# Patient Record
Sex: Female | Born: 1935 | Race: White | Hispanic: No | Smoking: Never smoker
Health system: Southern US, Community
[De-identification: ages and names within clinical notes are randomized; demographics above are authoritative.]

## PROBLEM LIST (undated history)

## (undated) DIAGNOSIS — M25519 Pain in unspecified shoulder: Secondary | ICD-10-CM

## (undated) DIAGNOSIS — G8929 Other chronic pain: Secondary | ICD-10-CM

## (undated) DIAGNOSIS — IMO0001 Reserved for inherently not codable concepts without codable children: Secondary | ICD-10-CM

## (undated) DIAGNOSIS — E039 Hypothyroidism, unspecified: Secondary | ICD-10-CM

## (undated) DIAGNOSIS — K859 Acute pancreatitis without necrosis or infection, unspecified: Secondary | ICD-10-CM

## (undated) DIAGNOSIS — N393 Stress incontinence (female) (male): Secondary | ICD-10-CM

## (undated) DIAGNOSIS — H3530 Unspecified macular degeneration: Secondary | ICD-10-CM

## (undated) DIAGNOSIS — M1990 Unspecified osteoarthritis, unspecified site: Secondary | ICD-10-CM

## (undated) DIAGNOSIS — E785 Hyperlipidemia, unspecified: Secondary | ICD-10-CM

## (undated) DIAGNOSIS — I10 Essential (primary) hypertension: Secondary | ICD-10-CM

## (undated) DIAGNOSIS — M19011 Primary osteoarthritis, right shoulder: Secondary | ICD-10-CM

## (undated) DIAGNOSIS — M1711 Unilateral primary osteoarthritis, right knee: Secondary | ICD-10-CM

## (undated) HISTORY — PX: CATARACT EXTRACTION: SUR2

## (undated) HISTORY — DX: Hyperlipidemia, unspecified: E78.5

## (undated) HISTORY — DX: Hypothyroidism, unspecified: E03.9

## (undated) HISTORY — PX: EYE SURGERY: SHX253

## (undated) HISTORY — PX: OTHER SURGICAL HISTORY: SHX169

## (undated) HISTORY — DX: Acute pancreatitis without necrosis or infection, unspecified: K85.90

## (undated) HISTORY — PX: KNEE ARTHROSCOPY: SUR90

---

## 1998-10-08 ENCOUNTER — Other Ambulatory Visit: Admission: RE | Admit: 1998-10-08 | Discharge: 1998-10-08 | Payer: Self-pay | Admitting: Family Medicine

## 1998-11-29 ENCOUNTER — Emergency Department (HOSPITAL_COMMUNITY): Admission: EM | Admit: 1998-11-29 | Discharge: 1998-11-29 | Payer: Self-pay | Admitting: Emergency Medicine

## 1999-10-15 ENCOUNTER — Other Ambulatory Visit: Admission: RE | Admit: 1999-10-15 | Discharge: 1999-10-15 | Payer: Self-pay | Admitting: Family Medicine

## 2000-06-16 ENCOUNTER — Encounter: Admission: RE | Admit: 2000-06-16 | Discharge: 2000-06-16 | Payer: Self-pay | Admitting: Family Medicine

## 2000-06-16 ENCOUNTER — Encounter: Payer: Self-pay | Admitting: Family Medicine

## 2001-11-15 ENCOUNTER — Other Ambulatory Visit: Admission: RE | Admit: 2001-11-15 | Discharge: 2001-11-15 | Payer: Self-pay | Admitting: Family Medicine

## 2003-09-08 ENCOUNTER — Encounter: Admission: RE | Admit: 2003-09-08 | Discharge: 2003-09-08 | Payer: Self-pay | Admitting: Family Medicine

## 2003-11-23 ENCOUNTER — Other Ambulatory Visit: Admission: RE | Admit: 2003-11-23 | Discharge: 2003-11-23 | Payer: Self-pay | Admitting: Family Medicine

## 2004-02-06 ENCOUNTER — Ambulatory Visit (HOSPITAL_COMMUNITY): Admission: RE | Admit: 2004-02-06 | Discharge: 2004-02-06 | Payer: Self-pay | Admitting: Ophthalmology

## 2004-09-20 ENCOUNTER — Ambulatory Visit (HOSPITAL_COMMUNITY): Admission: RE | Admit: 2004-09-20 | Discharge: 2004-09-20 | Payer: Self-pay | Admitting: Ophthalmology

## 2005-10-04 ENCOUNTER — Inpatient Hospital Stay (HOSPITAL_COMMUNITY): Admission: EM | Admit: 2005-10-04 | Discharge: 2005-10-05 | Payer: Self-pay | Admitting: Emergency Medicine

## 2005-12-04 ENCOUNTER — Other Ambulatory Visit: Admission: RE | Admit: 2005-12-04 | Discharge: 2005-12-04 | Payer: Self-pay | Admitting: Family Medicine

## 2006-02-02 ENCOUNTER — Encounter: Admission: RE | Admit: 2006-02-02 | Discharge: 2006-02-02 | Payer: Self-pay | Admitting: Orthopedic Surgery

## 2006-02-05 ENCOUNTER — Ambulatory Visit (HOSPITAL_BASED_OUTPATIENT_CLINIC_OR_DEPARTMENT_OTHER): Admission: RE | Admit: 2006-02-05 | Discharge: 2006-02-05 | Payer: Self-pay | Admitting: Orthopedic Surgery

## 2006-02-16 ENCOUNTER — Encounter: Admission: RE | Admit: 2006-02-16 | Discharge: 2006-02-16 | Payer: Self-pay | Admitting: Orthopedic Surgery

## 2006-03-05 ENCOUNTER — Ambulatory Visit (HOSPITAL_COMMUNITY): Admission: RE | Admit: 2006-03-05 | Discharge: 2006-03-05 | Payer: Self-pay | Admitting: Urology

## 2006-04-09 ENCOUNTER — Ambulatory Visit (HOSPITAL_BASED_OUTPATIENT_CLINIC_OR_DEPARTMENT_OTHER): Admission: RE | Admit: 2006-04-09 | Discharge: 2006-04-09 | Payer: Self-pay | Admitting: Orthopedic Surgery

## 2008-10-20 ENCOUNTER — Encounter: Admission: RE | Admit: 2008-10-20 | Discharge: 2008-10-20 | Payer: Self-pay | Admitting: Family Medicine

## 2010-12-06 NOTE — H&P (Signed)
Carla Taylor, Carla Taylor                  ACCOUNT NO.:  [id number]   MEDICAL RECORD NO.:  [id number]          PATIENT TYPE:  OIB   LOCATION:  2550                         FACILITY:  [facility]   PHYSICIAN:  Guadelupe Sabin, M.D.DATE OF BIRTH:  [birth year]   DATE OF ADMISSION:  09/20/2004  DATE OF DISCHARGE:                                HISTORY & PHYSICAL   REASON FOR ADMISSION:  This was a planned outpatient readmission of this 77-  year-old, white female admitted for cataract implant surgery of the left  eye.   PRESENT ILLNESS:  This patient has noted deterioration in vision in both  eyes due to progressive cataract formation.  She was previously admitted on  February 06, 2004, for uncomplicated cataract implant surgery of the right eye.  The patient did well following this surgery and now is anticipating similar  surgery of the left eye.  The patient signed an informed consent and  arrangements were made for her outpatient admission at this time.   PAST MEDICAL HISTORY:  The patient is under the care of Dr. Artis Flock and is  felt to be in satisfactory risk for the proposed surgery under local  anesthesia.   CURRENT MEDICATIONS:  Verapamil, Zocor, Cardura, hydrochlorothiazide and  DynaCirc.   REVIEW OF SYSTEMS:  No cardiorespiratory complaints.   PHYSICAL EXAMINATION:  GENERAL:  The patient is a pleasant well-nourished,  well-developed, white female in no acute distress.  VITAL SIGNS:  Blood pressure 147/72, temperature 96.9, heart rate 72,  respirations 18.  HEENT:  EYES:  Visual acuity recorded at 20/40 right eye, 20/70 left eye  with correction.  Applanation tonometry 18 mm each eye. Slit lamp  examination with the eyes white and clear with a clear cornea, deep and  clear anterior chamber.  A clear posterior chamber implant is present in the  right eye and nuclear cataract in the left eye.  Dilated detailed fundus  examination of the right eye shows a clear vitreous, attached retina with  normal optic nerve blood vessels and macula.  The left eye shows cataract  haze with similar clear vitreous, attached retina with normal optic nerve  blood vessels and macula.  CHEST:  Lungs clear to percussion and auscultation.  HEART:  Normal sinus rhythm, no cardiomegaly. No murmurs.  ABDOMEN:  Negative.  EXTREMITIES:  Negative.   ADMISSION DIAGNOSES:  1.  Senile cataract, left eye.  2.  Pseudophakia, right eye.   PLAN:  Cataract implant surgery, left eye.      HNJ/MEDQ  D:  09/20/2004  T:  09/20/2004  Job:  [id number]

## 2010-12-06 NOTE — Op Note (Signed)
NAME:  Carla Taylor, Carla Taylor                  ACCOUNT NO.:  [id number]   MEDICAL RECORD NO.:  [id number]          PATIENT TYPE:  AMB   LOCATION:  DSC                          FACILITY:  [facility]   PHYSICIAN:  Katy Fitch. Sypher, M.D. 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   OPERATION:  Release of left transverse carpal ligament.   OPERATING SURGEON:  Katy Fitch. Sypher, M.D.   ASSISTANT:  Marveen Reeks. Dasnoit, P.A.-C.   ANESTHESIA:  General by LMA.   SUPERVISING ANESTHESIOLOGIST:  Zenon Mayo, MD   INDICATIONS:  Carla Taylor was referred through the courtesy of Dr. Bradd Canary  for evaluation and management of left hand numbness.  Clinical examination  revealed bilateral carpal tunnel syndrome.  She is status post right carpal  tunnel release with very satisfactory result.  She now presents for release  of her left transverse carpal ligament.   PROCEDURE:  Carla Taylor is brought to the operating room and placed in the  supine position on the operating table.   Following the induction of general anesthesia by LMA technique.  The left  arm was prepped with Betadine soap and solution, and sterilely draped.  A  pneumatic tourniquet was applied to the proximal brachium.  Following  exsanguination of the limb with an Esmarch bandage, the arterial tourniquet  was insufflated to 220 mmHg.  The procedure commenced with a short incision  in the line of the ring finger and the palm.  The subcutaneous tissue was  carefully divided along the palmar fascia.  This was split longitudinally  encompassing the comprehensive branch of the median nerve. These were  followed back to the median nerve proper.  The nerve was gently isolated  from the transverse carpal  ligament followed by release of the ligament with  scissors along its ulnar border.   This was widely opened the carpal canal.  No mass or other predicaments were  noted.  Bleeding points along margin of the released ligament were  electrocauterized by bipolar current followed by repair of the skin with  intradermal 3-0 Prolene suture.  A compressive pressure dressing was applied  to the volar plaster splint to maintain the wrist in 5-degrees of  dorsiflexion.      Katy Fitch Sypher, M.D.  Electronically Signed     RVS/MEDQ  D:  04/09/2006  T:  04/11/2006  Job:  [id number]   cc:   Quita Skye. Artis Flock, M.D.

## 2010-12-06 NOTE — Op Note (Signed)
NAMEAMIRRAH, Carla Taylor                  ACCOUNT NO.:  [id number]   MEDICAL RECORD NO.:  [id number]          PATIENT TYPE:  OIB   LOCATION:  2550                         FACILITY:  [facility]   PHYSICIAN:  Guadelupe Sabin, M.D.DATE OF BIRTH:  03-19-1925   DATE OF PROCEDURE:  09/20/2004  DATE OF DISCHARGE:                                 OPERATIVE REPORT   PREOPERATIVE DIAGNOSIS:  Senile nuclear cataract, left eye.   POSTOPERATIVE DIAGNOSIS:  Senile nuclear cataract, left eye.   NAME OF OPERATION:  Planned extracapsular cataract extraction --  phacoemulsification, primary insertion of posterior chamber intraocular lens  implant.   SURGEON:  Guadelupe Sabin, M.D.   ASSISTANT:  Nurse.   ANESTHESIA:  Local 4% Xylocaine, 0.75% Marcaine retrobulbar block with  Wydase added, topical tetracaine, intraocular Xylocaine, anesthesia standby  required in this elderly patient, patient given sodium Pentothal  intravenously during the period of retrobulbar blocking.   OPERATIVE PROCEDURE:  After the patient was prepped and draped, the lid  speculum was inserted in the left eye.  The eye was turned downward and a  superior rectus traction suture placed.  Schiotz tonometry was recorded at 7  scale units with a 5.5-gram weight.  A peritomy was performed adjacent to  the limbus from the 11 to 1 o'clock position.  The corneoscleral junction  was cleaned and a corneoscleral groove made with a 45-degree Superblade.  The anterior chamber was then entered with the 2.5-mm diamond keratome at  the 12 o'clock position and the 15-degree blade at the 2:30 position.  Using  a bent 26-gauge needle on the [place] syringe, a circular capsulorrhexis was  begun and then completed with the Grabow forceps.  Hydrodissection and  hydrodelineation were performed using 1% Xylocaine.  The 30-degree  phacoemulsification tip was then inserted with slow controlled  emulsification of the lens nucleus, total ultrasonic time -- 1  minute 53  seconds, average power level -- 11%, total amount of fluid used  -- 80 mL.  Following removal of the nucleus, the residual cortex was aspirated with the  irrigation-aspiration tip.  The posterior capsule appeared intact with a  brilliant red fundus reflex; it was therefore elected to insert an [employer]er[employer]  medical optics SI40NB silicone three-piece posterior chamber intraocular  lens implant, diopter strength +22.00.  This was inserted with the McDonald  forceps into the anterior chamber and then centered into the capsular bag  using the [facility] lens rotator; the lens appeared to be well-centered.  The  Provisc and [place] which had been used intermittently during the procedures  were aspirated and replaced with balanced salt solution and Miochol  ophthalmic solution.  The operative incisions appeared to be self-sealing;  it was, however, elected to place a single 10-0 interrupted nylon suture  across the 12 o'clock incision to ensure closure and to prevent  endophthalmitis.  Maxitrol ointment was instilled in the conjunctival cul-de-sac and a light  patch and protector shield applied.  Duration of procedure and anesthesia  administration -- 45 minutes.  The patient tolerated the procedure well in  general and  left the operating room for the recovery room in good condition.      HNJ/MEDQ  D:  09/20/2004  T:  09/21/2004  Job:  [id number]

## 2010-12-06 NOTE — Op Note (Signed)
NAME:  Carla Taylor, Carla Taylor                  ACCOUNT NO.:  [id number]   MEDICAL RECORD NO.:  [id number]          PATIENT TYPE:  AMB   LOCATION:  DSC                          FACILITY:  [facility]   PHYSICIAN:  Katy Fitch. Sypher, M.D. DATE OF BIRTH:  June 25, 1925   DATE OF PROCEDURE:  02/05/2006  DATE OF DISCHARGE:                                 OPERATIVE REPORT   PREOPERATIVE DIAGNOSIS:  Entrapment neuropathy, right median nerve at carpal  tunnel, right wrist.   POSTOPERATIVE DIAGNOSIS:  Entrapment neuropathy, right median nerve at  carpal tunnel, right wrist.   OPERATION:  Release of right transverse carpal ligament.   OPERATING SURGEON:  Dr. Josephine Igo.   ASSISTANT:  Annye Rusk, PA-C.   ANESTHESIA:  General by LMA, supervising anesthesiologist is Dr. Gelene Mink.   INDICATIONS:  Carla Taylor is a 77 year old woman referred for evaluation and  management of chronic right hand numbness and arthritis symptoms.  Clinical  examination revealed signs of severe right carpal tunnel syndrome.  Electrodiagnostic studies were obtained which confirmed significant right  median neuropathy at wrist level.   Due to her failure to respond to nonoperative measures, she is brought to  the operating room at this time for release of her right transverse carpal  ligament.   PROCEDURE:  Carla Taylor is brought to the operating room and placed in supine  position upon the operating table.   Following induction general anesthesia, under Dr. Thornton Dales supervision,  the right arm was prepped Betadine soap and solution, and sterilely draped.  A pneumatic tourniquet was applied to the proximal right brachium.  Following exsanguination of the right arm with an Esmarch bandage, the  arterial tourniquet was inflated to 220 mmHg.  The procedure commenced with  a short incision in the line of the ring finger and the palm.  Subcutaneous  tissues were carefully divided revealing the palmar fascia.  The fascia was  split longitudinally, revealing the common sensory branch of the median  nerve.  These were followed back to the median nerve proper.  After  isolation of the nerve from the transverse carpal ligament, the ligament was  released along its ulnar border extending into the distal forearm.  This  widely opened the carpal canal.  No mass or other predicaments were noted.  Bleeding points along  the margin of the released ligament were electrocauterized with bipolar  current, followed by repair the skin with intradermal through a Prolene  suture.   A compressive dressing was applied with a volar plaster splint, maintaining  the wrist in 5 degrees of dorsiflexion.      Katy Fitch Sypher, M.D.  Electronically Signed     RVS/MEDQ  D:  02/05/2006  T:  02/05/2006  Job:  [id number]   cc:   Katy Fitch. Sypher, M.D.  Fax: [phone]

## 2010-12-06 NOTE — H&P (Signed)
Carla Taylor, Carla Taylor                  ACCOUNT NO.:  [id number]   MEDICAL RECORD NO.:  [id number]          PATIENT TYPE:  INP   LOCATION:  1417                         FACILITY:  [facility]   PHYSICIAN:  Hillery Aldo, M.D.   DATE OF BIRTH:  [birth year]   DATE OF ADMISSION:  10/04/2005  DATE OF DISCHARGE:  10/05/2005                                HISTORY & PHYSICAL   PRIMARY CARE PHYSICIAN:  Dr. Bradd Canary   CHIEF COMPLAINT:  Chest pain.   HISTORY OF PRESENT ILLNESS:  The patient is a 77 year old female with no  past medical history of coronary artery disease who was awakened from sleep  suddenly at 3 a.m. with epigastric discomfort and chest pain. The patient  reported that the pain increased with inspiration. She describes it is  nonradiating. She took an aspirin at that time and provided enough relief  for her to get back to sleep. She still had chest pain this morning and  therefore came to the emergency department for further evaluation and  workup. She was given aspirin and nitroglycerin in the emergency department  and again her pain was relieved. She has no prior history of similar  symptoms. There was some mild nausea early this morning but no associated  vomiting or diaphoresis. She reports the pain was a 6-7 when it was at its  worst. She is currently chest-pain-free.   PAST MEDICAL HISTORY:  1.  Hypertension.  2.  Hyperlipidemia.  3.  Status post bilateral cataract surgeries.  4.  Hypothyroidism, recently diagnosed.  5.  Overactive bladder.   FAMILY HISTORY:  The patient's mother had a stroke at age 81 but lived into  her 54s and died of old age. Father died in his 5s from stroke. There is a  remote history of MI in a maternal grandfather. She has three siblings who  are in reasonably good health. She has four offspring, one who died of  complications of HIV.   SOCIAL HISTORY:  The patient is widowed. She lives alone. She describes her  lifestyle as being active.  Denies any past medical history of tobacco,  alcohol or drug abuse. She is retired from [employer] work.   ALLERGIES:  None.   CURRENT MEDICATIONS:  1.  Os-Cal 500 mg b.i.d.  2.  Zocor 10 mg daily.  3.  Verapamil 240 mg daily.  4.  Hydrochlorothiazide 12.5 mg daily.  5.  Oxytrol patch 3.9 mg/24 hours daily.  6.  Synthroid 50 mcg daily.  7.  DynaCirc 5 mg daily.  8.  Aspirin 81 mg daily.  9.  Doxazosin 2 mg daily.  10. Allegra 180 mg daily p.r.n.   REVIEW OF SYSTEMS:  The patient denies any fever, chills or weight loss. She  has longstanding dyspnea on exertion that has not changed recently. No  cough. Denies dysuria. She has no changes in her bowel habits, melena or  hematochezia. She does not currently complain of musculoskeletal problems  but states she is in a study program for osteoarthritis. She gets a study-  related medication but does not  know what this is. The medication recently  entered phase 2 trials and she is unsure if her dosage was adjusted at that  time.   PHYSICAL EXAMINATION:  VITAL SIGNS:  Temperature 98.3, pulse 77,  respirations 20, blood pressure 142/73, O2 saturation 98% on room air.  GENERAL:  Well-developed, well-nourished female in no acute distress.  HEENT:  Normocephalic, atraumatic. PERRL. EOMI. Oropharynx is clear.  NECK:  Supple, no thyromegaly, no lymphadenopathy, no jugular venous  distension.  CHEST:  Lungs clear to auscultation bilaterally with good air movement.  HEART:  Regular rate and rhythm. No murmurs, rubs, or gallops.  ABDOMEN:  Soft, nontender, nondistended with normoactive bowel sounds.  EXTREMITIES:  No clubbing, edema, or cyanosis.  SKIN:  Warm and dry. No rashes.  NEUROLOGIC:  The patient moves all extremities x4 with equal strength.  Cranial nerves II-XII grossly intact. Nonfocal.   DATA REVIEW:  EKG shows normal sinus rhythm with no ST or T-wave changes.   Chest x-ray shows cardiomegaly without CHF. There is no active   cardiopulmonary disease.   Laboratory data reveals sodium 139, potassium 3.7, chloride 106, bicarb 24,  BUN 23, creatinine 1.0, glucose 101, calcium 9.5, total protein 7.6, AST 22,  ALT 23, alkaline phosphatase 84, total bilirubin 0.8, magnesium 2.2. Coags  are normal with a PT of 13.7 and a PTT of 32. White blood cell count is 14.7  with an absolute neutrophil count of 12.5. Hemoglobin is 14.1, hematocrit  43.1 and platelets 227. Cardiac point-of-care markers are negative x3.  Urinalysis reveals pyuria with 11-20 white blood cells and 3-6 red blood  cells. There is some mucus and calcium oxalate crystals present as well.   ASSESSMENT AND PLAN:  1.  Chest/epigastric pain:  The patient's HPI, given her recent study-      related program for osteoarthritis, is suggestive of possible      gastroesophageal reflux disease. I suspect this study medication may be      an NSAID derivative. She may have some associated esophageal spasm which      is why the nitroglycerin may have eased off her pain. Will go ahead and      give the patient a GI cocktail and put her on a proton pump inhibitor.      She does have some cardiac risk factors although her hypertension and      hyperlipidemia are controlled with medications. She does not have any      immediate family history of coronary disease. She has no history of      tobacco abuse. The pain is very atypical. Given her chest x-ray findings      cardiomegaly, however, I will obtain a 2-D echocardiogram and cycle      enzymes q.8h. x3. We will also get a serial EKG in the morning. Given      the association with pain with deep inspiration, she could have an early      pneumonia that is not showing up on the chest x-ray. She does not have      cough and has been afebrile, however. This is not likely representative      of pulmonary embolus as she is active, has no tachycardia and no      worsening dyspnea. I will check a D-dimer, however, and if it is      positive consider a CT scan of the chest. To rather further risk-      stratify her, I will  check a fasting lipid panel and continue her statin      therapy. Also check a TSH.  2.  Hypothyroidism:  The patient was recently started on Synthroid. Again,      we will check a TSH to ensure that this dosage is appropriate for her.  3.  Hypertension:  The patient's blood pressure fairly well controlled on      her current regimen. We will continue medications. If she has any      evidence of coronary disease, however, I would switch one of her      medications to a beta blocker.  4.  Leukocytosis:  Unclear etiology. This could be an early pneumonia that      has not showed up on chest x-ray or could be a urinary tract infection      that is asymptomatic. Given that she does have pyuria, I will start her      on Cipro 250 mg p.o. b.i.d. If she develops any cough or increase in      sputum production I would have a low threshold to changing her      antibiotic regimen to include respiratory pathogens.  5.  Prophylaxis:  Will start Lovenox for DVT prophylaxis and use a proton      pump inhibitor for both treatment and prophylaxis of her GI discomfort.           ______________________________  Hillery Aldo, M.D.     CR/MEDQ  D:  10/04/2005  T:  10/06/2005  Job:  [id number]   cc:   Quita Skye. Artis Flock, M.D.  Fax: [phone]

## 2010-12-06 NOTE — Op Note (Signed)
Carla Taylor, Carla Taylor                            ACCOUNT NO.:  [id number]   MEDICAL RECORD NO.:  [id number]                   PATIENT TYPE:  OIB   LOCATION:  2899                                 FACILITY:  [facility]   PHYSICIAN:  Guadelupe Sabin, M.D.             DATE OF BIRTH:  Dec 26, 1924   DATE OF PROCEDURE:  02/06/2004  DATE OF DISCHARGE:                                 OPERATIVE REPORT   PREOPERATIVE DIAGNOSIS:  Senile nuclear cataract, right eye.   POSTOPERATIVE DIAGNOSIS:  Senile nuclear cataract, right eye.   OPERATION:  Planned extracapsular cataract extraction, phacoemulsification,  primary insertion of posterior chamber intraocular lens implant.   SURGEON:  Guadelupe Sabin, M.D.   ASSISTANT:  Nurse.   ANESTHESIA:  4% local Xylocaine, 0.75% Marcaine retrobulbar block with  Wydase added, topical Tetracaine, intraocular Xylocaine.  Anesthesia standby  required in this elderly patient.  Patient given sodium pentothal  intravenously during the period of retrobulbar injection.   OPERATIVE PROCEDURE:  After the patient was prepped and draped, a lid  speculum was inserted in the right eye.  Schiotz tonometry was recorded at 7  scale units with a 5.5 g weight.  A peritomy was performed adjacent to the  limbus from the 11 o 1 o'clock position.  The corneoscleral junction was  cleaned and a corneoscleral groove made with a 45 degree Superblade.  The  anterior chamber was then entered with the 2.5 mm diamond keratome at the 12  o'clock position and the 12 degree blade at the 2:30 position.  Using a bent  26-gauge needle on a [place]alon syringe, a circular capsulorhexis was begun and  then completed with the Grabow forceps.  Hydrodissection and  hydrodelineation were performed using 1% Xylocaine.  The 30 degree  phacoemulsification tip was then inserted with slow, controlled  emulsification of the lens nucleus.  Total ultrasonic time 56 seconds,  average power level 15%, total amount of  fluid used 95 mL.  Following  removal of the nucleus, the residual cortex was aspirated with the  irrigation-aspiration tip.  The posterior capsule appeared intact with  slight posterior subcapsular change which was scrubbed from the posterior  capsule surface.  It was then elected to insert an [employer]  SI40NB silicone three-piece posterior chamber intraocular lens implant,  diopter strength +22.00.  This was inserted with the McDonald forceps into  the anterior chamber and then centered into the capsular bag using the  [facility] lens rotator.  The lens appeared to be well-centered.  The [place] and  [place] which had been used intermittently during the procedure was  aspirated and replaced with balanced salt solution and Miochol ophthalmic  solution.  The operative incisions appeared to be self-sealing.  It was,  however, elected to place a single 10-0 interrupted nylon suture over the 12  o'clock incision to ensure closure and to prevent endophthalmitis.  Duration  of procedure and anesthesia administration 45 minutes.  The patient  tolerated the procedure well in general, left the operating room for the  recovery room in good condition.                                               Guadelupe Sabin, M.D.    HNJ/MEDQ  D:  02/06/2004  T:  02/06/2004  Job:  [id number]

## 2010-12-06 NOTE — H&P (Signed)
NAMEMEKLIT, COTTA                            ACCOUNT NO.:  [id number]   MEDICAL RECORD NO.:  [id number]                   PATIENT TYPE:  OIB   LOCATION:  2899                                 FACILITY:  [facility]   PHYSICIAN:  Guadelupe Sabin, M.D.             DATE OF BIRTH:  [birth year]   DATE OF ADMISSION:  02/06/2004  DATE OF DISCHARGE:                                HISTORY & PHYSICAL   This was a planned outpatient surgical admission of this 77 year old white  female admitted for cataract implant surgery of the right eye.   HISTORY OF PRESENT ILLNESS:  This patient has been followed in my office  since March of 1995 for routine eye care.  Over the ensuing years, gradual  cataract formation has developed in both eyes noted in 1992.  Since that  time, there has been deterioration of vision and when seen on January 09, 2004,  vision was 20/70 right eye, 20/40 left eye with correction.  The patient was  experiencing difficulty with reading intermittently and a filmlike sensation  over both eyes.  Examination revealed progression in her nuclear cataracts.  It was elected to proceed with cataract implant surgery of the right eye  now, left eye later.  The patient was given oral discussion and printed  information concerning the procedure and its complications.  She signed and  informed consent and arrangements were made for her outpatient admission at  this time.   PAST MEDICAL HISTORY:  The patient is under the care of Dr. __________ and  is felt to be in satisfactory condition for the proposed surgery.  The  patient takes multiple medications including verapamil, Zocor, Cardura,  hydrochlorothiazide, Celebrex, and DynaCirc 5 mg.  She is felt to be an  average risk for her age for the proposed surgery.   REVIEW OF SYMPTOMS:  No cardiorespiratory complaints.   PHYSICAL EXAMINATION:  GENERAL APPEARANCE:  The patient is a pleasant well-  developed, well-nourished 77 year old white female  in no acute ocular  distress.  VITAL SIGNS:  As recorded on admission, blood pressure 137/72, pulse 67,  respiratory rate 16, temperature 96.4.  HEENT:  Eyes:  Ocular examination as noted above.  Slit lamp examination  reveals a clear cornea, deep and clear anterior chamber and nuclear cataract  formation in both eyes.  Dilated fundus examination shows cataract haze,  otherwise a clear vitreous, attached retina with normal optic nerve, blood  vessels and macula.  Applanation tonometry 16 mm right eye, 14 left eye.  CHEST:  Lungs clear to percussion and auscultation.  CARDIOVASCULAR:  Heart with normal sinus rhythm, no cardiomegaly and no  murmurs.  ABDOMEN:  Negative.  EXTREMITIES:  Negative.   ADMISSION DIAGNOSIS:  Senile nuclear cataract both eyes.   PLAN:  Cataract implant surgery right eye now, left eye later.  Guadelupe Sabin, M.D.   HNJ/MEDQ  D:  02/06/2004  T:  02/06/2004  Job:  [id number]

## 2010-12-06 NOTE — Discharge Summary (Signed)
Carla Taylor, Carla Taylor                  ACCOUNT NO.:  [id number]   MEDICAL RECORD NO.:  [id number]          PATIENT TYPE:  INP   LOCATION:  1417                         FACILITY:  [facility]   PHYSICIAN:  Hillery Aldo, M.D.   DATE OF BIRTH:  10/25/1924   DATE OF ADMISSION:  10/04/2005  DATE OF DISCHARGE:  10/05/2005                                 DISCHARGE SUMMARY   PRIMARY CARE PHYSICIAN:  Quita Skye. Artis Flock, M.D.   DISCHARGE DIAGNOSES:  1.  Chest/epigastric pain, noncardiac.  2.  Pyuria status post empiric treatment with Cipro for a urinary tract      infection, cultures pending.  3.  Hypothyroidism.  4.  Hypertension, controlled.  5.  Hyperlipidemia, treated  6.  Overactive bladder.   DISCHARGE MEDICATIONS:  1.  Os-Cal 500 mg b.i.d.  2.  Zocor 10 mg daily.  3.  Verapamil 240 mg daily.  4.  Hydrochlorothiazide 12.5 mg daily.  5.  Oxytrol patches as corrected.  6.  Synthroid 50 mcg daily.  7.  DynaCirc 5 mg daily.  8.  Aspirin 81 mg daily.  9.  Doxazosin 2 mg daily.  10. Allegra 180 mg daily.  11. Protonix 40 mg daily.  12. Cipro 250 mg b.i.d. x3 days.   CONSULTATIONS:  None.   BRIEF ADMISSION AND HPI:  The patient is a 77 year old female who came to  the emergency department secondary to atypical chest pain that awakened her  from sleep at 3 o'clock in the morning on the date of admission. Pain was  atypical in that it had pleuritic qualities with increase in respirations,  was nonradiating, was not associated with vomiting or diaphoresis. Also of  note, the patient has been enrolled in a study medication program for  treatment of osteoarthritis. No similar history of pain in the past.   PROCEDURES AND DIAGNOSTIC STUDIES:  Chest x-ray on October 04, 2005 showed  cardiomegaly with no evidence of CHF and no active cardiopulmonary disease.   DISCHARGE LABORATORY VALUES:  Sodium was 141, potassium 3.7, chloride 108,  bicarb 27, BUN 18, creatinine 1.0, glucose 106. Lipase was 34.  TSH was  1.6660. White blood cell count was 10, hemoglobin 12.2, hematocrit 36.1 and  platelets 196. Cardiac markers were negative.   HOSPITAL COURSE:  #1.  CHEST/EPIGASTRIC PAIN:  The patient's point of care  markers were negative in the emergency department and two sets of cardiac  markers were negative on the floor. Her serial EKG showed normal sinus  rhythm with no ST or T-wave abnormalities. She did receive a GI cocktail and  has been pain free since that time. She slept well. Although she has three  cardiac risk factors being her age, her history of hypertension, and history  of hyperlipidemia, her hypertension and hyperlipidemia have been controlled  with medications. She is deemed to be a low risk for an acute coronary  syndrome. She remains active at home so pulmonary embolism is not likely.  She is deemed stable for discharge and was instructed to follow up with Dr.  Artis Flock early next week  for consideration of an outpatient stress test or 2-D  echocardiogram. She is further instructed to return to the emergency  department if she develops any further chest pain or shortness of breath.  #2.  PYURIA:  The patient was empirically put on ciprofloxacin 250 mg b.i.d.  At the time of this dictation, her urine cultures were pending. Her white  blood cell count is decreased on empiric treatment. We will treat her for 3  days given the fact that she is not diabetic.  #3.  HYPOTHYROIDISM:  The patient's TSH was checked and she was found to be  appropriately replaced on her 50 mcg dose of Synthroid daily.  #4.  HYPERTENSION:  The patient's hypertension is well controlled on her  home regimen. These medications were continued while she was in the  hospital.  #5.  HYPERLIPIDEMIA:  The patient is on a statin. A fasting lipid panel was  checked but is still pending at the time of this dictation.   DISPOSITION:  The patient is stable for discharge home with close follow-up.  She should contact  her primary care physician, Dr. Artis Flock on Monday to  schedule an appointment to be seen and for consideration of an outpatient  stress test or 2-D echocardiogram. She is instructed to eat a low-salt heart  healthy diet and to increase her activity slowly. She is to return to the  emergency department for any return of symptoms.   CONDITION ON DISCHARGE:  Improved           ______________________________  Hillery Aldo, M.D.     CR/MEDQ  D:  10/05/2005  T:  10/06/2005  Job:  [id number]   cc:   Quita Skye. Artis Flock, M.D.  Fax: [phone]

## 2011-08-15 DIAGNOSIS — M1710 Unilateral primary osteoarthritis, unspecified knee: Secondary | ICD-10-CM | POA: Diagnosis not present

## 2011-09-18 DIAGNOSIS — M2240 Chondromalacia patellae, unspecified knee: Secondary | ICD-10-CM | POA: Diagnosis not present

## 2011-10-17 DIAGNOSIS — H35369 Drusen (degenerative) of macula, unspecified eye: Secondary | ICD-10-CM | POA: Insufficient documentation

## 2011-10-17 DIAGNOSIS — H3530 Unspecified macular degeneration: Secondary | ICD-10-CM | POA: Insufficient documentation

## 2011-10-17 DIAGNOSIS — H353190 Nonexudative age-related macular degeneration, unspecified eye, stage unspecified: Secondary | ICD-10-CM | POA: Diagnosis not present

## 2011-11-12 DIAGNOSIS — Z1231 Encounter for screening mammogram for malignant neoplasm of breast: Secondary | ICD-10-CM | POA: Diagnosis not present

## 2011-11-19 DIAGNOSIS — H2640 Unspecified secondary cataract: Secondary | ICD-10-CM | POA: Diagnosis not present

## 2011-12-12 DIAGNOSIS — R109 Unspecified abdominal pain: Secondary | ICD-10-CM | POA: Diagnosis not present

## 2011-12-12 DIAGNOSIS — R1013 Epigastric pain: Secondary | ICD-10-CM | POA: Diagnosis not present

## 2012-02-24 DIAGNOSIS — E559 Vitamin D deficiency, unspecified: Secondary | ICD-10-CM | POA: Diagnosis not present

## 2012-02-24 DIAGNOSIS — E782 Mixed hyperlipidemia: Secondary | ICD-10-CM | POA: Diagnosis not present

## 2012-02-24 DIAGNOSIS — E039 Hypothyroidism, unspecified: Secondary | ICD-10-CM | POA: Diagnosis not present

## 2012-02-24 DIAGNOSIS — I10 Essential (primary) hypertension: Secondary | ICD-10-CM | POA: Diagnosis not present

## 2012-02-24 DIAGNOSIS — N189 Chronic kidney disease, unspecified: Secondary | ICD-10-CM | POA: Diagnosis not present

## 2012-02-24 DIAGNOSIS — M1990 Unspecified osteoarthritis, unspecified site: Secondary | ICD-10-CM | POA: Diagnosis not present

## 2012-03-18 ENCOUNTER — Other Ambulatory Visit: Payer: Self-pay | Admitting: Family Medicine

## 2012-03-18 ENCOUNTER — Ambulatory Visit
Admission: RE | Admit: 2012-03-18 | Discharge: 2012-03-18 | Disposition: A | Discharge status: Home / Self Care | Payer: Medicare Other | Source: Ambulatory Visit | Attending: Family Medicine | Admitting: Family Medicine

## 2012-03-18 DIAGNOSIS — W19XXXA Unspecified fall, initial encounter: Secondary | ICD-10-CM

## 2012-03-18 DIAGNOSIS — S63509A Unspecified sprain of unspecified wrist, initial encounter: Secondary | ICD-10-CM | POA: Diagnosis not present

## 2012-03-18 DIAGNOSIS — Z043 Encounter for examination and observation following other accident: Secondary | ICD-10-CM | POA: Diagnosis not present

## 2012-03-18 DIAGNOSIS — M25539 Pain in unspecified wrist: Secondary | ICD-10-CM | POA: Diagnosis not present

## 2012-03-18 DIAGNOSIS — E039 Hypothyroidism, unspecified: Secondary | ICD-10-CM | POA: Diagnosis not present

## 2012-03-18 DIAGNOSIS — N189 Chronic kidney disease, unspecified: Secondary | ICD-10-CM | POA: Diagnosis not present

## 2012-03-18 DIAGNOSIS — E782 Mixed hyperlipidemia: Secondary | ICD-10-CM | POA: Diagnosis not present

## 2012-03-18 DIAGNOSIS — S5290XA Unspecified fracture of unspecified forearm, initial encounter for closed fracture: Secondary | ICD-10-CM | POA: Diagnosis not present

## 2012-04-02 DIAGNOSIS — M25539 Pain in unspecified wrist: Secondary | ICD-10-CM | POA: Diagnosis not present

## 2012-05-05 DIAGNOSIS — Z23 Encounter for immunization: Secondary | ICD-10-CM | POA: Diagnosis not present

## 2012-05-28 DIAGNOSIS — M1710 Unilateral primary osteoarthritis, unspecified knee: Secondary | ICD-10-CM | POA: Diagnosis not present

## 2012-05-28 DIAGNOSIS — IMO0002 Reserved for concepts with insufficient information to code with codable children: Secondary | ICD-10-CM | POA: Diagnosis not present

## 2012-06-02 DIAGNOSIS — M1710 Unilateral primary osteoarthritis, unspecified knee: Secondary | ICD-10-CM | POA: Diagnosis not present

## 2012-06-09 DIAGNOSIS — M1710 Unilateral primary osteoarthritis, unspecified knee: Secondary | ICD-10-CM | POA: Diagnosis not present

## 2012-06-16 DIAGNOSIS — M1710 Unilateral primary osteoarthritis, unspecified knee: Secondary | ICD-10-CM | POA: Diagnosis not present

## 2012-07-19 DIAGNOSIS — R350 Frequency of micturition: Secondary | ICD-10-CM | POA: Diagnosis not present

## 2012-07-19 DIAGNOSIS — N3946 Mixed incontinence: Secondary | ICD-10-CM | POA: Diagnosis not present

## 2012-08-03 DIAGNOSIS — H2640 Unspecified secondary cataract: Secondary | ICD-10-CM | POA: Diagnosis not present

## 2012-08-03 DIAGNOSIS — H35369 Drusen (degenerative) of macula, unspecified eye: Secondary | ICD-10-CM | POA: Diagnosis not present

## 2012-08-03 DIAGNOSIS — H3530 Unspecified macular degeneration: Secondary | ICD-10-CM | POA: Diagnosis not present

## 2012-11-15 DIAGNOSIS — Z1231 Encounter for screening mammogram for malignant neoplasm of breast: Secondary | ICD-10-CM | POA: Diagnosis not present

## 2012-11-24 DIAGNOSIS — M1990 Unspecified osteoarthritis, unspecified site: Secondary | ICD-10-CM | POA: Diagnosis not present

## 2012-11-24 DIAGNOSIS — M19039 Primary osteoarthritis, unspecified wrist: Secondary | ICD-10-CM | POA: Diagnosis not present

## 2012-11-24 DIAGNOSIS — E039 Hypothyroidism, unspecified: Secondary | ICD-10-CM | POA: Diagnosis not present

## 2012-11-24 DIAGNOSIS — L570 Actinic keratosis: Secondary | ICD-10-CM | POA: Diagnosis not present

## 2013-02-18 DIAGNOSIS — H2640 Unspecified secondary cataract: Secondary | ICD-10-CM | POA: Diagnosis not present

## 2013-02-18 DIAGNOSIS — H3530 Unspecified macular degeneration: Secondary | ICD-10-CM | POA: Diagnosis not present

## 2013-02-18 DIAGNOSIS — H35369 Drusen (degenerative) of macula, unspecified eye: Secondary | ICD-10-CM | POA: Diagnosis not present

## 2013-02-24 DIAGNOSIS — E782 Mixed hyperlipidemia: Secondary | ICD-10-CM | POA: Diagnosis not present

## 2013-02-24 DIAGNOSIS — E538 Deficiency of other specified B group vitamins: Secondary | ICD-10-CM | POA: Diagnosis not present

## 2013-02-24 DIAGNOSIS — E559 Vitamin D deficiency, unspecified: Secondary | ICD-10-CM | POA: Diagnosis not present

## 2013-02-24 DIAGNOSIS — M1990 Unspecified osteoarthritis, unspecified site: Secondary | ICD-10-CM | POA: Diagnosis not present

## 2013-02-24 DIAGNOSIS — N189 Chronic kidney disease, unspecified: Secondary | ICD-10-CM | POA: Diagnosis not present

## 2013-02-24 DIAGNOSIS — M5126 Other intervertebral disc displacement, lumbar region: Secondary | ICD-10-CM | POA: Diagnosis not present

## 2013-02-24 DIAGNOSIS — L570 Actinic keratosis: Secondary | ICD-10-CM | POA: Diagnosis not present

## 2013-05-04 DIAGNOSIS — Z23 Encounter for immunization: Secondary | ICD-10-CM | POA: Diagnosis not present

## 2013-06-20 DIAGNOSIS — J13 Pneumonia due to Streptococcus pneumoniae: Secondary | ICD-10-CM | POA: Diagnosis not present

## 2013-06-20 DIAGNOSIS — N189 Chronic kidney disease, unspecified: Secondary | ICD-10-CM | POA: Diagnosis not present

## 2013-06-20 DIAGNOSIS — I10 Essential (primary) hypertension: Secondary | ICD-10-CM | POA: Diagnosis not present

## 2013-06-23 DIAGNOSIS — N189 Chronic kidney disease, unspecified: Secondary | ICD-10-CM | POA: Diagnosis not present

## 2013-06-23 DIAGNOSIS — S5290XA Unspecified fracture of unspecified forearm, initial encounter for closed fracture: Secondary | ICD-10-CM | POA: Diagnosis not present

## 2013-06-23 DIAGNOSIS — M1990 Unspecified osteoarthritis, unspecified site: Secondary | ICD-10-CM | POA: Diagnosis not present

## 2013-06-23 DIAGNOSIS — M19039 Primary osteoarthritis, unspecified wrist: Secondary | ICD-10-CM | POA: Diagnosis not present

## 2013-06-23 DIAGNOSIS — E782 Mixed hyperlipidemia: Secondary | ICD-10-CM | POA: Diagnosis not present

## 2013-06-23 DIAGNOSIS — I10 Essential (primary) hypertension: Secondary | ICD-10-CM | POA: Diagnosis not present

## 2013-07-20 DIAGNOSIS — R32 Unspecified urinary incontinence: Secondary | ICD-10-CM | POA: Diagnosis not present

## 2013-07-20 DIAGNOSIS — N3944 Nocturnal enuresis: Secondary | ICD-10-CM | POA: Diagnosis not present

## 2013-08-15 DIAGNOSIS — M545 Low back pain, unspecified: Secondary | ICD-10-CM | POA: Diagnosis not present

## 2013-08-15 DIAGNOSIS — M1610 Unilateral primary osteoarthritis, unspecified hip: Secondary | ICD-10-CM | POA: Diagnosis not present

## 2013-08-15 DIAGNOSIS — M25519 Pain in unspecified shoulder: Secondary | ICD-10-CM | POA: Diagnosis not present

## 2013-08-15 DIAGNOSIS — M1710 Unilateral primary osteoarthritis, unspecified knee: Secondary | ICD-10-CM | POA: Diagnosis not present

## 2013-08-15 DIAGNOSIS — M169 Osteoarthritis of hip, unspecified: Secondary | ICD-10-CM | POA: Diagnosis not present

## 2013-08-22 DIAGNOSIS — M1710 Unilateral primary osteoarthritis, unspecified knee: Secondary | ICD-10-CM | POA: Diagnosis not present

## 2013-08-23 DIAGNOSIS — H3530 Unspecified macular degeneration: Secondary | ICD-10-CM | POA: Diagnosis not present

## 2013-08-23 DIAGNOSIS — H2640 Unspecified secondary cataract: Secondary | ICD-10-CM | POA: Diagnosis not present

## 2013-08-23 DIAGNOSIS — H35369 Drusen (degenerative) of macula, unspecified eye: Secondary | ICD-10-CM | POA: Diagnosis not present

## 2013-08-29 DIAGNOSIS — M1710 Unilateral primary osteoarthritis, unspecified knee: Secondary | ICD-10-CM | POA: Diagnosis not present

## 2013-10-18 DIAGNOSIS — M67919 Unspecified disorder of synovium and tendon, unspecified shoulder: Secondary | ICD-10-CM | POA: Diagnosis not present

## 2013-11-16 DIAGNOSIS — Z1231 Encounter for screening mammogram for malignant neoplasm of breast: Secondary | ICD-10-CM | POA: Diagnosis not present

## 2013-12-27 DIAGNOSIS — H3530 Unspecified macular degeneration: Secondary | ICD-10-CM | POA: Diagnosis not present

## 2013-12-27 DIAGNOSIS — H35369 Drusen (degenerative) of macula, unspecified eye: Secondary | ICD-10-CM | POA: Diagnosis not present

## 2013-12-27 DIAGNOSIS — M5030 Other cervical disc degeneration, unspecified cervical region: Secondary | ICD-10-CM | POA: Diagnosis not present

## 2013-12-27 DIAGNOSIS — H2640 Unspecified secondary cataract: Secondary | ICD-10-CM | POA: Diagnosis not present

## 2014-01-10 DIAGNOSIS — M719 Bursopathy, unspecified: Secondary | ICD-10-CM | POA: Diagnosis not present

## 2014-01-10 DIAGNOSIS — M67919 Unspecified disorder of synovium and tendon, unspecified shoulder: Secondary | ICD-10-CM | POA: Diagnosis not present

## 2014-01-17 DIAGNOSIS — M19019 Primary osteoarthritis, unspecified shoulder: Secondary | ICD-10-CM | POA: Diagnosis not present

## 2014-01-19 DIAGNOSIS — M19019 Primary osteoarthritis, unspecified shoulder: Secondary | ICD-10-CM | POA: Diagnosis not present

## 2014-01-23 ENCOUNTER — Ambulatory Visit
Admission: RE | Admit: 2014-01-23 | Discharge: 2014-01-23 | Disposition: A | Discharge status: Home / Self Care | Payer: Medicare Other | Source: Ambulatory Visit | Attending: Orthopedic Surgery | Admitting: Orthopedic Surgery

## 2014-01-23 ENCOUNTER — Other Ambulatory Visit: Payer: Self-pay | Admitting: Orthopedic Surgery

## 2014-01-23 DIAGNOSIS — M25511 Pain in right shoulder: Secondary | ICD-10-CM

## 2014-01-23 DIAGNOSIS — M1710 Unilateral primary osteoarthritis, unspecified knee: Secondary | ICD-10-CM | POA: Diagnosis not present

## 2014-01-23 DIAGNOSIS — M19019 Primary osteoarthritis, unspecified shoulder: Secondary | ICD-10-CM | POA: Diagnosis not present

## 2014-01-28 ENCOUNTER — Other Ambulatory Visit: Payer: Self-pay | Admitting: Orthopedic Surgery

## 2014-02-01 DIAGNOSIS — E782 Mixed hyperlipidemia: Secondary | ICD-10-CM | POA: Diagnosis not present

## 2014-02-01 DIAGNOSIS — N189 Chronic kidney disease, unspecified: Secondary | ICD-10-CM | POA: Diagnosis not present

## 2014-02-01 DIAGNOSIS — I10 Essential (primary) hypertension: Secondary | ICD-10-CM | POA: Diagnosis not present

## 2014-02-01 DIAGNOSIS — M7530 Calcific tendinitis of unspecified shoulder: Secondary | ICD-10-CM | POA: Diagnosis not present

## 2014-02-01 DIAGNOSIS — L570 Actinic keratosis: Secondary | ICD-10-CM | POA: Diagnosis not present

## 2014-02-08 DIAGNOSIS — N189 Chronic kidney disease, unspecified: Secondary | ICD-10-CM | POA: Diagnosis not present

## 2014-02-08 DIAGNOSIS — M5126 Other intervertebral disc displacement, lumbar region: Secondary | ICD-10-CM | POA: Diagnosis not present

## 2014-02-08 DIAGNOSIS — I10 Essential (primary) hypertension: Secondary | ICD-10-CM | POA: Diagnosis not present

## 2014-02-08 DIAGNOSIS — M25519 Pain in unspecified shoulder: Secondary | ICD-10-CM | POA: Diagnosis not present

## 2014-02-08 DIAGNOSIS — M11819 Other specified crystal arthropathies, unspecified shoulder: Secondary | ICD-10-CM | POA: Diagnosis not present

## 2014-02-23 ENCOUNTER — Other Ambulatory Visit (HOSPITAL_COMMUNITY): Payer: Medicare Other

## 2014-03-01 DIAGNOSIS — M1710 Unilateral primary osteoarthritis, unspecified knee: Secondary | ICD-10-CM | POA: Diagnosis not present

## 2014-03-07 ENCOUNTER — Encounter (HOSPITAL_COMMUNITY): Admission: RE | Payer: Self-pay | Source: Ambulatory Visit

## 2014-03-07 ENCOUNTER — Inpatient Hospital Stay (HOSPITAL_COMMUNITY): Admission: RE | Admit: 2014-03-07 | Payer: Medicare Other | Source: Ambulatory Visit | Admitting: Orthopedic Surgery

## 2014-03-07 SURGERY — ARTHROPLASTY, SHOULDER, TOTAL
Anesthesia: General | Site: Shoulder | Laterality: Right

## 2014-03-08 DIAGNOSIS — M1710 Unilateral primary osteoarthritis, unspecified knee: Secondary | ICD-10-CM | POA: Diagnosis not present

## 2014-03-15 DIAGNOSIS — M1710 Unilateral primary osteoarthritis, unspecified knee: Secondary | ICD-10-CM | POA: Diagnosis not present

## 2014-03-21 ENCOUNTER — Encounter (HOSPITAL_COMMUNITY): Payer: Self-pay | Admitting: Emergency Medicine

## 2014-03-21 ENCOUNTER — Emergency Department (HOSPITAL_COMMUNITY)
Admission: EM | Admit: 2014-03-21 | Discharge: 2014-03-22 | Disposition: A | Discharge status: Home / Self Care | Payer: Medicare Other | Attending: Dermatology | Admitting: Dermatology

## 2014-03-21 DIAGNOSIS — M542 Cervicalgia: Secondary | ICD-10-CM | POA: Diagnosis not present

## 2014-03-21 DIAGNOSIS — M546 Pain in thoracic spine: Secondary | ICD-10-CM | POA: Diagnosis not present

## 2014-03-21 DIAGNOSIS — R609 Edema, unspecified: Secondary | ICD-10-CM | POA: Diagnosis not present

## 2014-03-21 DIAGNOSIS — M25519 Pain in unspecified shoulder: Secondary | ICD-10-CM | POA: Diagnosis not present

## 2014-03-21 DIAGNOSIS — R079 Chest pain, unspecified: Secondary | ICD-10-CM | POA: Diagnosis not present

## 2014-03-21 DIAGNOSIS — R0789 Other chest pain: Secondary | ICD-10-CM | POA: Diagnosis not present

## 2014-03-21 DIAGNOSIS — Z7982 Long term (current) use of aspirin: Secondary | ICD-10-CM | POA: Diagnosis not present

## 2014-03-21 DIAGNOSIS — Z79899 Other long term (current) drug therapy: Secondary | ICD-10-CM | POA: Diagnosis not present

## 2014-03-21 DIAGNOSIS — M47814 Spondylosis without myelopathy or radiculopathy, thoracic region: Secondary | ICD-10-CM | POA: Diagnosis not present

## 2014-03-21 DIAGNOSIS — M25511 Pain in right shoulder: Secondary | ICD-10-CM

## 2014-03-21 DIAGNOSIS — M129 Arthropathy, unspecified: Secondary | ICD-10-CM | POA: Insufficient documentation

## 2014-03-21 DIAGNOSIS — M549 Dorsalgia, unspecified: Secondary | ICD-10-CM | POA: Insufficient documentation

## 2014-03-21 DIAGNOSIS — M25512 Pain in left shoulder: Secondary | ICD-10-CM

## 2014-03-21 HISTORY — DX: Unspecified osteoarthritis, unspecified site: M19.90

## 2014-03-21 NOTE — ED Notes (Signed)
Pt reports bila shoulder pain  And back pain and chest pain when moving her arms.  Pt denies lifting heavy objects recently.  Pt reports changing the sheets on her bed.

## 2014-03-22 ENCOUNTER — Emergency Department (HOSPITAL_COMMUNITY): Payer: Medicare Other

## 2014-03-22 DIAGNOSIS — R079 Chest pain, unspecified: Secondary | ICD-10-CM | POA: Diagnosis not present

## 2014-03-22 DIAGNOSIS — M47814 Spondylosis without myelopathy or radiculopathy, thoracic region: Secondary | ICD-10-CM | POA: Diagnosis not present

## 2014-03-22 DIAGNOSIS — M546 Pain in thoracic spine: Secondary | ICD-10-CM | POA: Diagnosis not present

## 2014-03-22 LAB — TROPONIN I

## 2014-03-22 MED ORDER — TRAMADOL HCL 50 MG PO TABS: 50.0000 mg | ORAL_TABLET | Freq: Four times a day (QID) | ORAL | Status: DC | PRN

## 2014-03-22 MED ORDER — HYDROMORPHONE HCL 2 MG PO TABS: 1.0000 mg | ORAL_TABLET | Freq: Two times a day (BID) | ORAL | Status: DC | PRN

## 2014-03-22 MED ORDER — TRAMADOL HCL 50 MG PO TABS
50.0000 mg | ORAL_TABLET | Freq: Once | ORAL | Status: AC
  Administered 2014-03-22: 50 mg via ORAL
  Filled 2014-03-22: qty 1

## 2014-03-22 NOTE — Discharge Instructions (Signed)
You may try ultram as need for pain - no driving when taking. You may also over the counter patches for symptomatic relief (such as Thermacare Patches, or IcyHot Patches). Follow up with primary care doctor in the next couple days.  Have them coordinate follow up with orthopedist regarding your shoulder issues.  Also discuss further evaluation and treatment of neck and upper back pain.    Your blood pressure is high tonight (possibly in part due to your pain).  Continue your blood pressure medication and follow up with primary care doctor in coming week.     Return to ER if worse, new symptoms, fevers, arm numbness/weakness, recurrent or persistent chest pain, trouble breathing, other concern.     Shoulder Pain The shoulder is the joint that connects your arms to your body. The bones that form the shoulder joint include the upper arm bone (humerus), the shoulder blade (scapula), and the collarbone (clavicle). The top of the humerus is shaped like a ball and fits into a rather flat socket on the scapula (glenoid cavity). A combination of muscles and strong, fibrous tissues that connect muscles to bones (tendons) support your shoulder joint and hold the ball in the socket. Small, fluid-filled sacs (bursae) are located in different areas of the joint. They act as cushions between the bones and the overlying soft tissues and help reduce friction between the gliding tendons and the bone as you move your arm. Your shoulder joint allows a wide range of motion in your arm. This range of motion allows you to do things like scratch your back or throw a ball. However, this range of motion also makes your shoulder more prone to pain from overuse and injury. Causes of shoulder pain can originate from both injury and overuse and usually can be grouped in the following four categories:  Redness, swelling, and pain (inflammation) of the tendon (tendinitis) or the bursae (bursitis).  Instability, such as a  dislocation of the joint.  Inflammation of the joint (arthritis).  Broken bone (fracture). HOME CARE INSTRUCTIONS   Apply ice to the sore area.  Put ice in a plastic bag.  Place a towel between your skin and the bag.  Leave the ice on for 15-20 minutes, 3-4 times per day for the first 2 days, or as directed by your health care provider.  Stop using cold packs if they do not help with the pain.  If you have a shoulder sling or immobilizer, wear it as long as your caregiver instructs. Only remove it to shower or bathe. Move your arm as little as possible, but keep your hand moving to prevent swelling.  Squeeze a soft ball or foam pad as much as possible to help prevent swelling.  Only take over-the-counter or prescription medicines for pain, discomfort, or fever as directed by your caregiver. SEEK MEDICAL CARE IF:   Your shoulder pain increases, or new pain develops in your arm, hand, or fingers.  Your hand or fingers become cold and numb.  Your pain is not relieved with medicines. SEEK IMMEDIATE MEDICAL CARE IF:   Your arm, hand, or fingers are numb or tingling.  Your arm, hand, or fingers are significantly swollen or turn white or blue. MAKE SURE YOU:   Understand these instructions.  Will watch your condition.  Will get help right away if you are not doing well or get worse. Document Released: 04/16/2005 Document Revised: 11/21/2013 Document Reviewed: 06/21/2011 [facility] Patient Information 2015 [place], [place]. This information is not  intended to replace advice given to you by your health care provider. Make sure you discuss any questions you have with your health care provider.   Back Pain, Adult Low back pain is very common. About 1 in 5 people have back pain.The cause of low back pain is rarely dangerous. The pain often gets better over time.About half of people with a sudden onset of back pain feel better in just 2 weeks. About 8 in 10 people feel better by 6  weeks.  CAUSES Some common causes of back pain include:  Strain of the muscles or ligaments supporting the spine.  Wear and tear (degeneration) of the spinal discs.  Arthritis.  Direct injury to the back. DIAGNOSIS Most of the time, the direct cause of low back pain is not known.However, back pain can be treated effectively even when the exact cause of the pain is unknown.Answering your caregiver's questions about your overall health and symptoms is one of the most accurate ways to make sure the cause of your pain is not dangerous. If your caregiver needs more information, he or she may order lab work or imaging tests (X-rays or MRIs).However, even if imaging tests show changes in your back, this usually does not require surgery. HOME CARE INSTRUCTIONS For many people, back pain returns.Since low back pain is rarely dangerous, it is often a condition that people can learn to [facility] their own.   Remain active. It is stressful on the back to sit or stand in one place. Do not sit, drive, or stand in one place for more than 30 minutes at a time. Take short walks on level surfaces as soon as pain allows.Try to increase the length of time you walk each day.  Do not stay in bed.Resting more than 1 or 2 days can delay your recovery.  Do not avoid exercise or work.Your body is made to move.It is not dangerous to be active, even though your back may hurt.Your back will likely heal faster if you return to being active before your pain is gone.  Pay attention to your body when you bend and lift. Many people have less discomfortwhen lifting if they bend their knees, keep the load close to their bodies,and avoid twisting. Often, the most comfortable positions are those that put less stress on your recovering back.  Find a comfortable position to sleep. Use a firm mattress and lie on your side with your knees slightly bent. If you lie on your back, put a pillow under your knees.  Only take  over-the-counter or prescription medicines as directed by your caregiver. Over-the-counter medicines to reduce pain and inflammation are often the most helpful.Your caregiver may prescribe muscle relaxant drugs.These medicines help dull your pain so you can more quickly return to your normal activities and healthy exercise.  Put ice on the injured area.  Put ice in a plastic bag.  Place a towel between your skin and the bag.  Leave the ice on for 15-20 minutes, 03-04 times a day for the first 2 to 3 days. After that, ice and heat may be alternated to reduce pain and spasms.  Ask your caregiver about trying back exercises and gentle massage. This may be of some benefit.  Avoid feeling anxious or stressed.Stress increases muscle tension and can worsen back pain.It is important to recognize when you are anxious or stressed and learn ways to manage it.Exercise is a great option. SEEK MEDICAL CARE IF:  You have pain that is not relieved  with rest or medicine.  You have pain that does not improve in 1 week.  You have new symptoms.  You are generally not feeling well. SEEK IMMEDIATE MEDICAL CARE IF:   You have pain that radiates from your back into your legs.  You develop new bowel or bladder control problems.  You have unusual weakness or numbness in your arms or legs.  You develop nausea or vomiting.  You develop abdominal pain.  You feel faint. Document Released: 07/07/2005 Document Revised: 01/06/2012 Document Reviewed: 11/08/2013 [facility] Patient Information 2015 [place], [place]. This information is not intended to replace advice given to you by your health care provider. Make sure you discuss any questions you have with your health care provider.     Thoracic Strain You have injured the muscles or tendons that attach to the upper part of your back behind your chest. This injury is called a thoracic strain, thoracic sprain, or mid-back strain.  CAUSES  The cause of  thoracic strain varies. A less severe injury involves pulling a muscle or tendon without tearing it. A more severe injury involves tearing (rupturing) a muscle or tendon. With less severe injuries, there may be little loss of strength. Sometimes, there are breaks (fractures) in the bones to which the muscles are attached. These fractures are rare, unless there was a direct hit (trauma) or you have weak bones due to osteoporosis or age. Longstanding strains may be caused by overuse or improper form during certain movements. Obesity can also increase your risk for back injuries. Sudden strains may occur due to injury or not warming up properly before exercise. Often, there is no obvious cause for a thoracic strain. SYMPTOMS  The main symptom is pain, especially with movement, such as during exercise. DIAGNOSIS  Your caregiver can usually tell what is wrong by taking an X-ray and doing a physical exam. TREATMENT   Physical therapy may be helpful for recovery. Your caregiver can give you exercises to do or refer you to a physical therapist after your pain improves.  After your pain improves, strengthening and conditioning programs appropriate for your sport or occupation may be helpful.  Always warm up before physical activities or athletics. Stretching after physical activity may also help.  Certain over-the-counter medicines may also help. Ask your caregiver if there are medicines that would help you. If this is your first thoracic strain injury, proper care and proper healing time before starting activities should prevent long-term problems. Torn ligaments and tendons require as long to heal as broken bones. Average healing times may be only 1 week for a mild strain. For torn muscles and tendons, healing time may be up to 6 weeks to 2 months. HOME CARE INSTRUCTIONS   Apply ice to the injured area. Ice massages may also be used as directed.  Put ice in a plastic bag.  Place a towel between your  skin and the bag.  Leave the ice on for 15-20 minutes, 03-04 times a day, for the first 2 days.  Only take over-the-counter or prescription medicines for pain, discomfort, or fever as directed by your caregiver.  Keep your appointments for physical therapy if this was prescribed.  Use wraps and back braces as instructed. SEEK IMMEDIATE MEDICAL CARE IF:   You have an increase in bruising, swelling, or pain.  Your pain has not improved with medicines.  You develop new shortness of breath, chest pain, or fever.  Problems seem to be getting worse rather than better. MAKE SURE  YOU:   Understand these instructions.  Will watch your condition.  Will get help right away if you are not doing well or get worse. Document Released: 09/27/2003 Document Revised: 09/29/2011 Document Reviewed: 08/23/2010 [facility] Patient Information 2015 [place], [place]. This information is not intended to replace advice given to you by your health care provider. Make sure you discuss any questions you have with your health care provider.    Degenerative Disk Disease Degenerative disk disease is a condition caused by the changes that occur in the cushions of the backbone (spinal disks) as you grow older. Spinal disks are soft and compressible disks located between the bones of the spine (vertebrae). They act like shock absorbers. Degenerative disk disease can affect the whole spine. However, the neck and lower back are most commonly affected. Many changes can occur in the spinal disks with aging, such as:  The spinal disks may dry and shrink.  Small tears may occur in the tough, outer covering of the disk (annulus).  The disk space may become smaller due to loss of water.  Abnormal growths in the bone (spurs) may occur. This can put pressure on the nerve roots exiting the spinal canal, causing pain.  The spinal canal may become narrowed. CAUSES  Degenerative disk disease is a condition caused by the  changes that occur in the spinal disks with aging. The exact cause is not known, but there is a genetic basis for many patients. Degenerative changes can occur due to loss of fluid in the disk. This makes the disk thinner and reduces the space between the backbones. Small cracks can develop in the outer layer of the disk. This can lead to the breakdown of the disk. You are more likely to get degenerative disk disease if you are overweight. Smoking cigarettes and doing heavy work such as weightlifting can also increase your risk of this condition. Degenerative changes can start after a sudden injury. Growth of bone spurs can compress the nerve roots and cause pain.  SYMPTOMS  The symptoms vary from person to person. Some people may have no pain, while others have severe pain. The pain may be so severe that it can limit your activities. The location of the pain depends on the part of your backbone that is affected. You will have neck or arm pain if a disk in the neck area is affected. You will have pain in your back, buttocks, or legs if a disk in the lower back is affected. The pain becomes worse while bending, reaching up, or with twisting movements. The pain may start gradually and then get worse as time passes. It may also start after a major or minor injury. You may feel numbness or tingling in the arms or legs.  DIAGNOSIS  Your caregiver will ask you about your symptoms and about activities or habits that may cause the pain. He or she may also ask about any injuries, diseases, or treatments you have had earlier. Your caregiver will examine you to check for the range of movement that is possible in the affected area, to check for strength in your extremities, and to check for sensation in the areas of the arms and legs supplied by different nerve roots. An X-ray of the spine may be taken. Your caregiver may suggest other imaging tests, such as magnetic resonance imaging (MRI), if needed.  TREATMENT    Treatment includes rest, modifying your activities, and applying ice and heat. Your caregiver may prescribe medicines to reduce your  pain and may ask you to do some exercises to strengthen your back. In some cases, you may need surgery. You and your caregiver will decide on the treatment that is best for you. HOME CARE INSTRUCTIONS   Follow proper lifting and walking techniques as advised by your caregiver.  Maintain good posture.  Exercise regularly as advised.  Perform relaxation exercises.  Change your sitting, standing, and sleeping habits as advised. Change positions frequently.  Lose weight as advised.  Stop smoking if you smoke.  Wear supportive footwear. SEEK MEDICAL CARE IF:  Your pain does not go away within 1 to 4 weeks. SEEK IMMEDIATE MEDICAL CARE IF:   Your pain is severe.  You notice weakness in your arms, hands, or legs.  You begin to lose control of your bladder or bowel movements. MAKE SURE YOU:   Understand these instructions.  Will watch your condition.  Will get help right away if you are not doing well or get worse. Document Released: 05/04/2007 Document Revised: 09/29/2011 Document Reviewed: 11/08/2013 [facility] Patient Information 2015 [place], [place]. This information is not intended to replace advice given to you by your health care provider. Make sure you discuss any questions you have with your health care provider.   Osteoarthritis Osteoarthritis is a disease that causes soreness and inflammation of a joint. It occurs when the cartilage at the affected joint wears down. Cartilage acts as a cushion, covering the ends of bones where they meet to form a joint. Osteoarthritis is the most common form of arthritis. It often occurs in older people. The joints affected most often by this condition include those in the:  Ends of the fingers.  Thumbs.  Neck.  Lower back.  Knees.  Hips. CAUSES  Over time, the cartilage that covers the ends of  bones begins to wear away. This causes bone to rub on bone, producing pain and stiffness in the affected joints.  RISK FACTORS Certain factors can increase your chances of having osteoarthritis, including:  Older age.  Excessive body weight.  Overuse of joints.  Previous joint injury. SIGNS AND SYMPTOMS   Pain, swelling, and stiffness in the joint.  Over time, the joint may lose its normal shape.  Small deposits of bone (osteophytes) may grow on the edges of the joint.  Bits of bone or cartilage can break off and float inside the joint space. This may cause more pain and damage. DIAGNOSIS  Your health care provider will do a physical exam and ask about your symptoms. Various tests may be ordered, such as:  X-rays of the affected joint.  An MRI scan.  Blood tests to rule out other types of arthritis.  Joint fluid tests. This involves using a needle to draw fluid from the joint and examining the fluid under a microscope. TREATMENT  Goals of treatment are to control pain and improve joint function. Treatment plans may include:  A prescribed exercise program that allows for rest and joint relief.  A weight control plan.  Pain relief techniques, such as:  Properly applied heat and cold.  Electric pulses delivered to nerve endings under the skin (transcutaneous electrical nerve stimulation [TENS]).  Massage.  Certain nutritional supplements.  Medicines to control pain, such as:  Acetaminophen.  Nonsteroidal anti-inflammatory drugs (NSAIDs), such as naproxen.  Narcotic or central-acting agents, such as tramadol.  Corticosteroids. These can be given orally or as an injection.  Surgery to reposition the bones and relieve pain (osteotomy) or to remove loose pieces of bone  and cartilage. Joint replacement may be needed in advanced states of osteoarthritis. HOME CARE INSTRUCTIONS   Take medicines only as directed by your health care provider.  Maintain a healthy  weight. Follow your health care provider's instructions for weight control. This may include dietary instructions.  Exercise as directed. Your health care provider can recommend specific types of exercise. These may include:  Strengthening exercises. These are done to strengthen the muscles that support joints affected by arthritis. They can be performed with weights or with exercise bands to add resistance.  Aerobic activities. These are exercises, such as brisk walking or low-impact aerobics, that get your heart pumping.  Range-of-motion activities. These keep your joints limber.  Balance and agility exercises. These help you maintain daily living skills.  Rest your affected joints as directed by your health care provider.  Keep all follow-up visits as directed by your health care provider. SEEK MEDICAL CARE IF:   Your skin turns red.  You develop a rash in addition to your joint pain.  You have worsening joint pain.  You have a fever along with joint or muscle aches. SEEK IMMEDIATE MEDICAL CARE IF:  You have a significant loss of weight or appetite.  You have night sweats. [place] of Arthritis and Musculoskeletal and Skin Diseases: www.niams.SouthExposed.es  [employer] on Aging: http://kim-miller.com/  American College of Rheumatology: www.rheumatology.org Document Released: 07/07/2005 Document Revised: 11/21/2013 Document Reviewed: 03/14/2013 [facility] Patient Information 2015 [place], [place]. This information is not intended to replace advice given to you by your health care provider. Make sure you discuss any questions you have with your health care provider. Chronic Pain Chronic pain can be defined as pain that is off and on and lasts for 3-6 months or longer. Many things cause chronic pain, which can make it difficult to make a diagnosis. There are many treatment options available for chronic pain. However, finding a treatment that works well  for you may require trying various approaches until the right one is found. Many people benefit from a combination of two or more types of treatment to control their pain. SYMPTOMS  Chronic pain can occur anywhere in the body and can range from mild to very severe. Some types of chronic pain include:  Headache.  Low back pain.  Cancer pain.  Arthritis pain.  Neurogenic pain. This is pain resulting from damage to nerves. People with chronic pain may also have other symptoms such as:  Depression.  Anger.  Insomnia.  Anxiety. DIAGNOSIS  Your health care provider will help diagnose your condition over time. In many cases, the initial focus will be on excluding possible conditions that could be causing the pain. Depending on your symptoms, your health care provider may order tests to diagnose your condition. Some of these tests may include:   Blood tests.   CT scan.   MRI.   X-rays.   Ultrasounds.   Nerve conduction studies.  You may need to see a specialist.  TREATMENT  Finding treatment that works well may take time. You may be referred to a pain specialist. He or she may prescribe medicine or therapies, such as:   Mindful meditation or yoga.  Shots (injections) of numbing or pain-relieving medicines into the spine or area of pain.  Local electrical stimulation.  Acupuncture.   Massage therapy.   Aroma, color, light, or sound therapy.   Biofeedback.   Working with a physical therapist to keep from getting stiff.   Regular,  gentle exercise.   Cognitive or behavioral therapy.   Group support.  Sometimes, surgery may be recommended.  HOME CARE INSTRUCTIONS   Take all medicines as directed by your health care provider.   Lessen stress in your life by relaxing and doing things such as listening to calming music.   Exercise or be active as directed by your health care provider.   Eat a healthy diet and include things such as vegetables,  fruits, fish, and lean meats in your diet.   Keep all follow-up appointments with your health care provider.   Attend a support group with others suffering from chronic pain. SEEK MEDICAL CARE IF:   Your pain gets worse.   You develop a new pain that was not there before.   You cannot tolerate medicines given to you by your health care provider.   You have new symptoms since your last visit with your health care provider.  SEEK IMMEDIATE MEDICAL CARE IF:   You feel weak.   You have decreased sensation or numbness.   You lose control of bowel or bladder function.   Your pain suddenly gets much worse.   You develop shaking.  You develop chills.  You develop confusion.  You develop chest pain.  You develop shortness of breath.  MAKE SURE YOU:  Understand these instructions.  Will watch your condition.  Will get help right away if you are not doing well or get worse. Document Released: 03/29/2002 Document Revised: 03/09/2013 Document Reviewed: 12/31/2012 [facility] Patient Information 2015 [place], [place]. This information is not intended to replace advice given to you by your health care provider. Make sure you discuss any questions you have with your health care provider.  Ms. Parrales, you were seen today for chronic pain.  Your oxycodone is no longer working.  We are sending you home with a limited supply of dilaudid for treatment.  Please take as directed, this medication is very strong.  Do not drive as it can make you drowsy.  You can also take motrin with your pain medication to help with inflammation.  Follow up with your regular doctor within 3 days for continued care.  If your symptoms worsen, return to the ED for repeat evaluation.  Thank you.

## 2014-03-22 NOTE — ED Provider Notes (Signed)
CSN: [id number]     Arrival date & time 03/21/14  2106 History   First MD Initiated Contact with Patient 03/21/14 2154     Chief Complaint  Patient presents with  . Back Pain  . Shoulder Pain     (Consider location/radiation/quality/duration/timing/severity/associated sxs/prior Treatment) Patient is a 80 y.o. female presenting with back pain and shoulder pain. The history is provided by the patient and a relative.  Back Pain Associated symptoms: chest pain   Associated symptoms: no abdominal pain, no fever and no headaches   Shoulder Pain Associated symptoms include chest pain. Pertinent negatives include no abdominal pain, no headaches and no shortness of breath.  pt c/o problems w chronic pain right shoulder x many months due to djd and rotator cuff injury, such that she routinely sleeps in recliner.  States the pain continually interferes w sleep. In past couple days, also pain across bilateral shoulders and upper back. Radicular pain. No numbness/weakness. Denies hx ddd. Pt states also then felt pain across bilateral upper chest. No sob. No nv or diaphoresis. Pain constant. No episodic or exertional cp. No pleuritic pain. No cough or uri c/o. No leg pain. Chronic bil lower leg and ankle edema, no change. No orthopnea/pnd.  States has tried variety of injections for pain and initially was on tylenol/motrin. More recently her doctor prescribed oxycodone.     Past Medical History  Diagnosis Date  . Arthritis    History reviewed. No pertinent past surgical history. No family history on file. History  Substance Use Topics  . Smoking status: Never Smoker   . Smokeless tobacco: Not on file  . Alcohol Use: No   OB History   Grav Para Term Preterm Abortions TAB SAB Ect Mult Living                 Review of Systems  Constitutional: Negative for fever and chills.  HENT: Negative for sore throat.   Eyes: Negative for redness.  Respiratory: Negative for cough and shortness of breath.    Cardiovascular: Positive for chest pain.  Gastrointestinal: Negative for nausea, vomiting and abdominal pain.  Genitourinary: Negative for flank pain.  Musculoskeletal: Positive for back pain and neck pain.  Skin: Negative for rash.  Neurological: Negative for headaches.  Hematological: Does not bruise/bleed easily.  Psychiatric/Behavioral: Negative for confusion.      Allergies  Review of patient's allergies indicates no known allergies.  Home Medications   Prior to Admission medications   Medication Sig Start Date End Date Taking? Authorizing Provider  amLODipine (NORVASC) 5 MG tablet Take 5 mg by mouth daily.   Yes Historical Provider, MD  aspirin EC 81 MG tablet Take 81 mg by mouth daily.   Yes Historical Provider, MD  calcium carbonate (OS-CAL - DOSED IN MG OF ELEMENTAL CALCIUM) 1250 MG tablet Take 1 tablet by mouth 2 (two) times daily with a meal.   Yes Historical Provider, MD  doxazosin (CARDURA) 1 MG tablet Take 1 mg by mouth daily.   Yes Historical Provider, MD  hydrochlorothiazide (MICROZIDE) 12.5 MG capsule Take 12.5 mg by mouth daily.   Yes Historical Provider, MD  levothyroxine (SYNTHROID, LEVOTHROID) 75 MCG tablet Take 75 mcg by mouth daily before breakfast.   Yes Historical Provider, MD  Multiple Vitamins-Minerals (ICAPS AREDS FORMULA PO) Take 1 tablet by mouth 2 (two) times daily.   Yes Historical Provider, MD  OxyCODONE HCl, Abuse Deter, 5 MG TABA Take 5 mg by mouth 4 (four) times daily as  needed (pain).   Yes Historical Provider, MD  Polyethyl Glycol-Propyl Glycol (SYSTANE) 0.4-0.3 % SOLN Apply 1 drop to eye daily.   Yes Historical Provider, MD  vitamin B-12 (CYANOCOBALAMIN) 500 MCG tablet Take 500 mcg by mouth daily.   Yes Historical Provider, MD  Vitamin D, Ergocalciferol, (DRISDOL) 50000 UNITS CAPS capsule Take 50,000 Units by mouth every Sunday.   Yes Historical Provider, MD   BP 193/73  Pulse 91  Temp(Src) 98.6 F (37 C) (Oral)  Resp 18  Ht 5\' 2"  (1.575 m)   Wt 185 lb (83.915 kg)  BMI 33.83 kg/m2  SpO2 93% Physical Exam  Nursing note and vitals reviewed. Constitutional: She is oriented to person, place, and time. She appears well-developed and well-nourished. No distress.  HENT:  Mouth/Throat: Oropharynx is clear and moist.  Eyes: Conjunctivae are normal. No scleral icterus.  Neck: Neck supple. No tracheal deviation present.  Cardiovascular: Normal rate, regular rhythm, normal heart sounds and intact distal pulses.   Pulmonary/Chest: Effort normal and breath sounds normal. No respiratory distress. She exhibits tenderness.  Abdominal: Soft. Normal appearance. She exhibits no distension. There is no tenderness.  Musculoskeletal: She exhibits edema. She exhibits no tenderness.  Upper back/thoracic tenderness w palp, including bil trapezius and paraspinal muscles, otherwise, CTLS spine, non tender, aligned, no step off.  No focal cervical midline tenderness. Pain w rom/limited rom right shoulder. Symmetric bil ankle and lower leg edema.   Neurological: She is alert and oriented to person, place, and time.  Motor intact bil. sens intact.   Skin: Skin is warm and dry. No rash noted. She is not diaphoretic.  Psychiatric: She has a normal mood and affect.    ED Course  Procedures (including critical care time) Labs Review     EKG Interpretation   Date/Time:  Tuesday March 21 2014 21:40:07 EDT Ventricular Rate:  91 PR Interval:  155 QRS Duration: 77 QT Interval:  347 QTC Calculation: 427 R Axis:   15 Text Interpretation:  Sinus rhythm No significant change since last  tracing Confirmed by Ashok Cordia  MD, Lennette Bihari ([id number]) on 03/22/2014 12:06:00 AM      MDM   Pt has family/ride home.  Ultram po.  Reviewed nursing notes and prior charts for additional history.   Movement, and palp across shoulders/upper back reproduces pts symptoms, which appear very musculoskeletal in etiology.  0103, troponin and xr results pending.  Signed out to  Dr Claudine Mouton - if normal/neg acute, may d/c home w rx to follow up pcp/ortho as outpt.    Mirna Mires, MD 03/22/14 [phone]

## 2014-03-28 DIAGNOSIS — E559 Vitamin D deficiency, unspecified: Secondary | ICD-10-CM | POA: Diagnosis not present

## 2014-03-28 DIAGNOSIS — Z23 Encounter for immunization: Secondary | ICD-10-CM | POA: Diagnosis not present

## 2014-03-28 DIAGNOSIS — M25519 Pain in unspecified shoulder: Secondary | ICD-10-CM | POA: Diagnosis not present

## 2014-03-28 DIAGNOSIS — E039 Hypothyroidism, unspecified: Secondary | ICD-10-CM | POA: Diagnosis not present

## 2014-03-28 DIAGNOSIS — I10 Essential (primary) hypertension: Secondary | ICD-10-CM | POA: Diagnosis not present

## 2014-04-28 ENCOUNTER — Emergency Department (HOSPITAL_COMMUNITY)
Admission: EM | Admit: 2014-04-28 | Discharge: 2014-04-28 | Disposition: A | Discharge status: Home / Self Care | Payer: Medicare Other | Attending: Emergency Medicine | Admitting: Emergency Medicine

## 2014-04-28 ENCOUNTER — Encounter (HOSPITAL_COMMUNITY): Payer: Self-pay | Admitting: Emergency Medicine

## 2014-04-28 ENCOUNTER — Emergency Department (HOSPITAL_COMMUNITY): Payer: Medicare Other

## 2014-04-28 DIAGNOSIS — Z7982 Long term (current) use of aspirin: Secondary | ICD-10-CM | POA: Insufficient documentation

## 2014-04-28 DIAGNOSIS — M1990 Unspecified osteoarthritis, unspecified site: Secondary | ICD-10-CM | POA: Insufficient documentation

## 2014-04-28 DIAGNOSIS — I10 Essential (primary) hypertension: Secondary | ICD-10-CM | POA: Diagnosis not present

## 2014-04-28 DIAGNOSIS — M25511 Pain in right shoulder: Secondary | ICD-10-CM | POA: Diagnosis not present

## 2014-04-28 DIAGNOSIS — G8929 Other chronic pain: Secondary | ICD-10-CM | POA: Insufficient documentation

## 2014-04-28 DIAGNOSIS — R600 Localized edema: Secondary | ICD-10-CM | POA: Insufficient documentation

## 2014-04-28 DIAGNOSIS — N390 Urinary tract infection, site not specified: Secondary | ICD-10-CM | POA: Diagnosis not present

## 2014-04-28 DIAGNOSIS — M7989 Other specified soft tissue disorders: Secondary | ICD-10-CM | POA: Diagnosis not present

## 2014-04-28 DIAGNOSIS — B9689 Other specified bacterial agents as the cause of diseases classified elsewhere: Secondary | ICD-10-CM | POA: Diagnosis not present

## 2014-04-28 DIAGNOSIS — R609 Edema, unspecified: Secondary | ICD-10-CM | POA: Diagnosis not present

## 2014-04-28 DIAGNOSIS — Z79899 Other long term (current) drug therapy: Secondary | ICD-10-CM | POA: Diagnosis not present

## 2014-04-28 HISTORY — DX: Other chronic pain: G89.29

## 2014-04-28 HISTORY — DX: Pain in unspecified shoulder: M25.519

## 2014-04-28 HISTORY — DX: Essential (primary) hypertension: I10

## 2014-04-28 LAB — URINALYSIS, ROUTINE W REFLEX MICROSCOPIC
BILIRUBIN URINE: NEGATIVE
GLUCOSE, UA: NEGATIVE mg/dL
Hgb urine dipstick: NEGATIVE
KETONES UR: NEGATIVE mg/dL
Nitrite: NEGATIVE
PH: 8 (ref 5.0–8.0)
Protein, ur: NEGATIVE mg/dL
Specific Gravity, Urine: 1.007 (ref 1.005–1.030)
Urobilinogen, UA: 0.2 mg/dL (ref 0.0–1.0)

## 2014-04-28 LAB — CBC WITH DIFFERENTIAL/PLATELET
Basophils Absolute: 0 10*3/uL (ref 0.0–0.1)
Basophils Relative: 1 % (ref 0–1)
Eosinophils Absolute: 0.1 10*3/uL (ref 0.0–0.7)
Eosinophils Relative: 1 % (ref 0–5)
HCT: 40.1 % (ref 36.0–46.0)
HEMOGLOBIN: 13.1 g/dL (ref 12.0–15.0)
LYMPHS ABS: 2 10*3/uL (ref 0.7–4.0)
LYMPHS PCT: 27 % (ref 12–46)
MCH: 30.1 pg (ref 26.0–34.0)
MCHC: 32.7 g/dL (ref 30.0–36.0)
MCV: 92.2 fL (ref 78.0–100.0)
Monocytes Absolute: 0.4 10*3/uL (ref 0.1–1.0)
Monocytes Relative: 6 % (ref 3–12)
NEUTROS PCT: 65 % (ref 43–77)
Neutro Abs: 4.9 10*3/uL (ref 1.7–7.7)
PLATELETS: 197 10*3/uL (ref 150–400)
RBC: 4.35 MIL/uL (ref 3.87–5.11)
RDW: 12.4 % (ref 11.5–15.5)
WBC: 7.4 10*3/uL (ref 4.0–10.5)

## 2014-04-28 LAB — BASIC METABOLIC PANEL
Anion gap: 15 (ref 5–15)
BUN: 19 mg/dL (ref 6–23)
CALCIUM: 10.6 mg/dL — AB (ref 8.4–10.5)
CHLORIDE: 99 meq/L (ref 96–112)
CO2: 27 meq/L (ref 19–32)
CREATININE: 0.8 mg/dL (ref 0.50–1.10)
GFR calc Af Amer: 75 mL/min — ABNORMAL LOW (ref >90)
GFR calc non Af Amer: 64 mL/min — ABNORMAL LOW (ref >90)
GLUCOSE: 101 mg/dL — AB (ref 70–99)
Potassium: 3.7 mEq/L (ref 3.7–5.3)
Sodium: 141 mEq/L (ref 137–147)

## 2014-04-28 LAB — PRO B NATRIURETIC PEPTIDE: Pro B Natriuretic peptide (BNP): 200.7 pg/mL (ref 0–450)

## 2014-04-28 LAB — URINE MICROSCOPIC-ADD ON

## 2014-04-28 MED ORDER — HYDROCHLOROTHIAZIDE 12.5 MG PO CAPS
12.5000 mg | ORAL_CAPSULE | Freq: Once | ORAL | Status: AC
  Administered 2014-04-28: 12.5 mg via ORAL
  Filled 2014-04-28: qty 1

## 2014-04-28 MED ORDER — HYDROCHLOROTHIAZIDE 25 MG PO TABS: 25.0000 mg | ORAL_TABLET | Freq: Every day | ORAL | Status: DC

## 2014-04-28 MED ORDER — HYDROCHLOROTHIAZIDE 12.5 MG PO CAPS
25.0000 mg | ORAL_CAPSULE | Freq: Once | ORAL | Status: DC
Start: 2014-04-28 — End: 2014-04-28

## 2014-04-28 MED ORDER — CEPHALEXIN 500 MG PO CAPS: 500.0000 mg | ORAL_CAPSULE | Freq: Two times a day (BID) | ORAL | Status: DC

## 2014-04-28 MED ORDER — MORPHINE SULFATE 4 MG/ML IJ SOLN
4.0000 mg | Freq: Once | INTRAMUSCULAR | Status: AC
  Administered 2014-04-28: 4 mg via INTRAVENOUS
  Filled 2014-04-28: qty 1

## 2014-04-28 NOTE — ED Provider Notes (Signed)
Medical screening examination/treatment/procedure(s) were conducted as a shared visit with non-physician practitioner(s) and myself.  I personally evaluated the patient during the encounter.   EKG Interpretation   Date/Time:  Friday April 28 2014 16:22:24 EDT Ventricular Rate:  69 PR Interval:  166 QRS Duration: 83 QT Interval:  412 QTC Calculation: 441 R Axis:   24 Text Interpretation:  Sinus rhythm Low voltage, precordial leads  Nonspecific T abnormalities, lateral leads Baseline wander in lead(s) II  III aVF No significant change since last tracing Confirmed by [name]  MD,  Tory Mckissack ([id number]) on 04/28/2014 4:29:42 PM      Carla Taylor is a 80 y.o. female hx of HTN, R shoulder arthritis here with hypertension, leg swelling. Chronic leg swelling. She went to get preop clearance and was noted to be hypertensive 200/100. Denies chest pain, shortness of breath, ab pain. Has decreased urine output. Exam showed patient younger than stated age, well appearing. Heart, lung, abdomen, and neuro exam unremarkable. 1+ edema bilateral legs. After pain meds, hctz, BP dec to 170 from 200. Labs at baseline. UA ? UTI, will give keflex. Will increase HCTZ to 25 mg from 12.5 mg, which will help with BP and leg swelling. Will have her f/u with PMD.    Wandra Arthurs, MD 04/28/14 [phone]

## 2014-04-28 NOTE — ED Notes (Signed)
Pt at xray

## 2014-04-28 NOTE — ED Provider Notes (Signed)
CSN: [id number]     Arrival date & time 04/28/14  1447 History   First MD Initiated Contact with Patient 04/28/14 1539     Chief Complaint  Patient presents with  . Hypertension  . Leg Swelling     (Consider location/radiation/quality/duration/timing/severity/associated sxs/prior Treatment) HPI  Patient sent in from PCP for elevated blood pressure.  Pt was being seen in office today for clearance for surgery for right shoulder replacement.  Noted to have elevated BP in office, systolic > 025.  Pt has noted bilateral lower extremity edema x 3 days - noted to be uncomfortable but not painful.  Also has had decreased urination over the same amount of time.  Chronic right shoulder pain is unchanged.  Denies fevers, recent illness, chest pain, SOB, orthopnea, DOE.  No recent immobilization, no problems with gait.  Denies syncope.  No recent medication changes.    Past Medical History  Diagnosis Date  . Arthritis   . Hypertension   . Chronic shoulder pain    No past surgical history on file. No family history on file. History  Substance Use Topics  . Smoking status: Never Smoker   . Smokeless tobacco: Not on file  . Alcohol Use: No   OB History   Grav Para Term Preterm Abortions TAB SAB Ect Mult Living                 Review of Systems  All other systems reviewed and are negative.     Allergies  Review of patient's allergies indicates no known allergies.  Home Medications   Prior to Admission medications   Medication Sig Start Date End Date Taking? Authorizing Provider  acetaminophen (TYLENOL) 325 MG tablet Take 650 mg by mouth every 6 (six) hours as needed for moderate pain (shoulder pain).   Yes Historical Provider, MD  amLODipine (NORVASC) 5 MG tablet Take 5 mg by mouth daily.   Yes Historical Provider, MD  aspirin EC 81 MG tablet Take 81 mg by mouth daily.   Yes Historical Provider, MD  calcium carbonate (OS-CAL - DOSED IN MG OF ELEMENTAL CALCIUM) 1250 MG tablet Take 1  tablet by mouth 2 (two) times daily with a meal.   Yes Historical Provider, MD  doxazosin (CARDURA) 1 MG tablet Take 1 mg by mouth daily.   Yes Historical Provider, MD  hydrochlorothiazide (MICROZIDE) 12.5 MG capsule Take 12.5 mg by mouth daily.   Yes Historical Provider, MD  ibuprofen (ADVIL,MOTRIN) 100 MG tablet Take 200 mg by mouth every 6 (six) hours as needed for pain or fever (shoulder pain).   Yes Historical Provider, MD  levothyroxine (SYNTHROID, LEVOTHROID) 75 MCG tablet Take 75 mcg by mouth daily before breakfast.   Yes Historical Provider, MD  Multiple Vitamins-Minerals (ICAPS AREDS FORMULA PO) Take 1 tablet by mouth 2 (two) times daily.   Yes Historical Provider, MD  OxyCODONE HCl, Abuse Deter, 5 MG TABA Take 5 mg by mouth 4 (four) times daily as needed (pain).   Yes Historical Provider, MD  Polyethyl Glycol-Propyl Glycol (SYSTANE) 0.4-0.3 % SOLN Place 1 drop into both eyes daily.    Yes Historical Provider, MD  vitamin B-12 (CYANOCOBALAMIN) 500 MCG tablet Take 500 mcg by mouth daily.   Yes Historical Provider, MD  Vitamin D, Ergocalciferol, (DRISDOL) 50000 UNITS CAPS capsule Take 50,000 Units by mouth every Sunday.   Yes Historical Provider, MD   BP 208/73  Pulse 79  Temp(Src) 97.8 F (36.6 C) (Oral)  Resp 18  SpO2 95% Physical Exam  Nursing note and vitals reviewed. Constitutional: She appears well-developed and well-nourished. No distress.  HENT:  Head: Normocephalic and atraumatic.  Eyes: Conjunctivae are normal.  Neck: Neck supple.  Cardiovascular: Normal rate, regular rhythm and intact distal pulses.   Pulmonary/Chest: Effort normal and breath sounds normal. No respiratory distress. She has no wheezes. She has no rales.  Abdominal: Soft. She exhibits no distension. There is no tenderness. There is no rebound and no guarding.  Musculoskeletal: She exhibits edema (bilateral lower extremity edema to shins without pitting.  No erythema, warmth, tenderness.).  Neurological:  She is alert.  Skin: She is not diaphoretic.  Psychiatric: She has a normal mood and affect. Her behavior is normal. Thought content normal.    ED Course  Procedures (including critical care time) Labs Review Labs Reviewed  BASIC METABOLIC PANEL - Abnormal; Notable for the following:    Glucose, Bld 101 (*)    Calcium 10.6 (*)    GFR calc non Af Amer 64 (*)    GFR calc Af Amer 75 (*)    All other components within normal limits  URINALYSIS, ROUTINE W REFLEX MICROSCOPIC - Abnormal; Notable for the following:    APPearance TURBID (*)    Leukocytes, UA MODERATE (*)    All other components within normal limits  URINE MICROSCOPIC-ADD ON - Abnormal; Notable for the following:    Bacteria, UA FEW (*)    All other components within normal limits  PRO B NATRIURETIC PEPTIDE  CBC WITH DIFFERENTIAL    Imaging Review Dg Chest 2 View  04/28/2014   CLINICAL DATA:  Hypertension.  Bilateral leg swelling.  EXAM: CHEST  2 VIEW  COMPARISON:  March 22, 2014  FINDINGS: The heart size and mediastinal contours are stable. The aorta is tortuous. The heart size is upper limits of normal. There is no focal infiltrate, pulmonary edema, or pleural effusion. The visualized skeletal structures are stable.  IMPRESSION: No active cardiopulmonary disease.   Electronically Signed   By: Abelardo Diesel M.D.   On: 04/28/2014 16:06     EKG Interpretation   Date/Time:  Friday April 28 2014 16:22:24 EDT Ventricular Rate:  69 PR Interval:  166 QRS Duration: 83 QT Interval:  412 QTC Calculation: 441 R Axis:   24 Text Interpretation:  Sinus rhythm Low voltage, precordial leads  Nonspecific T abnormalities, lateral leads Baseline wander in lead(s) II  III aVF No significant change since last tracing Confirmed by YAO  MD,  DAVID ([id number]) on 04/28/2014 4:29:42 PM      4:54 PM Discussed pt with Dr Darl Householder.   MDM   Final diagnoses:  Essential hypertension  UTI (lower urinary tract infection)  Peripheral edema     Afebrile, nontoxic patient with uncontrolled hypertension, a few days of bilateral lower extremity edema and decreased urination.  No symptoms concerning for hypertensive emergency.   CXR, CBC, BMP, BNP unremarkable.  Discussed with Dr Darl Householder who advises increase in HCTZ from 12.5 to 25mg .  BP on discharge is 170/95.  UA obtained from bedpan unfortunately - discussed with Dr Darl Householder, will treat with Keflex as patient has had symptoms of decreased urinary output.  D/C home with keflex, change in HCTZ dosage.  PCP follow up.  Pt also has cardiology follow up scheduled for clearance for surgery.  Discussed result, findings, treatment, and follow up  with patient.  Pt given return precautions.  Pt verbalizes understanding and agrees with plan.  Clayton Bibles, PA-C 04/28/14 [id number]

## 2014-04-28 NOTE — Discharge Instructions (Signed)
Read the information below.  Use the prescribed medication as directed.  Please discuss all new medications with your pharmacist.  You may return to the Emergency Department at any time for worsening condition or any new symptoms that concern you.  If you develop high fevers, abdominal pain, uncontrolled vomiting, or are unable to tolerate fluids by mouth, return to the ER for a recheck.     Hypertension Hypertension, commonly called high blood pressure, is when the force of blood pumping through your arteries is too strong. Your arteries are the blood vessels that carry blood from your heart throughout your body. A blood pressure reading consists of a higher number over a lower number, such as 110/72. The higher number (systolic) is the pressure inside your arteries when your heart pumps. The lower number (diastolic) is the pressure inside your arteries when your heart relaxes. Ideally you want your blood pressure below 120/80. Hypertension forces your heart to work harder to pump blood. Your arteries may become narrow or stiff. Having hypertension puts you at risk for heart disease, stroke, and other problems.  RISK FACTORS Some risk factors for high blood pressure are controllable. Others are not.  Risk factors you cannot control include:   Race. You may be at higher risk if you are African American.  Age. Risk increases with age.  Gender. Men are at higher risk than women before age 75 years. After age 72, women are at higher risk than men. Risk factors you can control include:  Not getting enough exercise or physical activity.  Being overweight.  Getting too much fat, sugar, calories, or salt in your diet.  Drinking too much alcohol. SIGNS AND SYMPTOMS Hypertension does not usually cause signs or symptoms. Extremely high blood pressure (hypertensive crisis) may cause headache, anxiety, shortness of breath, and nosebleed. DIAGNOSIS  To check if you have hypertension, your health care  provider will measure your blood pressure while you are seated, with your arm held at the level of your heart. It should be measured at least twice using the same arm. Certain conditions can cause a difference in blood pressure between your right and left arms. A blood pressure reading that is higher than normal on one occasion does not mean that you need treatment. If one blood pressure reading is high, ask your health care provider about having it checked again. TREATMENT  Treating high blood pressure includes making lifestyle changes and possibly taking medicine. Living a healthy lifestyle can help lower high blood pressure. You may need to change some of your habits. Lifestyle changes may include:  Following the DASH diet. This diet is high in fruits, vegetables, and whole grains. It is low in salt, red meat, and added sugars.  Getting at least 2 hours of brisk physical activity every week.  Losing weight if necessary.  Not smoking.  Limiting alcoholic beverages.  Learning ways to reduce stress. If lifestyle changes are not enough to get your blood pressure under control, your health care provider may prescribe medicine. You may need to take more than one. Work closely with your health care provider to understand the risks and benefits. HOME CARE INSTRUCTIONS  Have your blood pressure rechecked as directed by your health care provider.   Take medicines only as directed by your health care provider. Follow the directions carefully. Blood pressure medicines must be taken as prescribed. The medicine does not work as well when you skip doses. Skipping doses also puts you at risk for problems.  Do not smoke.   Monitor your blood pressure at home as directed by your health care provider. SEEK MEDICAL CARE IF:   You think you are having a reaction to medicines taken.  You have recurrent headaches or feel dizzy.  You have swelling in your ankles.  You have trouble with your  vision. SEEK IMMEDIATE MEDICAL CARE IF:  You develop a severe headache or confusion.  You have unusual weakness, numbness, or feel faint.  You have severe chest or abdominal pain.  You vomit repeatedly.  You have trouble breathing. MAKE SURE YOU:   Understand these instructions.  Will watch your condition.  Will get help right away if you are not doing well or get worse. Document Released: 07/07/2005 Document Revised: 11/21/2013 Document Reviewed: 04/29/2013 [facility] Patient Information 2015 [place], [place]. This information is not intended to replace advice given to you by your health care provider. Make sure you discuss any questions you have with your health care provider.  Peripheral Edema You have swelling in your legs (peripheral edema). This swelling is due to excess accumulation of salt and water in your body. Edema may be a sign of heart, kidney or liver disease, or a side effect of a medication. It may also be due to problems in the leg veins. Elevating your legs and using special support stockings may be very helpful, if the cause of the swelling is due to poor venous circulation. Avoid long periods of standing, whatever the cause. Treatment of edema depends on identifying the cause. Chips, pretzels, pickles and other salty foods should be avoided. Restricting salt in your diet is almost always needed. Water pills (diuretics) are often used to remove the excess salt and water from your body via urine. These medicines prevent the kidney from reabsorbing sodium. This increases urine flow. Diuretic treatment may also result in lowering of potassium levels in your body. Potassium supplements may be needed if you have to use diuretics daily. Daily weights can help you keep track of your progress in clearing your edema. You should call your caregiver for follow up care as recommended. SEEK IMMEDIATE MEDICAL CARE IF:   You have increased swelling, pain, redness, or heat in your  legs.  You develop shortness of breath, especially when lying down.  You develop chest or abdominal pain, weakness, or fainting.  You have a fever. Document Released: 08/14/2004 Document Revised: 09/29/2011 Document Reviewed: 07/25/2009 [facility] Patient Information 2015 [place], [place]. This information is not intended to replace advice given to you by your health care provider. Make sure you discuss any questions you have with your health care provider.  Urinary Tract Infection Urinary tract infections (UTIs) can develop anywhere along your urinary tract. Your urinary tract is your body's drainage system for removing wastes and extra water. Your urinary tract includes two kidneys, two ureters, a bladder, and a urethra. Your kidneys are a pair of bean-shaped organs. Each kidney is about the size of your fist. They are located below your ribs, one on each side of your spine. CAUSES Infections are caused by microbes, which are microscopic organisms, including fungi, viruses, and bacteria. These organisms are so small that they can only be seen through a microscope. Bacteria are the microbes that most commonly cause UTIs. SYMPTOMS  Symptoms of UTIs may vary by age and gender of the patient and by the location of the infection. Symptoms in young women typically include a frequent and intense urge to urinate and a painful, burning feeling in the  bladder or urethra during urination. Older women and men are more likely to be tired, shaky, and weak and have muscle aches and abdominal pain. A fever may mean the infection is in your kidneys. Other symptoms of a kidney infection include pain in your back or sides below the ribs, nausea, and vomiting. DIAGNOSIS To diagnose a UTI, your caregiver will ask you about your symptoms. Your caregiver also will ask to provide a urine sample. The urine sample will be tested for bacteria and white blood cells. White blood cells are made by your body to help fight  infection. TREATMENT  Typically, UTIs can be treated with medication. Because most UTIs are caused by a bacterial infection, they usually can be treated with the use of antibiotics. The choice of antibiotic and length of treatment depend on your symptoms and the type of bacteria causing your infection. HOME CARE INSTRUCTIONS  If you were prescribed antibiotics, take them exactly as your caregiver instructs you. Finish the medication even if you feel better after you have only taken some of the medication.  Drink enough water and fluids to keep your urine clear or pale yellow.  Avoid caffeine, tea, and carbonated beverages. They tend to irritate your bladder.  Empty your bladder often. Avoid holding urine for long periods of time.  Empty your bladder before and after sexual intercourse.  After a bowel movement, women should cleanse from front to back. Use each tissue only once. SEEK MEDICAL CARE IF:   You have back pain.  You develop a fever.  Your symptoms do not begin to resolve within 3 days. SEEK IMMEDIATE MEDICAL CARE IF:   You have severe back pain or lower abdominal pain.  You develop chills.  You have nausea or vomiting.  You have continued burning or discomfort with urination. MAKE SURE YOU:   Understand these instructions.  Will watch your condition.  Will get help right away if you are not doing well or get worse. Document Released: 04/16/2005 Document Revised: 01/06/2012 Document Reviewed: 08/15/2011 [facility] Patient Information 2015 [place], [place]. This information is not intended to replace advice given to you by your health care provider. Make sure you discuss any questions you have with your health care provider.

## 2014-04-28 NOTE — ED Notes (Signed)
Pt sent here by Dr. Moreen Fowler for htn and leg swelling.  C/o swelling x 2 days.  Best BP at Dr. Laqueta Linden office was over 281 systolic.

## 2014-04-28 NOTE — ED Notes (Signed)
Pt denies being able to void at this time

## 2014-04-28 NOTE — ED Provider Notes (Addendum)
MSE was initiated and I personally evaluated the patient and placed orders (if any) at  3:43 PM on April 28, 2014.  The patient was seen by her doctor today for evaluation of persistent right shoulder pain. She is taking hydrocodone, with partial relief. The pain in her shoulder, prevents her from lying flat in it. She sleeps in a recliner, and has been doing that for 6 months. She denies chest pain, shortness of breath, weakness, or dizziness. She has noticed bilateral lower extremity swelling, for 3 days. She denies fever, chills, chest pain or change in urinary habits. Her PCP was concerned about hypertension, so he sent her here for evaluation. The patient has been chronic, stable dose of blood pressure medicine for many years.  Heart regular rate and rhythm. No murmur. Lungs clear to auscultation. Legs with 3+ pitting edema, bilaterally. Neurologic alert and oriented x3. Behavior is appropriate. Normal mentation. Normal strength and sensation arms, and legs, bilaterally   The patient appears stable so that the remainder of the MSE may be completed by another provider.    Richarda Blade, MD 04/29/14 [id number]

## 2014-05-05 ENCOUNTER — Emergency Department (HOSPITAL_COMMUNITY): Payer: Medicare Other

## 2014-05-05 ENCOUNTER — Encounter (HOSPITAL_COMMUNITY): Payer: Self-pay | Admitting: Emergency Medicine

## 2014-05-05 ENCOUNTER — Inpatient Hospital Stay (HOSPITAL_COMMUNITY)
Admission: EM | Admit: 2014-05-05 | Discharge: 2014-05-10 | DRG: 440 | Disposition: A | Discharge status: Home Health | Payer: Medicare Other | Attending: Internal Medicine | Admitting: Internal Medicine

## 2014-05-05 DIAGNOSIS — R1013 Epigastric pain: Secondary | ICD-10-CM | POA: Diagnosis not present

## 2014-05-05 DIAGNOSIS — E039 Hypothyroidism, unspecified: Secondary | ICD-10-CM | POA: Diagnosis present

## 2014-05-05 DIAGNOSIS — M7989 Other specified soft tissue disorders: Secondary | ICD-10-CM | POA: Diagnosis not present

## 2014-05-05 DIAGNOSIS — I517 Cardiomegaly: Secondary | ICD-10-CM | POA: Diagnosis not present

## 2014-05-05 DIAGNOSIS — I10 Essential (primary) hypertension: Secondary | ICD-10-CM | POA: Diagnosis present

## 2014-05-05 DIAGNOSIS — R109 Unspecified abdominal pain: Secondary | ICD-10-CM | POA: Diagnosis not present

## 2014-05-05 DIAGNOSIS — E876 Hypokalemia: Secondary | ICD-10-CM | POA: Diagnosis present

## 2014-05-05 DIAGNOSIS — M19019 Primary osteoarthritis, unspecified shoulder: Secondary | ICD-10-CM | POA: Diagnosis present

## 2014-05-05 DIAGNOSIS — I6789 Other cerebrovascular disease: Secondary | ICD-10-CM | POA: Diagnosis not present

## 2014-05-05 DIAGNOSIS — K5730 Diverticulosis of large intestine without perforation or abscess without bleeding: Secondary | ICD-10-CM | POA: Diagnosis not present

## 2014-05-05 DIAGNOSIS — K859 Acute pancreatitis without necrosis or infection, unspecified: Secondary | ICD-10-CM | POA: Diagnosis present

## 2014-05-05 DIAGNOSIS — K449 Diaphragmatic hernia without obstruction or gangrene: Secondary | ICD-10-CM | POA: Diagnosis not present

## 2014-05-05 DIAGNOSIS — M19011 Primary osteoarthritis, right shoulder: Secondary | ICD-10-CM

## 2014-05-05 DIAGNOSIS — R933 Abnormal findings on diagnostic imaging of other parts of digestive tract: Secondary | ICD-10-CM | POA: Diagnosis not present

## 2014-05-05 DIAGNOSIS — Z7982 Long term (current) use of aspirin: Secondary | ICD-10-CM | POA: Diagnosis not present

## 2014-05-05 DIAGNOSIS — Z79899 Other long term (current) drug therapy: Secondary | ICD-10-CM

## 2014-05-05 DIAGNOSIS — Z6837 Body mass index (BMI) 37.0-37.9, adult: Secondary | ICD-10-CM | POA: Diagnosis not present

## 2014-05-05 DIAGNOSIS — K7031 Alcoholic cirrhosis of liver with ascites: Secondary | ICD-10-CM | POA: Diagnosis not present

## 2014-05-05 DIAGNOSIS — R0602 Shortness of breath: Secondary | ICD-10-CM | POA: Diagnosis not present

## 2014-05-05 DIAGNOSIS — R079 Chest pain, unspecified: Secondary | ICD-10-CM | POA: Diagnosis not present

## 2014-05-05 DIAGNOSIS — G459 Transient cerebral ischemic attack, unspecified: Secondary | ICD-10-CM | POA: Diagnosis not present

## 2014-05-05 DIAGNOSIS — I700 Atherosclerosis of aorta: Secondary | ICD-10-CM | POA: Diagnosis not present

## 2014-05-05 LAB — COMPREHENSIVE METABOLIC PANEL
ALT: 84 U/L — ABNORMAL HIGH (ref 0–35)
AST: 104 U/L — AB (ref 0–37)
Albumin: 3.7 g/dL (ref 3.5–5.2)
Alkaline Phosphatase: 106 U/L (ref 39–117)
Anion gap: 14 (ref 5–15)
BILIRUBIN TOTAL: 0.8 mg/dL (ref 0.3–1.2)
BUN: 20 mg/dL (ref 6–23)
CHLORIDE: 98 meq/L (ref 96–112)
CO2: 28 mEq/L (ref 19–32)
Calcium: 10.3 mg/dL (ref 8.4–10.5)
Creatinine, Ser: 0.79 mg/dL (ref 0.50–1.10)
GFR calc Af Amer: 84 mL/min — ABNORMAL LOW (ref >90)
GFR calc non Af Amer: 73 mL/min — ABNORMAL LOW (ref >90)
Glucose, Bld: 166 mg/dL — ABNORMAL HIGH (ref 70–99)
POTASSIUM: 3.5 meq/L — AB (ref 3.7–5.3)
Sodium: 140 mEq/L (ref 137–147)
TOTAL PROTEIN: 7.8 g/dL (ref 6.0–8.3)

## 2014-05-05 LAB — CBC
HCT: 40.8 % (ref 36.0–46.0)
Hemoglobin: 13.5 g/dL (ref 12.0–15.0)
MCH: 30 pg (ref 26.0–34.0)
MCHC: 33.1 g/dL (ref 30.0–36.0)
MCV: 90.7 fL (ref 78.0–100.0)
PLATELETS: 220 10*3/uL (ref 150–400)
RBC: 4.5 MIL/uL (ref 3.87–5.11)
RDW: 12.5 % (ref 11.5–15.5)
WBC: 10.4 10*3/uL (ref 4.0–10.5)

## 2014-05-05 LAB — PROTIME-INR
INR: 1.09 (ref 0.00–1.49)
Prothrombin Time: 14.3 seconds (ref 11.6–15.2)

## 2014-05-05 LAB — LIPID PANEL
Cholesterol: 209 mg/dL — ABNORMAL HIGH (ref 0–200)
HDL: 48 mg/dL (ref >39)
LDL CALC: 136 mg/dL — AB (ref 0–99)
Total CHOL/HDL Ratio: 4.4 RATIO
Triglycerides: 125 mg/dL (ref <150)
VLDL: 25 mg/dL (ref 0–40)

## 2014-05-05 LAB — LIPASE, BLOOD: Lipase: >3000 U/L — ABNORMAL HIGH (ref 11–59)

## 2014-05-05 LAB — APTT: APTT: 33 s (ref 24–37)

## 2014-05-05 LAB — I-STAT TROPONIN, ED: TROPONIN I, POC: 0.01 ng/mL (ref 0.00–0.08)

## 2014-05-05 LAB — PRO B NATRIURETIC PEPTIDE: PRO B NATRI PEPTIDE: 247.6 pg/mL (ref 0–450)

## 2014-05-05 MED ORDER — DIPHENHYDRAMINE HCL 12.5 MG/5ML PO ELIX
12.5000 mg | ORAL_SOLUTION | Freq: Four times a day (QID) | ORAL | Status: DC | PRN
  Filled 2014-05-05: qty 5

## 2014-05-05 MED ORDER — HYDROMORPHONE HCL 1 MG/ML IJ SOLN
1.0000 mg | Freq: Once | INTRAMUSCULAR | Status: AC
  Administered 2014-05-05: 1 mg via INTRAVENOUS
  Filled 2014-05-05: qty 1

## 2014-05-05 MED ORDER — CETYLPYRIDINIUM CHLORIDE 0.05 % MT LIQD
7.0000 mL | Freq: Two times a day (BID) | OROMUCOSAL | Status: DC
  Administered 2014-05-05 – 2014-05-10 (×11): 7 mL via OROMUCOSAL

## 2014-05-05 MED ORDER — HYDROMORPHONE 0.3 MG/ML IV SOLN
INTRAVENOUS | Status: DC
  Administered 2014-05-05: 13:00:00 via INTRAVENOUS
  Administered 2014-05-05: 4.2 mL via INTRAVENOUS
  Administered 2014-05-06: 2 mg via INTRAVENOUS
  Administered 2014-05-06: 22:00:00 via INTRAVENOUS
  Administered 2014-05-06 (×2): 1.33 mL via INTRAVENOUS
  Administered 2014-05-07: 4 mg via INTRAVENOUS
  Administered 2014-05-07: 10.67 mg via INTRAVENOUS
  Administered 2014-05-07: 4.7 mg via INTRAVENOUS
  Administered 2014-05-08: 2.67 mg via INTRAVENOUS
  Administered 2014-05-08: 1.99 mg via INTRAVENOUS
  Administered 2014-05-08: 2.67 mg via INTRAVENOUS
  Administered 2014-05-08: 1.67 mg via INTRAVENOUS
  Filled 2014-05-05 (×3): qty 25

## 2014-05-05 MED ORDER — ONDANSETRON HCL 4 MG/2ML IJ SOLN: 4.0000 mg | Freq: Three times a day (TID) | INTRAMUSCULAR | Status: DC | PRN

## 2014-05-05 MED ORDER — ONDANSETRON HCL 4 MG/2ML IJ SOLN
4.0000 mg | Freq: Once | INTRAMUSCULAR | Status: AC
  Administered 2014-05-05: 4 mg via INTRAVENOUS
  Filled 2014-05-05: qty 2

## 2014-05-05 MED ORDER — SODIUM CHLORIDE 0.9 % IV SOLN: INTRAVENOUS | Status: DC

## 2014-05-05 MED ORDER — POLYETHYL GLYCOL-PROPYL GLYCOL 0.4-0.3 % OP SOLN: 1.0000 [drp] | Freq: Every day | OPHTHALMIC | Status: DC

## 2014-05-05 MED ORDER — MORPHINE SULFATE 4 MG/ML IJ SOLN
4.0000 mg | Freq: Once | INTRAMUSCULAR | Status: AC
  Administered 2014-05-05: 4 mg via INTRAVENOUS
  Filled 2014-05-05: qty 1

## 2014-05-05 MED ORDER — NALOXONE HCL 0.4 MG/ML IJ SOLN
0.4000 mg | INTRAMUSCULAR | Status: DC | PRN
Start: 2014-05-05 — End: 2014-05-08

## 2014-05-05 MED ORDER — DIPHENHYDRAMINE HCL 50 MG/ML IJ SOLN
12.5000 mg | Freq: Four times a day (QID) | INTRAMUSCULAR | Status: DC | PRN
Start: 2014-05-05 — End: 2014-05-08

## 2014-05-05 MED ORDER — POLYVINYL ALCOHOL 1.4 % OP SOLN
1.0000 [drp] | Freq: Every day | OPHTHALMIC | Status: DC
  Administered 2014-05-05 – 2014-05-10 (×5): 1 [drp] via OPHTHALMIC
  Filled 2014-05-05 (×2): qty 15

## 2014-05-05 MED ORDER — ACETAMINOPHEN 325 MG PO TABS: 650.0000 mg | ORAL_TABLET | Freq: Four times a day (QID) | ORAL | Status: DC | PRN

## 2014-05-05 MED ORDER — ONDANSETRON HCL 4 MG/2ML IJ SOLN: 4.0000 mg | Freq: Four times a day (QID) | INTRAMUSCULAR | Status: DC | PRN

## 2014-05-05 MED ORDER — LEVOTHYROXINE SODIUM 75 MCG PO TABS
75.0000 ug | ORAL_TABLET | Freq: Every day | ORAL | Status: DC
  Administered 2014-05-07 – 2014-05-10 (×4): 75 ug via ORAL
  Filled 2014-05-05 (×7): qty 1

## 2014-05-05 MED ORDER — PNEUMOCOCCAL VAC POLYVALENT 25 MCG/0.5ML IJ INJ
0.5000 mL | INJECTION | INTRAMUSCULAR | Status: AC
  Administered 2014-05-06: 0.5 mL via INTRAMUSCULAR
  Filled 2014-05-05: qty 0.5

## 2014-05-05 MED ORDER — SODIUM CHLORIDE 0.9 % IJ SOLN: 9.0000 mL | INTRAMUSCULAR | Status: DC | PRN

## 2014-05-05 MED ORDER — AMLODIPINE BESYLATE 5 MG PO TABS
5.0000 mg | ORAL_TABLET | Freq: Every day | ORAL | Status: DC
  Administered 2014-05-05 – 2014-05-09 (×5): 5 mg via ORAL
  Filled 2014-05-05 (×6): qty 1

## 2014-05-05 MED ORDER — HYDROMORPHONE HCL 1 MG/ML IJ SOLN: 1.0000 mg | INTRAMUSCULAR | Status: DC | PRN

## 2014-05-05 MED ORDER — HEPARIN SODIUM (PORCINE) 5000 UNIT/ML IJ SOLN
5000.0000 [IU] | Freq: Three times a day (TID) | INTRAMUSCULAR | Status: DC
  Administered 2014-05-05 – 2014-05-10 (×15): 5000 [IU] via SUBCUTANEOUS
  Filled 2014-05-05 (×17): qty 1

## 2014-05-05 MED ORDER — POLYETHYLENE GLYCOL 3350 17 G PO PACK
17.0000 g | PACK | Freq: Every day | ORAL | Status: AC
  Administered 2014-05-05 – 2014-05-07 (×2): 17 g via ORAL
  Filled 2014-05-05 (×3): qty 1

## 2014-05-05 MED ORDER — SODIUM CHLORIDE 0.9 % IV SOLN
INTRAVENOUS | Status: DC
  Administered 2014-05-05 – 2014-05-06 (×4): via INTRAVENOUS
  Administered 2014-05-06: 100 mL/h via INTRAVENOUS
  Administered 2014-05-07: 07:00:00 via INTRAVENOUS

## 2014-05-05 MED ORDER — ASPIRIN EC 81 MG PO TBEC
81.0000 mg | DELAYED_RELEASE_TABLET | Freq: Every day | ORAL | Status: DC
  Administered 2014-05-05 – 2014-05-10 (×6): 81 mg via ORAL
  Filled 2014-05-05 (×7): qty 1

## 2014-05-05 NOTE — Progress Notes (Signed)
Admission note:  Arrival Method: Patient arrived on a stretcher accompanied by the family and staff. Mental Orientation:  Alert and oriented x 4. Telemetry: Not on cardiac monitoring. Assessment: See doc Flow sheets. Skin: Warm, dry and intact.  IV: Left forearm. Pain: 5/10, in the upper abdomen. Fall Prevention Safety Plan: Patient educated on fall prevention safety plan, understood and acknowledged. Admission Screening: In progress. 6700 Orientation: Patient has been oriented to the unit, staff and to the room.

## 2014-05-05 NOTE — H&P (Addendum)
[facility] History and Physical  Carla Taylor TDD:[id number] DOB: [birth year] DOA: 05/05/2014  Referring physician: Dr. Tomi Bamberger PCP: [name], MD   Chief Complaint: Abdominal pain and nausea  HPI: Carla Taylor is a 80 y.o. female past medical history of hypertension that started having abdominal pain and nausea the day prior to admission. She relates nothing makes it better or worse. It has no radiation. Morphine was given to her when she got to the hospital which improved her pain. She relates no change in her medication, no recent alcohol use. No recent febrile travel history or change in her medication.  In the ED: Lipase was 3000 CT scan of the abdomen was done that showed no stone of inflammatory changes compatible with pancreatitis, abdominal ultrasound shows no stones and no dilation of the common bile duct.   Review of Systems:  Constitutional:  No weight loss, night sweats, Fevers, chills, fatigue.  HEENT:  No headaches, Difficulty swallowing,Tooth/dental problems,Sore throat,  No sneezing, itching, ear ache, nasal congestion, post nasal drip,  Cardio-vascular:  No chest pain, Orthopnea, PND, swelling in lower extremities, anasarca, dizziness, palpitations  GI:  No heartburn, indigestion, abdominal pain, nausea, vomiting, diarrhea, change in bowel habits, loss of appetite  Resp:  No shortness of breath with exertion or at rest. No excess mucus, no productive cough, No non-productive cough, No coughing up of blood.No change in color of mucus.No wheezing.No chest wall deformity  Skin:  no rash or lesions.  GU:  no dysuria, change in color of urine, no urgency or frequency. No flank pain.  Musculoskeletal:  No joint pain or swelling. No decreased range of motion. No back pain.  Psych:  No change in mood or affect. No depression or anxiety. No memory loss.   Past Medical History  Diagnosis Date  . Arthritis   . Hypertension   . Chronic shoulder pain     History reviewed. No pertinent past surgical history. Social History:  reports that she has never smoked. She does not have any smokeless tobacco history on file. She reports that she does not drink alcohol or use illicit drugs.  No Known Allergies  Family History  Problem Relation Age of Onset  . CVA Mother   . CVA Father      Prior to Admission medications   Medication Sig Start Date End Date Taking? Authorizing Provider  acetaminophen (TYLENOL) 325 MG tablet Take 650 mg by mouth every 6 (six) hours as needed for moderate pain (shoulder pain).   Yes Historical Provider, MD  amLODipine (NORVASC) 5 MG tablet Take 5 mg by mouth daily.   Yes Historical Provider, MD  aspirin EC 81 MG tablet Take 81 mg by mouth daily.   Yes Historical Provider, MD  calcium carbonate (OS-CAL - DOSED IN MG OF ELEMENTAL CALCIUM) 1250 MG tablet Take 1 tablet by mouth 2 (two) times daily with a meal.   Yes Historical Provider, MD  doxazosin (CARDURA) 1 MG tablet Take 1 mg by mouth daily.   Yes Historical Provider, MD  hydrochlorothiazide (HYDRODIURIL) 25 MG tablet Take 1 tablet (25 mg total) by mouth daily. 04/28/14  Yes Clayton Bibles, PA-C  ibuprofen (ADVIL,MOTRIN) 100 MG tablet Take 200 mg by mouth every 6 (six) hours as needed for pain or fever (shoulder pain).   Yes Historical Provider, MD  levothyroxine (SYNTHROID, LEVOTHROID) 75 MCG tablet Take 75 mcg by mouth daily before breakfast.   Yes Historical Provider, MD  Multiple Vitamins-Minerals (ICAPS AREDS FORMULA PO)  Take 1 tablet by mouth 2 (two) times daily.   Yes Historical Provider, MD  OxyCODONE HCl, Abuse Deter, 5 MG TABA Take 5 mg by mouth 4 (four) times daily as needed (pain).   Yes Historical Provider, MD  Polyethyl Glycol-Propyl Glycol (SYSTANE) 0.4-0.3 % SOLN Place 1 drop into both eyes daily.    Yes Historical Provider, MD  vitamin B-12 (CYANOCOBALAMIN) 500 MCG tablet Take 500 mcg by mouth daily.   Yes Historical Provider, MD  Vitamin D,  Ergocalciferol, (DRISDOL) 50000 UNITS CAPS capsule Take 50,000 Units by mouth every Sunday.   Yes Historical Provider, MD   Physical Exam: Filed Vitals:   05/05/14 0845 05/05/14 0900 05/05/14 0915 05/05/14 0957  BP: 153/63 166/58 164/58 119/102  Pulse: 67 64 63 65  Temp:    98.2 F (36.8 C)  TempSrc:    Oral  Resp: 16 17 16 22   Height:    5\' 1"  (1.549 m)  Weight:    86.1 kg (189 lb 13.1 oz)  SpO2: 97% 95% 97% 97%    Wt Readings from Last 3 Encounters:  05/05/14 86.1 kg (189 lb 13.1 oz)  03/21/14 83.915 kg (185 lb)    General:  Appears calm and comfortable Eyes: PERRL, normal lids, irises & conjunctiva ENT: grossly normal hearing, lips & tongue Neck: no LAD, masses or thyromegaly Cardiovascular: RRR, no m/r/g. No LE edema. Telemetry: SR, no arrhythmias  Respiratory: CTA bilaterally, no w/r/r. Normal respiratory effort. Abdomen: soft, epigastric tenderness no rebound or guarding. Skin: no rash or induration seen on limited exam Musculoskeletal: grossly normal tone BUE/BLE Psychiatric: grossly normal mood and affect, speech fluent and appropriate Neurologic: grossly non-focal.          Labs on Admission:  Basic Metabolic Panel:  Recent Labs Lab 04/28/14 1543 05/05/14 0445  NA 141 140  K 3.7 3.5*  CL 99 98  CO2 27 28  GLUCOSE 101* 166*  BUN 19 20  CREATININE 0.80 0.79  CALCIUM 10.6* 10.3   Liver Function Tests:  Recent Labs Lab 05/05/14 0445  AST 104*  ALT 84*  ALKPHOS 106  BILITOT 0.8  PROT 7.8  ALBUMIN 3.7    Recent Labs Lab 05/05/14 0445  LIPASE >3000*   No results found for this basename: AMMONIA,  in the last 168 hours CBC:  Recent Labs Lab 04/28/14 1543 05/05/14 0445  WBC 7.4 10.4  NEUTROABS 4.9  --   HGB 13.1 13.5  HCT 40.1 40.8  MCV 92.2 90.7  PLT 197 220   Cardiac Enzymes: No results found for this basename: CKTOTAL, CKMB, CKMBINDEX, TROPONINI,  in the last 168 hours  BNP (last 3 results)  Recent Labs  04/28/14 1543  05/05/14 0445  PROBNP 200.7 247.6   CBG: No results found for this basename: GLUCAP,  in the last 168 hours  Radiological Exams on Admission: Ct Abdomen Pelvis Wo Contrast  05/05/2014   CLINICAL DATA:  In ischial evaluation for acute onset severe abdominal pain  EXAM: CT ABDOMEN AND PELVIS WITHOUT CONTRAST  TECHNIQUE: Multidetector CT imaging of the abdomen and pelvis was performed following the standard protocol without IV contrast.  COMPARISON:  Prior in ultrasound from 12/12/2011  FINDINGS: Mild atelectatic changes noted within the visualized lung bases. There is mild cardiomegaly. No pleural pericardial effusion.  Limited noncontrast evaluation of the liver is unremarkable. Gallbladder within normal limits. Spleen and adrenal glands are unremarkable.  There is ill definition of the pancreas with prominent peripancreatic fat stranding, suggestive  of acute pancreatitis. No loculated fluid collections are identified. Evaluation for pancreatic necrosis limited on this noncontrast examination.  Kidneys within normal limits without evidence of nephrolithiasis or hydronephrosis. Cysts  Small hiatal hernia noted. Mild wall thickening with perienteric fat stranding about the duodenum present, felt to be reactive in nature to the adjacent inflammatory changes within the pancreas. No evidence of bowel obstruction. No other inflammatory changes seen about the bowels. Scattered sigmoid diverticulosis present without acute diverticulitis. Appendix is normal.  Bladder within normal limits. Small 9 mm hypodense nodular density noted anterior to the bladder (series 201, image 77). Finding is of of uncertain etiology, but of doubtful clinical significance.  Calcified fibroid present within the uterus. Ovaries within normal limits for patient age.  No free air or fluid. A few shotty peripancreatic lymph nodes noted, likely reactive. No pathologically enlarged lymph nodes seen within the abdomen and pelvis.  Extensive  atherosclerotic calcifications present throughout the intra-abdominal aorta. There is no associated aneurysm.  No acute osseous abnormality. No worrisome lytic or blastic osseous lesions. Prominent degenerative changes present at L4-5 and L5-S1. There is trace chronic anterolisthesis of L3 and L4.  IMPRESSION: 1. Findings consistent with acute pancreatitis. No complicating features identified on this non contrast examination. 2. Extensive atheromatous disease throughout the intra-abdominal aorta without associated aneurysm. 3. Sigmoid diverticulosis without acute diverticulitis. 4. Small hiatal hernia.   Electronically Signed   By: Jeannine Boga M.D.   On: 05/05/2014 06:13   Ct Head Wo Contrast  05/05/2014   CLINICAL DATA:  Transient ischemic attacks twice tonight, deficit unknown. Initial encounter.  EXAM: CT HEAD WITHOUT CONTRAST  TECHNIQUE: Contiguous axial images were obtained from the base of the skull through the vertex without intravenous contrast.  COMPARISON:  None.  FINDINGS: Skull and Sinuses:Negative for fracture or destructive process. The mastoids, middle ears, and imaged paranasal sinuses are clear.  Orbits: No acute findings. Status post cataract resection. Bilateral senescent calcifications.  Brain: No evidence of acute abnormality, such as acute infarction, hemorrhage, hydrocephalus, or mass lesion/mass effect. No brain atrophy. There is chronic small vessel disease with patchy ischemic gliosis in the bilateral cerebral white matter. Ischemic changes are focally notable around the frontal horn left lateral ventricle and in the left subinsular white matter.  IMPRESSION: 1. No evidence of acute intracranial disease. 2. Chronic small vessel disease.   Electronically Signed   By: Jorje Guild M.D.   On: 05/05/2014 06:02   US Abdomen Limited  05/05/2014   CLINICAL DATA:  Epigastric abdominal pain. Pancreatitis. Elevated LFTs. Question gallstones.  EXAM: US ABDOMEN LIMITED - RIGHT UPPER  QUADRANT  COMPARISON:  CT of the abdomen and pelvis 05/05/2014.  FINDINGS: Gallbladder:  No gallstones or wall thickening visualized. No sonographic Murphy sign noted. Wall thickness is 2.9 mm, the upper limits of normal.  Common bile duct:  Diameter: 7.0 mm, within normal limits for age.  Liver:  No focal lesion identified. Within normal limits in parenchymal echogenicity.  IMPRESSION: Negative right upper quadrant ultrasound.   Electronically Signed   By: Lawrence Santiago M.D.   On: 05/05/2014 07:39   Dg Chest Port 1 View  05/05/2014   CLINICAL DATA:  Chest pain and shortness of breath. Initial encounter.  EXAM: PORTABLE CHEST - 1 VIEW  COMPARISON:  04/28/2014  FINDINGS: There is mild cardiomegaly which is stable from prior. Mild widening of the upper mediastinum which is considered technical. The aorta is tortuous on a chronic basis. There is no edema,  consolidation, effusion, or pneumothorax. No osseous findings to explain chest pain  IMPRESSION: No active disease.   Electronically Signed   By: Jorje Guild M.D.   On: 05/05/2014 06:20    EKG: Independently reviewed. Normal sinus rhythm  Assessment/Plan Acute pancreatitis: - unclear etiology. - She denies any alcohol use, abdominal ultrasound and CT scan of the abdomen does not show any stones. Check a fasting lipid panel. Place her n.p.o., start on IV narcotics IV fluids and Zofran for nausea. - She has had no changes in her medication.  Essential hypertension - DC all medication discontinued and Norvasc.   Code Status: Full DVT Prophylaxis:heparin Family Communication: son Disposition Plan: inpatient  Time spent: 46 minutes  FELIZ Marguarite Arbour [facility] Pager [phone]

## 2014-05-05 NOTE — ED Provider Notes (Signed)
CSN: [id number]     Arrival date & time 05/05/14  0403 History   First MD Initiated Contact with Patient 05/05/14 0407     Chief Complaint  Patient presents with  . Chest Pain     (Consider location/radiation/quality/duration/timing/severity/associated sxs/prior Treatment) HPI Patient states she was sitting in her recliner about 9:15 this evening and had acute onset of severe epigastric pain. She states the pain is constant and is described as sharp. She's had some nausea and vomiting. She states she feels hot and cold. She did have one episode of diaphoresis. She has mild shortness of breath and she does relate deep breathing makes the pain worse. The pain does not radiate. She states she's never had this pain before. She states she took [place] without relief. Patient states she did her usual evening activities including taking care of her dog and finally at 2 AM she states the pain was not resolving so she called EMS. Patient reports her current pain is a 9/10. Per nursing staff EMS said patient had 2 brief episodes that they thought may have been a TIA where she was sitting in the chair and appeared to be correcting with her foot and was slipping. Patient does not recall this happening. EMS gave patient 324 mg aspirin and one nitroglycerin without any change in her discomfort.  Patient states she did not eat dinner. She reports she last ate about 2 PM at [employer] where she ate [place] with gravy and dressing and sweet potato casserole. She states she has eaten as before without problem.  Family history patient denies any history of coronary artery disease. Patient states her daughter had gallstones when she was only 55.  PCP Dr Moreen Fowler, states she saw him one week ago for elevated blood pressure.  She needs to have a shoulder replacement and has an appointment with Dr. Stanford Breed, cardiology in 2 weeks.  Past Medical History  Diagnosis Date  . Arthritis   . Hypertension   . Chronic  shoulder pain    History reviewed. No pertinent past surgical history. No family history on file. History  Substance Use Topics  . Smoking status: Never Smoker   . Smokeless tobacco: Not on file  . Alcohol Use: No   Lives at home Lives alone  OB History   Grav Para Term Preterm Abortions TAB SAB Ect Mult Living                 Review of Systems  All other systems reviewed and are negative.     Allergies  Review of patient's allergies indicates no known allergies.  Home Medications   Prior to Admission medications   Medication Sig Start Date End Date Taking? Authorizing Provider  acetaminophen (TYLENOL) 325 MG tablet Take 650 mg by mouth every 6 (six) hours as needed for moderate pain (shoulder pain).   Yes Historical Provider, MD  amLODipine (NORVASC) 5 MG tablet Take 5 mg by mouth daily.   Yes Historical Provider, MD  aspirin EC 81 MG tablet Take 81 mg by mouth daily.   Yes Historical Provider, MD  calcium carbonate (OS-CAL - DOSED IN MG OF ELEMENTAL CALCIUM) 1250 MG tablet Take 1 tablet by mouth 2 (two) times daily with a meal.   Yes Historical Provider, MD  doxazosin (CARDURA) 1 MG tablet Take 1 mg by mouth daily.   Yes Historical Provider, MD  hydrochlorothiazide (HYDRODIURIL) 25 MG tablet Take 1 tablet (25 mg total) by mouth daily. 04/28/14  Yes  Clayton Bibles, PA-C  ibuprofen (ADVIL,MOTRIN) 100 MG tablet Take 200 mg by mouth every 6 (six) hours as needed for pain or fever (shoulder pain).   Yes Historical Provider, MD  levothyroxine (SYNTHROID, LEVOTHROID) 75 MCG tablet Take 75 mcg by mouth daily before breakfast.   Yes Historical Provider, MD  Multiple Vitamins-Minerals (ICAPS AREDS FORMULA PO) Take 1 tablet by mouth 2 (two) times daily.   Yes Historical Provider, MD  OxyCODONE HCl, Abuse Deter, 5 MG TABA Take 5 mg by mouth 4 (four) times daily as needed (pain).   Yes Historical Provider, MD  Polyethyl Glycol-Propyl Glycol (SYSTANE) 0.4-0.3 % SOLN Place 1 drop into both  eyes daily.    Yes Historical Provider, MD  vitamin B-12 (CYANOCOBALAMIN) 500 MCG tablet Take 500 mcg by mouth daily.   Yes Historical Provider, MD  Vitamin D, Ergocalciferol, (DRISDOL) 50000 UNITS CAPS capsule Take 50,000 Units by mouth every Sunday.   Yes Historical Provider, MD   BP 184/68  Pulse 63  Temp(Src) 97.5 F (36.4 C) (Oral)  Resp 13  Ht 5\' 1"  (1.549 m)  Wt 190 lb (86.183 kg)  BMI 35.92 kg/m2  SpO2 100%  Vital signs normal   Physical Exam  Nursing note and vitals reviewed. Constitutional: She is oriented to person, place, and time. She appears well-developed and well-nourished.  Non-toxic appearance. She does not appear ill. No distress.  HENT:  Head: Normocephalic and atraumatic.  Right Ear: External ear normal.  Left Ear: External ear normal.  Nose: Nose normal. No mucosal edema or rhinorrhea.  Mouth/Throat: Oropharynx is clear and moist and mucous membranes are normal. No dental abscesses or uvula swelling.  Eyes: Conjunctivae and EOM are normal. Pupils are equal, round, and reactive to light.  Neck: Normal range of motion and full passive range of motion without pain. Neck supple.  Cardiovascular: Normal rate, regular rhythm and normal heart sounds.  Exam reveals no gallop and no friction rub.   No murmur heard. Pulmonary/Chest: Effort normal and breath sounds normal. No respiratory distress. She has no wheezes. She has no rhonchi. She has no rales. She exhibits no tenderness and no crepitus.  Abdominal: Soft. Normal appearance and bowel sounds are normal. She exhibits no distension. There is tenderness. There is no rebound and no guarding.    Very tender to palpation in the epigastric area with some tenderness in the left upper and right upper quadrant but not as sensitive  Musculoskeletal: Normal range of motion. She exhibits no edema and no tenderness.  Moves all extremities well.   Neurological: She is alert and oriented to person, place, and time. She has  normal strength. No cranial nerve deficit.  Skin: Skin is warm, dry and intact. No rash noted. No erythema. No pallor.  Psychiatric: She has a normal mood and affect. Her speech is normal and behavior is normal. Her mood appears not anxious.    ED Course  Procedures (including critical care time)  Medications  0.9 %  sodium chloride infusion ( Intravenous New Bag/Given 05/05/14 0457)  morphine 4 MG/ML injection 4 mg (not administered)  HYDROmorphone (DILAUDID) injection 1 mg (not administered)  morphine 4 MG/ML injection 4 mg (4 mg Intravenous Given 05/05/14 0500)  ondansetron (ZOFRAN) injection 4 mg (4 mg Intravenous Given 05/05/14 0458)    Patient was given IV fluids and IV pain and nausea medication.  Patient and son were given results of her tests. She is agreeable for admission.  08:13 Dr Aileen Fass, admit to [facility]  Labs Review Results for orders placed during the hospital encounter of 05/05/14  CBC      Result Value Ref Range   WBC 10.4  4.0 - 10.5 K/uL   RBC 4.50  3.87 - 5.11 MIL/uL   Hemoglobin 13.5  12.0 - 15.0 g/dL   HCT 40.8  36.0 - 46.0 %   MCV 90.7  78.0 - 100.0 fL   MCH 30.0  26.0 - 34.0 pg   MCHC 33.1  30.0 - 36.0 g/dL   RDW 12.5  11.5 - 15.5 %   Platelets 220  150 - 400 K/uL  PRO B NATRIURETIC PEPTIDE      Result Value Ref Range   Pro B Natriuretic peptide (BNP) 247.6  0 - 450 pg/mL  COMPREHENSIVE METABOLIC PANEL      Result Value Ref Range   Sodium 140  137 - 147 mEq/L   Potassium 3.5 (*) 3.7 - 5.3 mEq/L   Chloride 98  96 - 112 mEq/L   CO2 28  19 - 32 mEq/L   Glucose, Bld 166 (*) 70 - 99 mg/dL   BUN 20  6 - 23 mg/dL   Creatinine, Ser 0.79  0.50 - 1.10 mg/dL   Calcium 10.3  8.4 - 10.5 mg/dL   Total Protein 7.8  6.0 - 8.3 g/dL   Albumin 3.7  3.5 - 5.2 g/dL   AST 104 (*) 0 - 37 U/L   ALT 84 (*) 0 - 35 U/L   Alkaline Phosphatase 106  39 - 117 U/L   Total Bilirubin 0.8  0.3 - 1.2 mg/dL   GFR calc non Af Amer 73 (*) >90 mL/min   GFR calc Af Amer  84 (*) >90 mL/min   Anion gap 14  5 - 15  APTT      Result Value Ref Range   aPTT 33  24 - 37 seconds  PROTIME-INR      Result Value Ref Range   Prothrombin Time 14.3  11.6 - 15.2 seconds   INR 1.09  0.00 - 1.49  LIPASE, BLOOD      Result Value Ref Range   Lipase >3000 (*) 11 - 59 U/L  I-STAT TROPOININ, ED      Result Value Ref Range   Troponin i, poc 0.01  0.00 - 0.08 ng/mL   Comment 3             Laboratory interpretation all normal except elevation of LFTs, very elevated lipase    Imaging Review Ct Abdomen Pelvis Wo Contrast  05/05/2014   CLINICAL DATA:  In ischial evaluation for acute onset severe abdominal pain  EXAM: CT ABDOMEN AND PELVIS WITHOUT CONTRAST  TECHNIQUE: Multidetector CT imaging of the abdomen and pelvis was performed following the standard protocol without IV contrast.  COMPARISON:  Prior in ultrasound from 12/12/2011  FINDINGS: Mild atelectatic changes noted within the visualized lung bases. There is mild cardiomegaly. No pleural pericardial effusion.  Limited noncontrast evaluation of the liver is unremarkable. Gallbladder within normal limits. Spleen and adrenal glands are unremarkable.  There is ill definition of the pancreas with prominent peripancreatic fat stranding, suggestive of acute pancreatitis. No loculated fluid collections are identified. Evaluation for pancreatic necrosis limited on this noncontrast examination.  Kidneys within normal limits without evidence of nephrolithiasis or hydronephrosis. Cysts  Small hiatal hernia noted. Mild wall thickening with perienteric fat stranding about the duodenum present, felt to be reactive in nature to the adjacent inflammatory changes within the pancreas.  No evidence of bowel obstruction. No other inflammatory changes seen about the bowels. Scattered sigmoid diverticulosis present without acute diverticulitis. Appendix is normal.  Bladder within normal limits. Small 9 mm hypodense nodular density noted anterior to the  bladder (series 201, image 77). Finding is of of uncertain etiology, but of doubtful clinical significance.  Calcified fibroid present within the uterus. Ovaries within normal limits for patient age.  No free air or fluid. A few shotty peripancreatic lymph nodes noted, likely reactive. No pathologically enlarged lymph nodes seen within the abdomen and pelvis.  Extensive atherosclerotic calcifications present throughout the intra-abdominal aorta. There is no associated aneurysm.  No acute osseous abnormality. No worrisome lytic or blastic osseous lesions. Prominent degenerative changes present at L4-5 and L5-S1. There is trace chronic anterolisthesis of L3 and L4.  IMPRESSION: 1. Findings consistent with acute pancreatitis. No complicating features identified on this non contrast examination. 2. Extensive atheromatous disease throughout the intra-abdominal aorta without associated aneurysm. 3. Sigmoid diverticulosis without acute diverticulitis. 4. Small hiatal hernia.   Electronically Signed   By: Jeannine Boga M.D.   On: 05/05/2014 06:13   Ct Head Wo Contrast  05/05/2014   CLINICAL DATA:  Transient ischemic attacks twice tonight, deficit unknown. Initial encounter.  EXAM: CT HEAD WITHOUT CONTRAST  TECHNIQUE: Contiguous axial images were obtained from the base of the skull through the vertex without intravenous contrast.  COMPARISON:  None.  FINDINGS: Skull and Sinuses:Negative for fracture or destructive process. The mastoids, middle ears, and imaged paranasal sinuses are clear.  Orbits: No acute findings. Status post cataract resection. Bilateral senescent calcifications.  Brain: No evidence of acute abnormality, such as acute infarction, hemorrhage, hydrocephalus, or mass lesion/mass effect. No brain atrophy. There is chronic small vessel disease with patchy ischemic gliosis in the bilateral cerebral white matter. Ischemic changes are focally notable around the frontal horn left lateral ventricle and  in the left subinsular white matter.  IMPRESSION: 1. No evidence of acute intracranial disease. 2. Chronic small vessel disease.   Electronically Signed   By: Jorje Guild M.D.   On: 05/05/2014 06:02   US Abdomen Limited  05/05/2014   CLINICAL DATA:  Epigastric abdominal pain. Pancreatitis. Elevated LFTs. Question gallstones.  EXAM: US ABDOMEN LIMITED - RIGHT UPPER QUADRANT  COMPARISON:  CT of the abdomen and pelvis 05/05/2014.  FINDINGS: Gallbladder:  No gallstones or wall thickening visualized. No sonographic Murphy sign noted. Wall thickness is 2.9 mm, the upper limits of normal.  Common bile duct:  Diameter: 7.0 mm, within normal limits for age.  Liver:  No focal lesion identified. Within normal limits in parenchymal echogenicity.  IMPRESSION: Negative right upper quadrant ultrasound.   Electronically Signed   By: Lawrence Santiago M.D.   On: 05/05/2014 07:39   Dg Chest Port 1 View  05/05/2014   CLINICAL DATA:  Chest pain and shortness of breath. Initial encounter.  EXAM: PORTABLE CHEST - 1 VIEW  COMPARISON:  04/28/2014  FINDINGS: There is mild cardiomegaly which is stable from prior. Mild widening of the upper mediastinum which is considered technical. The aorta is tortuous on a chronic basis. There is no edema, consolidation, effusion, or pneumothorax. No osseous findings to explain chest pain  IMPRESSION: No active disease.   Electronically Signed   By: Jorje Guild M.D.   On: 05/05/2014 06:20     EKG Interpretation   Date/Time:  Friday May 05 2014 04:18:26 EDT Ventricular Rate:  70 PR Interval:  168 QRS Duration: 81 QT  Interval:  419 QTC Calculation: 452 R Axis:   29 Text Interpretation:  Sinus rhythm Since last tracing 28 Apr 2014 U waves  present Confirmed by Tauno Falotico  MD-I, Iya Hamed ([id number]) on 05/05/2014 5:56:19 AM      MDM   Final diagnoses:  Acute pancreatitis, unspecified pancreatitis type    Plan admission  Rolland Porter, MD, Alanson Aly, MD 05/05/14 [phone]

## 2014-05-05 NOTE — ED Notes (Signed)
X-ray phoned regarding status of portable chest.

## 2014-05-05 NOTE — ED Notes (Signed)
Patient transported to Ultrasound 
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
## 2014-05-05 NOTE — ED Notes (Signed)
Lab phoned and notified for a Lipase add on.

## 2014-05-05 NOTE — ED Notes (Addendum)
Dr.Knapp at bedside  

## 2014-05-06 ENCOUNTER — Inpatient Hospital Stay (HOSPITAL_COMMUNITY): Payer: Medicare Other

## 2014-05-06 DIAGNOSIS — K859 Acute pancreatitis, unspecified: Secondary | ICD-10-CM | POA: Diagnosis not present

## 2014-05-06 DIAGNOSIS — M19019 Primary osteoarthritis, unspecified shoulder: Secondary | ICD-10-CM | POA: Diagnosis present

## 2014-05-06 DIAGNOSIS — I10 Essential (primary) hypertension: Secondary | ICD-10-CM

## 2014-05-06 DIAGNOSIS — E039 Hypothyroidism, unspecified: Secondary | ICD-10-CM | POA: Diagnosis present

## 2014-05-06 LAB — BASIC METABOLIC PANEL
Anion gap: 10 (ref 5–15)
BUN: 14 mg/dL (ref 6–23)
CALCIUM: 8.8 mg/dL (ref 8.4–10.5)
CO2: 28 mEq/L (ref 19–32)
Chloride: 102 mEq/L (ref 96–112)
Creatinine, Ser: 0.79 mg/dL (ref 0.50–1.10)
GFR, EST AFRICAN AMERICAN: 84 mL/min — AB (ref >90)
GFR, EST NON AFRICAN AMERICAN: 73 mL/min — AB (ref >90)
Glucose, Bld: 91 mg/dL (ref 70–99)
Potassium: 3.4 mEq/L — ABNORMAL LOW (ref 3.7–5.3)
Sodium: 140 mEq/L (ref 137–147)

## 2014-05-06 LAB — LIPASE, BLOOD: Lipase: 962 U/L — ABNORMAL HIGH (ref 11–59)

## 2014-05-06 MED ORDER — GADOBENATE DIMEGLUMINE 529 MG/ML IV SOLN
18.0000 mL | Freq: Once | INTRAVENOUS | Status: AC | PRN
  Administered 2014-05-06: 18 mL via INTRAVENOUS

## 2014-05-06 MED ORDER — POTASSIUM CHLORIDE 10 MEQ/100ML IV SOLN
10.0000 meq | INTRAVENOUS | Status: AC
  Administered 2014-05-06 (×3): 10 meq via INTRAVENOUS
  Filled 2014-05-06 (×3): qty 100

## 2014-05-06 NOTE — Progress Notes (Signed)
[facility] PROGRESS NOTE  GENIENE LIST XKG:[id number] DOB: March 14, 1925 DOA: 05/05/2014 PCP: [name], MD  Assessment/Plan:  Principal Problem:   Acute pancreatitis: LFTs slightly elevated. [place] and CT without stone or obstruction. TG ok.  Recently, HCTZ increased from 12.5 to 25 mg due to swelling. May have contributed.  No h/o EtOH or previous pancreatitis. Will get MRCP r/o mass. Ice chips only Active Problems:   Essential hypertension: on norvasc. Thiazide held   Morbid obesity   Osteoarthritis, shoulder   Hypothyroidism: check TSH Hypokalemia: replete IV  Code Status:  full Family Communication:  Granddaughter at bedside Disposition Plan:  home  Consultants:    Procedures:     Antibiotics:    HPI/Subjective: "sore", but better. No n/v. Son had kidney cancer. Daughter had pancreatitic cancer  Objective: Filed Vitals:   05/06/14 0515  BP: 162/72  Pulse: 64  Temp: 98.4 F (36.9 C)  Resp: 17    Intake/Output Summary (Last 24 hours) at 05/06/14 1042 Last data filed at 05/06/14 0600  Gross per 24 hour  Intake 2511.86 ml  Output      0 ml  Net 2511.86 ml   Filed Weights   05/05/14 0427 05/05/14 0957 05/05/14 2055  Weight: 86.183 kg (190 lb) 86.1 kg (189 lb 13.1 oz) 86.002 kg (189 lb 9.6 oz)    Exam:   General:  Talkative. Comfortable. Morbidly obese  Cardiovascular: RRR without MGR  Respiratory: CTA without WRR  Abdomen: obese soft minimal epigastric tenderness  Ext: no CCE  Basic Metabolic Panel:  Recent Labs Lab 05/05/14 0445 05/06/14 0524  NA 140 140  K 3.5* 3.4*  CL 98 102  CO2 28 28  GLUCOSE 166* 91  BUN 20 14  CREATININE 0.79 0.79  CALCIUM 10.3 8.8   Liver Function Tests:  Recent Labs Lab 05/05/14 0445  AST 104*  ALT 84*  ALKPHOS 106  BILITOT 0.8  PROT 7.8  ALBUMIN 3.7    Recent Labs Lab 05/05/14 0445  LIPASE >3000*   No results found for this basename: AMMONIA,  in the last 168 hours CBC:  Recent  Labs Lab 05/05/14 0445  WBC 10.4  HGB 13.5  HCT 40.8  MCV 90.7  PLT 220   Cardiac Enzymes: No results found for this basename: CKTOTAL, CKMB, CKMBINDEX, TROPONINI,  in the last 168 hours BNP (last 3 results)  Recent Labs  04/28/14 1543 05/05/14 0445  PROBNP 200.7 247.6   Lipid Panel     Component Value Date/Time   CHOL 209* 05/05/2014 1017   TRIG 125 05/05/2014 1017   HDL 48 05/05/2014 1017   CHOLHDL 4.4 05/05/2014 1017   VLDL 25 05/05/2014 1017   LDLCALC 136* 05/05/2014 1017    CBG: No results found for this basename: GLUCAP,  in the last 168 hours  No results found for this or any previous visit (from the past 240 hour(s)).   Studies: Ct Abdomen Pelvis Wo Contrast  05/05/2014   CLINICAL DATA:  In ischial evaluation for acute onset severe abdominal pain  EXAM: CT ABDOMEN AND PELVIS WITHOUT CONTRAST  TECHNIQUE: Multidetector CT imaging of the abdomen and pelvis was performed following the standard protocol without IV contrast.  COMPARISON:  Prior in ultrasound from 12/12/2011  FINDINGS: Mild atelectatic changes noted within the visualized lung bases. There is mild cardiomegaly. No pleural pericardial effusion.  Limited noncontrast evaluation of the liver is unremarkable. Gallbladder within normal limits. Spleen and adrenal glands are unremarkable.  There is ill  definition of the pancreas with prominent peripancreatic fat stranding, suggestive of acute pancreatitis. No loculated fluid collections are identified. Evaluation for pancreatic necrosis limited on this noncontrast examination.  Kidneys within normal limits without evidence of nephrolithiasis or hydronephrosis. Cysts  Small hiatal hernia noted. Mild wall thickening with perienteric fat stranding about the duodenum present, felt to be reactive in nature to the adjacent inflammatory changes within the pancreas. No evidence of bowel obstruction. No other inflammatory changes seen about the bowels. Scattered sigmoid  diverticulosis present without acute diverticulitis. Appendix is normal.  Bladder within normal limits. Small 9 mm hypodense nodular density noted anterior to the bladder (series 201, image 77). Finding is of of uncertain etiology, but of doubtful clinical significance.  Calcified fibroid present within the uterus. Ovaries within normal limits for patient age.  No free air or fluid. A few shotty peripancreatic lymph nodes noted, likely reactive. No pathologically enlarged lymph nodes seen within the abdomen and pelvis.  Extensive atherosclerotic calcifications present throughout the intra-abdominal aorta. There is no associated aneurysm.  No acute osseous abnormality. No worrisome lytic or blastic osseous lesions. Prominent degenerative changes present at L4-5 and L5-S1. There is trace chronic anterolisthesis of L3 and L4.  IMPRESSION: 1. Findings consistent with acute pancreatitis. No complicating features identified on this non contrast examination. 2. Extensive atheromatous disease throughout the intra-abdominal aorta without associated aneurysm. 3. Sigmoid diverticulosis without acute diverticulitis. 4. Small hiatal hernia.   Electronically Signed   By: Jeannine Boga M.D.   On: 05/05/2014 06:13   Ct Head Wo Contrast  05/05/2014   CLINICAL DATA:  Transient ischemic attacks twice tonight, deficit unknown. Initial encounter.  EXAM: CT HEAD WITHOUT CONTRAST  TECHNIQUE: Contiguous axial images were obtained from the base of the skull through the vertex without intravenous contrast.  COMPARISON:  None.  FINDINGS: Skull and Sinuses:Negative for fracture or destructive process. The mastoids, middle ears, and imaged paranasal sinuses are clear.  Orbits: No acute findings. Status post cataract resection. Bilateral senescent calcifications.  Brain: No evidence of acute abnormality, such as acute infarction, hemorrhage, hydrocephalus, or mass lesion/mass effect. No brain atrophy. There is chronic small vessel  disease with patchy ischemic gliosis in the bilateral cerebral white matter. Ischemic changes are focally notable around the frontal horn left lateral ventricle and in the left subinsular white matter.  IMPRESSION: 1. No evidence of acute intracranial disease. 2. Chronic small vessel disease.   Electronically Signed   By: Jorje Guild M.D.   On: 05/05/2014 06:02   US Abdomen Limited  05/05/2014   CLINICAL DATA:  Epigastric abdominal pain. Pancreatitis. Elevated LFTs. Question gallstones.  EXAM: US ABDOMEN LIMITED - RIGHT UPPER QUADRANT  COMPARISON:  CT of the abdomen and pelvis 05/05/2014.  FINDINGS: Gallbladder:  No gallstones or wall thickening visualized. No sonographic Murphy sign noted. Wall thickness is 2.9 mm, the upper limits of normal.  Common bile duct:  Diameter: 7.0 mm, within normal limits for age.  Liver:  No focal lesion identified. Within normal limits in parenchymal echogenicity.  IMPRESSION: Negative right upper quadrant ultrasound.   Electronically Signed   By: Lawrence Santiago M.D.   On: 05/05/2014 07:39   Dg Chest Port 1 View  05/05/2014   CLINICAL DATA:  Chest pain and shortness of breath. Initial encounter.  EXAM: PORTABLE CHEST - 1 VIEW  COMPARISON:  04/28/2014  FINDINGS: There is mild cardiomegaly which is stable from prior. Mild widening of the upper mediastinum which is considered technical. The aorta  is tortuous on a chronic basis. There is no edema, consolidation, effusion, or pneumothorax. No osseous findings to explain chest pain  IMPRESSION: No active disease.   Electronically Signed   By: Jorje Guild M.D.   On: 05/05/2014 06:20    Scheduled Meds: . amLODipine  5 mg Oral Daily  . antiseptic oral rinse  7 mL Mouth Rinse BID  . aspirin EC  81 mg Oral Daily  . heparin  5,000 Units Subcutaneous 3 times per day  . HYDROmorphone PCA 0.3 mg/mL   Intravenous 6 times per day  . levothyroxine  75 mcg Oral QAC breakfast  . pneumococcal 23 valent vaccine  0.5 mL  Intramuscular Tomorrow-1000  . polyethylene glycol  17 g Oral Daily  . polyvinyl alcohol  1 drop Both Eyes Daily   Continuous Infusions: . sodium chloride 100 mL/hr at 05/05/14 2211    Time spent: 35 minutes  [facility] Pager [phone]. If 7PM-7AM, please contact night-coverage at [email or web address], password [facility] 05/06/2014, 10:42 AM  LOS: 1 day

## 2014-05-07 DIAGNOSIS — M19011 Primary osteoarthritis, right shoulder: Secondary | ICD-10-CM

## 2014-05-07 DIAGNOSIS — E876 Hypokalemia: Secondary | ICD-10-CM | POA: Diagnosis present

## 2014-05-07 LAB — COMPREHENSIVE METABOLIC PANEL
ALT: 26 U/L (ref 0–35)
ANION GAP: 11 (ref 5–15)
AST: 19 U/L (ref 0–37)
Albumin: 2.7 g/dL — ABNORMAL LOW (ref 3.5–5.2)
Alkaline Phosphatase: 88 U/L (ref 39–117)
BILIRUBIN TOTAL: 0.9 mg/dL (ref 0.3–1.2)
BUN: 12 mg/dL (ref 6–23)
CO2: 25 mEq/L (ref 19–32)
CREATININE: 0.7 mg/dL (ref 0.50–1.10)
Calcium: 8.5 mg/dL (ref 8.4–10.5)
Chloride: 103 mEq/L (ref 96–112)
GFR calc Af Amer: 88 mL/min — ABNORMAL LOW (ref >90)
GFR calc non Af Amer: 76 mL/min — ABNORMAL LOW (ref >90)
Glucose, Bld: 84 mg/dL (ref 70–99)
Potassium: 3.5 mEq/L — ABNORMAL LOW (ref 3.7–5.3)
Sodium: 139 mEq/L (ref 137–147)
TOTAL PROTEIN: 6.2 g/dL (ref 6.0–8.3)

## 2014-05-07 LAB — TSH: TSH: 3.7 u[IU]/mL (ref 0.350–4.500)

## 2014-05-07 LAB — MAGNESIUM: MAGNESIUM: 1.8 mg/dL (ref 1.5–2.5)

## 2014-05-07 LAB — LIPASE, BLOOD: Lipase: 97 U/L — ABNORMAL HIGH (ref 11–59)

## 2014-05-07 MED ORDER — DICLOFENAC SODIUM 1 % TD GEL
2.0000 g | Freq: Four times a day (QID) | TRANSDERMAL | Status: DC
  Administered 2014-05-07 – 2014-05-10 (×13): 2 g via TOPICAL
  Filled 2014-05-07: qty 100

## 2014-05-07 MED ORDER — POTASSIUM CHLORIDE 10 MEQ/100ML IV SOLN
10.0000 meq | INTRAVENOUS | Status: AC
  Administered 2014-05-07 (×3): 10 meq via INTRAVENOUS
  Filled 2014-05-07 (×3): qty 100

## 2014-05-07 NOTE — Progress Notes (Signed)
2.5 CC of hydromorphone pca left over wasted on sink as witnessed by Bryn Gulling R.N.

## 2014-05-07 NOTE — Progress Notes (Signed)
[facility] PROGRESS NOTE  Carla Taylor LOV:[id number] DOB: July 20, 1925 DOA: 05/05/2014 PCP: [name], MD  Assessment/Plan:  Principal Problem:   Acute pancreatitis: [place] and CT without stone or obstruction. TG ok.  Recently, HCTZ increased from 12.5 to 25 mg due to swelling. May have contributed.  No h/o EtOH or previous pancreatitis. MRCP shows no neoplasm or obstruction. Pain, lipase and lfts better. Start clears. Refer to GI as outpt to consider EUS Active Problems:   Essential hypertension: on norvasc. Thiazide held   Morbid obesity   Osteoarthritis, shoulder: this is patients main complaint today. Will start voltaren gel and OT consult. Needs ROM/strengthening. Increase activity   Hypothyroidism: TSH ok Hypokalemia: replete IV. mag ok  Code Status:  full Family Communication:  Granddaughter at bedside Disposition Plan:  home  Consultants:    Procedures:     Antibiotics:    HPI/Subjective: No abd pain or nausea. C/o right shoulder pain and left elbow pain  Objective: Filed Vitals:   05/07/14 0811  BP:   Pulse:   Temp:   Resp: 97    Intake/Output Summary (Last 24 hours) at 05/07/14 0834 Last data filed at 05/07/14 0553  Gross per 24 hour  Intake 2628.33 ml  Output      0 ml  Net 2628.33 ml   Filed Weights   05/05/14 0957 05/05/14 2055 2014-05-14 2032  Weight: 86.1 kg (189 lb 13.1 oz) 86.002 kg (189 lb 9.6 oz) 88.225 kg (194 lb 8 oz)    Exam:   General:  Talkative. Comfortable. Morbidly obese  Cardiovascular: RRR without MGR  Respiratory: CTA without WRR  Abdomen: obese soft minimal epigastric tenderness  Ext: no CCE  Msk: decreased ROM right shoulder due to pain. Tender. Left elbow nontender, full ROM  Basic Metabolic Panel:  Recent Labs Lab 05/05/14 0445 05-14-2014 0524 05/07/14 0524  NA 140 140 139  K 3.5* 3.4* 3.5*  CL 98 102 103  CO2 28 28 25   GLUCOSE 166* 91 84  BUN 20 14 12   CREATININE 0.79 0.79 0.70  CALCIUM 10.3  8.8 8.5  MG  --   --  1.8   Liver Function Tests:  Recent Labs Lab 05/05/14 0445 05/07/14 0524  AST 104* 19  ALT 84* 26  ALKPHOS 106 88  BILITOT 0.8 0.9  PROT 7.8 6.2  ALBUMIN 3.7 2.7*    Recent Labs Lab 05/05/14 0445 2014-05-14 0524 05/07/14 0524  LIPASE >3000* 962* 97*   No results found for this basename: AMMONIA,  in the last 168 hours CBC:  Recent Labs Lab 05/05/14 0445  WBC 10.4  HGB 13.5  HCT 40.8  MCV 90.7  PLT 220   Cardiac Enzymes: No results found for this basename: CKTOTAL, CKMB, CKMBINDEX, TROPONINI,  in the last 168 hours BNP (last 3 results)  Recent Labs  04/28/14 1543 05/05/14 0445  PROBNP 200.7 247.6   Lipid Panel     Component Value Date/Time   CHOL 209* 05/05/2014 1017   TRIG 125 05/05/2014 1017   HDL 48 05/05/2014 1017   CHOLHDL 4.4 05/05/2014 1017   VLDL 25 05/05/2014 1017   LDLCALC 136* 05/05/2014 1017    CBG: No results found for this basename: GLUCAP,  in the last 168 hours  No results found for this or any previous visit (from the past 240 hour(s)).   Studies: Mr 3d Recon At Scanner  2014/05/14   CLINICAL DATA:  Acute onset of epigastric pain. Evaluate for acute pancreatitis. LFT  slightly elevated. Ultrasound and CT negative for stone or biliary obstruction.  EXAM: MRI ABDOMEN WITHOUT AND WITH CONTRAST (INCLUDING MRCP)  TECHNIQUE: Multiplanar multisequence MR imaging of the abdomen was performed both before and after the administration of intravenous contrast. Heavily T2-weighted images of the biliary and pancreatic ducts were obtained, and three-dimensional MRCP images were rendered by post processing.  CONTRAST:  61mL MULTIHANCE GADOBENATE DIMEGLUMINE 529 MG/ML IV SOLN  COMPARISON:  CT from 05/05/2014.  FINDINGS: NOTE:  Exam detail is diminished due to motion artifact.  Lower chest: No pleural effusion identified. There is slight asymmetric elevation of the right hemidiaphragm noted.  Hepatobiliary: Mild hepatic steatosis. No  focal liver abnormality. Gallbladder wall edema and/or pericholecystic fluid noted. No gallstones identified. The common bile duct measures 7 mm in maximum diameter. No choledocholithiasis identified  Pancreas: There is the diffuse edema of the pancreas. Peripancreatic fat stranding is identified. Normal caliber of the pancreatic duct. No pancreatic mass identified. No evidence for pancreatic necrosis or pseudocyst formation  Spleen: Normal appearance of the spleen.  Adrenals/Urinary Tract: The adrenal glands are both unremarkable.  Vascular/Lymphatic: Normal caliber of the abdominal aorta. The IVC is normal. The superior mesenteric vein and portal venous confluence are patent. The splenic vein and portal vein are patent.  Other: Trace perihepatic ascites.  Musculoskeletal: Normal signal from within the bone marrow.  IMPRESSION: 1. Acute pancreatitis. Negative for pseudocyst or pancreatic necrosis. 2. Nonspecific gallbladder wall edema/pericholecystic fluid. No gallstones identified. There is no evidence for choledocholithiasis or biliary obstruction. 3. Mild perihepatic ascites.   Electronically Signed   By: Kerby Moors M.D.   On: 05/06/2014 17:37   Mr Jeananne Rama W/wo Cm/mrcp  05/06/2014   CLINICAL DATA:  Acute onset of epigastric pain. Evaluate for acute pancreatitis. LFT slightly elevated. Ultrasound and CT negative for stone or biliary obstruction.  EXAM: MRI ABDOMEN WITHOUT AND WITH CONTRAST (INCLUDING MRCP)  TECHNIQUE: Multiplanar multisequence MR imaging of the abdomen was performed both before and after the administration of intravenous contrast. Heavily T2-weighted images of the biliary and pancreatic ducts were obtained, and three-dimensional MRCP images were rendered by post processing.  CONTRAST:  45mL MULTIHANCE GADOBENATE DIMEGLUMINE 529 MG/ML IV SOLN  COMPARISON:  CT from 05/05/2014.  FINDINGS: NOTE:  Exam detail is diminished due to motion artifact.  Lower chest: No pleural effusion identified. There  is slight asymmetric elevation of the right hemidiaphragm noted.  Hepatobiliary: Mild hepatic steatosis. No focal liver abnormality. Gallbladder wall edema and/or pericholecystic fluid noted. No gallstones identified. The common bile duct measures 7 mm in maximum diameter. No choledocholithiasis identified  Pancreas: There is the diffuse edema of the pancreas. Peripancreatic fat stranding is identified. Normal caliber of the pancreatic duct. No pancreatic mass identified. No evidence for pancreatic necrosis or pseudocyst formation  Spleen: Normal appearance of the spleen.  Adrenals/Urinary Tract: The adrenal glands are both unremarkable.  Vascular/Lymphatic: Normal caliber of the abdominal aorta. The IVC is normal. The superior mesenteric vein and portal venous confluence are patent. The splenic vein and portal vein are patent.  Other: Trace perihepatic ascites.  Musculoskeletal: Normal signal from within the bone marrow.  IMPRESSION: 1. Acute pancreatitis. Negative for pseudocyst or pancreatic necrosis. 2. Nonspecific gallbladder wall edema/pericholecystic fluid. No gallstones identified. There is no evidence for choledocholithiasis or biliary obstruction. 3. Mild perihepatic ascites.   Electronically Signed   By: Kerby Moors M.D.   On: 05/06/2014 17:37    Scheduled Meds: . amLODipine  5 mg Oral Daily  .  antiseptic oral rinse  7 mL Mouth Rinse BID  . aspirin EC  81 mg Oral Daily  . diclofenac sodium  2 g Topical QID  . heparin  5,000 Units Subcutaneous 3 times per day  . HYDROmorphone PCA 0.3 mg/mL   Intravenous 6 times per day  . levothyroxine  75 mcg Oral QAC breakfast  . polyethylene glycol  17 g Oral Daily  . polyvinyl alcohol  1 drop Both Eyes Daily  . potassium chloride  10 mEq Intravenous Q1 Hr x 3   Continuous Infusions: . sodium chloride 100 mL/hr at 05/07/14 0720    Time spent: 25 minutes  [facility] Pager [phone]. If 7PM-7AM, please contact  night-coverage at [email or web address], password [facility] 05/07/2014, 8:34 AM  LOS: 2 days

## 2014-05-08 DIAGNOSIS — E876 Hypokalemia: Secondary | ICD-10-CM

## 2014-05-08 LAB — BASIC METABOLIC PANEL
Anion gap: 12 (ref 5–15)
BUN: 10 mg/dL (ref 6–23)
CALCIUM: 8.7 mg/dL (ref 8.4–10.5)
CO2: 24 mEq/L (ref 19–32)
Chloride: 102 mEq/L (ref 96–112)
Creatinine, Ser: 0.66 mg/dL (ref 0.50–1.10)
GFR calc Af Amer: 89 mL/min — ABNORMAL LOW (ref >90)
GFR, EST NON AFRICAN AMERICAN: 77 mL/min — AB (ref >90)
Glucose, Bld: 116 mg/dL — ABNORMAL HIGH (ref 70–99)
POTASSIUM: 3.8 meq/L (ref 3.7–5.3)
SODIUM: 138 meq/L (ref 137–147)

## 2014-05-08 LAB — LIPASE, BLOOD: Lipase: 27 U/L (ref 11–59)

## 2014-05-08 MED ORDER — SODIUM CHLORIDE 0.9 % IJ SOLN: 3.0000 mL | INTRAMUSCULAR | Status: DC | PRN

## 2014-05-08 MED ORDER — SODIUM CHLORIDE 0.9 % IJ SOLN
3.0000 mL | Freq: Two times a day (BID) | INTRAMUSCULAR | Status: DC
Start: 2014-05-08 — End: 2014-05-10
  Administered 2014-05-08 – 2014-05-09 (×3): 3 mL via INTRAVENOUS

## 2014-05-08 MED ORDER — SODIUM CHLORIDE 0.9 % IV SOLN: 250.0000 mL | INTRAVENOUS | Status: DC | PRN

## 2014-05-08 MED ORDER — OXYCODONE-ACETAMINOPHEN 5-325 MG PO TABS
1.0000 | ORAL_TABLET | Freq: Four times a day (QID) | ORAL | Status: DC | PRN
  Administered 2014-05-08: 1 via ORAL
  Administered 2014-05-09 (×2): 2 via ORAL
  Administered 2014-05-10: 1 via ORAL
  Filled 2014-05-08: qty 2
  Filled 2014-05-08: qty 1
  Filled 2014-05-08: qty 2
  Filled 2014-05-08: qty 1

## 2014-05-08 NOTE — Evaluation (Signed)
Physical Therapy Evaluation Patient Details Name: Carla Taylor MRN: [id number] DOB: [birth year] Today's Date: 05/08/2014   History of Present Illness  Pt is an 80 yo female admitted with stomach pain that acutely started last Thursday.  Pt found to have pancreatitis.  Pt has long standing pain in R shoulder.  Was scheduled for shoulder replacment on Aug 18 but that was cancelled due to needing a cardiac work up. Pt cont with pain in R shoulder.  Clinical Impression  Pt admitted with pancreatitis and R shoulder pain. Pt currently with functional limitations due to the deficits listed below (see PT Problem List). Pt will benefit from skilled PT to increase their independence and safety with mobility to allow discharge to the venue listed below. Pt would benefit from HHPT to work on strengthening and activity tolerance prior to R shoulder surgery.     Follow Up Recommendations Home health PT    Equipment Recommendations  None recommended by PT    Recommendations for Other Services       Precautions / Restrictions Precautions Precautions: Fall Restrictions Weight Bearing Restrictions: No      Mobility  Bed Mobility Overal bed mobility: Needs Assistance Bed Mobility: Supine to Sit     Supine to sit: Min assist;HOB elevated Sit to supine: Min assist   General bed mobility comments: sleeps in recliner at home  Transfers Overall transfer level: Needs assistance Equipment used: 1 person hand held assist Transfers: Sit to/from Stand Sit to Stand: Min guard;Min assist         General transfer comment: Min/guard from bed and MIN A from toilet  Ambulation/Gait Ambulation/Gait assistance: Min guard Ambulation Distance (Feet): 15 Feet (x 2) Assistive device: 1 person hand held assist Gait Pattern/deviations: Decreased step length - right;Decreased step length - left;Wide base of support Gait velocity: decreased   General Gait Details: Pt ambulating with MIN/guard with minimal  UE support at times and using counter for support  Stairs            Wheelchair Mobility    Modified Rankin (Stroke Patients Only)       Balance Overall balance assessment: Needs assistance Sitting-balance support: No upper extremity supported;Feet supported Sitting balance-Leahy Scale: Good     Standing balance support: During functional activity Standing balance-Leahy Scale: Fair Standing balance comment: Pt could stand for a few seconds with no outside support but she cannot take challenges.                             Pertinent Vitals/Pain Pain Assessment: 0-10 Pain Score: 8  Pain Location: R shoulder Pain Descriptors / Indicators: Aching;Sharp Pain Intervention(s): Repositioned;Limited activity within patient's tolerance;Monitored during session    [place] expects to be discharged to:: Private residence Living Arrangements: Alone Available Help at Discharge: Family;Available 24 hours/day (son coming up from [place] to stay with pt.) Type of Home: House Home Access: Stairs to enter Entrance Stairs-Rails: Psychiatric nurse of Steps: 5 Home Layout: One level Home Equipment: Shower seat      Prior Function Level of Independence: Independent         Comments: Pt very active, fully independent PTA and drives.     Hand Dominance   Dominant Hand: Right    Extremity/Trunk Assessment   Upper Extremity Assessment: Defer to OT evaluation RUE Deficits / Details: Pt with very painful RUE.  Pt does not tolerate much ROM.  Instructed pt's granddaughter  on gentle ROM to R shoulder and elbow.  Also positioned hand above heart on pillows due to swelling in R hand and arm.  Pt instructed on pumping exercises to relieve swelling in the RUE. RUE: Unable to fully assess due to pain       Lower Extremity Assessment: Overall WFL for tasks assessed;Generalized weakness      Cervical / Trunk Assessment: Normal   Communication   Communication: HOH  Cognition Arousal/Alertness: Awake/alert Behavior During Therapy: WFL for tasks assessed/performed Overall Cognitive Status: Within Functional Limits for tasks assessed                      General Comments General comments (skin integrity, edema, etc.): R UE swelling    Exercises General Exercises - Upper Extremity Shoulder Flexion: AAROM;5 reps;Supine Shoulder Extension: AROM;Right;5 reps;Supine Elbow Flexion: AAROM;5 reps;Supine;Right Elbow Extension: AROM;Right;5 reps;Supine Wrist Flexion: AROM;5 reps;Right;Supine Wrist Extension: AROM;Right;5 reps;Supine Digit Composite Flexion: AROM;5 reps;Right;Supine Composite Extension: AROM;Right;5 reps;Supine      Assessment/Plan    PT Assessment Patient needs continued PT services  PT Diagnosis Difficulty walking   PT Problem List Decreased strength;Decreased activity tolerance;Decreased balance;Decreased mobility  PT Treatment Interventions Gait training;Stair training;Functional mobility training;Therapeutic activities;Therapeutic exercise;Patient/family education   PT Goals (Current goals can be found in the Care Plan section) Acute Rehab PT Goals Patient Stated Goal: to get stronger before shoulder surgery PT Goal Formulation: With patient/family Time For Goal Achievement: 05/15/14 Potential to Achieve Goals: Good    Frequency Min 3X/week   Barriers to discharge        Co-evaluation               End of Session   Activity Tolerance: Patient tolerated treatment well Patient left: in chair;with family/visitor present;with call bell/phone within reach Nurse Communication: Mobility status         Time:  -      Charges:   PT Evaluation $Initial PT Evaluation Tier I: 1 Procedure PT Treatments $Gait Training: 8-22 mins   PT G Codes:          Sunita Demond LUBECK 05/08/2014, 1:32 PM

## 2014-05-08 NOTE — Progress Notes (Signed)
[facility] PROGRESS NOTE  Carla Taylor EQA:[id number] DOB: June 09, 1925 DOA: 05/05/2014 PCP: [name], MD Summary: 80 yo female presented with epigastric pain, N/V. Found to have pancreatitis. No previous h/o same.  Assessment/Plan:  Principal Problem:   Acute pancreatitis: [place] and CT without stone or obstruction. TG ok.  Recently, HCTZ increased from 12.5 to 25 mg due to swelling. May have contributed.  No h/o EtOH or previous pancreatitis. MRCP shows no neoplasm or obstruction. Pain, lipase and lfts better. Low fat diet. Refer to GI as outpt to consider EUS r/o microlithiasis. Since sees Dr. Moreen Fowler can see Dr. Paulita Fujita with [facility]. Saline lock. Change to po pain meds Active Problems:   Essential hypertension: on norvasc. Thiazide held. Could potentially resume HCTZ at 12.5 mg as outpt.   Morbid obesity   Osteoarthritis, shoulder: this remains patients main complaint. continue voltaren gel and OT consult. Needs ROM/strengthening. Increase activity. PT consult as well. Being followed by ortho as outpatient and patient hopes she will be cleared for surgery.   Hypothyroidism: TSH ok Hypokalemia: corrected  Code Status:  full Family Communication:  Granddaughter at bedside Disposition Plan:  Home tomorrow if stable. Will likely need continued outpt v. Home therapies.  Consultants:    Procedures:     Antibiotics:    HPI/Subjective: No abd pain or nausea. Elbow pain resolved. Right shoulder pain somewhat improved with voltarin gel.  Objective: Filed Vitals:   05/08/14 1000  BP: 163/56  Pulse: 78  Temp: 99.7 F (37.6 C)  Resp: 18    Intake/Output Summary (Last 24 hours) at 05/08/14 1100 Last data filed at 05/08/14 0730  Gross per 24 hour  Intake 689.03 ml  Output    200 ml  Net 489.03 ml   Filed Weights   05/05/14 2055 05/06/14 2032 05/07/14 2042  Weight: 86.002 kg (189 lb 9.6 oz) 88.225 kg (194 lb 8 oz) 90.22 kg (198 lb 14.4 oz)    Exam:   General:   Talkative. Comfortable in chair  Cardiovascular: RRR without MGR  Respiratory: CTA without WRR  Abdomen: obese soft nontender.  Ext: increased passive ROM right shoulder today  Basic Metabolic Panel:  Recent Labs Lab 05/05/14 0445 05/06/14 0524 05/07/14 0524 05/08/14 0547  NA 140 140 139 138  K 3.5* 3.4* 3.5* 3.8  CL 98 102 103 102  CO2 28 28 25 24   GLUCOSE 166* 91 84 116*  BUN 20 14 12 10   CREATININE 0.79 0.79 0.70 0.66  CALCIUM 10.3 8.8 8.5 8.7  MG  --   --  1.8  --    Liver Function Tests:  Recent Labs Lab 05/05/14 0445 05/07/14 0524  AST 104* 19  ALT 84* 26  ALKPHOS 106 88  BILITOT 0.8 0.9  PROT 7.8 6.2  ALBUMIN 3.7 2.7*    Recent Labs Lab 05/05/14 0445 05/06/14 0524 05/07/14 0524 05/08/14 0547  LIPASE >3000* 962* 97* 27   No results found for this basename: AMMONIA,  in the last 168 hours CBC:  Recent Labs Lab 05/05/14 0445  WBC 10.4  HGB 13.5  HCT 40.8  MCV 90.7  PLT 220   Cardiac Enzymes: No results found for this basename: CKTOTAL, CKMB, CKMBINDEX, TROPONINI,  in the last 168 hours BNP (last 3 results)  Recent Labs  04/28/14 1543 05/05/14 0445  PROBNP 200.7 247.6   Lipid Panel     Component Value Date/Time   CHOL 209* 05/05/2014 1017   TRIG 125 05/05/2014 1017  HDL 48 05/05/2014 1017   CHOLHDL 4.4 05/05/2014 1017   VLDL 25 05/05/2014 1017   LDLCALC 136* 05/05/2014 1017    CBG: No results found for this basename: GLUCAP,  in the last 168 hours  No results found for this or any previous visit (from the past 240 hour(s)).   Studies: Mr 3d Recon At Scanner  2014-05-08   CLINICAL DATA:  Acute onset of epigastric pain. Evaluate for acute pancreatitis. LFT slightly elevated. Ultrasound and CT negative for stone or biliary obstruction.  EXAM: MRI ABDOMEN WITHOUT AND WITH CONTRAST (INCLUDING MRCP)  TECHNIQUE: Multiplanar multisequence MR imaging of the abdomen was performed both before and after the administration of  intravenous contrast. Heavily T2-weighted images of the biliary and pancreatic ducts were obtained, and three-dimensional MRCP images were rendered by post processing.  CONTRAST:  73mL MULTIHANCE GADOBENATE DIMEGLUMINE 529 MG/ML IV SOLN  COMPARISON:  CT from 05/05/2014.  FINDINGS: NOTE:  Exam detail is diminished due to motion artifact.  Lower chest: No pleural effusion identified. There is slight asymmetric elevation of the right hemidiaphragm noted.  Hepatobiliary: Mild hepatic steatosis. No focal liver abnormality. Gallbladder wall edema and/or pericholecystic fluid noted. No gallstones identified. The common bile duct measures 7 mm in maximum diameter. No choledocholithiasis identified  Pancreas: There is the diffuse edema of the pancreas. Peripancreatic fat stranding is identified. Normal caliber of the pancreatic duct. No pancreatic mass identified. No evidence for pancreatic necrosis or pseudocyst formation  Spleen: Normal appearance of the spleen.  Adrenals/Urinary Tract: The adrenal glands are both unremarkable.  Vascular/Lymphatic: Normal caliber of the abdominal aorta. The IVC is normal. The superior mesenteric vein and portal venous confluence are patent. The splenic vein and portal vein are patent.  Other: Trace perihepatic ascites.  Musculoskeletal: Normal signal from within the bone marrow.  IMPRESSION: 1. Acute pancreatitis. Negative for pseudocyst or pancreatic necrosis. 2. Nonspecific gallbladder wall edema/pericholecystic fluid. No gallstones identified. There is no evidence for choledocholithiasis or biliary obstruction. 3. Mild perihepatic ascites.   Electronically Signed   By: Kerby Moors M.D.   On: 2014-05-08 17:37   Mr Jeananne Rama W/wo Cm/mrcp  2014/05/08   CLINICAL DATA:  Acute onset of epigastric pain. Evaluate for acute pancreatitis. LFT slightly elevated. Ultrasound and CT negative for stone or biliary obstruction.  EXAM: MRI ABDOMEN WITHOUT AND WITH CONTRAST (INCLUDING MRCP)  TECHNIQUE:  Multiplanar multisequence MR imaging of the abdomen was performed both before and after the administration of intravenous contrast. Heavily T2-weighted images of the biliary and pancreatic ducts were obtained, and three-dimensional MRCP images were rendered by post processing.  CONTRAST:  25mL MULTIHANCE GADOBENATE DIMEGLUMINE 529 MG/ML IV SOLN  COMPARISON:  CT from 05/05/2014.  FINDINGS: NOTE:  Exam detail is diminished due to motion artifact.  Lower chest: No pleural effusion identified. There is slight asymmetric elevation of the right hemidiaphragm noted.  Hepatobiliary: Mild hepatic steatosis. No focal liver abnormality. Gallbladder wall edema and/or pericholecystic fluid noted. No gallstones identified. The common bile duct measures 7 mm in maximum diameter. No choledocholithiasis identified  Pancreas: There is the diffuse edema of the pancreas. Peripancreatic fat stranding is identified. Normal caliber of the pancreatic duct. No pancreatic mass identified. No evidence for pancreatic necrosis or pseudocyst formation  Spleen: Normal appearance of the spleen.  Adrenals/Urinary Tract: The adrenal glands are both unremarkable.  Vascular/Lymphatic: Normal caliber of the abdominal aorta. The IVC is normal. The superior mesenteric vein and portal venous confluence are patent. The splenic vein and portal  vein are patent.  Other: Trace perihepatic ascites.  Musculoskeletal: Normal signal from within the bone marrow.  IMPRESSION: 1. Acute pancreatitis. Negative for pseudocyst or pancreatic necrosis. 2. Nonspecific gallbladder wall edema/pericholecystic fluid. No gallstones identified. There is no evidence for choledocholithiasis or biliary obstruction. 3. Mild perihepatic ascites.   Electronically Signed   By: Kerby Moors M.D.   On: 05/06/2014 17:37    Scheduled Meds: . amLODipine  5 mg Oral Daily  . antiseptic oral rinse  7 mL Mouth Rinse BID  . aspirin EC  81 mg Oral Daily  . diclofenac sodium  2 g Topical  QID  . heparin  5,000 Units Subcutaneous 3 times per day  . HYDROmorphone PCA 0.3 mg/mL   Intravenous 6 times per day  . levothyroxine  75 mcg Oral QAC breakfast  . polyvinyl alcohol  1 drop Both Eyes Daily   Continuous Infusions: . sodium chloride 100 mL/hr at 05/07/14 0720    Time spent: 25 minutes  [facility] Pager [phone]. If 7PM-7AM, please contact night-coverage at [email or web address], password [facility] 05/08/2014, 11:00 AM  LOS: 3 days

## 2014-05-08 NOTE — Evaluation (Signed)
Occupational Therapy Evaluation Patient Details Name: Carla Taylor MRN: [id number] DOB: [birth year] Today's Date: 05/08/2014    History of Present Illness Pt is an 80 yo female admitted with stomach pain that acutely started last Thursday.  Pt found to have pancreatitis.  Pt has long standing pain in R shoulder.  Was scheduled for shoulder replacment on Aug 18 but that was cancelled due to needing a cardiac work up. Pt cont with pain in R shoulder.   Clinical Impression   Pt admitted with the above diagnosis and has the deficits listed below. Pt would benefit from cont OT to increase I with adls and compensation techniques with adls since RUE is so painful.  Pt lives alone and son to come stay with her.  HHOT advantageous to attempt to reach mod I level of I with adls at home prior to shoulder surgery.     Follow Up Recommendations  Home health OT;Supervision/Assistance - 24 hour    Equipment Recommendations  None recommended by OT    Recommendations for Other Services       Precautions / Restrictions Precautions Precautions: Fall Restrictions Weight Bearing Restrictions: No      Mobility Bed Mobility Overal bed mobility: Needs Assistance Bed Mobility: Supine to Sit;Sit to Supine     Supine to sit: Min assist;HOB elevated Sit to supine: Min assist   General bed mobility comments: Pt limited by decreased use of RUE during transfers.  Transfers Overall transfer level: Needs assistance Equipment used: 1 person hand held assist Transfers: Sit to/from Stand Sit to Stand: Min guard         General transfer comment: Pt requires min guard to steady self once up, but after a few moments can maintain standing balance without assist and no challenges.    Balance Overall balance assessment: Needs assistance Sitting-balance support: No upper extremity supported;Feet supported Sitting balance-Leahy Scale: Good     Standing balance support: Bilateral upper extremity  supported;During functional activity Standing balance-Leahy Scale: Fair Standing balance comment: Pt could stand for a few seconds with no outside support but she cannot take challenges.                            ADL Overall ADL's : Needs assistance/impaired Eating/Feeding: Set up;Sitting   Grooming: Minimal assistance;Sitting Grooming Details (indicate cue type and reason): cannot use RUE to do grooming tasks at this time due to pain. Upper Body Bathing: Minimal assitance;Sitting Upper Body Bathing Details (indicate cue type and reason): limited by pain in R shoulder. Lower Body Bathing: Min guard;Sit to/from stand Lower Body Bathing Details (indicate cue type and reason): min guard for balance in standing. Upper Body Dressing : Minimal assistance;Sitting Upper Body Dressing Details (indicate cue type and reason): assist due to shoulder pain. Lower Body Dressing: Minimal assistance;Sit to/from stand Lower Body Dressing Details (indicate cue type and reason): min assist to donn socks and min assist for balance when standing. Toilet Transfer: Minimal assistance;Comfort height toilet;Ambulation Toilet Transfer Details (indicate cue type and reason): walked to bathroom with IV and min assist more to manage lines. Toileting- Clothing Manipulation and Hygiene: Minimal assistance;Sit to/from stand Toileting - Clothing Manipulation Details (indicate cue type and reason): min assist to maintain balance in standing     Functional mobility during ADLs: Minimal assistance General ADL Comments: Pt most limited in adls by the pain in her R shoulder. Otherwise, pt very independent. Son is driving up from [place]  to be with her until shoulder surgery.     Vision                     Perception     Praxis      Pertinent Vitals/Pain Pain Assessment: 0-10 Pain Score: 8  Pain Location: R shoulder. Pain Descriptors / Indicators: Aching;Sharp Pain Intervention(s): Limited  activity within patient's tolerance;Repositioned;Monitored during session;Relaxation     Hand Dominance Right   Extremity/Trunk Assessment Upper Extremity Assessment Upper Extremity Assessment: RUE deficits/detail RUE Deficits / Details: Pt with very painful RUE.  Pt does not tolerate much ROM.  Instructed pt's granddaughter on gentle ROM to R shoulder and elbow.  Also positioned hand above heart on pillows due to swelling in R hand and arm.  Pt instructed on pumping exercises to relieve swelling in the RUE. RUE: Unable to fully assess due to pain RUE Coordination: decreased fine motor;decreased gross motor   Lower Extremity Assessment Lower Extremity Assessment: Defer to PT evaluation   Cervical / Trunk Assessment Cervical / Trunk Assessment: Normal   Communication Communication Communication: HOH   Cognition Arousal/Alertness: Awake/alert Behavior During Therapy: WFL for tasks assessed/performed Overall Cognitive Status: Within Functional Limits for tasks assessed                     General Comments       Exercises Exercises: General Upper Extremity     Shoulder Instructions      Home Living Family/patient expects to be discharged to:: Private residence Living Arrangements: Alone Available Help at Discharge: Family;Available 24 hours/day (son coming up from [place] to stay with pt.) Type of Home: House Home Access: Stairs to enter [employer] of Steps: 5 Entrance Stairs-Rails: Right;Left Home Layout: One level     Bathroom Shower/Tub: Tub/shower unit;Curtain Shower/tub characteristics: Architectural technologist: Handicapped height     Home Equipment: Shower seat          Prior Functioning/Environment Level of Independence: Independent        Comments: Pt very active, fully independent PTA and drives.    OT Diagnosis: Generalized weakness;Acute pain   OT Problem List: Decreased strength;Decreased range of motion;Decreased activity  tolerance;Impaired balance (sitting and/or standing);Decreased coordination;Decreased knowledge of use of DME or AE;Pain;Impaired UE functional use   OT Treatment/Interventions: Self-care/ADL training;Therapeutic exercise;Therapeutic activities    OT Goals(Current goals can be found in the care plan section) Acute Rehab OT Goals Patient Stated Goal: to get my shoulder fixed. OT Goal Formulation: With patient Time For Goal Achievement: 05/22/14 Potential to Achieve Goals: Good ADL Goals Pt/caregiver will Perform Home Exercise Program: Increased ROM;Right Upper extremity Additional ADL Goal #1: Pt will ambulate to toilet and toilet with mod I.  OT Frequency: Min 2X/week   Barriers to D/C:    son coming to stay with her.       Co-evaluation              End of Session Equipment Utilized During Treatment: Oxygen Nurse Communication: Mobility status;Patient requests pain meds  Activity Tolerance: Patient limited by pain Patient left: in bed;with call bell/phone within reach;with family/visitor present   Time: 1100-1131 OT Time Calculation (min): 31 min Charges:  OT General Charges $OT Visit: 1 Procedure OT Evaluation $Initial OT Evaluation Tier I: 1 Procedure OT Treatments $Self Care/Home Management : 8-22 mins $Therapeutic Exercise: 8-22 mins G-Codes:    Glenford Peers May 23, 2014, 11:52 AM [phone]

## 2014-05-09 DIAGNOSIS — M7989 Other specified soft tissue disorders: Secondary | ICD-10-CM

## 2014-05-09 LAB — LIPASE, BLOOD: Lipase: 33 U/L (ref 11–59)

## 2014-05-09 NOTE — Progress Notes (Signed)
Utilization review completed.  

## 2014-05-09 NOTE — Progress Notes (Signed)
*  Preliminary Results* Right upper extremity venous duplex completed. Right upper extremity is negative for deep and superficial vein thrombosis.  05/09/2014 6:30 PM  Maudry Mayhew, RVT, RDCS, RDMS

## 2014-05-09 NOTE — Progress Notes (Signed)
[facility] PROGRESS NOTE  Carla Taylor XVQ:[id number] DOB: 23-Jan-1925 DOA: 05/05/2014 PCP: [name], MD Summary: 80 yo female presented with epigastric pain, N/V. Found to have pancreatitis. No previous h/o same.  Assessment/Plan:   Acute pancreatitis: [place] and CT without stone or obstruction. TG ok.  Recently, HCTZ increased from 12.5 to 25 mg due to swelling. May have contributed.  No h/o EtOH or previous pancreatitis. MRCP shows no neoplasm or obstruction. Pain, lipase and lfts better. Low fat diet. Refer to GI as outpt to consider EUS r/o microlithiasis. Since sees Dr. Moreen Fowler can see Dr. Paulita Fujita with [facility]. Saline lock. Change to po pain meds  Right arm swelling- suspect dependant edema -duplex to r/o DVT    Essential hypertension: on norvasc. Thiazide held. Could potentially resume HCTZ at 12.5 mg as outpt.   Morbid obesity   Osteoarthritis, shoulder: this remains patients main complaint. continue voltaren gel and OT consult. Needs ROM/strengthening. Increase activity. PT consult as well. Being followed by ortho as outpatient and patient hopes she will be cleared for surgery.   Hypothyroidism: TSH ok Hypokalemia: corrected  Code Status:  full Family Communication:  Granddaughter at bedside Disposition Plan:  Home after duplex if negative  Consultants:    Procedures:     Antibiotics:    HPI/Subjective: Right arm worsening swelling  Objective: Filed Vitals:   05/09/14 0915  BP: 171/74  Pulse: 24  Temp: 98.1 F (36.7 C)  Resp: 17    Intake/Output Summary (Last 24 hours) at 05/09/14 1046 Last data filed at 05/09/14 0920  Gross per 24 hour  Intake    120 ml  Output    450 ml  Net   -330 ml   Filed Weights   05/06/14 2032 05/07/14 2042 05/08/14 2125  Weight: 88.225 kg (194 lb 8 oz) 90.22 kg (198 lb 14.4 oz) 90.8 kg (200 lb 2.8 oz)    Exam:   General:  Talkative. Comfortable in chair- multiple complaints  Cardiovascular: RRR without  MGR  Respiratory: CTA without WRR  Abdomen: obese soft nontender.  Ext: right arm swelling and tightness  Basic Metabolic Panel:  Recent Labs Lab 05/05/14 0445 05/06/14 0524 05/07/14 0524 05/08/14 0547  NA 140 140 139 138  K 3.5* 3.4* 3.5* 3.8  CL 98 102 103 102  CO2 28 28 25 24   GLUCOSE 166* 91 84 116*  BUN 20 14 12 10   CREATININE 0.79 0.79 0.70 0.66  CALCIUM 10.3 8.8 8.5 8.7  MG  --   --  1.8  --    Liver Function Tests:  Recent Labs Lab 05/05/14 0445 05/07/14 0524  AST 104* 19  ALT 84* 26  ALKPHOS 106 88  BILITOT 0.8 0.9  PROT 7.8 6.2  ALBUMIN 3.7 2.7*    Recent Labs Lab 05/05/14 0445 05/06/14 0524 05/07/14 0524 05/08/14 0547 05/09/14 0622  LIPASE >3000* 962* 97* 27 33   No results found for this basename: AMMONIA,  in the last 168 hours CBC:  Recent Labs Lab 05/05/14 0445  WBC 10.4  HGB 13.5  HCT 40.8  MCV 90.7  PLT 220   Cardiac Enzymes: No results found for this basename: CKTOTAL, CKMB, CKMBINDEX, TROPONINI,  in the last 168 hours BNP (last 3 results)  Recent Labs  04/28/14 1543 05/05/14 0445  PROBNP 200.7 247.6   Lipid Panel     Component Value Date/Time   CHOL 209* 05/05/2014 1017   TRIG 125 05/05/2014 1017   HDL 48 05/05/2014  1017   CHOLHDL 4.4 05/05/2014 1017   VLDL 25 05/05/2014 1017   LDLCALC 136* 05/05/2014 1017    CBG: No results found for this basename: GLUCAP,  in the last 168 hours  No results found for this or any previous visit (from the past 240 hour(s)).   Studies: No results found.  Scheduled Meds: . amLODipine  5 mg Oral Daily  . antiseptic oral rinse  7 mL Mouth Rinse BID  . aspirin EC  81 mg Oral Daily  . diclofenac sodium  2 g Topical QID  . heparin  5,000 Units Subcutaneous 3 times per day  . levothyroxine  75 mcg Oral QAC breakfast  . polyvinyl alcohol  1 drop Both Eyes Daily  . sodium chloride  3 mL Intravenous Q12H   Continuous Infusions:    Time spent: 15 minutes  Taylor,  Carla  [facility] Pager [phone]. If 7PM-7AM, please contact night-coverage at [email or web address], password [facility] 05/09/2014, 10:46 AM  LOS: 4 days

## 2014-05-09 NOTE — Progress Notes (Signed)
CARE MANAGEMENT NOTE 05/09/2014  Patient:  Carla Taylor, Carla Taylor   Account Number:  [id number]  Date Initiated:  05/09/2014  Documentation initiated by:  Lizabeth Leyden  Subjective/Objective Assessment:   admitted with acute pancreatitis     Action/Plan:   progression of care and discharge planning   Anticipated DC Date:  05/09/2014   Anticipated DC Plan:  [place]  CM consult      [facility] [place] Choice  HOME HEALTH   Choice offered to / List presented to:  C-1 Patient        [place] arranged  [place]   Status of service:  Completed, signed off Medicare Important Message given?  YES (If response is "NO", the following Medicare IM given date fields will be blank) Date Medicare IM given:  05/09/2014 Medicare IM given by:  Lizabeth Leyden Date Additional Medicare IM given:   Additional Medicare IM given by:    Discharge Disposition:  [place]  Per UR Regulation:    If discussed at Long Length of Stay Meetings, dates discussed:    Comments:  05/09/2014  [place], [place] [phone] CM referral: home health PT, OT  Met with patient regarding home health services. She selected [place] home health agency.  Sherrin Daisy, NCM called with referral.

## 2014-05-10 MED ORDER — HYDROCODONE-ACETAMINOPHEN 5-300 MG PO TABS: 1.0000 | ORAL_TABLET | Freq: Four times a day (QID) | ORAL | Status: DC | PRN

## 2014-05-10 MED ORDER — POLYETHYLENE GLYCOL 3350 17 G PO PACK
17.0000 g | PACK | Freq: Every day | ORAL | Status: DC
  Administered 2014-05-10: 17 g via ORAL
  Filled 2014-05-10: qty 1

## 2014-05-10 MED ORDER — POLYETHYLENE GLYCOL 3350 17 G PO PACK: 17.0000 g | PACK | Freq: Every day | ORAL | Status: DC | PRN

## 2014-05-10 MED ORDER — AMLODIPINE BESYLATE 10 MG PO TABS
10.0000 mg | ORAL_TABLET | Freq: Every day | ORAL | Status: DC
  Administered 2014-05-10: 10 mg via ORAL
  Filled 2014-05-10: qty 1

## 2014-05-10 MED ORDER — AMLODIPINE BESYLATE 5 MG PO TABS: 10.0000 mg | ORAL_TABLET | Freq: Every day | ORAL | Status: DC

## 2014-05-10 MED ORDER — DICLOFENAC SODIUM 1 % TD GEL: 2.0000 g | Freq: Four times a day (QID) | TRANSDERMAL | Status: DC | PRN

## 2014-05-10 NOTE — Progress Notes (Signed)
Patient Discharge: Disposition: Patient discharged home with family Education: Educated patient about follow-up appointments, discharge instructions, medications, prescriptions, diet and a handout on acute pancreatitis. IV: Peripheral IV removed before discharge. Telemetry: Not applicable. Transportation: Patient transported in w/c with staff and family accompanied. Belongings: Patient took all her belongings with her.

## 2014-05-10 NOTE — Progress Notes (Signed)
Physical Therapy Treatment Patient Details Name: Carla Taylor MRN: [id number] DOB: 05/14/1925 Today's Date: 05/10/2014    History of Present Illness Pt is an 80 yo female admitted with stomach pain that acutely started last Thursday.  Pt found to have pancreatitis.  Pt has long standing pain in R shoulder.  Was scheduled for shoulder replacment on Aug 18 but that was cancelled due to needing a cardiac work up. Pt cont with pain in R shoulder.    PT Comments    Pt doing well, right UE less edematous, pain improved per pt;  Discussed pt amb with cane initially at home to avoid furniture walking and due to her being slightly more unsteady currently; will benefit from [place].  Follow Up Recommendations  Home health PT     Equipment Recommendations  None recommended by PT    Recommendations for Other Services       Precautions / Restrictions Precautions Precautions: Fall Restrictions Weight Bearing Restrictions: No    Mobility  Bed Mobility                  Transfers Overall transfer level: Needs assistance Equipment used: 1 person hand held assist;None Transfers: Sit to/from Stand Sit to Stand: Supervision         General transfer comment: incr time, close supervision d/t unsteady with initial standing  Ambulation/Gait Ambulation/Gait assistance: Min assist;Min guard Ambulation Distance (Feet): 90 Feet Assistive device: None;1 person hand held assist Gait Pattern/deviations: Step-through pattern;Decreased stride length;Drifts right/left Gait velocity: decreased   General Gait Details: pt with LOB x 1 and min/min-guard to recover; attempts to furniture walk and reportedly does this at home   Stairs            Wheelchair Mobility    Modified Rankin (Stroke Patients Only)       Balance             Standing balance-Leahy Scale: Fair               High level balance activites: Side stepping;Backward walking;Turns;Head turns       Cognition Arousal/Alertness: Awake/alert Behavior During Therapy: WFL for tasks assessed/performed Overall Cognitive Status: Within Functional Limits for tasks assessed                      Exercises General Exercises - Lower Extremity Long Arc Quad: AROM;Strengthening;Both;15 reps;Seated Other Exercises Other Exercises: isometric hip adduction with pillow x 10    General Comments General comments (skin integrity, edema, etc.): decr edema RUE; pt begins to return demo ex's taught by OT      Pertinent Vitals/Pain Pain Assessment: 0-10 Pain Score: 1  Pain Location: right shoulder Pain Intervention(s): Premedicated before session    Home Living                      Prior Function            PT Goals (current goals can now be found in the care plan section) Acute Rehab PT Goals Patient Stated Goal: to get stronger before shoulder surgery PT Goal Formulation: With patient/family Time For Goal Achievement: 05/15/14 Potential to Achieve Goals: Good Progress towards PT goals: Progressing toward goals    Frequency  Min 3X/week    PT Plan Current plan remains appropriate    Co-evaluation             End of Session   Activity Tolerance: Patient tolerated treatment well Patient  left: in chair;with family/visitor present;with call bell/phone within reach     Time: 1000-1018 PT Time Calculation (min): 18 min  Charges:  $Gait Training: 8-22 mins                    G Codes:      Dusan Lipford 05/30/2014, 10:29 AM

## 2014-05-13 DIAGNOSIS — I10 Essential (primary) hypertension: Secondary | ICD-10-CM | POA: Diagnosis not present

## 2014-05-13 DIAGNOSIS — R600 Localized edema: Secondary | ICD-10-CM | POA: Diagnosis not present

## 2014-05-13 DIAGNOSIS — M19011 Primary osteoarthritis, right shoulder: Secondary | ICD-10-CM | POA: Diagnosis not present

## 2014-05-13 DIAGNOSIS — R269 Unspecified abnormalities of gait and mobility: Secondary | ICD-10-CM | POA: Diagnosis not present

## 2014-05-13 DIAGNOSIS — Z9181 History of falling: Secondary | ICD-10-CM | POA: Diagnosis not present

## 2014-05-15 DIAGNOSIS — K859 Acute pancreatitis, unspecified: Secondary | ICD-10-CM | POA: Diagnosis not present

## 2014-05-16 DIAGNOSIS — M25511 Pain in right shoulder: Secondary | ICD-10-CM | POA: Diagnosis not present

## 2014-05-16 DIAGNOSIS — E559 Vitamin D deficiency, unspecified: Secondary | ICD-10-CM | POA: Diagnosis not present

## 2014-05-16 DIAGNOSIS — E039 Hypothyroidism, unspecified: Secondary | ICD-10-CM | POA: Diagnosis not present

## 2014-05-16 DIAGNOSIS — K859 Acute pancreatitis, unspecified: Secondary | ICD-10-CM | POA: Diagnosis not present

## 2014-05-16 DIAGNOSIS — I10 Essential (primary) hypertension: Secondary | ICD-10-CM | POA: Diagnosis not present

## 2014-05-16 DIAGNOSIS — R600 Localized edema: Secondary | ICD-10-CM | POA: Diagnosis not present

## 2014-05-16 DIAGNOSIS — R269 Unspecified abnormalities of gait and mobility: Secondary | ICD-10-CM | POA: Diagnosis not present

## 2014-05-16 DIAGNOSIS — M19011 Primary osteoarthritis, right shoulder: Secondary | ICD-10-CM | POA: Diagnosis not present

## 2014-05-16 DIAGNOSIS — Z9181 History of falling: Secondary | ICD-10-CM | POA: Diagnosis not present

## 2014-05-16 NOTE — Progress Notes (Signed)
HPI: 80 year old female for preoperative evaluation prior to shoulder surgery. Patient recently discharged following admission for pancreatitis. She is having significant right shoulder pain and will require shoulder replacement. She has dyspnea with more extreme activities but not routine activities. No orthopnea or PND. No chest pain. She has developed pedal edema since her recent hospitalization. We were asked to evaluate prior to her shoulder surgery.  Current Outpatient Prescriptions  Medication Sig Dispense Refill  . acetaminophen (TYLENOL) 325 MG tablet Take 650 mg by mouth every 6 (six) hours as needed for moderate pain (shoulder pain).      Marland Kitchen amLODipine (NORVASC) 5 MG tablet Take 2 tablets (10 mg total) by mouth daily.  30 tablet  0  . aspirin EC 81 MG tablet Take 81 mg by mouth daily.      . calcium carbonate (OS-CAL - DOSED IN MG OF ELEMENTAL CALCIUM) 1250 MG tablet Take 1 tablet by mouth 2 (two) times daily with a meal.      . diclofenac sodium (VOLTAREN) 1 % GEL Apply 2 g topically 4 (four) times daily as needed. For shoulder  1 Tube  0  . doxazosin (CARDURA) 1 MG tablet Take 1 mg by mouth daily.      . hydrochlorothiazide (HYDRODIURIL) 25 MG tablet Take 1 tablet by mouth daily.      . Hydrocodone-Acetaminophen 5-300 MG TABS Take 1 tablet by mouth every 6 (six) hours as needed.  30 each  0  . HYDROmorphone (DILAUDID) 2 MG tablet Take 1 tablet by mouth 4 (four) times daily as needed.      Marland Kitchen levothyroxine (SYNTHROID, LEVOTHROID) 75 MCG tablet Take 75 mcg by mouth daily before breakfast.      . Multiple Vitamins-Minerals (ICAPS AREDS FORMULA PO) Take 1 tablet by mouth 2 (two) times daily.      Vladimir Faster Glycol-Propyl Glycol (SYSTANE) 0.4-0.3 % SOLN Place 1 drop into both eyes daily.       . polyethylene glycol (MIRALAX / GLYCOLAX) packet Take 17 g by mouth daily as needed.    0  . vitamin B-12 (CYANOCOBALAMIN) 500 MCG tablet Take 500 mcg by mouth daily.      . Vitamin D,  Ergocalciferol, (DRISDOL) 50000 UNITS CAPS capsule Take 50,000 Units by mouth every Sunday.       No current facility-administered medications for this visit.    No Known Allergies  Past Medical History  Diagnosis Date  . Arthritis   . Hypertension   . Chronic shoulder pain   . Hyperlipidemia   . Hypothyroid   . Pancreatitis     Past Surgical History  Procedure Laterality Date  . No previous surgeries      History   Social History  . Marital Status: Widowed    Spouse Name: N/A    Number of Children: 4  . Years of Education: N/A   Occupational History  .  Ware   Social History Main Topics  . Smoking status: Never Smoker   . Smokeless tobacco: Not on file  . Alcohol Use: No  . Drug Use: No  . Sexual Activity: No   Other Topics Concern  . Not on file   Social History Narrative  . No narrative on file    Family History  Problem Relation Age of Onset  . CVA Mother   . CVA Father     ROS: Right shoulder pain and lower extremity edema but no fevers or  chills, productive cough, hemoptysis, dysphasia, odynophagia, melena, hematochezia, dysuria, hematuria, rash, seizure activity, orthopnea, PND, claudication. Remaining systems are negative.  Physical Exam:   Blood pressure 156/80, pulse 88, height 5' 0.5" (1.537 m), weight 185 lb 8 oz (84.142 kg).  General:  Well developed/well nourished in NAD Skin warm/dry Patient not depressed No peripheral clubbing Back-normal HEENT-normal/normal eyelids Neck supple/normal carotid upstroke bilaterally; no bruits; no JVD; no thyromegaly chest - CTA/ normal expansion CV - RRR/normal S1 and S2; no murmurs, rubs or gallops;  PMI nondisplaced Abdomen -NT/ND, no HSM, no mass, + bowel sounds, no bruit 2+ femoral pulses, no bruits Ext-21+ edema, no chords, 2+ DP Neuro-grossly nonfocal  ECG 05/05/2014-sinus rhythm, nonspecific ST changes.

## 2014-05-18 ENCOUNTER — Encounter: Payer: Self-pay | Admitting: *Deleted

## 2014-05-18 ENCOUNTER — Ambulatory Visit (INDEPENDENT_AMBULATORY_CARE_PROVIDER_SITE_OTHER): Payer: Medicare Other | Admitting: Cardiology

## 2014-05-18 ENCOUNTER — Encounter: Payer: Self-pay | Admitting: Cardiology

## 2014-05-18 VITALS — BP 156/80 | HR 88 | Ht 60.5 in | Wt 185.5 lb

## 2014-05-18 DIAGNOSIS — Z01810 Encounter for preprocedural cardiovascular examination: Secondary | ICD-10-CM | POA: Diagnosis not present

## 2014-05-18 DIAGNOSIS — Z01818 Encounter for other preprocedural examination: Secondary | ICD-10-CM

## 2014-05-18 DIAGNOSIS — R0600 Dyspnea, unspecified: Secondary | ICD-10-CM | POA: Diagnosis not present

## 2014-05-18 DIAGNOSIS — R609 Edema, unspecified: Secondary | ICD-10-CM | POA: Insufficient documentation

## 2014-05-18 LAB — BASIC METABOLIC PANEL WITH GFR
BUN: 20 mg/dL (ref 6–23)
CALCIUM: 10.3 mg/dL (ref 8.4–10.5)
CO2: 28 meq/L (ref 19–32)
CREATININE: 0.98 mg/dL (ref 0.50–1.10)
Chloride: 100 mEq/L (ref 96–112)
GFR, EST AFRICAN AMERICAN: 60 mL/min
GFR, Est Non African American: 52 mL/min — ABNORMAL LOW
Glucose, Bld: 118 mg/dL — ABNORMAL HIGH (ref 70–99)
Potassium: 3.5 mEq/L (ref 3.5–5.3)
Sodium: 140 mEq/L (ref 135–145)

## 2014-05-18 MED ORDER — METOPROLOL SUCCINATE ER 25 MG PO TB24: 25.0000 mg | ORAL_TABLET | Freq: Every day | ORAL | Status: DC

## 2014-05-18 NOTE — Assessment & Plan Note (Signed)
Patient is scheduled for right shoulder replacement. She has mild dyspnea on exertion. I will plan to proceed with a [place] nuclear study for risk stratification.

## 2014-05-18 NOTE — Assessment & Plan Note (Signed)
Blood pressure elevated. Add Toprol 25 mg daily. 

## 2014-05-18 NOTE — Assessment & Plan Note (Signed)
Patient has 1+ lower extremity edema which is new. She has been on Norvasc for quite some time and therefore this is unlikely causing. I wonder if she may have gotten significant amounts of IV fluids during recent hospitalization for pancreatitis and now has residual edema. Continue HCTZ. Schedule echocardiogram to assess LV function. Check renal function and BNP. I have asked her to keep her feet elevated during the day.

## 2014-05-18 NOTE — Patient Instructions (Signed)
Your physician recommends that you schedule a follow-up appointment in: [place] has requested that you have a lexiscan myoview. For further information please visit [email or web address]. Please follow instruction sheet, as given.   Your physician has requested that you have an echocardiogram. Echocardiography is a painless test that uses sound waves to create images of your heart. It provides your doctor with information about the size and shape of your heart and how well your heart's chambers and valves are working. This procedure takes approximately one hour. There are no restrictions for this procedure.   START METOPROLOL SUCC ER 25 MG ONCE DAILY  Your physician recommends that you HAVE LAB WORK TODAY

## 2014-05-19 DIAGNOSIS — I10 Essential (primary) hypertension: Secondary | ICD-10-CM | POA: Diagnosis not present

## 2014-05-19 DIAGNOSIS — R600 Localized edema: Secondary | ICD-10-CM | POA: Diagnosis not present

## 2014-05-19 DIAGNOSIS — M19011 Primary osteoarthritis, right shoulder: Secondary | ICD-10-CM | POA: Diagnosis not present

## 2014-05-19 DIAGNOSIS — R269 Unspecified abnormalities of gait and mobility: Secondary | ICD-10-CM | POA: Diagnosis not present

## 2014-05-19 DIAGNOSIS — Z9181 History of falling: Secondary | ICD-10-CM | POA: Diagnosis not present

## 2014-05-19 LAB — BRAIN NATRIURETIC PEPTIDE: BRAIN NATRIURETIC PEPTIDE: 26 pg/mL (ref 0.0–100.0)

## 2014-05-22 DIAGNOSIS — R269 Unspecified abnormalities of gait and mobility: Secondary | ICD-10-CM | POA: Diagnosis not present

## 2014-05-22 DIAGNOSIS — Z9181 History of falling: Secondary | ICD-10-CM | POA: Diagnosis not present

## 2014-05-22 DIAGNOSIS — R600 Localized edema: Secondary | ICD-10-CM | POA: Diagnosis not present

## 2014-05-22 DIAGNOSIS — M19011 Primary osteoarthritis, right shoulder: Secondary | ICD-10-CM | POA: Diagnosis not present

## 2014-05-22 DIAGNOSIS — I10 Essential (primary) hypertension: Secondary | ICD-10-CM | POA: Diagnosis not present

## 2014-05-23 DIAGNOSIS — Z9181 History of falling: Secondary | ICD-10-CM | POA: Diagnosis not present

## 2014-05-23 DIAGNOSIS — M19011 Primary osteoarthritis, right shoulder: Secondary | ICD-10-CM | POA: Diagnosis not present

## 2014-05-23 DIAGNOSIS — R600 Localized edema: Secondary | ICD-10-CM | POA: Diagnosis not present

## 2014-05-23 DIAGNOSIS — R269 Unspecified abnormalities of gait and mobility: Secondary | ICD-10-CM | POA: Diagnosis not present

## 2014-05-23 DIAGNOSIS — I10 Essential (primary) hypertension: Secondary | ICD-10-CM | POA: Diagnosis not present

## 2014-05-24 DIAGNOSIS — I10 Essential (primary) hypertension: Secondary | ICD-10-CM | POA: Diagnosis not present

## 2014-05-24 DIAGNOSIS — R269 Unspecified abnormalities of gait and mobility: Secondary | ICD-10-CM | POA: Diagnosis not present

## 2014-05-24 DIAGNOSIS — M19011 Primary osteoarthritis, right shoulder: Secondary | ICD-10-CM | POA: Diagnosis not present

## 2014-05-24 DIAGNOSIS — R600 Localized edema: Secondary | ICD-10-CM | POA: Diagnosis not present

## 2014-05-24 DIAGNOSIS — Z9181 History of falling: Secondary | ICD-10-CM | POA: Diagnosis not present

## 2014-05-25 DIAGNOSIS — I10 Essential (primary) hypertension: Secondary | ICD-10-CM | POA: Diagnosis not present

## 2014-05-25 DIAGNOSIS — Z9181 History of falling: Secondary | ICD-10-CM | POA: Diagnosis not present

## 2014-05-25 DIAGNOSIS — R600 Localized edema: Secondary | ICD-10-CM | POA: Diagnosis not present

## 2014-05-25 DIAGNOSIS — R269 Unspecified abnormalities of gait and mobility: Secondary | ICD-10-CM | POA: Diagnosis not present

## 2014-05-25 DIAGNOSIS — M19011 Primary osteoarthritis, right shoulder: Secondary | ICD-10-CM | POA: Diagnosis not present

## 2014-05-30 DIAGNOSIS — I10 Essential (primary) hypertension: Secondary | ICD-10-CM | POA: Diagnosis not present

## 2014-05-30 DIAGNOSIS — M19011 Primary osteoarthritis, right shoulder: Secondary | ICD-10-CM | POA: Diagnosis not present

## 2014-05-30 DIAGNOSIS — Z9181 History of falling: Secondary | ICD-10-CM | POA: Diagnosis not present

## 2014-05-30 DIAGNOSIS — R600 Localized edema: Secondary | ICD-10-CM | POA: Diagnosis not present

## 2014-05-30 DIAGNOSIS — R269 Unspecified abnormalities of gait and mobility: Secondary | ICD-10-CM | POA: Diagnosis not present

## 2014-06-01 ENCOUNTER — Telehealth (HOSPITAL_COMMUNITY): Payer: Self-pay

## 2014-06-01 DIAGNOSIS — M19011 Primary osteoarthritis, right shoulder: Secondary | ICD-10-CM | POA: Diagnosis not present

## 2014-06-01 DIAGNOSIS — Z7982 Long term (current) use of aspirin: Secondary | ICD-10-CM | POA: Diagnosis not present

## 2014-06-01 DIAGNOSIS — R600 Localized edema: Secondary | ICD-10-CM | POA: Diagnosis not present

## 2014-06-01 DIAGNOSIS — I10 Essential (primary) hypertension: Secondary | ICD-10-CM | POA: Diagnosis not present

## 2014-06-01 DIAGNOSIS — R269 Unspecified abnormalities of gait and mobility: Secondary | ICD-10-CM | POA: Diagnosis not present

## 2014-06-01 DIAGNOSIS — Z9181 History of falling: Secondary | ICD-10-CM | POA: Diagnosis not present

## 2014-06-01 NOTE — Telephone Encounter (Signed)
Encounter complete. 

## 2014-06-02 ENCOUNTER — Telehealth (HOSPITAL_COMMUNITY): Payer: Self-pay

## 2014-06-02 DIAGNOSIS — Z9181 History of falling: Secondary | ICD-10-CM | POA: Diagnosis not present

## 2014-06-02 DIAGNOSIS — R600 Localized edema: Secondary | ICD-10-CM | POA: Diagnosis not present

## 2014-06-02 DIAGNOSIS — M19011 Primary osteoarthritis, right shoulder: Secondary | ICD-10-CM | POA: Diagnosis not present

## 2014-06-02 DIAGNOSIS — I10 Essential (primary) hypertension: Secondary | ICD-10-CM | POA: Diagnosis not present

## 2014-06-02 DIAGNOSIS — R269 Unspecified abnormalities of gait and mobility: Secondary | ICD-10-CM | POA: Diagnosis not present

## 2014-06-02 NOTE — Telephone Encounter (Signed)
Encounter complete. 

## 2014-06-06 ENCOUNTER — Ambulatory Visit (HOSPITAL_COMMUNITY)
Admission: RE | Admit: 2014-06-06 | Discharge: 2014-06-06 | Disposition: A | Discharge status: Home / Self Care | Payer: Medicare Other | Source: Ambulatory Visit | Attending: Cardiovascular Disease | Admitting: Cardiovascular Disease

## 2014-06-06 ENCOUNTER — Ambulatory Visit (HOSPITAL_BASED_OUTPATIENT_CLINIC_OR_DEPARTMENT_OTHER)
Admission: RE | Admit: 2014-06-06 | Discharge: 2014-06-06 | Disposition: A | Discharge status: Home / Self Care | Payer: Medicare Other | Source: Ambulatory Visit | Attending: Cardiology | Admitting: Cardiology

## 2014-06-06 DIAGNOSIS — I059 Rheumatic mitral valve disease, unspecified: Secondary | ICD-10-CM

## 2014-06-06 DIAGNOSIS — E785 Hyperlipidemia, unspecified: Secondary | ICD-10-CM | POA: Diagnosis not present

## 2014-06-06 DIAGNOSIS — R609 Edema, unspecified: Secondary | ICD-10-CM

## 2014-06-06 DIAGNOSIS — R0600 Dyspnea, unspecified: Secondary | ICD-10-CM | POA: Diagnosis not present

## 2014-06-06 DIAGNOSIS — R0609 Other forms of dyspnea: Secondary | ICD-10-CM | POA: Insufficient documentation

## 2014-06-06 DIAGNOSIS — R9431 Abnormal electrocardiogram [ECG] [EKG]: Secondary | ICD-10-CM | POA: Diagnosis not present

## 2014-06-06 DIAGNOSIS — Z01818 Encounter for other preprocedural examination: Secondary | ICD-10-CM

## 2014-06-06 DIAGNOSIS — I10 Essential (primary) hypertension: Secondary | ICD-10-CM | POA: Diagnosis not present

## 2014-06-06 DIAGNOSIS — Z01810 Encounter for preprocedural cardiovascular examination: Secondary | ICD-10-CM

## 2014-06-06 DIAGNOSIS — E669 Obesity, unspecified: Secondary | ICD-10-CM | POA: Insufficient documentation

## 2014-06-06 MED ORDER — TECHNETIUM TC 99M SESTAMIBI GENERIC - CARDIOLITE
30.8000 | Freq: Once | INTRAVENOUS | Status: AC | PRN
  Administered 2014-06-06: 30.8 via INTRAVENOUS

## 2014-06-06 MED ORDER — REGADENOSON 0.4 MG/5ML IV SOLN
0.4000 mg | Freq: Once | INTRAVENOUS | Status: AC
  Administered 2014-06-06: 0.4 mg via INTRAVENOUS

## 2014-06-06 MED ORDER — TECHNETIUM TC 99M SESTAMIBI GENERIC - CARDIOLITE
10.3000 | Freq: Once | INTRAVENOUS | Status: AC | PRN
  Administered 2014-06-06: 10 via INTRAVENOUS

## 2014-06-06 NOTE — Discharge Summary (Signed)
Physician Discharge Summary  Carla Taylor IWO:[id number] DOB: 23-Sep-1924 DOA: 05/05/2014  PCP: [name], MD  Admit date: 05/05/2014 Discharge date: 05/10/2014  Time spent: 45 minutes  Recommendations for Outpatient Follow-up:  1. FU with [facility] as outpatient for pancreatitis 2. Monitor BP closely, HCTZ stopped  Discharge Diagnoses:  Principal Problem:   Acute pancreatitis Active Problems:   Essential hypertension   Morbid obesity   Osteoarthritis, shoulder   Hypothyroidism   Hypokalemia   Discharge Condition: stable  Diet recommendation: low sodium  Filed Weights   05/06/14 2032 05/07/14 2042 05/08/14 2125  Weight: 88.225 kg (194 lb 8 oz) 90.22 kg (198 lb 14.4 oz) 90.8 kg (200 lb 2.8 oz)    History of present illness:  80 yo female presented with epigastric pain, N/V. Found to have pancreatitis  Hospital Course:  Acute pancreatitis:  -improved with supportive care, symptom management -[place] and CT without stone or obstruction. TG ok. Recently, HCTZ increased from 12.5 to 25 mg due to swelling. May have contributed, hence stopped. No h/o EtOH or previous pancreatitis. MRCP shows no neoplasm or obstruction. Pain, lipase and lfts better.  -tolerating Low fat diet now -Refer to GI as outpt for further evaluation  Right arm swelling- suspect dependant edema -duplex negative for DVT   Essential hypertension: on norvasc. Thiazide stopped   Osteoarthritis, shoulder: this remains patients main complaint. continue voltaren gel and OT consult. Needs ROM/strengthening. Increase activity. PT consult as well. Being followed by ortho as outpatient and patient hopes she will be cleared for surgery .  Hypothyroidism: TSH ok Hypokalemia: corrected   Discharge Exam: Filed Vitals:   05/10/14 0900  BP: 135/67  Pulse: 69  Temp: 98.9 F (37.2 C)  Resp: 18    General: AAOx3 Cardiovascular: S1S2/RRR Respiratory: CTAB  Discharge Instructions You were cared for by a  hospitalist during your hospital stay. If you have any questions about your discharge medications or the care you received while you were in the hospital after you are discharged, you can call the unit and asked to speak with the hospitalist on call if the hospitalist that took care of you is not available. Once you are discharged, your primary care physician will handle any further medical issues. Please note that NO REFILLS for any discharge medications will be authorized once you are discharged, as it is imperative that you return to your primary care physician (or establish a relationship with a primary care physician if you do not have one) for your aftercare needs so that they can reassess your need for medications and monitor your lab values.  Discharge Instructions    Diet - low sodium heart healthy    Complete by:  As directed      Increase activity slowly    Complete by:  As directed           Discharge Medication List as of 05/10/2014  9:27 AM    START taking these medications   Details  diclofenac sodium (VOLTAREN) 1 % GEL Apply 2 g topically 4 (four) times daily as needed. For shoulder, Starting 05/10/2014, Until Discontinued, Print    Hydrocodone-Acetaminophen 5-300 MG TABS Take 1 tablet by mouth every 6 (six) hours as needed., Starting 05/10/2014, Until Discontinued, Print    polyethylene glycol (MIRALAX / GLYCOLAX) packet Take 17 g by mouth daily as needed., Starting 05/10/2014, Until Discontinued, No Print      CONTINUE these medications which have CHANGED   Details  amLODipine (NORVASC) 5  MG tablet Take 2 tablets (10 mg total) by mouth daily., Starting 05/10/2014, Until Discontinued, Print      CONTINUE these medications which have NOT CHANGED   Details  acetaminophen (TYLENOL) 325 MG tablet Take 650 mg by mouth every 6 (six) hours as needed for moderate pain (shoulder pain)., Until Discontinued, Historical Med    aspirin EC 81 MG tablet Take 81 mg by mouth daily.,  Until Discontinued, Historical Med    calcium carbonate (OS-CAL - DOSED IN MG OF ELEMENTAL CALCIUM) 1250 MG tablet Take 1 tablet by mouth 2 (two) times daily with a meal., Until Discontinued, Historical Med    doxazosin (CARDURA) 1 MG tablet Take 1 mg by mouth daily., Until Discontinued, Historical Med    levothyroxine (SYNTHROID, LEVOTHROID) 75 MCG tablet Take 75 mcg by mouth daily before breakfast., Until Discontinued, Historical Med    Multiple Vitamins-Minerals (ICAPS AREDS FORMULA PO) Take 1 tablet by mouth 2 (two) times daily., Until Discontinued, Historical Med    Polyethyl Glycol-Propyl Glycol (SYSTANE) 0.4-0.3 % SOLN Place 1 drop into both eyes daily. , Until Discontinued, Historical Med    vitamin B-12 (CYANOCOBALAMIN) 500 MCG tablet Take 500 mcg by mouth daily., Until Discontinued, Historical Med    Vitamin D, Ergocalciferol, (DRISDOL) 50000 UNITS CAPS capsule Take 50,000 Units by mouth every Sunday., Until Discontinued, Historical Med      STOP taking these medications     hydrochlorothiazide (HYDRODIURIL) 25 MG tablet      ibuprofen (ADVIL,MOTRIN) 100 MG tablet      OxyCODONE HCl, Abuse Deter, 5 MG TABA        No Known Allergies Follow-up Information    Follow up with [facility].   Why:  home health PT, OT   Contact information:   [address] [place] [address] [phone]       Follow up with [name], MD. Schedule an appointment as soon as possible for a visit in 1 week.   Specialty:  Family Medicine   Contact information:   [address] [place] [address] [phone]       Follow up with Landry Dyke, MD. Schedule an appointment as soon as possible for a visit in 3 weeks.   Specialty:  Gastroenterology   Contact information:   [address] [place] [address] [phone]        The results of significant diagnostics from this hospitalization (including imaging,  microbiology, ancillary and laboratory) are listed below for reference.    Significant Diagnostic Studies: No results found.  Microbiology: No results found for this or any previous visit (from the past 240 hour(s)).   Labs: Basic Metabolic Panel: No results for input(s): NA, K, CL, CO2, GLUCOSE, BUN, CREATININE, CALCIUM, MG, PHOS in the last 168 hours. Liver Function Tests: No results for input(s): AST, ALT, ALKPHOS, BILITOT, PROT, ALBUMIN in the last 168 hours. No results for input(s): LIPASE, AMYLASE in the last 168 hours. No results for input(s): AMMONIA in the last 168 hours. CBC: No results for input(s): WBC, NEUTROABS, HGB, HCT, MCV, PLT in the last 168 hours. Cardiac Enzymes: No results for input(s): CKTOTAL, CKMB, CKMBINDEX, TROPONINI in the last 168 hours. BNP: BNP (last 3 results)  Recent Labs  04/28/14 1543 05/05/14 0445  PROBNP 200.7 247.6   CBG: No results for input(s): GLUCAP in the last 168 hours.     SignedDomenic Polite  [facility] 06/06/2014, 3:56 PM

## 2014-06-06 NOTE — Procedures (Addendum)
[facility] [address] [place] [address] [phone]  Cardiology Nuclear Med Study  KEYMIAH LYLES is a 80 y.o. female     MRN : [id number]     DOB: 1925-05-02  Procedure Date: 06/06/2014  Nuclear Med Background Indication for Stress Test:  Surgical Clearance, [facility] and Abnormal EKG History:  No prior cardiac or respiratory history reported;No prior NUC MPI for comparison. Cardiac Risk Factors: Hypertension, Lipids and Obesity  Symptoms:  DOE and Fatigue   Nuclear Pre-Procedure Caffeine/Decaff Intake:  9:00pm NPO After: 7:00am   IV Site: R Forearm  IV 0.9% NS with Angio Cath:  22g  Chest Size (in):  n/a IV Started by: Rolene Course, RN  Height: 5' (1.524 m)  Cup Size: C  BMI:  Body mass index is 36.13 kg/(m^2). Weight:  185 lb (83.915 kg)   Tech Comments:  n/a    Nuclear Med Study 1 or 2 day study: 1 day  Stress Test Type:  [place] Provider:  Kirk Ruths, MD   Resting Radionuclide: Technetium 20m Sestamibi  Resting Radionuclide Dose: 10.3 mCi   Stress Radionuclide:  Technetium 62m Sestamibi  Stress Radionuclide Dose: 30.8 mCi           Stress Protocol Rest HR:65 Stress HR: 65  Rest BP: 155/65 Stress BP: 169/54  Exercise Time (min): n/a METS: n/a   Predicted Max HR: 133 bpm % Max HR: 51.88 bpm Rate Pressure Product: 11661  Dose of Adenosine (mg):  n/a Dose of Lexiscan: 0.4 mg  Dose of Atropine (mg): n/a Dose of Dobutamine: n/a mcg/kg/min (at max HR)  Stress Test Technologist: Leane Para, CCT Nuclear Technologist: Imagene Riches, CNMT   Rest Procedure:  Myocardial perfusion imaging was performed at rest 45 minutes following the intravenous administration of Technetium 5m Sestamibi. Stress Procedure:  The patient received IV Lexiscan 0.4 mg over 15-seconds.  Technetium 55m Sestamibi injected Iv at 30-seconds.  There were no significant changes with Lexiscan.  Quantitative  spect images were obtained after a 45 minute delay.  Transient Ischemic Dilatation (Normal <1.22):  1.18  QGS EDV:  77 ml QGS ESV:  23 ml LV Ejection Fraction: 71%  Rest ECG: NSR - Normal EKG  Stress ECG: No significant change from baseline ECG  QPS Raw Data Images:  Normal; no motion artifact; normal heart/lung ratio. Stress Images:  Normal homogeneous uptake in all areas of the myocardium. Rest Images:  Normal homogeneous uptake in all areas of the myocardium. Subtraction (SDS):  No evidence of ischemia.  Impression Exercise Capacity:  Lexiscan with no exercise. BP Response:  Normal blood pressure response. Clinical Symptoms:  There is dyspnea. ECG Impression:  No significant ECG changes with Lexiscan. Comparison with Prior Nuclear Study: No previous nuclear study performed  Overall Impression:  Normal stress nuclear study.  LV Wall Motion:  NL LV Function; NL Wall Motion; EF 71%  Pixie Casino, MD, [facility] Board Certified in Nuclear Cardiology Attending Cardiologist [place], MD  06/06/2014 12:19 PM

## 2014-06-06 NOTE — Progress Notes (Deleted)
2D Echo Performed 06/06/2014    Marygrace Drought, RCS

## 2014-06-06 NOTE — Progress Notes (Signed)
2D Echo Performed 06/06/2014    Marygrace Drought, RCS

## 2014-06-07 ENCOUNTER — Encounter: Payer: Self-pay | Admitting: *Deleted

## 2014-06-07 ENCOUNTER — Telehealth: Payer: Self-pay | Admitting: *Deleted

## 2014-06-07 NOTE — Telephone Encounter (Signed)
Clearance letter for right shoulder replacement faxed to the number provided.

## 2014-06-12 DIAGNOSIS — M19011 Primary osteoarthritis, right shoulder: Secondary | ICD-10-CM | POA: Diagnosis not present

## 2014-06-23 ENCOUNTER — Telehealth: Payer: Self-pay | Admitting: Cardiology

## 2014-06-23 MED ORDER — AMLODIPINE BESYLATE 10 MG PO TABS: 10.0000 mg | ORAL_TABLET | Freq: Every day | ORAL | Status: DC

## 2014-06-23 NOTE — Telephone Encounter (Signed)
Pt called in stating that she had been on Amlodipine for a number of years and was taking that once a day. She was hospitalized on 10/16-21 and while she was there, her dosage was increased to tid. She came in to see Dr. Stanford Breed as a NP on 10/29 and he prescribed Metoprolol for her to take. She would like to know if she should stop taking the Amlodipine or go backto her original dosage. Please call  thanks

## 2014-06-23 NOTE — Telephone Encounter (Signed)
Spoke with pt, aware she is to take both amlodipine 10 mg and metoprolol. Patient voiced understanding

## 2014-06-26 ENCOUNTER — Other Ambulatory Visit: Payer: Self-pay

## 2014-06-26 ENCOUNTER — Telehealth: Payer: Self-pay | Admitting: Cardiology

## 2014-06-26 MED ORDER — AMLODIPINE BESYLATE 10 MG PO TABS: 10.0000 mg | ORAL_TABLET | Freq: Every day | ORAL | Status: DC

## 2014-06-26 MED ORDER — ACETAMINOPHEN 325 MG PO TABS: 650.0000 mg | ORAL_TABLET | Freq: Four times a day (QID) | ORAL | Status: DC | PRN

## 2014-06-26 NOTE — Telephone Encounter (Signed)
Pt says she still have not received her Amlopidine. Would you please call it in today to [facility] [phone].

## 2014-06-27 ENCOUNTER — Other Ambulatory Visit: Payer: Self-pay

## 2014-06-27 MED ORDER — AMLODIPINE BESYLATE 10 MG PO TABS: 10.0000 mg | ORAL_TABLET | Freq: Every day | ORAL | Status: DC

## 2014-06-27 NOTE — Telephone Encounter (Signed)
Rx was sent to pharmacy electronically. 

## 2014-08-09 NOTE — Progress Notes (Signed)
HPI: 81 year old female for follow-up of hypertension and edema. Previously seen for preoperative evaluation. Echocardiogram November 2015 showed vigorous LV function, grade 1 diastolic dysfunction, mild mitral regurgitation and mild tricuspid regurgitation. Mildly elevated pulmonary pressures. Nuclear study November 2015 showed an ejection fraction of 71% and normal perfusion. At last office visit we added Toprol for elevated blood pressure. We felt IV fluids administered during previous hospitalization may have contributed to her lower extremity edema and continued HCTZ. BNP 26. Since I last saw her, she denies dyspnea, chest pain, palpitations or syncope. She continues to have pedal edema. Note she states she was on 5 mg of amlodipine prior to her previous hospitalization last fall and that was increased to 10 mg. She noticed her edema shortly thereafter.  Current Outpatient Prescriptions  Medication Sig Dispense Refill  . acetaminophen (TYLENOL) 325 MG tablet Take 2 tablets (650 mg total) by mouth every 6 (six) hours as needed for moderate pain (shoulder pain). 240 tablet 3  . amLODipine (NORVASC) 10 MG tablet Take 1 tablet (10 mg total) by mouth daily. 90 tablet 3  . aspirin EC 81 MG tablet Take 81 mg by mouth daily.    . calcium carbonate (OS-CAL - DOSED IN MG OF ELEMENTAL CALCIUM) 1250 MG tablet Take 1 tablet by mouth 2 (two) times daily with a meal.    . doxazosin (CARDURA) 1 MG tablet Take 1 mg by mouth daily.    . hydrochlorothiazide (HYDRODIURIL) 25 MG tablet Take 1 tablet by mouth daily.    Marland Kitchen levothyroxine (SYNTHROID, LEVOTHROID) 75 MCG tablet Take 75 mcg by mouth daily before breakfast.    . metoprolol succinate (TOPROL XL) 25 MG 24 hr tablet Take 1 tablet (25 mg total) by mouth daily. 90 tablet 3  . Multiple Vitamins-Minerals (ICAPS AREDS FORMULA PO) Take 1 tablet by mouth 2 (two) times daily.    Vladimir Faster Glycol-Propyl Glycol (SYSTANE) 0.4-0.3 % SOLN Place 1 drop into both  eyes as needed.     . polyethylene glycol (MIRALAX / GLYCOLAX) packet Take 17 g by mouth daily as needed.  0  . vitamin B-12 (CYANOCOBALAMIN) 500 MCG tablet Take 500 mcg by mouth daily.    . Vitamin D, Ergocalciferol, (DRISDOL) 50000 UNITS CAPS capsule Take 50,000 Units by mouth every Sunday.     No current facility-administered medications for this visit.     Past Medical History  Diagnosis Date  . Arthritis   . Hypertension   . Chronic shoulder pain   . Hyperlipidemia   . Hypothyroid   . Pancreatitis     Past Surgical History  Procedure Laterality Date  . No previous surgeries      History   Social History  . Marital Status: Widowed    Spouse Name: N/A    Number of Children: 4  . Years of Education: N/A   Occupational History  .  [employer]   Social History Main Topics  . Smoking status: Never Smoker   . Smokeless tobacco: Not on file  . Alcohol Use: No  . Drug Use: No  . Sexual Activity: No   Other Topics Concern  . Not on file   Social History Narrative    ROS: no fevers or chills, productive cough, hemoptysis, dysphasia, odynophagia, melena, hematochezia, dysuria, hematuria, rash, seizure activity, orthopnea, PND, claudication. Remaining systems are negative.  Physical Exam: Well-developed well-nourished in no acute distress.  Skin is warm and dry.  HEENT is  normal.  Neck is supple.  Chest is clear to auscultation with normal expansion.  Cardiovascular exam is regular rate and rhythm.  Abdominal exam nontender or distended. No masses palpated. Extremities show 1+ ankle edema. neuro grossly intact

## 2014-08-14 ENCOUNTER — Ambulatory Visit (INDEPENDENT_AMBULATORY_CARE_PROVIDER_SITE_OTHER): Payer: Medicare Other | Admitting: Cardiology

## 2014-08-14 ENCOUNTER — Encounter: Payer: Self-pay | Admitting: Cardiology

## 2014-08-14 VITALS — BP 160/72 | HR 74 | Ht 62.0 in | Wt 189.2 lb

## 2014-08-14 DIAGNOSIS — Z01810 Encounter for preprocedural cardiovascular examination: Secondary | ICD-10-CM | POA: Diagnosis not present

## 2014-08-14 DIAGNOSIS — I10 Essential (primary) hypertension: Secondary | ICD-10-CM

## 2014-08-14 MED ORDER — METOPROLOL SUCCINATE ER 50 MG PO TB24: 50.0000 mg | ORAL_TABLET | Freq: Every day | ORAL | Status: DC

## 2014-08-14 MED ORDER — LISINOPRIL 20 MG PO TABS
20.0000 mg | ORAL_TABLET | Freq: Every day | ORAL | Status: DC
Start: 2014-08-14 — End: 2014-09-13

## 2014-08-14 NOTE — Patient Instructions (Signed)
Your physician recommends that you schedule a follow-up appointment in: 4 WEEKS WITH DR CRENSHAW  STOP AMLODIPINE  START LISINOPRIL 20 MG ONCE DAILY  INCREASE METOPROLOL SUCC ER TO 50 MG ONCE DAILY=2 OF THE 25 MG TABLETS ONCE DAILY  Your physician recommends that you return for lab work in: [place]

## 2014-08-14 NOTE — Assessment & Plan Note (Signed)
Recent nuclear study negative. She may proceed with surgery for her shoulder.

## 2014-08-14 NOTE — Assessment & Plan Note (Signed)
Blood pressure is elevated. She also continues to have pedal edema. I think this is most likely related to her amlodipine. I will discontinue this medication. Increase Toprol to 50 mg daily. Add lisinopril 20 mg daily. Check potassium and renal function in 1 week. She will continue to follow her blood pressure at home and we will increase her medications if needed.

## 2014-08-14 NOTE — Assessment & Plan Note (Signed)
As outlined under hypertension we will plan to discontinue amlodipine.hopefully her edema will improved with these measures.

## 2014-08-17 DIAGNOSIS — E039 Hypothyroidism, unspecified: Secondary | ICD-10-CM | POA: Diagnosis not present

## 2014-08-17 DIAGNOSIS — M19011 Primary osteoarthritis, right shoulder: Secondary | ICD-10-CM | POA: Diagnosis not present

## 2014-08-17 DIAGNOSIS — E559 Vitamin D deficiency, unspecified: Secondary | ICD-10-CM | POA: Diagnosis not present

## 2014-08-17 DIAGNOSIS — I10 Essential (primary) hypertension: Secondary | ICD-10-CM | POA: Diagnosis not present

## 2014-08-21 ENCOUNTER — Other Ambulatory Visit: Payer: Self-pay | Admitting: Orthopedic Surgery

## 2014-08-21 DIAGNOSIS — I10 Essential (primary) hypertension: Secondary | ICD-10-CM | POA: Diagnosis not present

## 2014-08-22 LAB — BASIC METABOLIC PANEL WITH GFR
BUN: 24 mg/dL — ABNORMAL HIGH (ref 6–23)
CALCIUM: 9.5 mg/dL (ref 8.4–10.5)
CHLORIDE: 105 meq/L (ref 96–112)
CO2: 28 mEq/L (ref 19–32)
Creat: 0.91 mg/dL (ref 0.50–1.10)
GFR, Est African American: 66 mL/min
GFR, Est Non African American: 57 mL/min — ABNORMAL LOW
Glucose, Bld: 91 mg/dL (ref 70–99)
Potassium: 3.8 mEq/L (ref 3.5–5.3)
SODIUM: 143 meq/L (ref 135–145)

## 2014-09-05 ENCOUNTER — Encounter (HOSPITAL_COMMUNITY): Payer: Self-pay | Admitting: Pharmacy Technician

## 2014-09-07 ENCOUNTER — Encounter (HOSPITAL_COMMUNITY): Payer: Self-pay

## 2014-09-07 ENCOUNTER — Encounter (HOSPITAL_COMMUNITY)
Admission: RE | Admit: 2014-09-07 | Discharge: 2014-09-07 | Disposition: A | Discharge status: Home / Self Care | Payer: Medicare Other | Source: Ambulatory Visit | Attending: Orthopedic Surgery | Admitting: Orthopedic Surgery

## 2014-09-07 DIAGNOSIS — I10 Essential (primary) hypertension: Secondary | ICD-10-CM | POA: Insufficient documentation

## 2014-09-07 DIAGNOSIS — Z01812 Encounter for preprocedural laboratory examination: Secondary | ICD-10-CM | POA: Insufficient documentation

## 2014-09-07 DIAGNOSIS — Z79899 Other long term (current) drug therapy: Secondary | ICD-10-CM | POA: Insufficient documentation

## 2014-09-07 DIAGNOSIS — M19011 Primary osteoarthritis, right shoulder: Secondary | ICD-10-CM | POA: Insufficient documentation

## 2014-09-07 HISTORY — DX: Unspecified macular degeneration: H35.30

## 2014-09-07 HISTORY — DX: Reserved for inherently not codable concepts without codable children: IMO0001

## 2014-09-07 LAB — CBC
HEMATOCRIT: 39.6 % (ref 36.0–46.0)
Hemoglobin: 13 g/dL (ref 12.0–15.0)
MCH: 30.4 pg (ref 26.0–34.0)
MCHC: 32.8 g/dL (ref 30.0–36.0)
MCV: 92.5 fL (ref 78.0–100.0)
PLATELETS: 201 10*3/uL (ref 150–400)
RBC: 4.28 MIL/uL (ref 3.87–5.11)
RDW: 12.8 % (ref 11.5–15.5)
WBC: 7 10*3/uL (ref 4.0–10.5)

## 2014-09-07 LAB — BASIC METABOLIC PANEL
Anion gap: 7 (ref 5–15)
BUN: 24 mg/dL — AB (ref 6–23)
CHLORIDE: 103 mmol/L (ref 96–112)
CO2: 30 mmol/L (ref 19–32)
Calcium: 10.2 mg/dL (ref 8.4–10.5)
Creatinine, Ser: 0.82 mg/dL (ref 0.50–1.10)
GFR calc Af Amer: 72 mL/min — ABNORMAL LOW (ref >90)
GFR calc non Af Amer: 63 mL/min — ABNORMAL LOW (ref >90)
Glucose, Bld: 106 mg/dL — ABNORMAL HIGH (ref 70–99)
POTASSIUM: 3.6 mmol/L (ref 3.5–5.1)
Sodium: 140 mmol/L (ref 135–145)

## 2014-09-07 LAB — SURGICAL PCR SCREEN
MRSA, PCR: POSITIVE — AB
STAPHYLOCOCCUS AUREUS: POSITIVE — AB

## 2014-09-07 NOTE — Pre-Procedure Instructions (Signed)
Carla Taylor  09/07/2014   Your procedure is scheduled on:  Tuesday, March 1  Report to [facility] Main Entrance "A" at 5:30 AM.  Call this number if you have problems the morning of surgery: [phone]               Any questions prior to surgery call preadmission [phone] (monday-Friday 8:00am- 4:00pm)   Remember:   Do not eat food or drink liquids after midnight.   Take these medicines the morning of surgery with A SIP OF WATER: tylenol if needed, doxazosin (cardura), levothyroxine (synthroid), metoprolol succinate (toprol), systane eye drops               Do not wear jewelry, make-up or nail polish.  Do not wear lotions, powders, or perfumes. You may wear deodorant.  Do not shave 48 hours prior to surgery. Men may shave face and neck.  Do not bring valuables to the hospital.  [facility] is not responsible  for any belongings or valuables.               Contacts, dentures or bridgework may not be worn into surgery.  Leave suitcase in the car. After surgery it may be brought to your room.  For patients admitted to the hospital, discharge time is determined by your   treatment team.               Patients discharged the day of surgery will not be allowed to drive  home.  Name and phone number of your driver:   Special Instructions: review preparing for surgery handout   Please read over the following fact sheets that you were given: Pain Booklet, Coughing and Deep Breathing, MRSA Information and Surgical Site Infection Prevention

## 2014-09-07 NOTE — Progress Notes (Signed)
Mupirocin ointment called in to [facility].

## 2014-09-08 ENCOUNTER — Other Ambulatory Visit: Payer: Self-pay | Admitting: Orthopedic Surgery

## 2014-09-08 MED ORDER — VANCOMYCIN HCL 10 G IV SOLR: 1000.0000 mg | Freq: Once | INTRAVENOUS | Status: AC

## 2014-09-12 NOTE — Progress Notes (Signed)
HPI: FU hypertension and edema. Previously seen for preoperative evaluation. Echocardiogram November 2015 showed vigorous LV function, grade 1 diastolic dysfunction, mild mitral regurgitation and mild tricuspid regurgitation. Mildly elevated pulmonary pressures. Nuclear study November 2015 showed an ejection fraction of 71% and normal perfusion. Since I last saw her, she denies dyspnea, chest pain, palpitations or syncope. Her pedal edema has improved off of her amlodipine.  Current Outpatient Prescriptions  Medication Sig Dispense Refill  . acetaminophen (TYLENOL) 325 MG tablet Take 2 tablets (650 mg total) by mouth every 6 (six) hours as needed for moderate pain (shoulder pain). 240 tablet 3  . aspirin EC 81 MG tablet Take 81 mg by mouth daily.    . calcium carbonate (OS-CAL - DOSED IN MG OF ELEMENTAL CALCIUM) 1250 MG tablet Take 1 tablet by mouth 2 (two) times daily with a meal.    . doxazosin (CARDURA) 1 MG tablet Take 1 mg by mouth daily.    . hydrochlorothiazide (HYDRODIURIL) 25 MG tablet Take 25 mg by mouth daily.     Marland Kitchen levothyroxine (SYNTHROID, LEVOTHROID) 88 MCG tablet Take 88 mcg by mouth daily before breakfast.    . lisinopril (PRINIVIL,ZESTRIL) 20 MG tablet Take 1 tablet (20 mg total) by mouth daily. 90 tablet 3  . Magnesium Hydroxide (MILK OF MAGNESIA PO) Take 1 tablet by mouth as needed.    . metoprolol succinate (TOPROL-XL) 50 MG 24 hr tablet Take 1 tablet (50 mg total) by mouth daily. 90 tablet 3  . Multiple Vitamins-Minerals (ICAPS AREDS FORMULA PO) Take 1 tablet by mouth 2 (two) times daily.    Vladimir Faster Glycol-Propyl Glycol (SYSTANE) 0.4-0.3 % SOLN Place 1 drop into both eyes as needed (for dry eyes).     . vitamin B-12 (CYANOCOBALAMIN) 500 MCG tablet Take 500 mcg by mouth daily.    . Vitamin D, Ergocalciferol, (DRISDOL) 50000 UNITS CAPS capsule Take 50,000 Units by mouth every Sunday.     No current facility-administered medications for this visit.    Facility-Administered Medications Ordered in Other Visits  Medication Dose Route Frequency Provider Last Rate Last Dose  . vancomycin (VANCOCIN) 1,000 mg in sodium chloride 0.9 % 500 mL IVPB  1,000 mg Intravenous Once Joshua P Landau, MD         Past Medical History  Diagnosis Date  . Arthritis   . Hypertension   . Chronic shoulder pain   . Hyperlipidemia   . Hypothyroid   . Pancreatitis   . Shortness of breath dyspnea     on exertion  . Macular degeneration     Past Surgical History  Procedure Laterality Date  . No previous surgeries    . Carpal tunnel Bilateral   . Cataract extraction Bilateral     2005  . Knee arthroscopy Bilateral     20 01    History   Social History  . Marital Status: Widowed    Spouse Name: N/A  . Number of Children: 4  . Years of Education: N/A   Occupational History  .  [employer]   Social History Main Topics  . Smoking status: Never Smoker   . Smokeless tobacco: Not on file  . Alcohol Use: No  . Drug Use: No  . Sexual Activity: No   Other Topics Concern  . Not on file   Social History Narrative    ROS: shoulder pain but no fevers or chills, productive cough, hemoptysis, dysphasia, odynophagia, melena, hematochezia, dysuria,  hematuria, rash, seizure activity, orthopnea, PND, pedal edema, claudication. Remaining systems are negative.  Physical Exam: Well-developed well-nourished in no acute distress.  Skin is warm and dry.  HEENT is normal.  Neck is supple.  Chest is clear to auscultation with normal expansion.  Cardiovascular exam is regular rate and rhythm.  Abdominal exam nontender or distended. No masses palpated. Extremities show trace edema. neuro grossly intact   Electrocardiogram shows sinus rhythm with no ST changes.

## 2014-09-13 ENCOUNTER — Telehealth: Payer: Self-pay | Admitting: Cardiology

## 2014-09-13 ENCOUNTER — Encounter: Payer: Self-pay | Admitting: Cardiology

## 2014-09-13 ENCOUNTER — Ambulatory Visit: Payer: Medicare Other | Admitting: Cardiology

## 2014-09-13 ENCOUNTER — Ambulatory Visit (INDEPENDENT_AMBULATORY_CARE_PROVIDER_SITE_OTHER): Payer: Medicare Other | Admitting: Cardiology

## 2014-09-13 DIAGNOSIS — Z01810 Encounter for preprocedural cardiovascular examination: Secondary | ICD-10-CM | POA: Diagnosis not present

## 2014-09-13 DIAGNOSIS — R609 Edema, unspecified: Secondary | ICD-10-CM

## 2014-09-13 DIAGNOSIS — I10 Essential (primary) hypertension: Secondary | ICD-10-CM

## 2014-09-13 MED ORDER — METOPROLOL SUCCINATE ER 50 MG PO TB24: ORAL_TABLET | ORAL | Status: DC

## 2014-09-13 MED ORDER — LISINOPRIL 20 MG PO TABS: 20.0000 mg | ORAL_TABLET | Freq: Every day | ORAL | Status: DC

## 2014-09-13 NOTE — Telephone Encounter (Signed)
Spoke with pt, confirmed medications and changes made today

## 2014-09-13 NOTE — Assessment & Plan Note (Signed)
Improved.continue off of amlodipine.

## 2014-09-13 NOTE — Assessment & Plan Note (Signed)
Given recent negative nuclear study no further workup preoperatively.

## 2014-09-13 NOTE — Assessment & Plan Note (Signed)
Blood pressure elevated. Increase lisinopril to 40 mg daily. Check potassium and renal function on Monday. Increase Toprol to 75 mg daily. Follow blood pressure at home and we will increase medications as needed.

## 2014-09-13 NOTE — Patient Instructions (Signed)
Your physician recommends that you schedule a follow-up appointment in: 3 MONTHS WITH DR CRENSHAW  INCREASE LISINOPRIL TO 40 MG ONCE DAILY = 2 OF THE 20 MG TABLETS ONCE DAILY  Your physician recommends that you return for lab work in: [place] METOPROLOL TO 75 MG ONCE DAILY= 1 AND 1/2 OF THE 50 MG TABLETS ONCE DAILY

## 2014-09-18 DIAGNOSIS — Z01810 Encounter for preprocedural cardiovascular examination: Secondary | ICD-10-CM | POA: Diagnosis not present

## 2014-09-18 DIAGNOSIS — R609 Edema, unspecified: Secondary | ICD-10-CM | POA: Diagnosis not present

## 2014-09-18 DIAGNOSIS — Z961 Presence of intraocular lens: Secondary | ICD-10-CM | POA: Diagnosis not present

## 2014-09-18 DIAGNOSIS — H5203 Hypermetropia, bilateral: Secondary | ICD-10-CM | POA: Diagnosis not present

## 2014-09-18 DIAGNOSIS — I10 Essential (primary) hypertension: Secondary | ICD-10-CM | POA: Diagnosis not present

## 2014-09-18 DIAGNOSIS — H3530 Unspecified macular degeneration: Secondary | ICD-10-CM | POA: Diagnosis not present

## 2014-09-18 MED ORDER — CEFAZOLIN SODIUM-DEXTROSE 2-3 GM-% IV SOLR
2.0000 g | INTRAVENOUS | Status: AC
  Administered 2014-09-19: 2 g via INTRAVENOUS
  Filled 2014-09-18: qty 50

## 2014-09-19 ENCOUNTER — Encounter (HOSPITAL_COMMUNITY)
Admission: RE | Disposition: A | Discharge status: Home / Self Care | Payer: Self-pay | Source: Ambulatory Visit | Attending: Orthopedic Surgery

## 2014-09-19 ENCOUNTER — Inpatient Hospital Stay (HOSPITAL_COMMUNITY)
Admission: RE | Admit: 2014-09-19 | Discharge: 2014-09-21 | DRG: 483 | Disposition: A | Discharge status: Home / Self Care | Payer: Medicare Other | Source: Ambulatory Visit | Attending: Orthopedic Surgery | Admitting: Orthopedic Surgery

## 2014-09-19 ENCOUNTER — Encounter (HOSPITAL_COMMUNITY): Payer: Self-pay | Admitting: Anesthesiology

## 2014-09-19 ENCOUNTER — Inpatient Hospital Stay (HOSPITAL_COMMUNITY): Payer: Medicare Other | Admitting: Anesthesiology

## 2014-09-19 ENCOUNTER — Inpatient Hospital Stay (HOSPITAL_COMMUNITY): Payer: Medicare Other

## 2014-09-19 DIAGNOSIS — H3530 Unspecified macular degeneration: Secondary | ICD-10-CM | POA: Diagnosis present

## 2014-09-19 DIAGNOSIS — I10 Essential (primary) hypertension: Secondary | ICD-10-CM | POA: Diagnosis present

## 2014-09-19 DIAGNOSIS — Z471 Aftercare following joint replacement surgery: Secondary | ICD-10-CM | POA: Diagnosis not present

## 2014-09-19 DIAGNOSIS — Z7982 Long term (current) use of aspirin: Secondary | ICD-10-CM

## 2014-09-19 DIAGNOSIS — M19011 Primary osteoarthritis, right shoulder: Principal | ICD-10-CM | POA: Diagnosis present

## 2014-09-19 DIAGNOSIS — G8918 Other acute postprocedural pain: Secondary | ICD-10-CM | POA: Diagnosis not present

## 2014-09-19 DIAGNOSIS — E039 Hypothyroidism, unspecified: Secondary | ICD-10-CM | POA: Diagnosis not present

## 2014-09-19 DIAGNOSIS — Z9841 Cataract extraction status, right eye: Secondary | ICD-10-CM

## 2014-09-19 DIAGNOSIS — Z96611 Presence of right artificial shoulder joint: Secondary | ICD-10-CM | POA: Diagnosis not present

## 2014-09-19 DIAGNOSIS — G8929 Other chronic pain: Secondary | ICD-10-CM | POA: Diagnosis present

## 2014-09-19 DIAGNOSIS — E785 Hyperlipidemia, unspecified: Secondary | ICD-10-CM | POA: Diagnosis not present

## 2014-09-19 DIAGNOSIS — Z9842 Cataract extraction status, left eye: Secondary | ICD-10-CM

## 2014-09-19 HISTORY — PX: TOTAL SHOULDER ARTHROPLASTY: SHX126

## 2014-09-19 HISTORY — DX: Primary osteoarthritis, right shoulder: M19.011

## 2014-09-19 LAB — BASIC METABOLIC PANEL WITH GFR
BUN: 32 mg/dL — ABNORMAL HIGH (ref 6–23)
CALCIUM: 9.6 mg/dL (ref 8.4–10.5)
CO2: 27 meq/L (ref 19–32)
CREATININE: 1.01 mg/dL (ref 0.50–1.10)
Chloride: 103 mEq/L (ref 96–112)
GFR, EST NON AFRICAN AMERICAN: 50 mL/min — AB
GFR, Est African American: 58 mL/min — ABNORMAL LOW
Glucose, Bld: 97 mg/dL (ref 70–99)
Potassium: 3.8 mEq/L (ref 3.5–5.3)
Sodium: 139 mEq/L (ref 135–145)

## 2014-09-19 SURGERY — ARTHROPLASTY, SHOULDER, TOTAL
Anesthesia: General | Site: Shoulder | Laterality: Right

## 2014-09-19 MED ORDER — METOCLOPRAMIDE HCL 5 MG/ML IJ SOLN: 5.0000 mg | Freq: Three times a day (TID) | INTRAMUSCULAR | Status: DC | PRN

## 2014-09-19 MED ORDER — ACETAMINOPHEN 325 MG PO TABS: 650.0000 mg | ORAL_TABLET | Freq: Four times a day (QID) | ORAL | Status: DC | PRN

## 2014-09-19 MED ORDER — POTASSIUM CHLORIDE IN NACL 20-0.45 MEQ/L-% IV SOLN
INTRAVENOUS | Status: DC
  Administered 2014-09-19: 17:00:00 via INTRAVENOUS
  Filled 2014-09-19 (×5): qty 1000

## 2014-09-19 MED ORDER — VANCOMYCIN HCL IN DEXTROSE 1-5 GM/200ML-% IV SOLN
1000.0000 mg | Freq: Two times a day (BID) | INTRAVENOUS | Status: AC
  Administered 2014-09-19: 1000 mg via INTRAVENOUS
  Filled 2014-09-19: qty 200

## 2014-09-19 MED ORDER — CEFAZOLIN SODIUM-DEXTROSE 2-3 GM-% IV SOLR
2.0000 g | Freq: Three times a day (TID) | INTRAVENOUS | Status: AC
  Administered 2014-09-19 (×2): 2 g via INTRAVENOUS
  Filled 2014-09-19 (×2): qty 50

## 2014-09-19 MED ORDER — DIPHENHYDRAMINE HCL 12.5 MG/5ML PO ELIX
12.5000 mg | ORAL_SOLUTION | ORAL | Status: DC | PRN
  Administered 2014-09-20: 25 mg via ORAL
  Filled 2014-09-19: qty 10

## 2014-09-19 MED ORDER — LACTATED RINGERS IV SOLN
INTRAVENOUS | Status: DC | PRN
  Administered 2014-09-19 (×2): via INTRAVENOUS

## 2014-09-19 MED ORDER — ARTIFICIAL TEARS OP OINT
TOPICAL_OINTMENT | OPHTHALMIC | Status: DC | PRN
  Administered 2014-09-19: 1 via OPHTHALMIC

## 2014-09-19 MED ORDER — METOCLOPRAMIDE HCL 5 MG PO TABS
5.0000 mg | ORAL_TABLET | Freq: Three times a day (TID) | ORAL | Status: DC | PRN
  Filled 2014-09-19: qty 2

## 2014-09-19 MED ORDER — BUPIVACAINE-EPINEPHRINE (PF) 0.5% -1:200000 IJ SOLN
INTRAMUSCULAR | Status: DC | PRN
  Administered 2014-09-19: 30 mL via PERINEURAL

## 2014-09-19 MED ORDER — LISINOPRIL 40 MG PO TABS
40.0000 mg | ORAL_TABLET | Freq: Every day | ORAL | Status: DC
  Administered 2014-09-19: 40 mg via ORAL
  Filled 2014-09-19 (×3): qty 1

## 2014-09-19 MED ORDER — SENNA 8.6 MG PO TABS
1.0000 | ORAL_TABLET | Freq: Two times a day (BID) | ORAL | Status: DC
  Administered 2014-09-19 – 2014-09-21 (×4): 8.6 mg via ORAL
  Filled 2014-09-19 (×4): qty 1

## 2014-09-19 MED ORDER — ALUM & MAG HYDROXIDE-SIMETH 200-200-20 MG/5ML PO SUSP
30.0000 mL | ORAL | Status: DC | PRN
Start: 2014-09-19 — End: 2014-09-21

## 2014-09-19 MED ORDER — ONDANSETRON HCL 4 MG PO TABS: 4.0000 mg | ORAL_TABLET | Freq: Four times a day (QID) | ORAL | Status: DC | PRN

## 2014-09-19 MED ORDER — POLYVINYL ALCOHOL 1.4 % OP SOLN
1.0000 [drp] | OPHTHALMIC | Status: DC | PRN
  Filled 2014-09-19: qty 15

## 2014-09-19 MED ORDER — FENTANYL CITRATE 0.05 MG/ML IJ SOLN: 25.0000 ug | INTRAMUSCULAR | Status: DC | PRN

## 2014-09-19 MED ORDER — DEXTROSE 5 % IV SOLN
500.0000 mg | Freq: Four times a day (QID) | INTRAVENOUS | Status: DC | PRN
  Filled 2014-09-19: qty 5

## 2014-09-19 MED ORDER — GLYCOPYRROLATE 0.2 MG/ML IJ SOLN
INTRAMUSCULAR | Status: DC | PRN
  Administered 2014-09-19: 0.4 mg via INTRAVENOUS

## 2014-09-19 MED ORDER — METHOCARBAMOL 500 MG PO TABS
500.0000 mg | ORAL_TABLET | Freq: Four times a day (QID) | ORAL | Status: DC | PRN
  Administered 2014-09-19 – 2014-09-21 (×5): 500 mg via ORAL
  Filled 2014-09-19 (×5): qty 1

## 2014-09-19 MED ORDER — LEVOTHYROXINE SODIUM 88 MCG PO TABS
88.0000 ug | ORAL_TABLET | Freq: Every day | ORAL | Status: DC
  Administered 2014-09-20 – 2014-09-21 (×2): 88 ug via ORAL
  Filled 2014-09-19 (×2): qty 1

## 2014-09-19 MED ORDER — PROPOFOL 10 MG/ML IV BOLUS
INTRAVENOUS | Status: AC
  Filled 2014-09-19: qty 20

## 2014-09-19 MED ORDER — DOXAZOSIN MESYLATE 1 MG PO TABS
1.0000 mg | ORAL_TABLET | Freq: Every day | ORAL | Status: DC
  Filled 2014-09-19 (×2): qty 1

## 2014-09-19 MED ORDER — BISACODYL 10 MG RE SUPP: 10.0000 mg | Freq: Every day | RECTAL | Status: DC | PRN

## 2014-09-19 MED ORDER — MENTHOL 3 MG MT LOZG: 1.0000 | LOZENGE | OROMUCOSAL | Status: DC | PRN

## 2014-09-19 MED ORDER — VANCOMYCIN HCL IN DEXTROSE 1-5 GM/200ML-% IV SOLN
INTRAVENOUS | Status: AC
  Administered 2014-09-19: 1000 mg via INTRAVENOUS
  Filled 2014-09-19: qty 200

## 2014-09-19 MED ORDER — METOPROLOL SUCCINATE ER 50 MG PO TB24
50.0000 mg | ORAL_TABLET | Freq: Every day | ORAL | Status: DC
  Administered 2014-09-20 – 2014-09-21 (×2): 50 mg via ORAL
  Filled 2014-09-19 (×2): qty 1

## 2014-09-19 MED ORDER — ONDANSETRON HCL 4 MG/2ML IJ SOLN
INTRAMUSCULAR | Status: DC | PRN
  Administered 2014-09-19: 4 mg via INTRAVENOUS

## 2014-09-19 MED ORDER — FENTANYL CITRATE 0.05 MG/ML IJ SOLN
INTRAMUSCULAR | Status: AC
  Filled 2014-09-19: qty 5

## 2014-09-19 MED ORDER — HYDROCODONE-ACETAMINOPHEN 7.5-325 MG PO TABS
1.0000 | ORAL_TABLET | ORAL | Status: DC | PRN
  Administered 2014-09-19: 2 via ORAL
  Administered 2014-09-19: 1 via ORAL
  Administered 2014-09-19 – 2014-09-21 (×8): 2 via ORAL
  Filled 2014-09-19 (×4): qty 2
  Filled 2014-09-19: qty 1
  Filled 2014-09-19 (×5): qty 2

## 2014-09-19 MED ORDER — SUCCINYLCHOLINE CHLORIDE 20 MG/ML IJ SOLN
INTRAMUSCULAR | Status: DC | PRN
  Administered 2014-09-19: 100 mg via INTRAVENOUS

## 2014-09-19 MED ORDER — PHENYLEPHRINE HCL 10 MG/ML IJ SOLN
10.0000 mg | INTRAMUSCULAR | Status: DC | PRN
  Administered 2014-09-19: 15 ug/min via INTRAVENOUS

## 2014-09-19 MED ORDER — MAGNESIUM HYDROXIDE 400 MG/5ML PO SUSP: 30.0000 mL | ORAL | Status: DC | PRN

## 2014-09-19 MED ORDER — ACETAMINOPHEN 650 MG RE SUPP: 650.0000 mg | Freq: Four times a day (QID) | RECTAL | Status: DC | PRN

## 2014-09-19 MED ORDER — ASPIRIN EC 81 MG PO TBEC
81.0000 mg | DELAYED_RELEASE_TABLET | Freq: Every day | ORAL | Status: DC
  Administered 2014-09-19 – 2014-09-21 (×3): 81 mg via ORAL
  Filled 2014-09-19 (×3): qty 1

## 2014-09-19 MED ORDER — DOCUSATE SODIUM 100 MG PO CAPS
100.0000 mg | ORAL_CAPSULE | Freq: Two times a day (BID) | ORAL | Status: DC
  Administered 2014-09-19 – 2014-09-21 (×4): 100 mg via ORAL
  Filled 2014-09-19 (×5): qty 1

## 2014-09-19 MED ORDER — PROPOFOL 10 MG/ML IV BOLUS
INTRAVENOUS | Status: DC | PRN
  Administered 2014-09-19: 100 mg via INTRAVENOUS

## 2014-09-19 MED ORDER — HYDROCODONE-ACETAMINOPHEN 5-325 MG PO TABS: 1.0000 | ORAL_TABLET | Freq: Four times a day (QID) | ORAL | Status: DC | PRN

## 2014-09-19 MED ORDER — ONDANSETRON HCL 4 MG/2ML IJ SOLN: 4.0000 mg | Freq: Four times a day (QID) | INTRAMUSCULAR | Status: DC | PRN

## 2014-09-19 MED ORDER — PHENOL 1.4 % MT LIQD: 1.0000 | OROMUCOSAL | Status: DC | PRN

## 2014-09-19 MED ORDER — VITAMIN D (ERGOCALCIFEROL) 1.25 MG (50000 UNIT) PO CAPS: 50000.0000 [IU] | ORAL_CAPSULE | ORAL | Status: DC

## 2014-09-19 MED ORDER — MORPHINE SULFATE 2 MG/ML IJ SOLN
1.0000 mg | INTRAMUSCULAR | Status: DC | PRN
  Administered 2014-09-19: 1 mg via INTRAVENOUS
  Filled 2014-09-19: qty 1

## 2014-09-19 MED ORDER — SENNA-DOCUSATE SODIUM 8.6-50 MG PO TABS: 2.0000 | ORAL_TABLET | Freq: Every day | ORAL | Status: DC

## 2014-09-19 MED ORDER — CALCIUM CARBONATE 1250 (500 CA) MG PO TABS
1.0000 | ORAL_TABLET | Freq: Two times a day (BID) | ORAL | Status: DC
  Administered 2014-09-19 – 2014-09-21 (×4): 500 mg via ORAL
  Filled 2014-09-19 (×4): qty 1

## 2014-09-19 MED ORDER — BACLOFEN 10 MG PO TABS: 10.0000 mg | ORAL_TABLET | Freq: Three times a day (TID) | ORAL | Status: DC

## 2014-09-19 MED ORDER — POLYETHYLENE GLYCOL 3350 17 G PO PACK: 17.0000 g | PACK | Freq: Every day | ORAL | Status: DC | PRN

## 2014-09-19 MED ORDER — FENTANYL CITRATE 0.05 MG/ML IJ SOLN
INTRAMUSCULAR | Status: DC | PRN
  Administered 2014-09-19: 50 ug via INTRAVENOUS

## 2014-09-19 MED ORDER — HYDROCHLOROTHIAZIDE 25 MG PO TABS
25.0000 mg | ORAL_TABLET | Freq: Every day | ORAL | Status: DC
  Administered 2014-09-19 – 2014-09-21 (×2): 25 mg via ORAL
  Filled 2014-09-19 (×2): qty 1

## 2014-09-19 SURGICAL SUPPLY — 69 items
APL SKNCLS STERI-STRIP NONHPOA (GAUZE/BANDAGES/DRESSINGS) ×1
BENZOIN TINCTURE PRP APPL 2/3 (GAUZE/BANDAGES/DRESSINGS) ×3 IMPLANT
BIT DRILL 5/64X5 DISP (BIT) ×3 IMPLANT
BLADE SAW SGTL MED 73X18.5 STR (BLADE) ×3 IMPLANT
BOWL SMART MIX CTS (DISPOSABLE) IMPLANT
BRUSH FEMORAL CANAL (MISCELLANEOUS) IMPLANT
CAPT SHLDR TOTAL 2 ×3 IMPLANT
CEMENT HV SMART SET (Cement) ×2 IMPLANT
CLOSURE STERI-STRIP 1/2X4 (GAUZE/BANDAGES/DRESSINGS) ×1
CLSR STERI-STRIP ANTIMIC 1/2X4 (GAUZE/BANDAGES/DRESSINGS) ×2 IMPLANT
COVER SURGICAL LIGHT HANDLE (MISCELLANEOUS) ×3 IMPLANT
COVER TABLE BACK 60X90 (DRAPES) IMPLANT
DRAPE C-ARM 42X72 X-RAY (DRAPES) IMPLANT
DRAPE IMP U-DRAPE 54X76 (DRAPES) ×3 IMPLANT
DRAPE U-SHAPE 47X51 STRL (DRAPES) ×3 IMPLANT
DRSG MEPILEX BORDER 4X8 (GAUZE/BANDAGES/DRESSINGS) ×2 IMPLANT
DURAPREP 26ML APPLICATOR (WOUND CARE) ×3 IMPLANT
ELECT REM PT RETURN 9FT ADLT (ELECTROSURGICAL) ×3
ELECTRODE REM PT RTRN 9FT ADLT (ELECTROSURGICAL) ×1 IMPLANT
EVACUATOR 1/8 PVC DRAIN (DRAIN) IMPLANT
FACESHIELD WRAPAROUND (MASK) ×3 IMPLANT
FACESHIELD WRAPAROUND OR TEAM (MASK) IMPLANT
GAUZE SPONGE 4X4 12PLY STRL (GAUZE/BANDAGES/DRESSINGS) ×3 IMPLANT
GLOVE BIOGEL PI IND STRL 8 (GLOVE) ×1 IMPLANT
GLOVE BIOGEL PI INDICATOR 8 (GLOVE) ×2
GLOVE BIOGEL PI ORTHO PRO SZ8 (GLOVE) ×2
GLOVE ORTHO TXT STRL SZ7.5 (GLOVE) ×5 IMPLANT
GLOVE PI ORTHO PRO STRL SZ8 (GLOVE) ×1 IMPLANT
GLOVE SURG ORTHO 8.0 STRL STRW (GLOVE) ×6 IMPLANT
GOWN STRL REUS W/ TWL LRG LVL3 (GOWN DISPOSABLE) ×1 IMPLANT
GOWN STRL REUS W/ TWL XL LVL3 (GOWN DISPOSABLE) ×1 IMPLANT
GOWN STRL REUS W/TWL 2XL LVL3 (GOWN DISPOSABLE) ×3 IMPLANT
GOWN STRL REUS W/TWL LRG LVL3 (GOWN DISPOSABLE) ×3
GOWN STRL REUS W/TWL XL LVL3 (GOWN DISPOSABLE) ×3
HANDPIECE INTERPULSE COAX TIP (DISPOSABLE) ×3
HOOD PEEL AWAY FACE SHEILD DIS (HOOD) ×6 IMPLANT
KIT BASIN OR (CUSTOM PROCEDURE TRAY) ×3 IMPLANT
KIT ROOM TURNOVER OR (KITS) ×3 IMPLANT
MANIFOLD NEPTUNE II (INSTRUMENTS) ×3 IMPLANT
NDL 1/2 CIR CATGUT .05X1.09 (NEEDLE) IMPLANT
NDL HYPO 25GX1X1/2 BEV (NEEDLE) IMPLANT
NEEDLE 1/2 CIR CATGUT .05X1.09 (NEEDLE) IMPLANT
NEEDLE HYPO 25GX1X1/2 BEV (NEEDLE) IMPLANT
NS IRRIG 1000ML POUR BTL (IV SOLUTION) ×3 IMPLANT
PACK SHOULDER (CUSTOM PROCEDURE TRAY) ×3 IMPLANT
PACK UNIVERSAL I (CUSTOM PROCEDURE TRAY) ×3 IMPLANT
PAD ARMBOARD 7.5X6 YLW CONV (MISCELLANEOUS) ×6 IMPLANT
PIN HUMERAL STMN 3.2MMX9IN (INSTRUMENTS) IMPLANT
SET HNDPC FAN SPRY TIP SCT (DISPOSABLE) ×1 IMPLANT
SLING ARM IMMOBILIZER LRG (SOFTGOODS) IMPLANT
SLING ARM IMMOBILIZER MED (SOFTGOODS) IMPLANT
SMARTMIX MINI TOWER (MISCELLANEOUS) ×3
SPONGE LAP 18X18 X RAY DECT (DISPOSABLE) ×3 IMPLANT
SUCTION FRAZIER TIP 10 FR DISP (SUCTIONS) ×3 IMPLANT
SUPPORT WRAP ARM LG (MISCELLANEOUS) ×3 IMPLANT
SUT FIBERWIRE #2 38 REV NDL BL (SUTURE) ×9
SUT MAXBRAID (SUTURE) IMPLANT
SUT MNCRL AB 4-0 PS2 18 (SUTURE) IMPLANT
SUT VIC AB 0 CT1 27 (SUTURE) ×3
SUT VIC AB 0 CT1 27XBRD ANBCTR (SUTURE) ×1 IMPLANT
SUT VIC AB 2-0 CT1 27 (SUTURE)
SUT VIC AB 2-0 CT1 TAPERPNT 27 (SUTURE) IMPLANT
SUT VIC AB 3-0 SH 8-18 (SUTURE) ×3 IMPLANT
SUTURE FIBERWR#2 38 REV NDL BL (SUTURE) IMPLANT
SYR CONTROL 10ML LL (SYRINGE) IMPLANT
TOWEL OR 17X24 6PK STRL BLUE (TOWEL DISPOSABLE) ×3 IMPLANT
TOWEL OR 17X26 10 PK STRL BLUE (TOWEL DISPOSABLE) ×3 IMPLANT
TOWER SMARTMIX MINI (MISCELLANEOUS) IMPLANT
WATER STERILE IRR 1000ML POUR (IV SOLUTION) ×3 IMPLANT

## 2014-09-19 NOTE — Anesthesia Procedure Notes (Addendum)
Anesthesia Regional Block:  Interscalene brachial plexus block  Pre-Anesthetic Checklist: ,, timeout performed, Correct Patient, Correct Site, Correct Laterality, Correct Procedure, Correct Position, site marked, Risks and benefits discussed,  Surgical consent,  Pre-op evaluation,  At surgeon's request and post-op pain management  Laterality: Right and Upper  Prep: chloraprep       Needles:  Injection technique: Single-shot  Needle Type: Stimulator Needle - 40     Needle Length: 4cm 4 cm Needle Gauge: 22 and 22 G    Additional Needles:  Procedures: nerve stimulator Interscalene brachial plexus block  Nerve Stimulator or Paresthesia:  Response: forearm twitch, 0.4 mA, 0.1 ms,   Additional Responses:   Narrative:  Start time: 09/19/2014 7:12 AM End time: 09/19/2014 7:18 AM Injection made incrementally with aspirations every 5 mL.  Performed by: Personally  Anesthesiologist: [name]  Additional Notes: Pt identified in Holding room.  Monitors applied. Working IV access confirmed. Sterile prep R neck.  #22ga PNS to forearm twitch at 0.35mA threshold.  30cc 0.5% Bupivacaine with 1:200k epi injected incrementally after negative test dose.  Patient asymptomatic, VSS, no heme aspirated, tolerated well.  [name], MD   Procedure Name: Intubation Date/Time: 09/19/2014 7:41 AM Performed by: [name] Pre-anesthesia Checklist: Patient being monitored, Suction available, Emergency Drugs available, Patient identified and Timeout performed Patient Re-evaluated:Patient Re-evaluated prior to inductionOxygen Delivery Method: Circle system utilized Preoxygenation: Pre-oxygenation with 100% oxygen Intubation Type: IV induction Ventilation: Mask ventilation without difficulty Laryngoscope Size: Mac and 3 Grade View: Grade I Tube type: Oral Tube size: 7.0 mm Number of attempts: 1 Placement Confirmation: positive ETCO2,  ETT inserted through vocal cords under direct  vision and breath sounds checked- equal and bilateral Secured at: 20 cm Tube secured with: Tape Dental Injury: Teeth and Oropharynx as per pre-operative assessment

## 2014-09-19 NOTE — Progress Notes (Signed)
Utilization review completed.  

## 2014-09-19 NOTE — Anesthesia Postprocedure Evaluation (Signed)
  Anesthesia Post-op Note  Patient: Carla Taylor  Procedure(s) Performed: Procedure(s): RIGHT TOTAL SHOULDER ARTHROPLASTY (Right)  Patient Location: PACU  Anesthesia Type:GA combined with regional for post-op pain  Level of Consciousness: awake, alert , oriented and patient cooperative  Airway and Oxygen Therapy: Patient Spontanous Breathing and Patient connected to nasal cannula oxygen  Post-op Pain: none  Post-op Assessment: Post-op Vital signs reviewed, Patient's Cardiovascular Status Stable, Respiratory Function Stable, Patent Airway, No signs of Nausea or vomiting and Pain level controlled  Post-op Vital Signs: Reviewed and stable  Last Vitals:  Filed Vitals:   09/19/14 1038  BP:   Pulse: 54  Temp:   Resp: 17    Complications: No apparent anesthesia complications

## 2014-09-19 NOTE — Anesthesia Preprocedure Evaluation (Addendum)
Anesthesia Evaluation  Patient identified by MRN, date of birth, ID band Patient awake    Reviewed: Allergy & Precautions, NPO status , Patient's Chart, lab work & pertinent test results, reviewed documented beta blocker date and time   History of Anesthesia Complications Negative for: history of anesthetic complications  Airway Mallampati: I  TM Distance: >3 FB Neck ROM: full    Dental  (+) Edentulous Upper, Dental Advidsory Given   Pulmonary shortness of breath,  breath sounds clear to auscultation        Cardiovascular hypertension, Pt. on medications and Pt. on home beta blockers - anginaRhythm:regular Rate:Normal     Neuro/Psych negative neurological ROS     GI/Hepatic negative GI ROS, Neg liver ROS,   Endo/Other  Hypothyroidism Morbid obesity  Renal/GU negative Renal ROS     Musculoskeletal  (+) Arthritis -, Osteoarthritis,    Abdominal (+) + obese,   Peds  Hematology   Anesthesia Other Findings   Reproductive/Obstetrics                            Anesthesia Physical Anesthesia Plan  ASA: III  Anesthesia Plan: General   Post-op Pain Management:    Induction: Intravenous  Airway Management Planned: Oral ETT  Additional Equipment:   Intra-op Plan:   Post-operative Plan: Extubation in OR  Informed Consent: I have reviewed the patients History and Physical, chart, labs and discussed the procedure including the risks, benefits and alternatives for the proposed anesthesia with the patient or authorized representative who has indicated his/her understanding and acceptance.   Dental Advisory Given  Plan Discussed with: Anesthesiologist, CRNA and Surgeon  Anesthesia Plan Comments: (Plan routine monitors, GETA with interscalene block for post op analgesia)       Anesthesia Quick Evaluation

## 2014-09-19 NOTE — Discharge Instructions (Signed)
Diet: As you were doing prior to hospitalization   Shower:  May shower but keep the wounds dry, use an occlusive plastic wrap, NO SOAKING IN TUB.  If the bandage gets wet, change with a clean dry gauze.  Dressing:  You may change your dressing 3-5 days after surgery.  Then change the dressing daily with sterile gauze dressing.    There are sticky tapes (steri-strips) on your wounds and all the stitches are absorbable.  Leave the steri-strips in place when changing your dressings, they will peel off with time, usually 2-3 weeks.  Activity:  Increase activity slowly as tolerated, but follow the weight bearing instructions below.  No lifting or driving for 6 weeks.  Weight Bearing:   Sling at all times..    To prevent constipation: you may use a stool softener such as -  Colace (over the counter) 100 mg by mouth twice a day  Drink plenty of fluids (prune juice may be helpful) and high fiber foods Miralax (over the counter) for constipation as needed.    Itching:  If you experience itching with your medications, try taking only a single pain pill, or even half a pain pill at a time.  You may take up to 10 pain pills per day, and you can also use benadryl over the counter for itching or also to help with sleep.   Precautions:  If you experience chest pain or shortness of breath - call 911 immediately for transfer to the hospital emergency department!!  If you develop a fever greater that 101 F, purulent drainage from wound, increased redness or drainage from wound, or calf pain -- Call the office at [phone]                                                Follow- Up Appointment:  Please call for an appointment to be seen in 2 weeks [place] - [phone]

## 2014-09-19 NOTE — H&P (Signed)
PREOPERATIVE H&P  Chief Complaint: djd right shoulder  HPI: Carla Taylor is a 81 y.o. female who presents for preoperative history and physical with a diagnosis of djd right shoulder. Symptoms are rated as moderate to severe, and have been worsening.  This is significantly impairing activities of daily living.  She has elected for surgical management.   She has failed injections, activity modification, anti-inflammatories, and assistive devices.  Preoperative X-rays demonstrate end stage degenerative changes with osteophyte formation, loss of joint space, subchondral sclerosis.   Past Medical History  Diagnosis Date  . Arthritis   . Hypertension   . Chronic shoulder pain   . Hyperlipidemia   . Hypothyroid   . Pancreatitis   . Shortness of breath dyspnea     on exertion  . Macular degeneration    Past Surgical History  Procedure Laterality Date  . No previous surgeries    . Carpal tunnel Bilateral   . Cataract extraction Bilateral     2005  . Knee arthroscopy Bilateral     2001   History   Social History  . Marital Status: Widowed    Spouse Name: N/A  . Number of Children: 4  . Years of Education: N/A   Occupational History  .  [employer]   Social History Main Topics  . Smoking status: Never Smoker   . Smokeless tobacco: Not on file  . Alcohol Use: No  . Drug Use: No  . Sexual Activity: No   Other Topics Concern  . None   Social History Narrative   Family History  Problem Relation Age of Onset  . CVA Mother   . CVA Father    No Known Allergies Prior to Admission medications   Medication Sig Start Date End Date Taking? Authorizing Provider  acetaminophen (TYLENOL) 325 MG tablet Take 2 tablets (650 mg total) by mouth every 6 (six) hours as needed for moderate pain (shoulder pain). 06/26/14  Yes Lelon Perla, MD  aspirin EC 81 MG tablet Take 81 mg by mouth daily.   Yes Historical Provider, MD  calcium carbonate (OS-CAL - DOSED IN MG OF  ELEMENTAL CALCIUM) 1250 MG tablet Take 1 tablet by mouth 2 (two) times daily with a meal.   Yes Historical Provider, MD  doxazosin (CARDURA) 1 MG tablet Take 1 mg by mouth daily.   Yes Historical Provider, MD  hydrochlorothiazide (HYDRODIURIL) 25 MG tablet Take 25 mg by mouth daily.  04/29/14  Yes Historical Provider, MD  levothyroxine (SYNTHROID, LEVOTHROID) 88 MCG tablet Take 88 mcg by mouth daily before breakfast.   Yes Historical Provider, MD  lisinopril (PRINIVIL,ZESTRIL) 20 MG tablet Take 1 tablet (20 mg total) by mouth daily. Patient taking differently: Take 40 mg by mouth daily.  09/13/14  Yes Lelon Perla, MD  Magnesium Hydroxide (MILK OF MAGNESIA PO) Take 1 tablet by mouth as needed.   Yes Historical Provider, MD  metoprolol succinate (TOPROL-XL) 50 MG 24 hr tablet TAKE ONE AND ONE HALF TABLETS ONCE DAILY 09/13/14  Yes Lelon Perla, MD  Multiple Vitamins-Minerals (ICAPS AREDS FORMULA PO) Take 1 tablet by mouth 2 (two) times daily.   Yes Historical Provider, MD  Polyethyl Glycol-Propyl Glycol (SYSTANE) 0.4-0.3 % SOLN Place 1 drop into both eyes as needed (for dry eyes).    Yes Historical Provider, MD  vitamin B-12 (CYANOCOBALAMIN) 500 MCG tablet Take 500 mcg by mouth daily.   Yes Historical Provider, MD  Vitamin D, Ergocalciferol, (DRISDOL)  50000 UNITS CAPS capsule Take 50,000 Units by mouth every Sunday.   Yes Historical Provider, MD     Positive ROS: All other systems have been reviewed and were otherwise negative with the exception of those mentioned in the HPI and as above.  Physical Exam: General: Alert, no acute distress Cardiovascular: No pedal edema Respiratory: No cyanosis, no use of accessory musculature GI: No organomegaly, abdomen is soft and non-tender Skin: No lesions in the area of chief complaint Neurologic: Sensation intact distally Psychiatric: Patient is competent for consent with normal mood and affect Lymphatic: No axillary or cervical  lymphadenopathy  MUSCULOSKELETAL: right shoulder AROM 0-165, ER to 20, positive crepitance, limited IR.  Assessment: right shoulder glenohumeral osteoarthritis  Plan: Plan for Procedure(s): RIGHT TOTAL SHOULDER ARTHROPLASTY  The risks benefits and alternatives were discussed with the patient including but not limited to the risks of nonoperative treatment, versus surgical intervention including infection, bleeding, nerve injury,  blood clots, cardiopulmonary complications, morbidity, mortality, among others, and they were willing to proceed.   Johnny Bridge, MD Cell [phone]   09/19/2014 7:17 AM

## 2014-09-19 NOTE — Progress Notes (Signed)
Orthopedic Tech Progress Note Patient Details:  Carla Taylor [birth year] [id number]  Patient ID: Carla Taylor, female   DOB: June 11, 1925, 81 y.o.   MRN: [id number] Pt unable to use trapeze bar patient helper  Hildred Priest 09/19/2014, 1:57 PM

## 2014-09-19 NOTE — Transfer of Care (Signed)
Immediate Anesthesia Transfer of Care Note  Patient: Carla Taylor  Procedure(s) Performed: Procedure(s): RIGHT TOTAL SHOULDER ARTHROPLASTY (Right)  Patient Location: PACU  Anesthesia Type:General  Level of Consciousness: sedated  Airway & Oxygen Therapy: Patient Spontanous Breathing and Patient connected to nasal cannula oxygen  Post-op Assessment: Report given to RN, Post -op Vital signs reviewed and stable and Patient moving all extremities X 4  Post vital signs: Reviewed and stable  Last Vitals:  Filed Vitals:   09/19/14 0606  BP: 192/61  Pulse:   Temp:   Resp:     Complications: No apparent anesthesia complications

## 2014-09-19 NOTE — Progress Notes (Signed)
Called Dr.Jackson for sign out  

## 2014-09-19 NOTE — Evaluation (Addendum)
Occupational Therapy Evaluation Patient Details Name: Carla Taylor MRN: [id number] DOB: 1925-05-26 Today's Date: 09/19/2014    History of Present Illness 81 y.o. Rt TSA.   Clinical Impression   Pt s/p above. Pt independent with ADLs, PTA. Feel pt will benefit from acute OT to increase independent with BADLs and address RUE prior to d/c. Family/friends available to assist 24/7.    Follow Up Recommendations  No OT follow up;Supervision/Assistance - 24 hour    Equipment Recommendations  3 in 1 bedside comode (AE may be helfpul)    Recommendations for Other Services       Precautions / Restrictions Precautions Precautions: Fall;Shoulder Precaution Comments: no pushing, pulling, lifting with RUE (can use to hold light items); no shoulder movement  Required Braces or Orthoses: Sling (at all times, except bathing, dressing, exercise) Restrictions Weight Bearing Restrictions: Yes RUE Weight Bearing: Non weight bearing      Mobility Bed Mobility Overal bed mobility: Needs Assistance Bed Mobility: Supine to Sit;Sit to Supine     Supine to sit: Mod assist Sit to supine: Max assist   General bed mobility comments: cues for technique. [place] position used to scoot HOB. Recommended getting in/out on left side.  Transfers Overall transfer level: Needs assistance   Transfers: Sit to/from Stand;Stand Pivot Transfers Sit to Stand: Min guard Stand pivot transfers: Min guard            Balance  Min guard for sit to stand and stand pivot transfers.                                          ADL Overall ADL's : Needs assistance/impaired                     Lower Body Dressing: Maximal assistance;Sit to/from stand   Toilet Transfer: Min guard;Stand-pivot;BSC   Toileting- Water quality scientist and Hygiene: Maximal assistance;Sit to/from stand       Functional mobility during ADLs: Min guard (stand pivot) General ADL Comments: Educated on  AE-including toilet aide and what they could use/where they could purchase. Educated on shoulder information on handout. Educated on safety such as safe shoewear, rugs/items on floor, sitting for LB ADLs.  discussed UB clothing that may be easier to manage. Educated on sling as well as discussed things she could do with right hand (activities).     Vision     Perception     Praxis      Pertinent Vitals/Pain Pain Assessment: 0-10 Pain Score:  (4-5) Pain Location: Rt shoulder Pain Descriptors / Indicators: Aching Pain Intervention(s): Monitored during session;Repositioned     Hand Dominance Right   Extremity/Trunk Assessment Upper Extremity Assessment Upper Extremity Assessment: RUE deficits/detail RUE Deficits / Details: block had not worn off yet; reports arthritis-limited wrist extension and finger extension; edema in RUE RUE Sensation: decreased light touch RUE Coordination: decreased fine motor;decreased gross motor   Lower Extremity Assessment Lower Extremity Assessment: Defer to PT evaluation       Communication Communication Communication: HOH   Cognition Arousal/Alertness: Awake/alert Behavior During Therapy: WFL for tasks assessed/performed Overall Cognitive Status: Within Functional Limits for tasks assessed                     General Comments       Exercises Exercises: Other exercises;Shoulder Other Exercises Other Exercises: OT performed PROM Right  elbow flexion/extension, wrist flexion/extension and moved digits. Also educated on retrograde massage and how pt/daughter could gently move pt's digits.    Shoulder Instructions Shoulder Instructions Donning/doffing shirt without moving shoulder:  (pt able to verbalize technique) Method for sponge bathing under operated UE:  (pt able to verbalize) Donning/doffing sling/immobilizer:  (educated/caregiver assisted OT) Correct positioning of sling/immobilizer:  (pt able to verbalize ) ROM for elbow,  wrist and digits of operated UE:  (PROM performed as pt still numb) Sling wearing schedule (on at all times/off for ADL's): Supervision/safety (pt able to state 2/3 exceptions) Proper positioning of operated UE when showering:  (pt/caregiver able to verbalize) Positioning of UE while sleeping: Patient able to independently direct caregiver    Home Living Family/patient expects to be discharged to:: Private residence Living Arrangements: Alone Available Help at Discharge: Family;Friend(s);Available 24 hours/day Type of Home: House Home Access: Stairs to enter [employer] of Steps: 4-5 Entrance Stairs-Rails: Right;Left;Can reach both Home Layout: One level     Bathroom Shower/Tub: Tub/shower unit;Curtain Shower/tub characteristics: Architectural technologist: Handicapped height     Home Equipment: Tub bench;Cane - single point          Prior Functioning/Environment Level of Independence: Independent             OT Diagnosis: Acute pain   OT Problem List: Decreased strength;Decreased range of motion;Decreased knowledge of use of DME or AE;Decreased knowledge of precautions;Pain;Impaired UE functional use;Impaired sensation;Increased edema;Decreased coordination   OT Treatment/Interventions: Self-care/ADL training;Therapeutic exercise;DME and/or AE instruction;Therapeutic activities;Patient/family education;Balance training    OT Goals(Current goals can be found in the care plan section) Acute Rehab OT Goals Patient Stated Goal: not stated  OT Goal Formulation: With patient Time For Goal Achievement: 09/26/14 Potential to Achieve Goals: Good ADL Goals Additional ADL Goal #1: Pt/caregiver will be independent with ADLs while maintaining shoulder precautions. Additional ADL Goal #2: Pt will be independent with HEP for RUE.  OT Frequency: Min 2X/week   Barriers to D/C:            Co-evaluation              End of Session Equipment Utilized During  Treatment: Other (comment);Oxygen (sling)  Activity Tolerance: Patient tolerated treatment well Patient left: in bed;with call bell/phone within reach;with family/visitor present;with SCD's reapplied   Time: 9924-2683 OT Time Calculation (min): 35 min Charges:  OT General Charges $OT Visit: 1 Procedure OT Evaluation $Initial OT Evaluation Tier I: 1 Procedure OT Treatments $Self Care/Home Management : 8-22 mins G-CodesBenito Mccreedy OTR/L [phone] 09/19/2014, 3:34 PM

## 2014-09-19 NOTE — Op Note (Signed)
09/19/2014  9:36 AM  PATIENT:  Carla Taylor    PRE-OPERATIVE DIAGNOSIS:  Right shoulder primary localized osteoarthritis  POST-OPERATIVE DIAGNOSIS:  Same  PROCEDURE:  RIGHT TOTAL SHOULDER ARTHROPLASTY  SURGEON:  Johnny Bridge, MD  PHYSICIAN ASSISTANT: Joya Gaskins, OPA-C, present and scrubbed throughout the case, critical for completion in a timely fashion, and for retraction, instrumentation, and closure.  ANESTHESIA:   General  PREOPERATIVE INDICATIONS:  REMINGTYN DEPAOLA is a  81 y.o. female with a diagnosis of djd right shoulder who failed conservative measures and elected for surgical management.    The risks benefits and alternatives were discussed with the patient preoperatively including but not limited to the risks of infection, bleeding, nerve injury, cardiopulmonary complications, the need for revision surgery, dislocation, loosening, incomplete relief of pain, among others, and the patient was willing to proceed.   OPERATIVE IMPLANTS: Biomet size 10 mini press-fit humeral stem, size 46+21 Versa-dial humeral head, set in the E position with increased coverage posteriorly, with a medium cemented glenoid polyethylene 3 peg implant with a central regenerex noncemented post.   OPERATIVE FINDINGS: Advanced glenohumeral osteoarthritis involving the glenoid and the humeral head with substantial osteophyte formation inferiorly. There was some partial-thickness tearing of the supraspinatus, but the cable was intact, and no full-thickness tearing. There was substantial inferior glenohumeral osteophyte formation on the humerus, as well as some posterior glenoid osteophyte formation which was removed with a curet.   OPERATIVE PROCEDURE: The patient was brought to the operating room and placed in the supine position. General anesthesia was administered. IV antibiotics were given.  The upper extremity was prepped and draped in usual sterile fashion. The patient was in a beachchair position with  all bony prominences padded.   Time out was performed and a deltopectoral approach was carried out. The biceps tendon was tenodesed to the pectoralis tendon. The subscapularis was released, tagging it with a #2 FiberWire, leaving a cuff of tendon for repair.   The inferior osteophyte was removed, and release of the capsule off of the humeral side was completed. The head was dislocated, and I reamed sequentially. I placed the humeral cutting guide at 30 of retroversion, and then pinned this into place, and made my humeral neck cut. This was at the appropriate level.   I then placed deep retractors and exposed the glenoid. I excised the labrum circumferentially, taking care to protect the axillary nerve inferiorly.   I then placed a guidewire into the center position, controlling appropriate version and inclination. I then reamed over the guidewire with the small reamer, and was satisfied with the preparation. I preserved the subchondral bone in order to maximize the strength and minimize the risk for subsequent subsidence.   I then drilled the central hole for the regenerex peg, and then placed the guide, and then drilled the 3 peripheral peg holes. I had excellent bony circumferential contact.  The superior hole and the anterior hole were a little bit soft at the depth, but I did have a reasonably good endpoint, and the posterior hole and central hole were completely solid.  I then cleaned the glenoid, irrigated it copiously, and then dried it and cemented the prosthesis into place. Excellent seating was achieved. I had full exposure. I turned my attention to the humeral side.   I sequentially broached, up to the selected size, with the broach set at 30 of retroversion. I then placed the real stem. I trialed with multiple heads, and the above-named component was selected.  Increased posterior coverage improved the coverage. The soft tissue tension was appropriate.   I then impacted the real humeral  head into place, reduced the head, and irrigated copiously. Excellent stability and range of motion was achieved. I repaired the subscapularis with 4 #2 Fiberwire, as well as the rotator interval, and irrigated copiously once more. The subcutaneous tissue was closed with Vicryl including the deltopectoral fascia.   The skin was closed with Steri-Strips and sterile gauze was applied. She had a preoperative nerve block. She tolerated the procedure well and there were no complications.

## 2014-09-20 ENCOUNTER — Encounter (HOSPITAL_COMMUNITY): Payer: Self-pay | Admitting: Orthopedic Surgery

## 2014-09-20 LAB — BASIC METABOLIC PANEL
Anion gap: 9 (ref 5–15)
BUN: 19 mg/dL (ref 6–23)
CALCIUM: 8.9 mg/dL (ref 8.4–10.5)
CO2: 27 mmol/L (ref 19–32)
CREATININE: 1.05 mg/dL (ref 0.50–1.10)
Chloride: 101 mmol/L (ref 96–112)
GFR calc Af Amer: 54 mL/min — ABNORMAL LOW (ref >90)
GFR, EST NON AFRICAN AMERICAN: 46 mL/min — AB (ref >90)
Glucose, Bld: 118 mg/dL — ABNORMAL HIGH (ref 70–99)
Potassium: 3.6 mmol/L (ref 3.5–5.1)
Sodium: 137 mmol/L (ref 135–145)

## 2014-09-20 LAB — CBC
HCT: 35.6 % — ABNORMAL LOW (ref 36.0–46.0)
Hemoglobin: 11.3 g/dL — ABNORMAL LOW (ref 12.0–15.0)
MCH: 30.7 pg (ref 26.0–34.0)
MCHC: 31.7 g/dL (ref 30.0–36.0)
MCV: 96.7 fL (ref 78.0–100.0)
Platelets: 147 10*3/uL — ABNORMAL LOW (ref 150–400)
RBC: 3.68 MIL/uL — AB (ref 3.87–5.11)
RDW: 12.9 % (ref 11.5–15.5)
WBC: 8.9 10*3/uL (ref 4.0–10.5)

## 2014-09-20 MED ORDER — CHLORHEXIDINE GLUCONATE CLOTH 2 % EX PADS
6.0000 | MEDICATED_PAD | Freq: Every day | CUTANEOUS | Status: DC
  Administered 2014-09-20 – 2014-09-21 (×2): 6 via TOPICAL

## 2014-09-20 MED ORDER — MUPIROCIN 2 % EX OINT
1.0000 "application " | TOPICAL_OINTMENT | Freq: Two times a day (BID) | CUTANEOUS | Status: DC
  Administered 2014-09-20 – 2014-09-21 (×3): 1 via NASAL
  Filled 2014-09-20: qty 22

## 2014-09-20 NOTE — Clinical Social Work Note (Signed)
CSW received referral for SNF, plan is to discharge home.  CSW to sign off please re-consult if social work needs arise.  Jones Broom. Arab, MSW, [place]

## 2014-09-20 NOTE — Progress Notes (Signed)
Patient ID: Carla Taylor, female   DOB: [birth year], 81 y.o.   MRN: [id number]     Subjective:  Patient reports pain as mild to moderate.  Patient states that she is doing much better today and the feeling is coming back into the hand.  She denies any CP or SOB  Objective:   VITALS:   Filed Vitals:   09/19/14 1119 09/19/14 2011 09/20/14 0044 09/20/14 0447  BP: 133/51 142/62 138/68 142/54  Pulse: 57 62 58 65  Temp: 97.5 F (36.4 C) 97.6 F (36.4 C) 97.5 F (36.4 C) 97.5 F (36.4 C)  TempSrc: Oral     Resp: 16 16 16 16   Height:      Weight:      SpO2: 95% 96% 96% 96%    ABD soft Sensation intact distally Dorsiflexion/Plantar flexion intact Incision: dressing C/D/I and no drainage Good wrist and hand motion  Lab Results  Component Value Date   WBC 8.9 09/20/2014   HGB 11.3* 09/20/2014   HCT 35.6* 09/20/2014   MCV 96.7 09/20/2014   PLT 147* 09/20/2014   BMET    Component Value Date/Time   NA 137 09/20/2014 0628   K 3.6 09/20/2014 0628   CL 101 09/20/2014 0628   CO2 27 09/20/2014 0628   GLUCOSE 118* 09/20/2014 0628   BUN 19 09/20/2014 0628   CREATININE 1.05 09/20/2014 0628   CREATININE 1.01 09/18/2014 0953   CALCIUM 8.9 09/20/2014 0628   GFRNONAA 46* 09/20/2014 0628   GFRNONAA 50* 09/18/2014 0953   GFRAA 54* 09/20/2014 0628   GFRAA 58* 09/18/2014 0953     Assessment/Plan: 1 Day Post-Op   Principal Problem:   Primary osteoarthritis of right shoulder   Advance diet Up with therapy Sling at all times Dry dressing PRN Plan for DC home today after PT if patient is doing well   Remonia Richter 09/20/2014, 8:19 AM  Seen and agree.  Marchia Bond, MD Cell [phone]

## 2014-09-20 NOTE — Progress Notes (Signed)
Occupational Therapy Treatment Patient Details Name: Carla Taylor MRN: [id number] DOB: 06/09/1925 Today's Date: 09/20/2014    History of present illness 81 y.o. Rt TSA.   OT comments  Pt making steady progress. Pt with c/o dizziness after standing. Pt reclined in recliner. BP 120/40. Nsg aware. Completed all education regarding ADL and home safety and need for initial 24/7 S. Pt ready to D/C home with 24/7 S when medically stable.   Follow Up Recommendations  No OT follow up;Supervision/Assistance - 24 hour    Equipment Recommendations  3 in 1 bedside comode    Recommendations for Other Services      Precautions / Restrictions Precautions Precautions: Fall;Shoulder Type of Shoulder Precautions: [place] - no shoulder movement Precaution Comments: no pushing, pulling, lifting with RUE (can use to hold light items); no shoulder movement  Required Braces or Orthoses: Sling (at all times with the exception of bathing and dressing and ) Restrictions Weight Bearing Restrictions: Yes RUE Weight Bearing: Non weight bearing       Mobility Bed Mobility Overal bed mobility: Needs Assistance Bed Mobility: Supine to Sit;Sit to Supine     Supine to sit: Min assist Sit to supine: Supervision   General bed mobility comments: Demonstrated need to either roll to L side to sit up or sleep in recliner to avoid difficulty with bed mobility.  Transfers Overall transfer level: Needs assistance Equipment used: 1 person hand held assist Transfers: Sit to/from [facility] Sit to Stand: Min guard;Supervision Stand pivot transfers: Min guard       General transfer comment: family able to verbalize understanding of level of assistance required    Balance Overall balance assessment: Needs assistance Sitting-balance support: Feet supported;No upper extremity supported Sitting balance-Leahy Scale: Good     Standing balance support: During functional activity Standing  balance-Leahy Scale: Fair Standing balance comment: handheld (A) when performing ADLs in bathroom; required (A) to perform pericare                    ADL                                       Functional mobility during ADLs: Min guard General ADL Comments: Completed education with family regarding compensatory techniques for ADL during ADL session. Educated on precautions and how to adhere to precautions during ADL and mobility. also educated on proper positioning of RUE during ADL, sitting and when in bed. Family states pt plans to sleep in her lift chair. Educated family on need for pt to have initial 24/7 S until she is able to demonstrte the ability to safely care for herself. Also explained that the pt would follow up with Dr. Mardelle Matte for further therapy needs. Pt/family verbalized understanding.                                      Cognition   Behavior During Therapy: WFL for tasks assessed/performed Overall Cognitive Status: Within Functional Limits for tasks assessed                       Extremity/Trunk Assessment  Upper Extremity Assessment Upper Extremity Assessment: Defer to OT evaluation   Lower Extremity Assessment Lower Extremity Assessment: Overall WFL for tasks assessed   Cervical / Trunk Assessment  Cervical / Trunk Assessment: Normal    Exercises Other Exercises Other Exercises: encouraged neck/head ROM Donning/doffing shirt without moving shoulder: Caregiver independent with task Method for sponge bathing under operated UE: Caregiver independent with task Donning/doffing sling/immobilizer: Caregiver independent with task Correct positioning of sling/immobilizer: Caregiver independent with task ROM for elbow, wrist and digits of operated UE: Supervision/safety Sling wearing schedule (on at all times/off for ADL's): Caregiver independent with task Proper positioning of operated UE when showering: Caregiver  independent with task Positioning of UE while sleeping: Caregiver independent with task   Shoulder Instructions Shoulder Instructions Donning/doffing shirt without moving shoulder: Caregiver independent with task Method for sponge bathing under operated UE: Caregiver independent with task Donning/doffing sling/immobilizer: Caregiver independent with task Correct positioning of sling/immobilizer: Caregiver independent with task ROM for elbow, wrist and digits of operated UE: Supervision/safety Sling wearing schedule (on at all times/off for ADL's): Caregiver independent with task Proper positioning of operated UE when showering: Caregiver independent with task Positioning of UE while sleeping: Caregiver independent with task     General Comments      Pertinent Vitals/ Pain       Pain Assessment: 0-10 Pain Score: 5  Pain Location: R shoulder Pain Descriptors / Indicators: Aching Pain Intervention(s): Limited activity within patient's tolerance;Monitored during session;Repositioned;Ice applied  Home Living Family/patient expects to be discharged to:: Private residence Living Arrangements: Alone Available Help at Discharge: Family;Friend(s);Available 24 hours/day Type of Home: House Home Access: Stairs to enter [employer] of Steps: 4-5 Entrance Stairs-Rails: Left;Right Home Layout: One level               Home Equipment: Tub bench;Cane - single point          Prior Functioning/Environment Level of Independence: Independent            Frequency       Progress Toward Goals  OT Goals(current goals can now be found in the care plan section)  Progress towards OT goals: Goals met/education completed, patient discharged from OT  Acute Rehab OT Goals Patient Stated Goal: to go home today OT Goal Formulation: With patient Time For Goal Achievement: 09/26/14 Potential to Achieve Goals: Good ADL Goals Additional ADL Goal #1: Pt/caregiver will be  independent with ADLs while maintaining shoulder precautions. Additional ADL Goal #2: Pt will be independent with HEP for RUE.  Plan Discharge plan remains appropriate    Co-evaluation                 End of Session     Activity Tolerance Patient tolerated treatment well   Patient Left in chair;with call bell/phone within reach;with family/visitor present   Nurse Communication Mobility status;Other (comment) (BP)        Time: 0910-6816 OT Time Calculation (min): 47 min  Charges: OT General Charges $OT Visit: 1 Procedure OT Treatments $Self Care/Home Management : 38-52 mins  Armina Galloway,HILLARY 09/20/2014, 1:36 PM   [facility], OTR/L  [phone] 09/20/2014

## 2014-09-20 NOTE — Care Management Note (Signed)
CARE MANAGEMENT NOTE 09/20/2014  Patient:  Carla Taylor, Carla Taylor   Account Number:  [id number]  Date Initiated:  09/20/2014  Documentation initiated by:  Ricki Miller  Subjective/Objective Assessment:   81 yr old female admitted with DJD of right shoulder. Patient underwent a right total shoulder arthroplasty.     Action/Plan:   Patient was preoperatively setup with [facility]. [place], daughter wants PT to eval at home for safety. Has cane at home.   Anticipated DC Date:  09/21/2014   Anticipated DC Plan:  [place]  CM consult      [facility] Choice  HOME HEALTH   Choice offered to / List presented to:  C-1 Patient        [place] arranged  HH-2 PT      [place]   Status of service:  Completed, signed off Medicare Important Message given?   (If response is "NO", the following Medicare IM given date fields will be blank) Date Medicare IM given:   Medicare IM given by:   Date Additional Medicare IM given:   Additional Medicare IM given by:    Discharge Disposition:  [place]  Per UR Regulation:  Reviewed for med. necessity/level of care/duration of stay  If discussed at [place] of Stay Meetings, dates discussed:    Comments:

## 2014-09-20 NOTE — Evaluation (Signed)
Physical Therapy Evaluation Patient Details Name: Carla Taylor MRN: [id number] DOB: [birth year] Today's Date: 09/20/2014   History of Present Illness  81 y.o. Rt TSA.  Clinical Impression  Patient is s/p above surgery resulting in functional limitations due to the deficits listed below (see PT Problem List).  Patient will benefit from skilled PT to increase their independence and safety with mobility to allow discharge to the venue listed below. Recommend use of cane and 24/7 (A) upon D/C today, pt verbalized understanding.      Follow Up Recommendations No PT follow up;Supervision/Assistance - 24 hour    Equipment Recommendations  None recommended by PT    Recommendations for Other Services       Precautions / Restrictions Precautions Precautions: Fall;Shoulder Precaution Comments: no pushing, pulling, lifting with RUE (can use to hold light items); no shoulder movement  Required Braces or Orthoses: Sling Restrictions Weight Bearing Restrictions: Yes RUE Weight Bearing: Non weight bearing      Mobility  Bed Mobility Overal bed mobility: Needs Assistance Bed Mobility: Supine to Sit;Sit to Supine     Supine to sit: Supervision;HOB elevated Sit to supine: Supervision   General bed mobility comments: min cues for sequencing; allowed [facility] to be elevated; pt reported she will sleep in recliner   Transfers Overall transfer level: Needs assistance Equipment used: 1 person hand held assist Transfers: Sit to/from Stand Sit to Stand: Min guard;Supervision         General transfer comment: min guard to supervision for safety with standing ; cues for technique   Ambulation/Gait Ambulation/Gait assistance: Min guard Ambulation Distance (Feet): 225 Feet Assistive device: 1 person hand held assist Gait Pattern/deviations: Step-through pattern;Decreased stride length;Shuffle Gait velocity: decr Gait velocity interpretation: Below normal speed for age/gender General Gait  Details: recommend use of cane upon D/C home; pt using handheld (A) at times to balance  Stairs Stairs: Yes Stairs assistance: Min guard Stair Management: One rail Left;Step to pattern;Forwards Number of Stairs: 3 General stair comments: cues for safety; min guard to supervision to steady   Wheelchair Mobility    Modified Rankin (Stroke Patients Only)       Balance Overall balance assessment: Needs assistance Sitting-balance support: Feet supported;No upper extremity supported Sitting balance-Leahy Scale: Good     Standing balance support: During functional activity;Single extremity supported Standing balance-Leahy Scale: Poor Standing balance comment: handheld (A) when performing ADLs in bathroom; required (A) to perform pericare                              Pertinent Vitals/Pain Pain Assessment: 0-10 Pain Score: 7  Pain Location: Rt shoulder Pain Descriptors / Indicators: Sore Pain Intervention(s): Monitored during session;Premedicated before session;Repositioned    Home Living Family/patient expects to be discharged to:: Private residence Living Arrangements: Alone Available Help at Discharge: Family;Friend(s);Available 24 hours/day Type of Home: House Home Access: Stairs to enter Entrance Stairs-Rails: Chemical [employer] of Steps: 4-5 Home Layout: One level Home Equipment: Tub bench;Cane - single point      Prior Function Level of Independence: Independent               Hand Dominance   Dominant Hand: Right    Extremity/Trunk Assessment   Upper Extremity Assessment: Defer to OT evaluation           Lower Extremity Assessment: Overall WFL for tasks assessed      Cervical / Trunk Assessment: Normal  Communication  Communication: HOH  Cognition Arousal/Alertness: Awake/alert Behavior During Therapy: WFL for tasks assessed/performed Overall Cognitive Status: Within Functional Limits for tasks assessed                       General Comments General comments (skin integrity, edema, etc.): discussed D/C recommendations in regards to using cane for mobility    Exercises        Assessment/Plan    PT Assessment Patient needs continued PT services  PT Diagnosis Difficulty walking;Acute pain   PT Problem List Decreased strength;Decreased balance;Decreased activity tolerance;Decreased mobility;Decreased knowledge of use of DME;Pain  PT Treatment Interventions DME instruction;Gait training;Stair training;Functional mobility training;Therapeutic activities;Therapeutic exercise;Balance training;Neuromuscular re-education;Patient/family education   PT Goals (Current goals can be found in the Care Plan section) Acute Rehab PT Goals Patient Stated Goal: to go home today PT Goal Formulation: With patient Time For Goal Achievement: 09/23/14 Potential to Achieve Goals: Good    Frequency Min 4X/week   Barriers to discharge        Co-evaluation               End of Session Equipment Utilized During Treatment: Gait belt;Other (comment) (sling) Activity Tolerance: Patient tolerated treatment well Patient left: in bed;with call bell/phone within reach Nurse Communication: Mobility status         Time: 0569-7948 PT Time Calculation (min) (ACUTE ONLY): 24 min   Charges:   PT Evaluation $Initial PT Evaluation Tier I: 1 Procedure PT Treatments $Gait Training: 8-22 mins   PT G CodesGustavus Bryant, [place]  [phone] 09/20/2014, 10:04 AM

## 2014-09-20 NOTE — Progress Notes (Signed)
Pt BP is 114/40 and pulse 67.  MD notified of BP and ordered to hold all 1000 BP meds except for metoprolol.  Will carry out orders and continue to monitor pt. Graceann Congress

## 2014-09-20 NOTE — Discharge Summary (Signed)
Physician Discharge Summary  Patient ID: Carla Taylor MRN: [id number] DOB/AGE: April 05, 1925 81 y.o.  Admit date: 09/19/2014 Discharge date: 09/20/2014  Admission Diagnoses:  Primary osteoarthritis of right shoulder  Discharge Diagnoses:  Principal Problem:   Primary osteoarthritis of right shoulder   Past Medical History  Diagnosis Date  . Arthritis   . Hypertension   . Chronic shoulder pain   . Hyperlipidemia   . Hypothyroid   . Pancreatitis   . Shortness of breath dyspnea     on exertion  . Macular degeneration   . Primary osteoarthritis of right shoulder 09/19/2014    Surgeries: Procedure(s): RIGHT TOTAL SHOULDER ARTHROPLASTY on 09/19/2014   Consultants (if any):    Discharged Condition: Improved  Hospital Course: Carla Taylor is an 81 y.o. female who was admitted 09/19/2014 with a diagnosis of Primary osteoarthritis of right shoulder and went to the operating room on 09/19/2014 and underwent the above named procedures.    She was given perioperative antibiotics:  Anti-infectives    Start     Dose/Rate Route Frequency Ordered Stop   09/19/14 1900  vancomycin (VANCOCIN) IVPB 1000 mg/200 mL premix     1,000 mg 200 mL/hr over 60 Minutes Intravenous Every 12 hours 09/19/14 1119 09/19/14 2145   09/19/14 1400  ceFAZolin (ANCEF) IVPB 2 g/50 mL premix     2 g 100 mL/hr over 30 Minutes Intravenous 3 times per day 09/19/14 1119 09/19/14 2217   09/19/14 0723  vancomycin (VANCOCIN) 1 GM/200ML IVPB    Comments:  Carla Taylor   : cabinet override      09/19/14 0723 09/19/14 0819   09/19/14 0600  ceFAZolin (ANCEF) IVPB 2 g/50 mL premix     2 g 100 mL/hr over 30 Minutes Intravenous On call to O.R. 09/18/14 1412 09/19/14 0751    .  She was given sequential compression devices, early ambulation for DVT prophylaxis.  She benefited maximally from the hospital stay and there were no complications.    Recent vital signs:  Filed Vitals:   09/20/14 0447  BP: 142/54  Pulse: 65  Temp:  97.5 F (36.4 C)  Resp: 16    Recent laboratory studies:  Lab Results  Component Value Date   HGB 11.3* 09/20/2014   HGB 13.0 09/07/2014   HGB 13.5 05/05/2014   Lab Results  Component Value Date   WBC 8.9 09/20/2014   PLT 147* 09/20/2014   Lab Results  Component Value Date   INR 1.09 05/05/2014   Lab Results  Component Value Date   NA 137 09/20/2014   K 3.6 09/20/2014   CL 101 09/20/2014   CO2 27 09/20/2014   BUN 19 09/20/2014   CREATININE 1.05 09/20/2014   GLUCOSE 118* 09/20/2014    Discharge Medications:     Medication List    STOP taking these medications        acetaminophen 325 MG tablet  Commonly known as:  TYLENOL      TAKE these medications        aspirin EC 81 MG tablet  Take 81 mg by mouth daily.     baclofen 10 MG tablet  Commonly known as:  LIORESAL  Take 1 tablet (10 mg total) by mouth 3 (three) times daily. As needed for muscle spasm     calcium carbonate 1250 (500 CA) MG tablet  Commonly known as:  OS-CAL - dosed in mg of elemental calcium  Take 1 tablet by mouth 2 (two) times daily  with a meal.     doxazosin 1 MG tablet  Commonly known as:  CARDURA  Take 1 mg by mouth daily.     hydrochlorothiazide 25 MG tablet  Commonly known as:  HYDRODIURIL  Take 25 mg by mouth daily.     HYDROcodone-acetaminophen 5-325 MG per tablet  Commonly known as:  NORCO  Take 1-2 tablets by mouth every 6 (six) hours as needed for moderate pain. MAXIMUM TOTAL ACETAMINOPHEN DOSE IS 4000 MG PER DAY     ICAPS AREDS FORMULA PO  Take 1 tablet by mouth 2 (two) times daily.     levothyroxine 88 MCG tablet  Commonly known as:  SYNTHROID, LEVOTHROID  Take 88 mcg by mouth daily before breakfast.     lisinopril 20 MG tablet  Commonly known as:  PRINIVIL,ZESTRIL  Take 1 tablet (20 mg total) by mouth daily.     metoprolol succinate 50 MG 24 hr tablet  Commonly known as:  TOPROL-XL  TAKE ONE AND ONE HALF TABLETS ONCE DAILY     MILK OF MAGNESIA PO  Take 1  tablet by mouth as needed.     sennosides-docusate sodium 8.6-50 MG tablet  Commonly known as:  SENOKOT-S  Take 2 tablets by mouth daily.     SYSTANE 0.4-0.3 % Soln  Generic drug:  Polyethyl Glycol-Propyl Glycol  Place 1 drop into both eyes as needed (for dry eyes).     vitamin B-12 500 MCG tablet  Commonly known as:  CYANOCOBALAMIN  Take 500 mcg by mouth daily.     Vitamin D (Ergocalciferol) 50000 UNITS Caps capsule  Commonly known as:  DRISDOL  Take 50,000 Units by mouth every Sunday.        Diagnostic Studies: Dg Shoulder Right Port  Sep 22, 2014   CLINICAL DATA:  Primary osteoarthritis of right shoulder.  EXAM: PORTABLE RIGHT SHOULDER - 2+ VIEW  COMPARISON:  May 05, 2014.  FINDINGS: Status post right shoulder arthroplasty. Components appear well situated. No fracture or dislocation is noted. Degenerative change of right acromioclavicular joint is noted. Visualized ribs appear normal.  IMPRESSION: Status post right shoulder arthroplasty.   Electronically Signed   By: Marijo Conception, M.D.   On: Sep 22, 2014 12:03    Disposition: 06-Home-Health Care Svc        Follow-up Information    Follow up with Johnny Bridge, MD. Schedule an appointment as soon as possible for a visit in 2 weeks.   Specialty:  Orthopedic Surgery   Contact information:   [place] [address] [phone]        Signed: Johnny Bridge 09/20/2014, 8:28 AM

## 2014-09-21 NOTE — Progress Notes (Signed)
Numbness 4th, 5th fingers remains from nerve block

## 2014-09-21 NOTE — Progress Notes (Signed)
Patient education/ d/c planning complete, Rx given, discharge summary signed and patient/ daughter retained one copy. [place] lock access removed.Received transfer bench for use in her BR at home. Left via w/c with volunteer and accompanied by her daughter.

## 2014-09-21 NOTE — Care Management Note (Signed)
CARE MANAGEMENT NOTE 09/21/2014  Patient:  Carla Taylor, Carla Taylor   Account Number:  [id number]  Date Initiated:  09/20/2014  Documentation initiated by:  Ricki Miller  Subjective/Objective Assessment:   81 yr old female admitted with DJD of right shoulder. Patient underwent a right total shoulder arthroplasty.     Action/Plan:   Patient was preoperatively setup with [facility]. [place], daughter wants PT to eval at home for safety. Has cane at home.   Anticipated DC Date:  09/21/2014   Anticipated DC Plan:  [place]  CM consult      [facility] Choice  HOME HEALTH   Choice offered to / List presented to:  C-1 Patient   DME arranged  [facility]      DME agency  [place] arranged  [place]   Status of service:  Completed, signed off Medicare Important Message given?  NA - LOS <3 / Initial given by admissions (If response is "NO", the following Medicare IM given date fields will be blank) Date Medicare IM given:   Medicare IM given by:   Date Additional Medicare IM given:   Additional Medicare IM given by:    Discharge Disposition:  [place]  Per UR Regulation:  Reviewed for med. necessity/level of care/duration of stay  If discussed at [place] of Stay Meetings, dates discussed:    Comments:

## 2014-09-27 DIAGNOSIS — M19011 Primary osteoarthritis, right shoulder: Secondary | ICD-10-CM | POA: Diagnosis not present

## 2014-10-16 DIAGNOSIS — E039 Hypothyroidism, unspecified: Secondary | ICD-10-CM | POA: Diagnosis not present

## 2014-10-17 ENCOUNTER — Encounter (HOSPITAL_COMMUNITY): Payer: Self-pay | Admitting: Emergency Medicine

## 2014-10-17 ENCOUNTER — Emergency Department (HOSPITAL_COMMUNITY)
Admission: EM | Admit: 2014-10-17 | Discharge: 2014-10-17 | Disposition: A | Discharge status: Home / Self Care | Payer: Medicare Other | Attending: Emergency Medicine | Admitting: Emergency Medicine

## 2014-10-17 DIAGNOSIS — G8929 Other chronic pain: Secondary | ICD-10-CM | POA: Diagnosis not present

## 2014-10-17 DIAGNOSIS — E039 Hypothyroidism, unspecified: Secondary | ICD-10-CM | POA: Diagnosis not present

## 2014-10-17 DIAGNOSIS — M1990 Unspecified osteoarthritis, unspecified site: Secondary | ICD-10-CM | POA: Diagnosis not present

## 2014-10-17 DIAGNOSIS — I10 Essential (primary) hypertension: Secondary | ICD-10-CM | POA: Insufficient documentation

## 2014-10-17 DIAGNOSIS — L309 Dermatitis, unspecified: Secondary | ICD-10-CM | POA: Diagnosis not present

## 2014-10-17 DIAGNOSIS — L570 Actinic keratosis: Secondary | ICD-10-CM | POA: Diagnosis not present

## 2014-10-17 DIAGNOSIS — Z7982 Long term (current) use of aspirin: Secondary | ICD-10-CM | POA: Diagnosis not present

## 2014-10-17 DIAGNOSIS — Z8719 Personal history of other diseases of the digestive system: Secondary | ICD-10-CM | POA: Insufficient documentation

## 2014-10-17 DIAGNOSIS — Z79899 Other long term (current) drug therapy: Secondary | ICD-10-CM | POA: Diagnosis not present

## 2014-10-17 LAB — CBC WITH DIFFERENTIAL/PLATELET
BASOS ABS: 0 10*3/uL (ref 0.0–0.1)
Basophils Relative: 1 % (ref 0–1)
EOS ABS: 0.1 10*3/uL (ref 0.0–0.7)
Eosinophils Relative: 2 % (ref 0–5)
HCT: 38.6 % (ref 36.0–46.0)
HEMOGLOBIN: 12.4 g/dL (ref 12.0–15.0)
LYMPHS PCT: 28 % (ref 12–46)
Lymphs Abs: 1.9 10*3/uL (ref 0.7–4.0)
MCH: 30.1 pg (ref 26.0–34.0)
MCHC: 32.1 g/dL (ref 30.0–36.0)
MCV: 93.7 fL (ref 78.0–100.0)
MONOS PCT: 8 % (ref 3–12)
Monocytes Absolute: 0.5 10*3/uL (ref 0.1–1.0)
NEUTROS PCT: 61 % (ref 43–77)
Neutro Abs: 4.2 10*3/uL (ref 1.7–7.7)
PLATELETS: 199 10*3/uL (ref 150–400)
RBC: 4.12 MIL/uL (ref 3.87–5.11)
RDW: 13 % (ref 11.5–15.5)
WBC: 6.8 10*3/uL (ref 4.0–10.5)

## 2014-10-17 LAB — COMPREHENSIVE METABOLIC PANEL
ALT: 13 U/L (ref 0–35)
ANION GAP: 3 — AB (ref 5–15)
AST: 19 U/L (ref 0–37)
Albumin: 3.7 g/dL (ref 3.5–5.2)
Alkaline Phosphatase: 82 U/L (ref 39–117)
BILIRUBIN TOTAL: 0.6 mg/dL (ref 0.3–1.2)
BUN: 21 mg/dL (ref 6–23)
CO2: 33 mmol/L — AB (ref 19–32)
CREATININE: 0.91 mg/dL (ref 0.50–1.10)
Calcium: 10.2 mg/dL (ref 8.4–10.5)
Chloride: 104 mmol/L (ref 96–112)
GFR calc Af Amer: 64 mL/min — ABNORMAL LOW (ref >90)
GFR, EST NON AFRICAN AMERICAN: 55 mL/min — AB (ref >90)
Glucose, Bld: 130 mg/dL — ABNORMAL HIGH (ref 70–99)
Potassium: 3.9 mmol/L (ref 3.5–5.1)
Sodium: 140 mmol/L (ref 135–145)
Total Protein: 7.4 g/dL (ref 6.0–8.3)

## 2014-10-17 MED ORDER — AMLODIPINE BESYLATE 2.5 MG PO TABS: 2.5000 mg | ORAL_TABLET | Freq: Once | ORAL | Status: DC

## 2014-10-17 MED ORDER — AMLODIPINE BESYLATE 5 MG PO TABS
5.0000 mg | ORAL_TABLET | Freq: Once | ORAL | Status: AC
  Administered 2014-10-17: 5 mg via ORAL
  Filled 2014-10-17: qty 1

## 2014-10-17 NOTE — ED Provider Notes (Signed)
CSN: [id number]     Arrival date & time 10/17/14  1514 History   First MD Initiated Contact with Patient 10/17/14 1714     Chief Complaint  Patient presents with  . Hypertension    HPI Pt went to her doctors office for a routine follow up visit.  Her blood pressure was checked in the office and it was over 546 systolic.  Her doctor evaluated her and suggested she come to the ED to be evaluated.  SHe is feeling fine otherwise.  No cp or shortness of breath.  No vomiting or diarrhea.  She does have htn and has been taking her medications.  It usually in the 170/80 range. Past Medical History  Diagnosis Date  . Arthritis   . Hypertension   . Chronic shoulder pain   . Hyperlipidemia   . Hypothyroid   . Pancreatitis   . Shortness of breath dyspnea     on exertion  . Macular degeneration   . Primary osteoarthritis of right shoulder 09/19/2014   Past Surgical History  Procedure Laterality Date  . No previous surgeries    . Carpal tunnel Bilateral   . Cataract extraction Bilateral     2005  . Knee arthroscopy Bilateral     2001  . Total shoulder arthroplasty Right 09/19/2014    Procedure: RIGHT TOTAL SHOULDER ARTHROPLASTY;  Surgeon: Johnny Bridge, MD;  Location: [facility];  Service: Orthopedics;  Laterality: Right;   Family History  Problem Relation Age of Onset  . CVA Mother   . CVA Father    History  Substance Use Topics  . Smoking status: Never Smoker   . Smokeless tobacco: Not on file  . Alcohol Use: No   OB History    No data available     Review of Systems  All other systems reviewed and are negative.     Allergies  Review of patient's allergies indicates no known allergies.  Home Medications   Prior to Admission medications   Medication Sig Start Date End Date Taking? Authorizing Provider  aspirin EC 81 MG tablet Take 81 mg by mouth daily.    Historical Provider, MD  baclofen (LIORESAL) 10 MG tablet Take 1 tablet (10 mg total) by mouth 3 (three) times daily. As  needed for muscle spasm 09/19/14   Marchia Bond, MD  calcium carbonate (OS-CAL - DOSED IN MG OF ELEMENTAL CALCIUM) 1250 MG tablet Take 1 tablet by mouth 2 (two) times daily with a meal.    Historical Provider, MD  doxazosin (CARDURA) 1 MG tablet Take 1 mg by mouth daily.    Historical Provider, MD  hydrochlorothiazide (HYDRODIURIL) 25 MG tablet Take 25 mg by mouth daily.  04/29/14   Historical Provider, MD  HYDROcodone-acetaminophen (NORCO) 5-325 MG per tablet Take 1-2 tablets by mouth every 6 (six) hours as needed for moderate pain. MAXIMUM TOTAL ACETAMINOPHEN DOSE IS 4000 MG PER DAY 09/19/14   Marchia Bond, MD  levothyroxine (SYNTHROID, LEVOTHROID) 88 MCG tablet Take 88 mcg by mouth daily before breakfast.    Historical Provider, MD  lisinopril (PRINIVIL,ZESTRIL) 20 MG tablet Take 1 tablet (20 mg total) by mouth daily. Patient taking differently: Take 40 mg by mouth daily.  09/13/14   Lelon Perla, MD  Magnesium Hydroxide (MILK OF MAGNESIA PO) Take 1 tablet by mouth as needed.    Historical Provider, MD  metoprolol succinate (TOPROL-XL) 50 MG 24 hr tablet TAKE ONE AND ONE HALF TABLETS ONCE DAILY 09/13/14  Lelon Perla, MD  Multiple Vitamins-Minerals (ICAPS AREDS FORMULA PO) Take 1 tablet by mouth 2 (two) times daily.    Historical Provider, MD  Polyethyl Glycol-Propyl Glycol (SYSTANE) 0.4-0.3 % SOLN Place 1 drop into both eyes as needed (for dry eyes).     Historical Provider, MD  sennosides-docusate sodium (SENOKOT-S) 8.6-50 MG tablet Take 2 tablets by mouth daily. 09/19/14   Marchia Bond, MD  vitamin B-12 (CYANOCOBALAMIN) 500 MCG tablet Take 500 mcg by mouth daily.    Historical Provider, MD  Vitamin D, Ergocalciferol, (DRISDOL) 50000 UNITS CAPS capsule Take 50,000 Units by mouth every Sunday.    Historical Provider, MD   BP 230/72 mmHg  Pulse 53  Temp(Src) 98.3 F (36.8 C) (Oral)  Resp 15  SpO2 99% Physical Exam  Constitutional: She appears well-developed and well-nourished. No  distress.  HENT:  Head: Normocephalic and atraumatic.  Right Ear: External ear normal.  Left Ear: External ear normal.  Eyes: Conjunctivae are normal. Right eye exhibits no discharge. Left eye exhibits no discharge. No scleral icterus.  Neck: Neck supple. No tracheal deviation present.  Cardiovascular: Normal rate, regular rhythm and intact distal pulses.   Pulmonary/Chest: Effort normal and breath sounds normal. No stridor. No respiratory distress. She has no wheezes. She has no rales.  Abdominal: Soft. Bowel sounds are normal. She exhibits no distension. There is no tenderness. There is no rebound and no guarding.  Musculoskeletal: She exhibits no edema or tenderness.  Neurological: She is alert. She has normal strength. No cranial nerve deficit (no facial droop, extraocular movements intact, no slurred speech) or sensory deficit. She exhibits normal muscle tone. She displays no seizure activity. Coordination normal.  Skin: Skin is warm and dry. No rash noted.  Psychiatric: She has a normal mood and affect.  Nursing note and vitals reviewed.   ED Course  Procedures (including critical care time) Labs Review Labs Reviewed  COMPREHENSIVE METABOLIC PANEL - Abnormal; Notable for the following:    CO2 33 (*)    Glucose, Bld 130 (*)    GFR calc non Af Amer 55 (*)    GFR calc Af Amer 64 (*)    Anion gap 3 (*)    All other components within normal limits  CBC WITH DIFFERENTIAL/PLATELET      EKG Normal sinus rhythm rate 52 Normal axis, normal intervals Normal ST-T waves No prior ECG for comparison MDM   Final diagnoses:  Essential hypertension    The patient has asymptomatic hypertension without any evidence of acute signs of secondary injury. Patient used to be on Norvasc but developed lower extremity swelling so she had her medications changed. I will add a low dose of Norvasc in the short-term until she could follow up with her primary doctor.  At this time there does not  appear to be any evidence of an acute emergency medical condition and the patient appears stable for discharge with appropriate outpatient follow up.     Dorie Rank, MD 10/17/14 [phone]

## 2014-10-17 NOTE — Discharge Instructions (Signed)

## 2014-10-17 NOTE — ED Notes (Signed)
Pt sent by PCP for htn; pt denies complaint and had sx 4 weeks ago on right shoulder

## 2014-10-20 DIAGNOSIS — C44319 Basal cell carcinoma of skin of other parts of face: Secondary | ICD-10-CM | POA: Diagnosis not present

## 2014-10-20 DIAGNOSIS — L570 Actinic keratosis: Secondary | ICD-10-CM | POA: Diagnosis not present

## 2014-10-20 DIAGNOSIS — D485 Neoplasm of uncertain behavior of skin: Secondary | ICD-10-CM | POA: Diagnosis not present

## 2014-10-20 DIAGNOSIS — L821 Other seborrheic keratosis: Secondary | ICD-10-CM | POA: Diagnosis not present

## 2014-10-25 DIAGNOSIS — M19011 Primary osteoarthritis, right shoulder: Secondary | ICD-10-CM | POA: Diagnosis not present

## 2014-10-26 DIAGNOSIS — M6281 Muscle weakness (generalized): Secondary | ICD-10-CM | POA: Diagnosis not present

## 2014-10-26 DIAGNOSIS — Z96611 Presence of right artificial shoulder joint: Secondary | ICD-10-CM | POA: Diagnosis not present

## 2014-10-26 DIAGNOSIS — M25611 Stiffness of right shoulder, not elsewhere classified: Secondary | ICD-10-CM | POA: Diagnosis not present

## 2014-10-26 DIAGNOSIS — M25511 Pain in right shoulder: Secondary | ICD-10-CM | POA: Diagnosis not present

## 2014-10-30 DIAGNOSIS — M25611 Stiffness of right shoulder, not elsewhere classified: Secondary | ICD-10-CM | POA: Diagnosis not present

## 2014-10-30 DIAGNOSIS — I10 Essential (primary) hypertension: Secondary | ICD-10-CM | POA: Diagnosis not present

## 2014-10-30 DIAGNOSIS — M6281 Muscle weakness (generalized): Secondary | ICD-10-CM | POA: Diagnosis not present

## 2014-10-30 DIAGNOSIS — M25511 Pain in right shoulder: Secondary | ICD-10-CM | POA: Diagnosis not present

## 2014-10-30 DIAGNOSIS — Z96611 Presence of right artificial shoulder joint: Secondary | ICD-10-CM | POA: Diagnosis not present

## 2014-11-01 DIAGNOSIS — Z96611 Presence of right artificial shoulder joint: Secondary | ICD-10-CM | POA: Diagnosis not present

## 2014-11-01 DIAGNOSIS — M6281 Muscle weakness (generalized): Secondary | ICD-10-CM | POA: Diagnosis not present

## 2014-11-01 DIAGNOSIS — M25611 Stiffness of right shoulder, not elsewhere classified: Secondary | ICD-10-CM | POA: Diagnosis not present

## 2014-11-01 DIAGNOSIS — M25511 Pain in right shoulder: Secondary | ICD-10-CM | POA: Diagnosis not present

## 2014-11-06 DIAGNOSIS — Z96611 Presence of right artificial shoulder joint: Secondary | ICD-10-CM | POA: Diagnosis not present

## 2014-11-06 DIAGNOSIS — M25511 Pain in right shoulder: Secondary | ICD-10-CM | POA: Diagnosis not present

## 2014-11-06 DIAGNOSIS — M6281 Muscle weakness (generalized): Secondary | ICD-10-CM | POA: Diagnosis not present

## 2014-11-06 DIAGNOSIS — M25611 Stiffness of right shoulder, not elsewhere classified: Secondary | ICD-10-CM | POA: Diagnosis not present

## 2014-11-09 DIAGNOSIS — M25511 Pain in right shoulder: Secondary | ICD-10-CM | POA: Diagnosis not present

## 2014-11-09 DIAGNOSIS — Z96611 Presence of right artificial shoulder joint: Secondary | ICD-10-CM | POA: Diagnosis not present

## 2014-11-09 DIAGNOSIS — M25611 Stiffness of right shoulder, not elsewhere classified: Secondary | ICD-10-CM | POA: Diagnosis not present

## 2014-11-09 DIAGNOSIS — M6281 Muscle weakness (generalized): Secondary | ICD-10-CM | POA: Diagnosis not present

## 2014-11-14 DIAGNOSIS — M25511 Pain in right shoulder: Secondary | ICD-10-CM | POA: Diagnosis not present

## 2014-11-14 DIAGNOSIS — M6281 Muscle weakness (generalized): Secondary | ICD-10-CM | POA: Diagnosis not present

## 2014-11-14 DIAGNOSIS — Z96611 Presence of right artificial shoulder joint: Secondary | ICD-10-CM | POA: Diagnosis not present

## 2014-11-14 DIAGNOSIS — M25611 Stiffness of right shoulder, not elsewhere classified: Secondary | ICD-10-CM | POA: Diagnosis not present

## 2014-11-17 DIAGNOSIS — Z96611 Presence of right artificial shoulder joint: Secondary | ICD-10-CM | POA: Diagnosis not present

## 2014-11-17 DIAGNOSIS — M6281 Muscle weakness (generalized): Secondary | ICD-10-CM | POA: Diagnosis not present

## 2014-11-17 DIAGNOSIS — M25611 Stiffness of right shoulder, not elsewhere classified: Secondary | ICD-10-CM | POA: Diagnosis not present

## 2014-11-17 DIAGNOSIS — M25511 Pain in right shoulder: Secondary | ICD-10-CM | POA: Diagnosis not present

## 2014-11-20 DIAGNOSIS — Z96611 Presence of right artificial shoulder joint: Secondary | ICD-10-CM | POA: Diagnosis not present

## 2014-11-20 DIAGNOSIS — M6281 Muscle weakness (generalized): Secondary | ICD-10-CM | POA: Diagnosis not present

## 2014-11-20 DIAGNOSIS — M25511 Pain in right shoulder: Secondary | ICD-10-CM | POA: Diagnosis not present

## 2014-11-20 DIAGNOSIS — M25611 Stiffness of right shoulder, not elsewhere classified: Secondary | ICD-10-CM | POA: Diagnosis not present

## 2014-11-22 DIAGNOSIS — M25511 Pain in right shoulder: Secondary | ICD-10-CM | POA: Diagnosis not present

## 2014-11-23 DIAGNOSIS — M6281 Muscle weakness (generalized): Secondary | ICD-10-CM | POA: Diagnosis not present

## 2014-11-23 DIAGNOSIS — Z96611 Presence of right artificial shoulder joint: Secondary | ICD-10-CM | POA: Diagnosis not present

## 2014-11-23 DIAGNOSIS — M25511 Pain in right shoulder: Secondary | ICD-10-CM | POA: Diagnosis not present

## 2014-11-23 DIAGNOSIS — M25611 Stiffness of right shoulder, not elsewhere classified: Secondary | ICD-10-CM | POA: Diagnosis not present

## 2014-11-28 DIAGNOSIS — Z96611 Presence of right artificial shoulder joint: Secondary | ICD-10-CM | POA: Diagnosis not present

## 2014-11-28 DIAGNOSIS — M25611 Stiffness of right shoulder, not elsewhere classified: Secondary | ICD-10-CM | POA: Diagnosis not present

## 2014-11-28 DIAGNOSIS — M25511 Pain in right shoulder: Secondary | ICD-10-CM | POA: Diagnosis not present

## 2014-11-28 DIAGNOSIS — M6281 Muscle weakness (generalized): Secondary | ICD-10-CM | POA: Diagnosis not present

## 2014-11-29 DIAGNOSIS — Z1231 Encounter for screening mammogram for malignant neoplasm of breast: Secondary | ICD-10-CM | POA: Diagnosis not present

## 2014-11-30 DIAGNOSIS — E559 Vitamin D deficiency, unspecified: Secondary | ICD-10-CM | POA: Diagnosis not present

## 2014-11-30 DIAGNOSIS — C4491 Basal cell carcinoma of skin, unspecified: Secondary | ICD-10-CM | POA: Diagnosis not present

## 2014-11-30 DIAGNOSIS — Z6835 Body mass index (BMI) 35.0-35.9, adult: Secondary | ICD-10-CM | POA: Diagnosis not present

## 2014-11-30 DIAGNOSIS — E039 Hypothyroidism, unspecified: Secondary | ICD-10-CM | POA: Diagnosis not present

## 2014-11-30 DIAGNOSIS — I10 Essential (primary) hypertension: Secondary | ICD-10-CM | POA: Diagnosis not present

## 2014-12-07 DIAGNOSIS — C44319 Basal cell carcinoma of skin of other parts of face: Secondary | ICD-10-CM | POA: Diagnosis not present

## 2014-12-11 NOTE — Progress Notes (Signed)
HPI: FU hypertension and edema. Previously seen for preoperative evaluation. Echocardiogram November 2015 showed vigorous LV function, grade 1 diastolic dysfunction, mild mitral regurgitation and mild tricuspid regurgitation. Mildly elevated pulmonary pressures. Nuclear study November 2015 showed an ejection fraction of 71% and normal perfusion. Note amlodipine previously DCed due to pedal edema. Since I last saw her, she has some dyspnea on exertion unchanged. No orthopnea or PND. She has not developed pedal edema on lower dose Norvasc. No chest pain, palpitations or syncope.  Current Outpatient Prescriptions  Medication Sig Dispense Refill  . amLODipine (NORVASC) 2.5 MG tablet Take 1 tablet (2.5 mg total) by mouth once. (Patient taking differently: Take 5 mg by mouth once. ) 14 tablet 0  . aspirin EC 81 MG tablet Take 81 mg by mouth daily.    . calcium carbonate (OS-CAL - DOSED IN MG OF ELEMENTAL CALCIUM) 1250 MG tablet Take 1 tablet by mouth 2 (two) times daily with a meal.    . doxazosin (CARDURA) 1 MG tablet Take 1 mg by mouth daily.    . hydrochlorothiazide (HYDRODIURIL) 25 MG tablet Take 25 mg by mouth daily.     Marland Kitchen levothyroxine (SYNTHROID, LEVOTHROID) 88 MCG tablet Take 88 mcg by mouth daily before breakfast.    . lisinopril (PRINIVIL,ZESTRIL) 20 MG tablet Take 1 tablet (20 mg total) by mouth daily. (Patient taking differently: Take 40 mg by mouth daily. ) 90 tablet 3  . Magnesium Hydroxide (MILK OF MAGNESIA PO) Take 1 tablet by mouth as needed.    . metoprolol succinate (TOPROL-XL) 50 MG 24 hr tablet TAKE ONE AND ONE HALF TABLETS ONCE DAILY 135 tablet 3  . Multiple Vitamins-Minerals (ICAPS AREDS FORMULA PO) Take 1 tablet by mouth 2 (two) times daily.    Vladimir Faster Glycol-Propyl Glycol (SYSTANE) 0.4-0.3 % SOLN Place 1 drop into both eyes as needed (for dry eyes).     . sennosides-docusate sodium (SENOKOT-S) 8.6-50 MG tablet Take 2 tablets by mouth daily. 30 tablet 1  . vitamin B-12  (CYANOCOBALAMIN) 500 MCG tablet Take 500 mcg by mouth daily.    . Vitamin D, Ergocalciferol, (DRISDOL) 50000 UNITS CAPS capsule Take 50,000 Units by mouth every Sunday.     No current facility-administered medications for this visit.   Facility-Administered Medications Ordered in Other Visits  Medication Dose Route Frequency Provider Last Rate Last Dose  . vancomycin (VANCOCIN) 1,000 mg in sodium chloride 0.9 % 500 mL IVPB  1,000 mg Intravenous Once Joshua Landau, MD         Past Medical History  Diagnosis Date  . Arthritis   . Hypertension   . Chronic shoulder pain   . Hyperlipidemia   . Hypothyroid   . Pancreatitis   . Shortness of breath dyspnea     on exertion  . Macular degeneration   . Primary osteoarthritis of right shoulder 09/19/2014    Past Surgical History  Procedure Laterality Date  . No previous surgeries    . Carpal tunnel Bilateral   . Cataract extraction Bilateral     2005  . Knee arthroscopy Bilateral     20 01  . Total shoulder arthroplasty Right 09/19/2014    Procedure: RIGHT TOTAL SHOULDER ARTHROPLASTY;  Surgeon: Johnny Bridge, MD;  Location: [facility];  Service: Orthopedics;  Laterality: Right;    History   Social History  . Marital Status: Widowed    Spouse Name: N/A  . Number of Children: 4  . Years of Education:  N/A   Occupational History  .  [employer]   Social History Main Topics  . Smoking status: Never Smoker   . Smokeless tobacco: Not on file  . Alcohol Use: No  . Drug Use: No  . Sexual Activity: No   Other Topics Concern  . Not on file   Social History Narrative    ROS: no fevers or chills, productive cough, hemoptysis, dysphasia, odynophagia, melena, hematochezia, dysuria, hematuria, rash, seizure activity, orthopnea, PND, pedal edema, claudication. Remaining systems are negative.  Physical Exam: Well-developed well-nourished in no acute distress.  Skin is warm and dry.  HEENT is normal.  Neck is supple.  Chest is  clear to auscultation with normal expansion.  Cardiovascular exam is regular rate and rhythm.  Abdominal exam nontender or distended. No masses palpated. Extremities show no edema. neuro grossly intact

## 2014-12-12 ENCOUNTER — Ambulatory Visit (INDEPENDENT_AMBULATORY_CARE_PROVIDER_SITE_OTHER): Payer: Medicare Other | Admitting: Cardiology

## 2014-12-12 ENCOUNTER — Encounter: Payer: Self-pay | Admitting: Cardiology

## 2014-12-12 VITALS — BP 190/94 | HR 68 | Ht 62.0 in | Wt 189.5 lb

## 2014-12-12 DIAGNOSIS — I10 Essential (primary) hypertension: Secondary | ICD-10-CM | POA: Diagnosis not present

## 2014-12-12 MED ORDER — DOXAZOSIN MESYLATE 2 MG PO TABS: 2.0000 mg | ORAL_TABLET | Freq: Every day | ORAL | Status: DC

## 2014-12-12 NOTE — Assessment & Plan Note (Signed)
Blood pressure remains elevated. She is on maximum dose lisinopril. She also takes 25 of hydrochlorothiazide and 75 of Toprol. I cannot advance her beta blocker further as her heart rate has been in the 50s. I cannot advance her Norvasc as she developed pedal edema on higher doses previously. Increase Cardura to 2 mg daily. Follow blood pressure and we will advance this further as needed. Goal systolic blood pressure approximately 150.

## 2014-12-12 NOTE — Assessment & Plan Note (Signed)
Resolved on lower dose amlodipine.

## 2014-12-12 NOTE — Patient Instructions (Signed)
INCREASE Cardura to 2mg  daily.  Your physician recommends that you schedule a follow-up appointment in: 6 months with Dr. Stanford Breed.

## 2015-01-03 DIAGNOSIS — M25511 Pain in right shoulder: Secondary | ICD-10-CM | POA: Diagnosis not present

## 2015-01-08 ENCOUNTER — Telehealth: Payer: Self-pay | Admitting: Cardiology

## 2015-01-08 DIAGNOSIS — Z01810 Encounter for preprocedural cardiovascular examination: Secondary | ICD-10-CM

## 2015-01-08 DIAGNOSIS — I10 Essential (primary) hypertension: Secondary | ICD-10-CM

## 2015-01-08 DIAGNOSIS — R609 Edema, unspecified: Secondary | ICD-10-CM

## 2015-01-08 MED ORDER — LISINOPRIL 40 MG PO TABS: 40.0000 mg | ORAL_TABLET | Freq: Every day | ORAL | Status: DC

## 2015-01-08 NOTE — Telephone Encounter (Signed)
Patient notified that medication was increased to 40mg  daily in Feb 2016 - was not updated on list or Rx'ed as this increased dose  Rx(s) sent to pharmacy electronically.

## 2015-01-08 NOTE — Telephone Encounter (Signed)
Pt need to know what milligram of Lisinopril is she supposed to be taking? If it is 22 mg,she needs a new prescription.

## 2015-01-19 DIAGNOSIS — D235 Other benign neoplasm of skin of trunk: Secondary | ICD-10-CM | POA: Diagnosis not present

## 2015-01-19 DIAGNOSIS — L821 Other seborrheic keratosis: Secondary | ICD-10-CM | POA: Diagnosis not present

## 2015-01-19 DIAGNOSIS — D485 Neoplasm of uncertain behavior of skin: Secondary | ICD-10-CM | POA: Diagnosis not present

## 2015-01-19 DIAGNOSIS — L723 Sebaceous cyst: Secondary | ICD-10-CM | POA: Diagnosis not present

## 2015-01-19 DIAGNOSIS — Z808 Family history of malignant neoplasm of other organs or systems: Secondary | ICD-10-CM | POA: Diagnosis not present

## 2015-01-19 DIAGNOSIS — L570 Actinic keratosis: Secondary | ICD-10-CM | POA: Diagnosis not present

## 2015-01-19 DIAGNOSIS — Z85828 Personal history of other malignant neoplasm of skin: Secondary | ICD-10-CM | POA: Diagnosis not present

## 2015-01-19 DIAGNOSIS — D2272 Melanocytic nevi of left lower limb, including hip: Secondary | ICD-10-CM | POA: Diagnosis not present

## 2015-01-19 DIAGNOSIS — D229 Melanocytic nevi, unspecified: Secondary | ICD-10-CM | POA: Diagnosis not present

## 2015-03-01 DIAGNOSIS — Z1389 Encounter for screening for other disorder: Secondary | ICD-10-CM | POA: Diagnosis not present

## 2015-03-01 DIAGNOSIS — I10 Essential (primary) hypertension: Secondary | ICD-10-CM | POA: Diagnosis not present

## 2015-03-01 DIAGNOSIS — J069 Acute upper respiratory infection, unspecified: Secondary | ICD-10-CM | POA: Diagnosis not present

## 2015-03-01 DIAGNOSIS — C4491 Basal cell carcinoma of skin, unspecified: Secondary | ICD-10-CM | POA: Diagnosis not present

## 2015-03-01 DIAGNOSIS — E039 Hypothyroidism, unspecified: Secondary | ICD-10-CM | POA: Diagnosis not present

## 2015-03-01 DIAGNOSIS — E559 Vitamin D deficiency, unspecified: Secondary | ICD-10-CM | POA: Diagnosis not present

## 2015-03-01 DIAGNOSIS — Z6834 Body mass index (BMI) 34.0-34.9, adult: Secondary | ICD-10-CM | POA: Diagnosis not present

## 2015-03-22 DIAGNOSIS — Z23 Encounter for immunization: Secondary | ICD-10-CM | POA: Diagnosis not present

## 2015-04-05 DIAGNOSIS — Z96611 Presence of right artificial shoulder joint: Secondary | ICD-10-CM | POA: Diagnosis not present

## 2015-04-05 DIAGNOSIS — M542 Cervicalgia: Secondary | ICD-10-CM | POA: Diagnosis not present

## 2015-04-05 DIAGNOSIS — M1711 Unilateral primary osteoarthritis, right knee: Secondary | ICD-10-CM | POA: Diagnosis not present

## 2015-04-12 DIAGNOSIS — M1711 Unilateral primary osteoarthritis, right knee: Secondary | ICD-10-CM | POA: Diagnosis not present

## 2015-04-23 DIAGNOSIS — M1711 Unilateral primary osteoarthritis, right knee: Secondary | ICD-10-CM | POA: Diagnosis not present

## 2015-05-21 DIAGNOSIS — M1711 Unilateral primary osteoarthritis, right knee: Secondary | ICD-10-CM | POA: Diagnosis not present

## 2015-06-05 DIAGNOSIS — H35033 Hypertensive retinopathy, bilateral: Secondary | ICD-10-CM | POA: Diagnosis not present

## 2015-06-05 DIAGNOSIS — H353130 Nonexudative age-related macular degeneration, bilateral, stage unspecified: Secondary | ICD-10-CM | POA: Insufficient documentation

## 2015-06-05 DIAGNOSIS — H43813 Vitreous degeneration, bilateral: Secondary | ICD-10-CM | POA: Insufficient documentation

## 2015-06-05 DIAGNOSIS — Z961 Presence of intraocular lens: Secondary | ICD-10-CM | POA: Insufficient documentation

## 2015-06-07 ENCOUNTER — Ambulatory Visit (INDEPENDENT_AMBULATORY_CARE_PROVIDER_SITE_OTHER): Payer: Medicare Other | Admitting: Cardiology

## 2015-06-07 ENCOUNTER — Encounter: Payer: Self-pay | Admitting: Cardiology

## 2015-06-07 VITALS — BP 120/60 | HR 67 | Ht 62.5 in | Wt 188.3 lb

## 2015-06-07 DIAGNOSIS — R609 Edema, unspecified: Secondary | ICD-10-CM | POA: Diagnosis not present

## 2015-06-07 DIAGNOSIS — I10 Essential (primary) hypertension: Secondary | ICD-10-CM | POA: Diagnosis not present

## 2015-06-07 NOTE — Progress Notes (Signed)
HPI: FU hypertension and edema. Echocardiogram November 2015 showed vigorous LV function, grade 1 diastolic dysfunction, mild mitral regurgitation and mild tricuspid regurgitation. Mildly elevated pulmonary pressures. Nuclear study November 2015 showed an ejection fraction of 71% and normal perfusion. Note amlodipine previously DCed due to pedal edema. Since I last saw her, the patient has dyspnea with more extreme activities but not with routine activities. It is relieved with rest. It is not associated with chest pain. There is no orthopnea, PND or pedal edema. There is no syncope or palpitations. There is no exertional chest pain.   Current Outpatient Prescriptions  Medication Sig Dispense Refill  . amLODipine (NORVASC) 2.5 MG tablet Take 1 tablet (2.5 mg total) by mouth once. (Patient taking differently: Take 5 mg by mouth once. ) 14 tablet 0  . aspirin EC 81 MG tablet Take 81 mg by mouth daily.    . calcium carbonate (OS-CAL - DOSED IN MG OF ELEMENTAL CALCIUM) 1250 MG tablet Take 1 tablet by mouth 2 (two) times daily with a meal.    . doxazosin (CARDURA) 2 MG tablet Take 1 tablet (2 mg total) by mouth daily. 90 tablet 3  . hydrochlorothiazide (HYDRODIURIL) 25 MG tablet Take 25 mg by mouth daily.     Marland Kitchen levothyroxine (SYNTHROID, LEVOTHROID) 88 MCG tablet Take 88 mcg by mouth daily before breakfast.    . lisinopril (PRINIVIL,ZESTRIL) 40 MG tablet Take 1 tablet (40 mg total) by mouth daily. 90 tablet 3  . Magnesium Hydroxide (MILK OF MAGNESIA PO) Take 1 tablet by mouth as needed.    . metoprolol succinate (TOPROL-XL) 50 MG 24 hr tablet TAKE ONE AND ONE HALF TABLETS ONCE DAILY 135 tablet 3  . Multiple Vitamins-Minerals (ICAPS AREDS FORMULA PO) Take 1 tablet by mouth 2 (two) times daily.    Vladimir Faster Glycol-Propyl Glycol (SYSTANE) 0.4-0.3 % SOLN Place 1 drop into both eyes as needed (for dry eyes).     . sennosides-docusate sodium (SENOKOT-S) 8.6-50 MG tablet Take 2 tablets by mouth daily.  30 tablet 1  . vitamin B-12 (CYANOCOBALAMIN) 500 MCG tablet Take 500 mcg by mouth daily.    . Vitamin D, Ergocalciferol, (DRISDOL) 50000 UNITS CAPS capsule Take 50,000 Units by mouth every Sunday.     No current facility-administered medications for this visit.   Facility-Administered Medications Ordered in Other Visits  Medication Dose Route Frequency Provider Last Rate Last Dose  . vancomycin (VANCOCIN) 1,000 mg in sodium chloride 0.9 % 500 mL IVPB  1,000 mg Intravenous Once Joshua Landau, MD         Past Medical History  Diagnosis Date  . Arthritis   . Hypertension   . Chronic shoulder pain   . Hyperlipidemia   . Hypothyroid   . Pancreatitis   . Shortness of breath dyspnea     on exertion  . Macular degeneration   . Primary osteoarthritis of right shoulder 09/19/2014    Past Surgical History  Procedure Laterality Date  . No previous surgeries    . Carpal tunnel Bilateral   . Cataract extraction Bilateral     2005  . Knee arthroscopy Bilateral     20 01  . Total shoulder arthroplasty Right 09/19/2014    Procedure: RIGHT TOTAL SHOULDER ARTHROPLASTY;  Surgeon: Johnny Bridge, MD;  Location: [place] [facility];  Service: Orthopedics;  Laterality: Right;    Social History   Social History  . Marital Status: Widowed    Spouse Name: N/A  .  Number of Children: 4  . Years of Education: N/A   Occupational History  .  [employer]   Social History Main Topics  . Smoking status: Never Smoker   . Smokeless tobacco: Not on file  . Alcohol Use: No  . Drug Use: No  . Sexual Activity: No   Other Topics Concern  . Not on file   Social History Narrative    ROS: no fevers or chills, productive cough, hemoptysis, dysphasia, odynophagia, melena, hematochezia, dysuria, hematuria, rash, seizure activity, orthopnea, PND, pedal edema, claudication. Remaining systems are negative.  Physical Exam: Well-developed well-nourished in no acute distress.  Skin is warm and dry.  HEENT is  normal.  Neck is supple.  Chest is clear to auscultation with normal expansion.  Cardiovascular exam is regular rate and rhythm.  Abdominal exam nontender or distended. No masses palpated. Extremities show no edema. neuro grossly intact  ECG Sinus rhythm at a rate of 67. Normal axis. Nonspecific ST changes. Cannot rule out prior septal infarct.

## 2015-06-07 NOTE — Patient Instructions (Signed)
Your physician recommends that you schedule a follow-up appointment in: AS NEEDED  

## 2015-06-07 NOTE — Assessment & Plan Note (Signed)
Blood pressure controlled. Continue present medications. 

## 2015-06-07 NOTE — Assessment & Plan Note (Signed)
Resolved after decreasing amlodipine. No further evaluation.

## 2015-08-23 DIAGNOSIS — D2271 Melanocytic nevi of right lower limb, including hip: Secondary | ICD-10-CM | POA: Diagnosis not present

## 2015-08-23 DIAGNOSIS — D224 Melanocytic nevi of scalp and neck: Secondary | ICD-10-CM | POA: Diagnosis not present

## 2015-08-23 DIAGNOSIS — Z23 Encounter for immunization: Secondary | ICD-10-CM | POA: Diagnosis not present

## 2015-08-23 DIAGNOSIS — L821 Other seborrheic keratosis: Secondary | ICD-10-CM | POA: Diagnosis not present

## 2015-08-23 DIAGNOSIS — L570 Actinic keratosis: Secondary | ICD-10-CM | POA: Diagnosis not present

## 2015-08-23 DIAGNOSIS — Z808 Family history of malignant neoplasm of other organs or systems: Secondary | ICD-10-CM | POA: Diagnosis not present

## 2015-08-23 DIAGNOSIS — D2272 Melanocytic nevi of left lower limb, including hip: Secondary | ICD-10-CM | POA: Diagnosis not present

## 2015-08-23 DIAGNOSIS — Z85828 Personal history of other malignant neoplasm of skin: Secondary | ICD-10-CM | POA: Diagnosis not present

## 2015-09-03 DIAGNOSIS — E039 Hypothyroidism, unspecified: Secondary | ICD-10-CM | POA: Diagnosis not present

## 2015-09-03 DIAGNOSIS — M25569 Pain in unspecified knee: Secondary | ICD-10-CM | POA: Diagnosis not present

## 2015-09-03 DIAGNOSIS — I10 Essential (primary) hypertension: Secondary | ICD-10-CM | POA: Diagnosis not present

## 2015-09-03 DIAGNOSIS — Z23 Encounter for immunization: Secondary | ICD-10-CM | POA: Diagnosis not present

## 2015-09-03 DIAGNOSIS — E559 Vitamin D deficiency, unspecified: Secondary | ICD-10-CM | POA: Diagnosis not present

## 2015-09-18 IMAGING — US US ABDOMEN LIMITED
1 series · 14 of 25 positions shown · non-contrast
Comparison: CT of the abdomen and pelvis 05/05/2014.

CLINICAL DATA: Epigastric abdominal pain. Pancreatitis. Elevated
LFTs. Question gallstones.

EXAM:
US ABDOMEN LIMITED - RIGHT UPPER QUADRANT

[Series 1: us abdomen limited · 0.27mm/px · 14 of 39 slices shown]
[im 1/39]
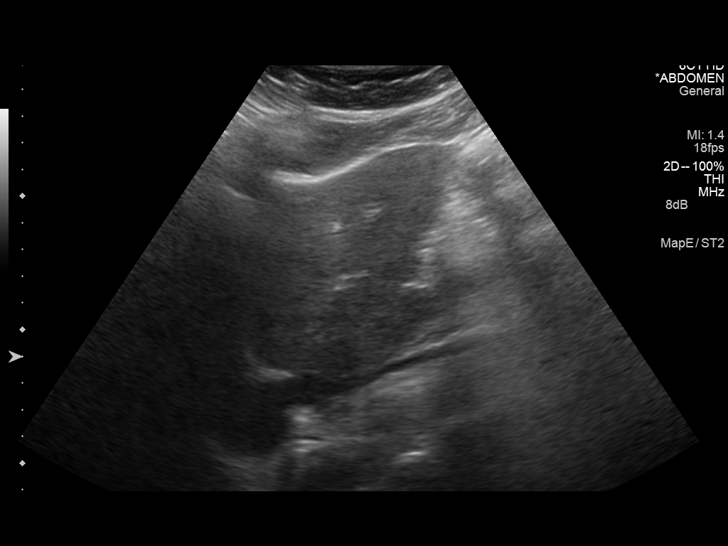
[im 4/39]
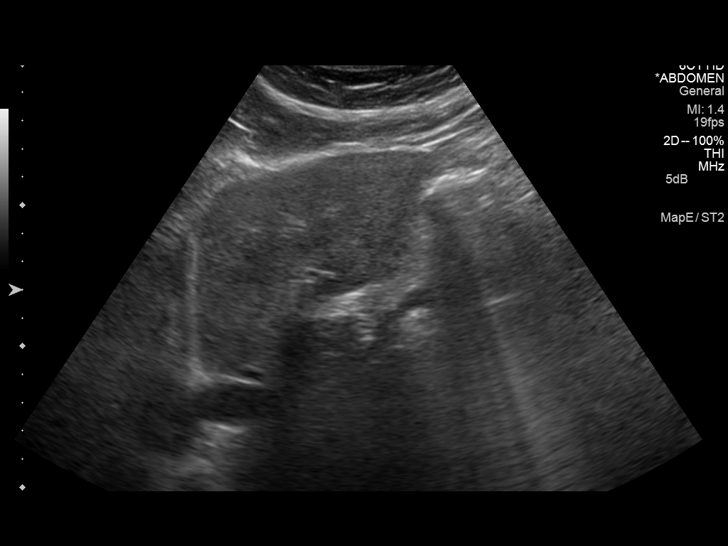
[im 7/39]
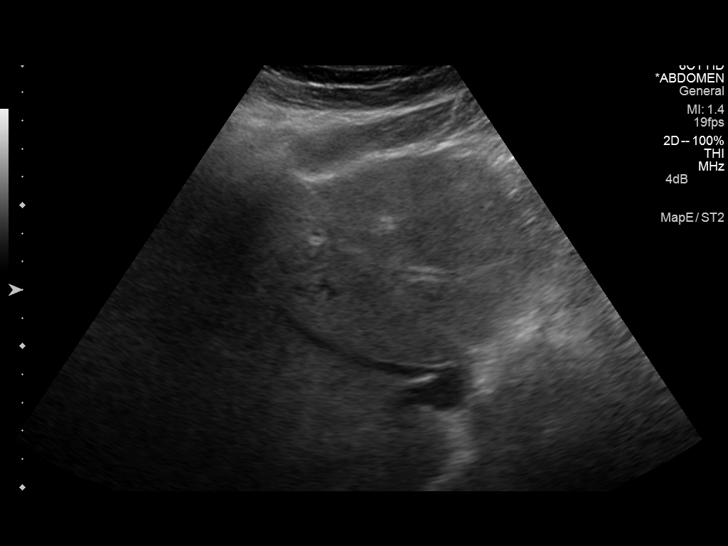
[im 10/39]
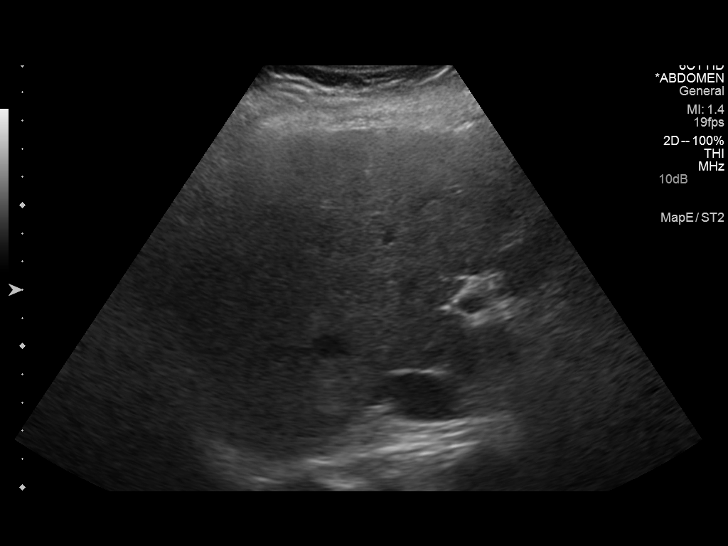
[im 13/39]
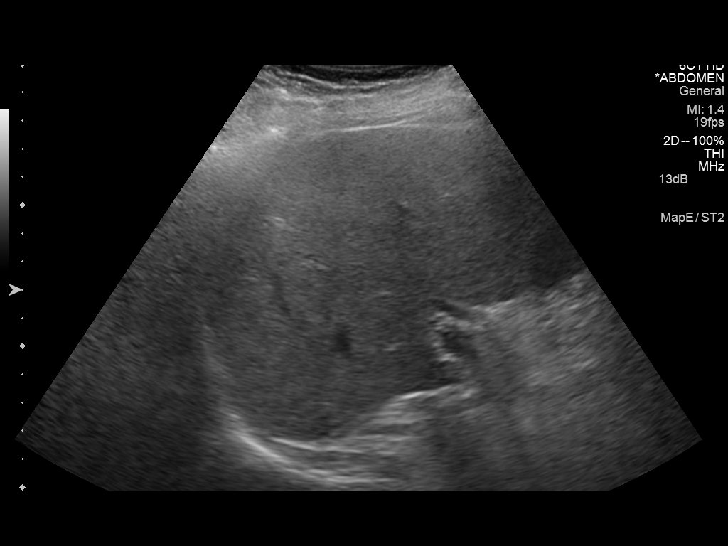
[im 15/39]
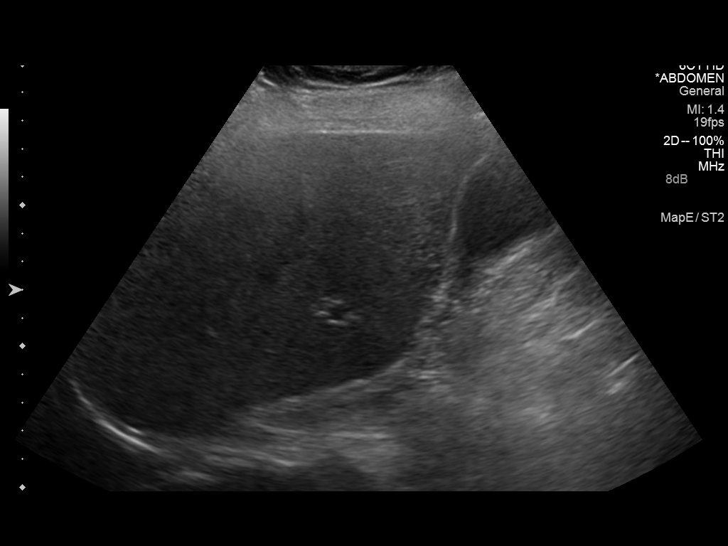
[im 18/39]
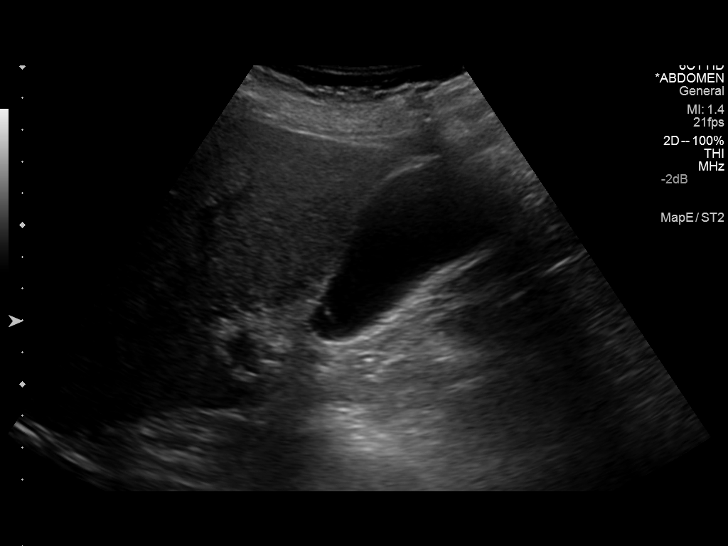
[im 21/39]
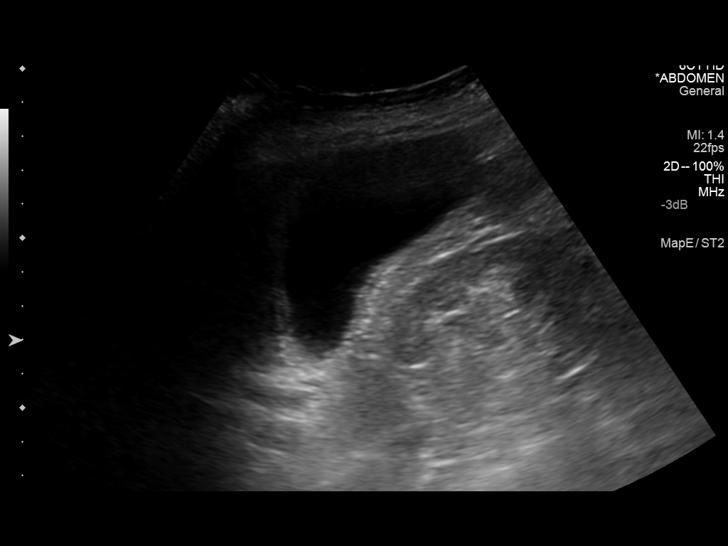
[im 24/39]
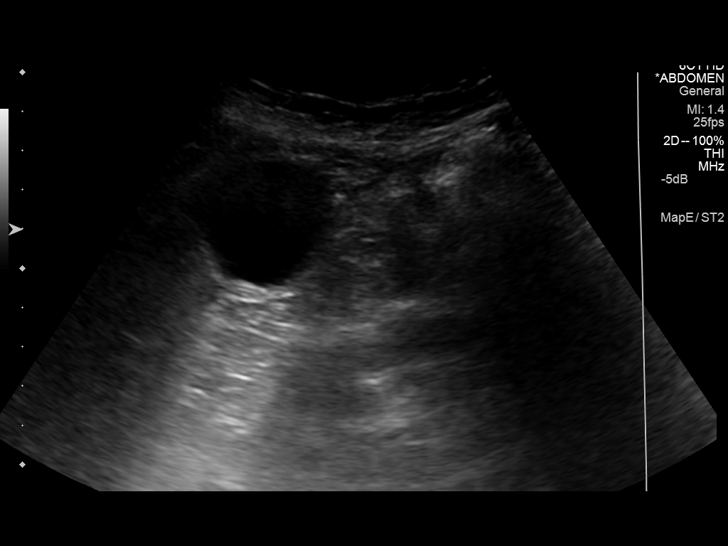
[im 26/39]
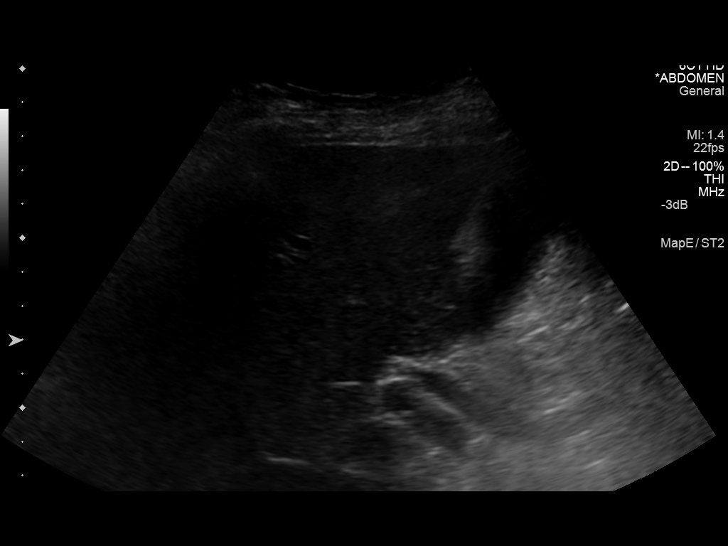
[im 29/39]
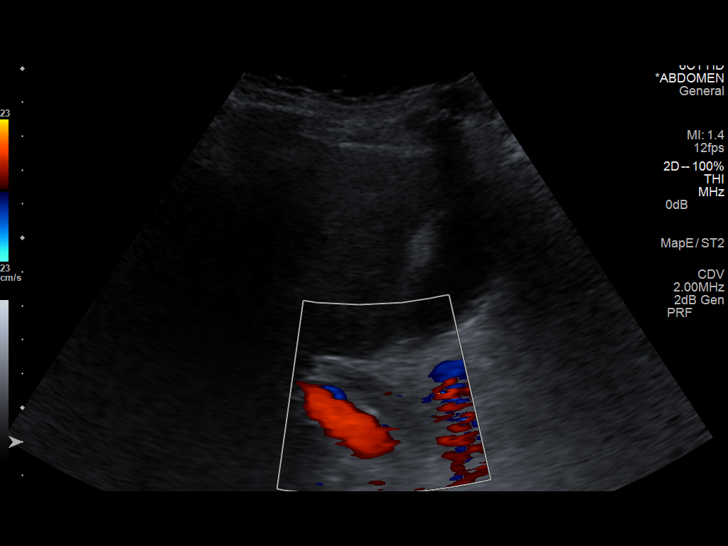
[im 32/39]
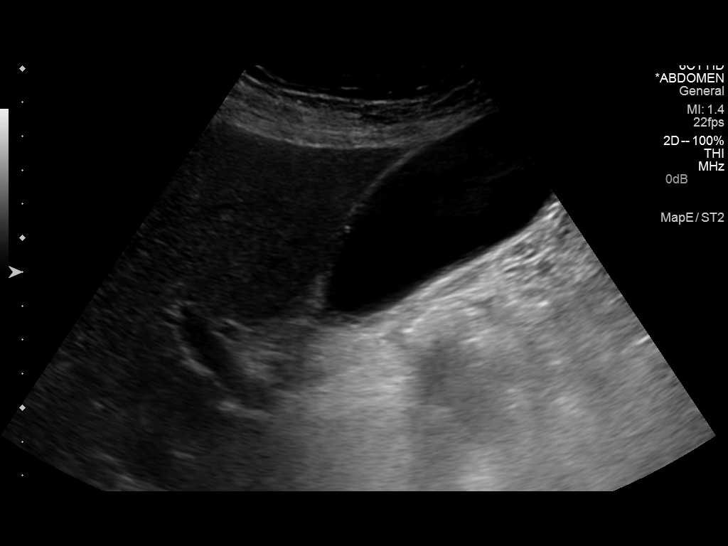
[im 35/39]
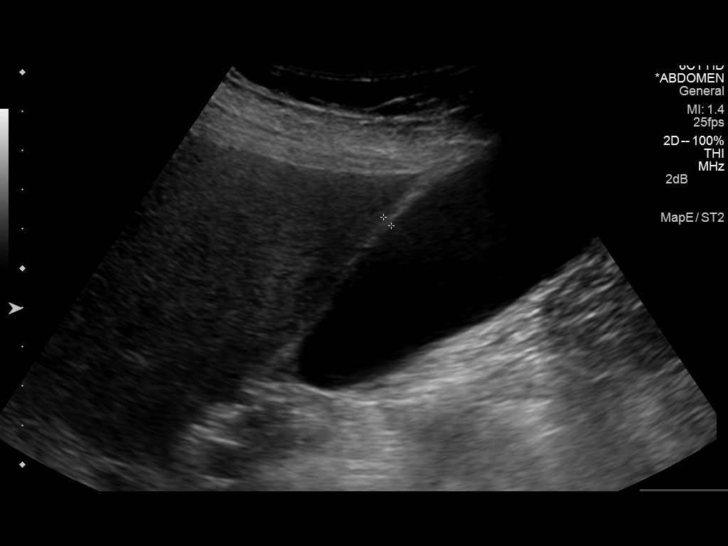
[im 39/39]
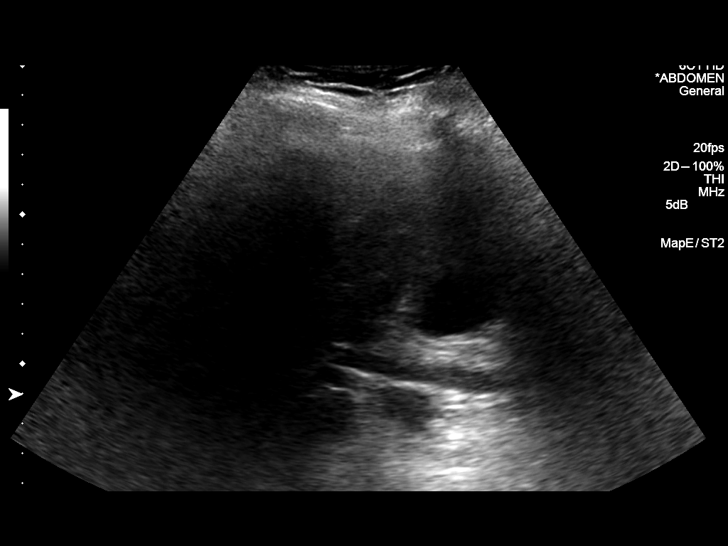

[14 of 25 positions shown; findings below may reference images not displayed]

FINDINGS: Gallbladder:

No gallstones or wall thickening visualized. No sonographic Murphy
sign noted. Wall thickness is 2.9 mm, the upper limits of normal.

Common bile duct:

Diameter: 7.0 mm, within normal limits for age.

Liver:

No focal lesion identified. Within normal limits in parenchymal
echogenicity.
IMPRESSION: Negative right upper quadrant ultrasound.

## 2015-09-18 IMAGING — CT CT ABD-PELV W/O CM
2 of 4 series · 9 of 46 positions shown, 10 images · non-contrast
Comparison: Prior in ultrasound from 12/12/2011

CLINICAL DATA: In ischial evaluation for acute onset severe
abdominal pain

EXAM:
CT ABDOMEN AND PELVIS WITHOUT CONTRAST
TECHNIQUE: Multidetector CT imaging of the abdomen and pelvis was performed
following the standard protocol without IV contrast.

[Series 201: routine, idose (2) · axial · 0.83mm/px · z∈[+29,+429]mm · 6 of 96 slices shown, 7 images]
[im 8/96  soft-tissue]
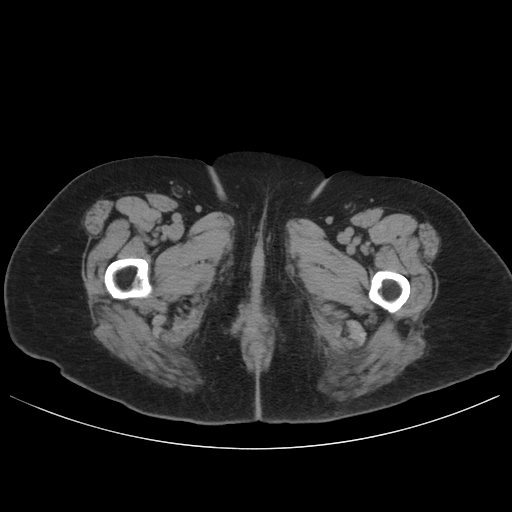
[im 8/96  bone]
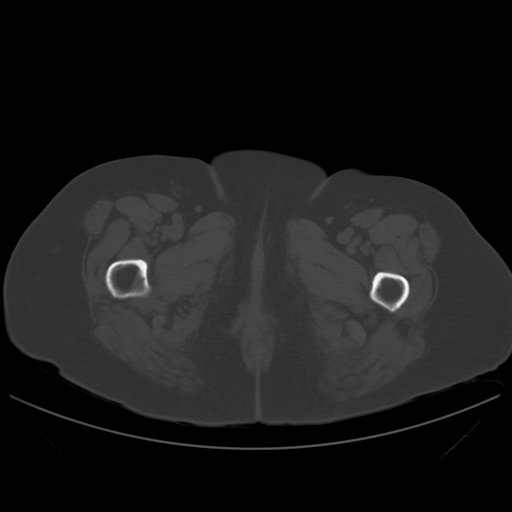
[im 24/96  soft-tissue]
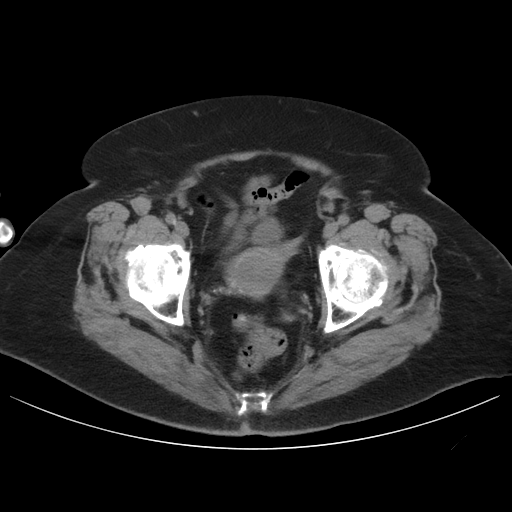
[im 40/96  soft-tissue]
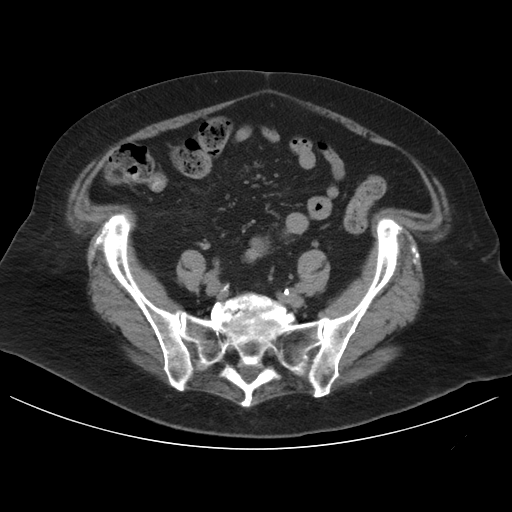
[im 56/96  soft-tissue]
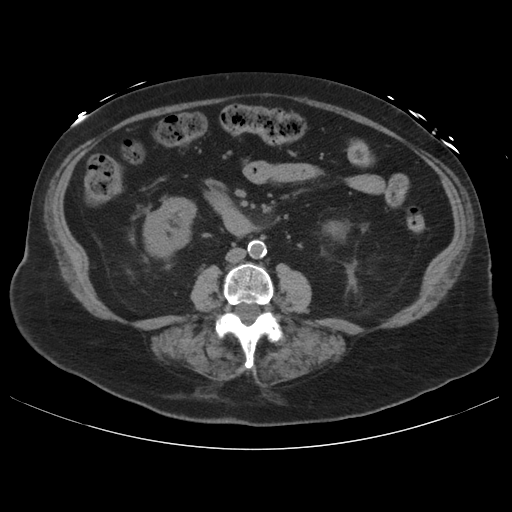
[im 72/96  soft-tissue]
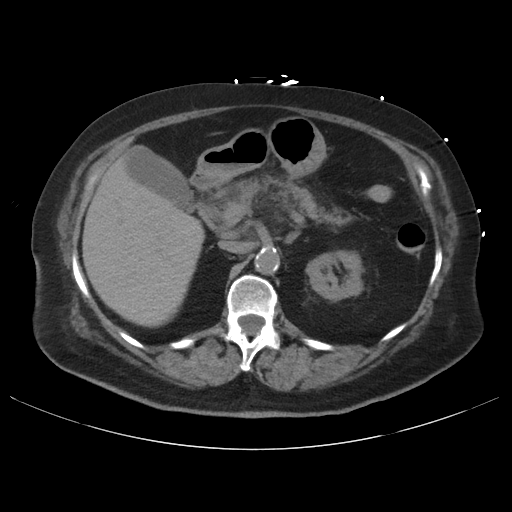
[im 88/96  soft-tissue]
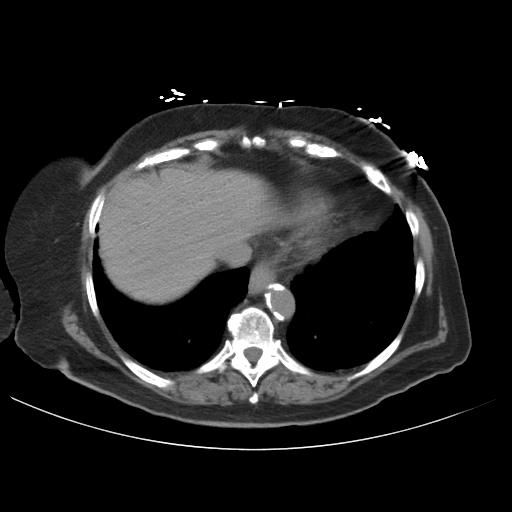

[Series 202: coronals, idose (2) · coronal · 0.45mm/px · 3 of 119 slices shown]
[im 40/119  soft-tissue]
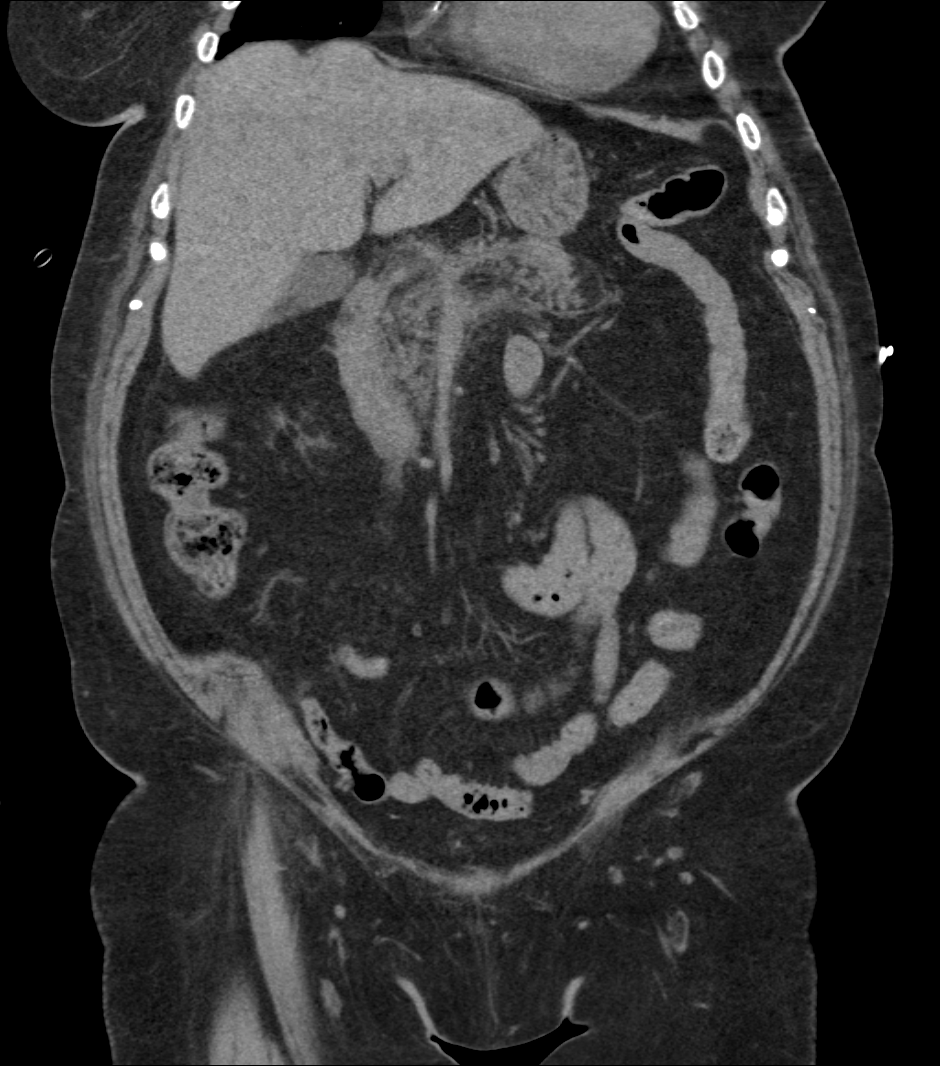
[im 53/119  soft-tissue]
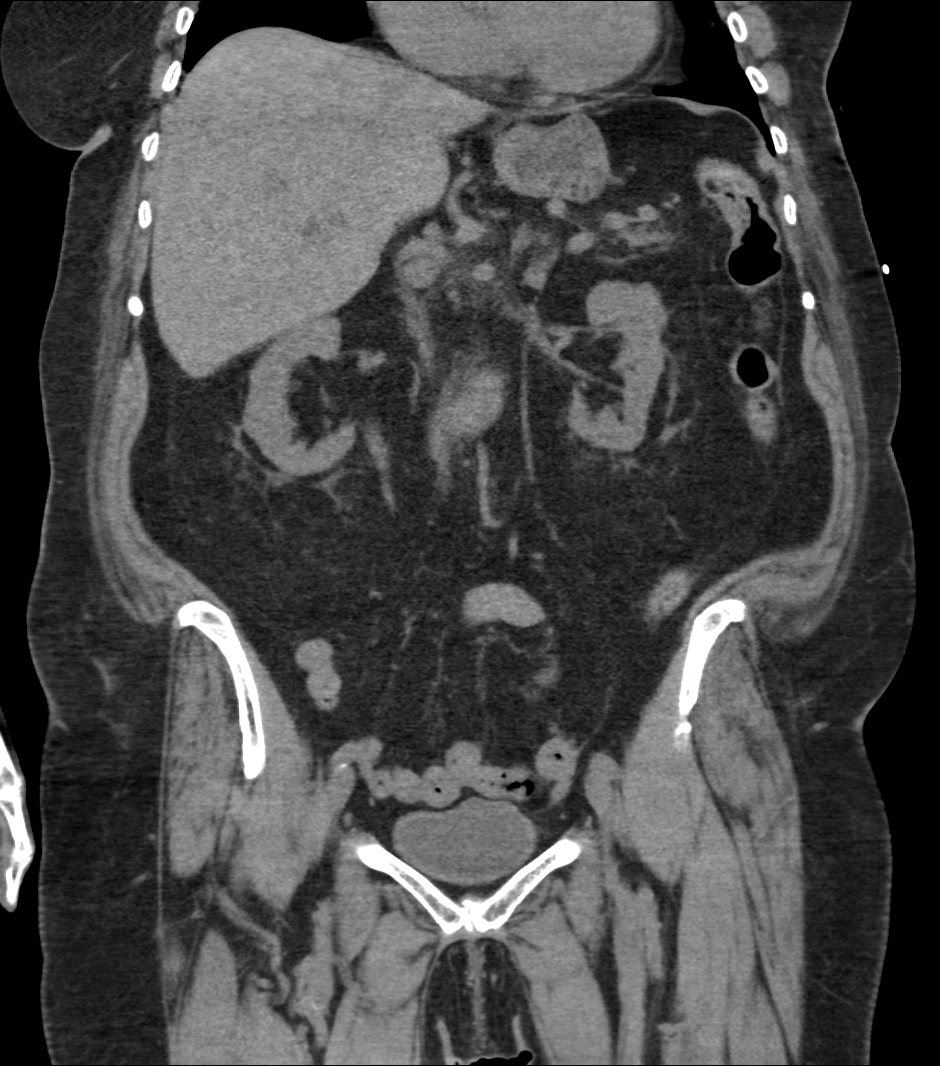
[im 66/119  soft-tissue]
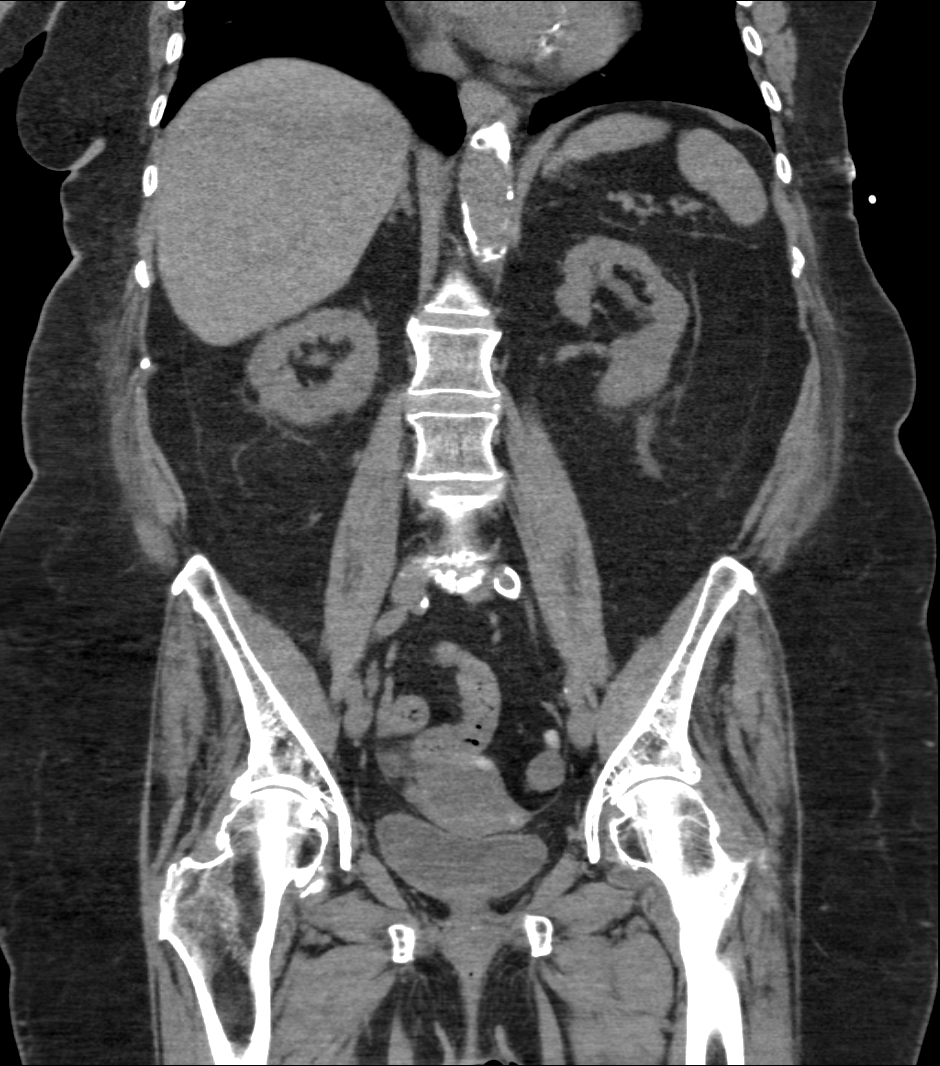

[9 of 46 positions shown; findings below may reference images not displayed]

FINDINGS: Mild atelectatic changes noted within the visualized lung bases.
There is mild cardiomegaly. No pleural pericardial effusion.

Limited noncontrast evaluation of the liver is unremarkable.
Gallbladder within normal limits. Spleen and adrenal glands are
unremarkable.

There is ill definition of the pancreas with prominent
peripancreatic fat stranding, suggestive of acute pancreatitis. No
loculated fluid collections are identified. Evaluation for
pancreatic necrosis limited on this noncontrast examination.

Kidneys within normal limits without evidence of nephrolithiasis or
hydronephrosis. Cysts

Small hiatal hernia noted. Mild wall thickening with perienteric fat
stranding about the duodenum present, felt to be reactive in nature
to the adjacent inflammatory changes within the pancreas. No
evidence of bowel obstruction. No other inflammatory changes seen
about the bowels. Scattered sigmoid diverticulosis present without
acute diverticulitis. Appendix is normal.

Bladder within normal limits. Small 9 mm hypodense nodular density
noted anterior to the bladder (series 201, image 77). Finding is of
of uncertain etiology, but of doubtful clinical significance.

Calcified fibroid present within the uterus. Ovaries within normal
limits for patient age.

No free air or fluid. A few shotty peripancreatic lymph nodes noted,
likely reactive. No pathologically enlarged lymph nodes seen within
the abdomen and pelvis.

Extensive atherosclerotic calcifications present throughout the
intra-abdominal aorta. There is no associated aneurysm.

No acute osseous abnormality. No worrisome lytic or blastic osseous
lesions. Prominent degenerative changes present at L4-5 and L5-S1.
There is trace chronic anterolisthesis of L3 and L4.
IMPRESSION: 1. Findings consistent with acute pancreatitis. No complicating
features identified on this non contrast examination.
2. Extensive atheromatous disease throughout the intra-abdominal
aorta without associated aneurysm.
3. Sigmoid diverticulosis without acute diverticulitis.
4. Small hiatal hernia.

## 2015-11-13 ENCOUNTER — Other Ambulatory Visit: Payer: Self-pay | Admitting: *Deleted

## 2015-11-13 DIAGNOSIS — I10 Essential (primary) hypertension: Secondary | ICD-10-CM

## 2015-11-13 DIAGNOSIS — Z01810 Encounter for preprocedural cardiovascular examination: Secondary | ICD-10-CM

## 2015-11-13 MED ORDER — METOPROLOL SUCCINATE ER 50 MG PO TB24: ORAL_TABLET | ORAL | Status: DC

## 2015-11-30 DIAGNOSIS — Z1231 Encounter for screening mammogram for malignant neoplasm of breast: Secondary | ICD-10-CM | POA: Diagnosis not present

## 2015-12-05 DIAGNOSIS — M1711 Unilateral primary osteoarthritis, right knee: Secondary | ICD-10-CM | POA: Diagnosis not present

## 2015-12-05 DIAGNOSIS — M25562 Pain in left knee: Secondary | ICD-10-CM | POA: Diagnosis not present

## 2015-12-06 ENCOUNTER — Encounter (INDEPENDENT_AMBULATORY_CARE_PROVIDER_SITE_OTHER): Payer: Medicare Other | Admitting: Ophthalmology

## 2015-12-06 DIAGNOSIS — I10 Essential (primary) hypertension: Secondary | ICD-10-CM | POA: Diagnosis not present

## 2015-12-06 DIAGNOSIS — H43813 Vitreous degeneration, bilateral: Secondary | ICD-10-CM

## 2015-12-06 DIAGNOSIS — H35033 Hypertensive retinopathy, bilateral: Secondary | ICD-10-CM

## 2015-12-06 DIAGNOSIS — H353132 Nonexudative age-related macular degeneration, bilateral, intermediate dry stage: Secondary | ICD-10-CM | POA: Diagnosis not present

## 2016-01-01 ENCOUNTER — Other Ambulatory Visit: Payer: Self-pay | Admitting: Cardiology

## 2016-01-01 DIAGNOSIS — Z01810 Encounter for preprocedural cardiovascular examination: Secondary | ICD-10-CM

## 2016-01-01 DIAGNOSIS — I10 Essential (primary) hypertension: Secondary | ICD-10-CM

## 2016-01-01 MED ORDER — DOXAZOSIN MESYLATE 2 MG PO TABS: 2.0000 mg | ORAL_TABLET | Freq: Every day | ORAL | Status: DC

## 2016-01-01 MED ORDER — LISINOPRIL 40 MG PO TABS: 40.0000 mg | ORAL_TABLET | Freq: Every day | ORAL | Status: DC

## 2016-01-01 NOTE — Telephone Encounter (Signed)
°*  STAT* If patient is at the pharmacy, call can be transferred to refill team.   1. Which medications need to be refilled? (please list name of each medication and dose if known) Doxazosin 2mg  , Lisinopril 40mg  ( needs new prescriptions sent, Patient changed pharmacy)   2. Which pharmacy/location (including street and city if local pharmacy) is medication to be sent to?[facility]   3. Do they need a 30 day or 90 day supply? [place]

## 2016-01-01 NOTE — Telephone Encounter (Signed)
Rx(s) sent to pharmacy electronically. Patient should get future refills from PCP - per last MD note from 05/2015 - was to follow up PRN

## 2016-01-03 ENCOUNTER — Encounter (INDEPENDENT_AMBULATORY_CARE_PROVIDER_SITE_OTHER): Payer: Medicare Other | Admitting: Ophthalmology

## 2016-01-03 DIAGNOSIS — H43813 Vitreous degeneration, bilateral: Secondary | ICD-10-CM | POA: Diagnosis not present

## 2016-01-03 DIAGNOSIS — I10 Essential (primary) hypertension: Secondary | ICD-10-CM | POA: Diagnosis not present

## 2016-01-03 DIAGNOSIS — H353132 Nonexudative age-related macular degeneration, bilateral, intermediate dry stage: Secondary | ICD-10-CM | POA: Diagnosis not present

## 2016-01-03 DIAGNOSIS — H35033 Hypertensive retinopathy, bilateral: Secondary | ICD-10-CM

## 2016-01-16 ENCOUNTER — Other Ambulatory Visit: Payer: Self-pay | Admitting: Orthopedic Surgery

## 2016-01-25 ENCOUNTER — Encounter (HOSPITAL_COMMUNITY): Payer: Self-pay

## 2016-01-25 ENCOUNTER — Encounter (HOSPITAL_COMMUNITY)
Admission: RE | Admit: 2016-01-25 | Discharge: 2016-01-25 | Disposition: A | Discharge status: Home / Self Care | Payer: Medicare Other | Source: Ambulatory Visit | Attending: Orthopedic Surgery | Admitting: Orthopedic Surgery

## 2016-01-25 DIAGNOSIS — Z79899 Other long term (current) drug therapy: Secondary | ICD-10-CM | POA: Insufficient documentation

## 2016-01-25 DIAGNOSIS — I10 Essential (primary) hypertension: Secondary | ICD-10-CM | POA: Diagnosis not present

## 2016-01-25 DIAGNOSIS — Z01818 Encounter for other preprocedural examination: Secondary | ICD-10-CM | POA: Insufficient documentation

## 2016-01-25 DIAGNOSIS — Z96611 Presence of right artificial shoulder joint: Secondary | ICD-10-CM | POA: Diagnosis not present

## 2016-01-25 DIAGNOSIS — E785 Hyperlipidemia, unspecified: Secondary | ICD-10-CM | POA: Insufficient documentation

## 2016-01-25 DIAGNOSIS — M1711 Unilateral primary osteoarthritis, right knee: Secondary | ICD-10-CM | POA: Diagnosis not present

## 2016-01-25 DIAGNOSIS — Z01812 Encounter for preprocedural laboratory examination: Secondary | ICD-10-CM | POA: Insufficient documentation

## 2016-01-25 DIAGNOSIS — Z7982 Long term (current) use of aspirin: Secondary | ICD-10-CM | POA: Insufficient documentation

## 2016-01-25 DIAGNOSIS — E039 Hypothyroidism, unspecified: Secondary | ICD-10-CM | POA: Insufficient documentation

## 2016-01-25 HISTORY — DX: Stress incontinence (female) (male): N39.3

## 2016-01-25 LAB — SURGICAL PCR SCREEN
MRSA, PCR: NEGATIVE
STAPHYLOCOCCUS AUREUS: NEGATIVE

## 2016-01-25 LAB — BASIC METABOLIC PANEL
Anion gap: 7 (ref 5–15)
BUN: 34 mg/dL — ABNORMAL HIGH (ref 6–20)
CALCIUM: 10.5 mg/dL — AB (ref 8.9–10.3)
CHLORIDE: 106 mmol/L (ref 101–111)
CO2: 26 mmol/L (ref 22–32)
CREATININE: 1.15 mg/dL — AB (ref 0.44–1.00)
GFR calc non Af Amer: 41 mL/min — ABNORMAL LOW (ref >60)
GFR, EST AFRICAN AMERICAN: 48 mL/min — AB (ref >60)
Glucose, Bld: 95 mg/dL (ref 65–99)
Potassium: 4.1 mmol/L (ref 3.5–5.1)
Sodium: 139 mmol/L (ref 135–145)

## 2016-01-25 LAB — CBC
HEMATOCRIT: 40 % (ref 36.0–46.0)
HEMOGLOBIN: 12.7 g/dL (ref 12.0–15.0)
MCH: 30.2 pg (ref 26.0–34.0)
MCHC: 31.8 g/dL (ref 30.0–36.0)
MCV: 95 fL (ref 78.0–100.0)
Platelets: 198 10*3/uL (ref 150–400)
RBC: 4.21 MIL/uL (ref 3.87–5.11)
RDW: 12.5 % (ref 11.5–15.5)
WBC: 8.3 10*3/uL (ref 4.0–10.5)

## 2016-01-25 NOTE — Pre-Procedure Instructions (Signed)
Carla Taylor  01/25/2016      [facility] SERVICE - [place], [place] [place] [address] Phone: [phone] Fax: [phone]    Your procedure is scheduled on 02/05/16  Report to [facility] Admitting at 530 A.M.  Call this number if you have problems the morning of surgery:  [phone]   Remember:  Do not eat food or drink liquids after midnight.  Take these medicines the morning of surgery with A SIP OF WATER amlodipine(norvasc),doxazosin(cardura),levothyroxine,metoprolol  STOP all herbel meds, nsaids (aleve,naproxen,advil,ibuprofen) 5 days prior to surgery starting 01/31/16 including  All vitamins,aspirin   Do not wear jewelry, make-up or nail polish.  Do not wear lotions, powders, or perfumes.  You may wear deoderant.  Do not shave 48 hours prior to surgery.  Men may shave face and neck.  Do not bring valuables to the hospital.  [facility] is not responsible for any belongings or valuables.  Contacts, dentures or bridgework may not be worn into surgery.  Leave your suitcase in the car.  After surgery it may be brought to your room.  For patients admitted to the hospital, discharge time will be determined by your treatment team.  Patients discharged the day of surgery will not be allowed to drive home.   Name and phone number of your driver:    Special instructions:   Special Instructions: [facility] - Preparing for Surgery  Before surgery, you can play an important role.  Because skin is not sterile, your skin needs to be as free of germs as possible.  You can reduce the number of germs on you skin by washing with CHG (chlorahexidine gluconate) soap before surgery.  CHG is an antiseptic cleaner which kills germs and bonds with the skin to continue killing germs even after washing.  Please DO NOT use if you have an allergy to CHG or antibacterial soaps.  If your skin becomes reddened/irritated stop using  the CHG and inform your nurse when you arrive at Short Stay.  Do not shave (including legs and underarms) for at least 48 hours prior to the first CHG shower.  You may shave your face.  Please follow these instructions carefully:   1.  Shower with CHG Soap the night before surgery and the morning of Surgery.  2.  If you choose to wash your hair, wash your hair first as usual with your normal shampoo.  3.  After you shampoo, rinse your hair and body thoroughly to remove the Shampoo.  4.  Use CHG as you would any other liquid soap.  You can apply chg directly  to the skin and wash gently with scrungie or a clean washcloth.  5.  Apply the CHG Soap to your body ONLY FROM THE NECK DOWN.  Do not use on open wounds or open sores.  Avoid contact with your eyes ears, mouth and genitals (private parts).  Wash genitals (private parts)       with your normal soap.  6.  Wash thoroughly, paying special attention to the area where your surgery will be performed.  7.  Thoroughly rinse your body with warm water from the neck down.  8.  DO NOT shower/wash with your normal soap after using and rinsing off the CHG Soap.  9.  Pat yourself dry with a clean towel.            10.  Wear clean pajamas.  11.  Place clean sheets on your bed the night of your first shower and do not sleep with pets.  Day of Surgery  Do not apply any lotions/deodorants the morning of surgery.  Please wear clean clothes to the hospital/surgery center.  Please read over the following fact sheets that you were given. Pain Booklet, MRSA Information and Surgical Site Infection Prevention

## 2016-01-28 NOTE — Progress Notes (Signed)
Anesthesia Chart Review:  Pt is an 82 year old female scheduled for R total knee arthroplasty on 02/05/2016 with Marchia Bond, MD  PCP is Antony Contras, MD  PMH includes:  HTN, hyperlipidemia, hypothyroidism. Never smoker. BMI 34. S/p R shoulder arthroplasty 09/19/14.   Medications include: amlodipine, ASA, doxazosin, hctz, levothyroxine, lisinopril, metoprolol  Preoperative labs reviewed.    EKG 06/07/15: NSR. Nonspecific ST abnormality  Echo 06/06/14:  - Left ventricle: The cavity size was normal. There was mild concentric hypertrophy. Systolic function was vigorous. The estimated ejection fraction was in the range of 65% to 70%. Wall motion was normal; there were no regional wall motion abnormalities. Doppler parameters are consistent with abnormal left ventricular relaxation (grade 1 diastolic dysfunction). Doppler parameters are consistent with mildly elevated ventricular end-diastolic filling pressure. - Aortic valve: Mildly thickened, mildly calcified leaflets. Mean gradient (S): 5 mm Hg. Valve area (VTI): 2.47 cm^2. Valve area (Vmax): 2.37 cm^2. Valve area (Vmean): 2.44 cm^2. - Mitral valve: Calcified annulus. Mildly thickened leaflets. There was mild regurgitation. - Left atrium: The atrium was normal in size. - Right ventricle: Systolic function was normal. - Right atrium: The atrium was normal in size. - Atrial septum: No defect or patent foramen ovale was identified. - Tricuspid valve: There was mild regurgitation. - Pulmonic valve: There was no regurgitation. - Pulmonary arteries: Systolic pressure was mildly increased. PA peak pressure: 41 mm Hg (S). - Inferior vena cava: The vessel was normal in size. The respirophasic diameter changes were in the normal range (= 50%), consistent with normal central venous pressure. - Pericardium, extracardiac: There was no pericardial effusion.  Nuclear stress test 06/06/14: Normal stress nuclear study. LV Wall Motion: NL LV Function; NL  Wall Motion; EF 71%  If no changes, I anticipate pt can proceed with surgery as scheduled.   Willeen Cass, FNP-BC [facility]/Anesthesiology Phone: [phone] 01/28/2016 11:10 AM

## 2016-02-04 ENCOUNTER — Encounter (HOSPITAL_COMMUNITY): Payer: Self-pay | Admitting: Anesthesiology

## 2016-02-04 NOTE — Anesthesia Preprocedure Evaluation (Addendum)
Anesthesia Evaluation  Patient identified by MRN, date of birth, ID band Patient awake    Reviewed: Allergy & Precautions, NPO status , Patient's Chart, lab work & pertinent test results  Airway Mallampati: III  TM Distance: >3 FB Neck ROM: Full    Dental no notable dental hx. (+) Upper Dentures   Pulmonary shortness of breath and with exertion,    Pulmonary exam normal breath sounds clear to auscultation       Cardiovascular hypertension, Pt. on medications Normal cardiovascular exam Rhythm:Regular Rate:Normal     Neuro/Psych negative neurological ROS  negative psych ROS   GI/Hepatic Neg liver ROS, Hx/o Pancreatitis   Endo/Other  Hypothyroidism Obesity Hyperlipidemia   Renal/GU Renal InsufficiencyRenal diseasenegative Renal ROS Bladder dysfunction  SUI    Musculoskeletal  (+) Arthritis , Osteoarthritis,    Abdominal (+) + obese,   Peds  Hematology negative hematology ROS (+)   Anesthesia Other Findings   Reproductive/Obstetrics negative OB ROS                          Anesthesia Physical Anesthesia Plan  ASA: III  Anesthesia Plan: Spinal   Post-op Pain Management: GA combined w/ Regional for post-op pain   Induction: Intravenous  Airway Management Planned:   Additional Equipment:   Intra-op Plan:   Post-operative Plan:   Informed Consent: I have reviewed the patients History and Physical, chart, labs and discussed the procedure including the risks, benefits and alternatives for the proposed anesthesia with the patient or authorized representative who has indicated his/her understanding and acceptance.   Dental advisory given  Plan Discussed with: Anesthesiologist, CRNA and Surgeon  Anesthesia Plan Comments:        Anesthesia Quick Evaluation

## 2016-02-05 ENCOUNTER — Inpatient Hospital Stay (HOSPITAL_COMMUNITY): Payer: Medicare Other | Admitting: Emergency Medicine

## 2016-02-05 ENCOUNTER — Inpatient Hospital Stay (HOSPITAL_COMMUNITY)
Admission: RE | Admit: 2016-02-05 | Discharge: 2016-02-07 | DRG: 470 | Disposition: A | Discharge status: Skilled Nursing Facility | Payer: Medicare Other | Source: Ambulatory Visit | Attending: Orthopedic Surgery | Admitting: Orthopedic Surgery

## 2016-02-05 ENCOUNTER — Inpatient Hospital Stay (HOSPITAL_COMMUNITY): Payer: Medicare Other

## 2016-02-05 ENCOUNTER — Inpatient Hospital Stay (HOSPITAL_COMMUNITY): Payer: Medicare Other | Admitting: Anesthesiology

## 2016-02-05 ENCOUNTER — Encounter (HOSPITAL_COMMUNITY): Payer: Self-pay | Admitting: Orthopedic Surgery

## 2016-02-05 ENCOUNTER — Encounter (HOSPITAL_COMMUNITY)
Admission: RE | Disposition: A | Discharge status: Skilled Nursing Facility | Payer: Self-pay | Source: Ambulatory Visit | Attending: Orthopedic Surgery

## 2016-02-05 DIAGNOSIS — Z7982 Long term (current) use of aspirin: Secondary | ICD-10-CM

## 2016-02-05 DIAGNOSIS — E785 Hyperlipidemia, unspecified: Secondary | ICD-10-CM | POA: Diagnosis not present

## 2016-02-05 DIAGNOSIS — Z9841 Cataract extraction status, right eye: Secondary | ICD-10-CM | POA: Diagnosis not present

## 2016-02-05 DIAGNOSIS — Z9842 Cataract extraction status, left eye: Secondary | ICD-10-CM | POA: Diagnosis not present

## 2016-02-05 DIAGNOSIS — M25561 Pain in right knee: Secondary | ICD-10-CM | POA: Diagnosis not present

## 2016-02-05 DIAGNOSIS — M1711 Unilateral primary osteoarthritis, right knee: Principal | ICD-10-CM | POA: Diagnosis present

## 2016-02-05 DIAGNOSIS — D62 Acute posthemorrhagic anemia: Secondary | ICD-10-CM | POA: Diagnosis not present

## 2016-02-05 DIAGNOSIS — Z96651 Presence of right artificial knee joint: Secondary | ICD-10-CM | POA: Diagnosis not present

## 2016-02-05 DIAGNOSIS — R2681 Unsteadiness on feet: Secondary | ICD-10-CM | POA: Diagnosis not present

## 2016-02-05 DIAGNOSIS — M179 Osteoarthritis of knee, unspecified: Secondary | ICD-10-CM | POA: Diagnosis not present

## 2016-02-05 DIAGNOSIS — E039 Hypothyroidism, unspecified: Secondary | ICD-10-CM | POA: Diagnosis not present

## 2016-02-05 DIAGNOSIS — Z96659 Presence of unspecified artificial knee joint: Secondary | ICD-10-CM | POA: Diagnosis not present

## 2016-02-05 DIAGNOSIS — R279 Unspecified lack of coordination: Secondary | ICD-10-CM | POA: Diagnosis not present

## 2016-02-05 DIAGNOSIS — I10 Essential (primary) hypertension: Secondary | ICD-10-CM | POA: Diagnosis not present

## 2016-02-05 DIAGNOSIS — Z4733 Aftercare following explantation of knee joint prosthesis: Secondary | ICD-10-CM | POA: Diagnosis not present

## 2016-02-05 DIAGNOSIS — Z471 Aftercare following joint replacement surgery: Secondary | ICD-10-CM | POA: Diagnosis not present

## 2016-02-05 DIAGNOSIS — R278 Other lack of coordination: Secondary | ICD-10-CM | POA: Diagnosis not present

## 2016-02-05 DIAGNOSIS — M6281 Muscle weakness (generalized): Secondary | ICD-10-CM | POA: Diagnosis not present

## 2016-02-05 DIAGNOSIS — H3530 Unspecified macular degeneration: Secondary | ICD-10-CM | POA: Diagnosis not present

## 2016-02-05 DIAGNOSIS — Z96611 Presence of right artificial shoulder joint: Secondary | ICD-10-CM | POA: Diagnosis present

## 2016-02-05 HISTORY — PX: TOTAL KNEE ARTHROPLASTY: SHX125

## 2016-02-05 HISTORY — DX: Unilateral primary osteoarthritis, right knee: M17.11

## 2016-02-05 SURGERY — ARTHROPLASTY, KNEE, TOTAL
Anesthesia: Spinal | Site: Knee | Laterality: Right

## 2016-02-05 MED ORDER — FENTANYL CITRATE (PF) 250 MCG/5ML IJ SOLN
INTRAMUSCULAR | Status: AC
  Filled 2016-02-05: qty 5

## 2016-02-05 MED ORDER — DEXAMETHASONE SODIUM PHOSPHATE 10 MG/ML IJ SOLN
INTRAMUSCULAR | Status: AC
  Filled 2016-02-05: qty 1

## 2016-02-05 MED ORDER — BACLOFEN 10 MG PO TABS: 10.0000 mg | ORAL_TABLET | Freq: Three times a day (TID) | ORAL | Status: DC

## 2016-02-05 MED ORDER — BUPIVACAINE HCL (PF) 0.25 % IJ SOLN
INTRAMUSCULAR | Status: DC | PRN
  Administered 2016-02-05: 30 mL

## 2016-02-05 MED ORDER — KETOROLAC TROMETHAMINE 15 MG/ML IJ SOLN
7.5000 mg | Freq: Four times a day (QID) | INTRAMUSCULAR | Status: AC
  Administered 2016-02-05 – 2016-02-06 (×4): 7.5 mg via INTRAVENOUS
  Filled 2016-02-05 (×3): qty 1

## 2016-02-05 MED ORDER — POLYVINYL ALCOHOL 1.4 % OP SOLN
1.0000 [drp] | OPHTHALMIC | Status: DC | PRN
  Filled 2016-02-05: qty 15

## 2016-02-05 MED ORDER — LISINOPRIL 40 MG PO TABS
40.0000 mg | ORAL_TABLET | Freq: Every day | ORAL | Status: DC
  Administered 2016-02-05 – 2016-02-07 (×4): 40 mg via ORAL
  Filled 2016-02-05 (×4): qty 1

## 2016-02-05 MED ORDER — LIDOCAINE 2% (20 MG/ML) 5 ML SYRINGE
INTRAMUSCULAR | Status: AC
  Filled 2016-02-05: qty 5

## 2016-02-05 MED ORDER — POLYETHYLENE GLYCOL 3350 17 G PO PACK: 17.0000 g | PACK | Freq: Every day | ORAL | Status: DC | PRN

## 2016-02-05 MED ORDER — BISACODYL 10 MG RE SUPP
10.0000 mg | Freq: Every day | RECTAL | Status: DC | PRN
Start: 2016-02-05 — End: 2016-02-07

## 2016-02-05 MED ORDER — 0.9 % SODIUM CHLORIDE (POUR BTL) OPTIME
TOPICAL | Status: DC | PRN
  Administered 2016-02-05: 1000 mL

## 2016-02-05 MED ORDER — HYDROCHLOROTHIAZIDE 25 MG PO TABS
25.0000 mg | ORAL_TABLET | Freq: Every day | ORAL | Status: DC
Start: 2016-02-05 — End: 2016-02-07
  Administered 2016-02-05 – 2016-02-07 (×4): 25 mg via ORAL
  Filled 2016-02-05 (×4): qty 1

## 2016-02-05 MED ORDER — SENNA 8.6 MG PO TABS
1.0000 | ORAL_TABLET | Freq: Two times a day (BID) | ORAL | Status: DC
  Administered 2016-02-05 – 2016-02-07 (×5): 8.6 mg via ORAL
  Filled 2016-02-05 (×5): qty 1

## 2016-02-05 MED ORDER — HYDROCODONE-ACETAMINOPHEN 10-325 MG PO TABS
1.0000 | ORAL_TABLET | ORAL | Status: DC | PRN
  Administered 2016-02-05 – 2016-02-07 (×10): 1 via ORAL
  Filled 2016-02-05 (×10): qty 1

## 2016-02-05 MED ORDER — SODIUM CHLORIDE 0.9 % IR SOLN
Status: DC | PRN
  Administered 2016-02-05: 1000 mL

## 2016-02-05 MED ORDER — METOCLOPRAMIDE HCL 5 MG/ML IJ SOLN: 5.0000 mg | Freq: Three times a day (TID) | INTRAMUSCULAR | Status: DC | PRN

## 2016-02-05 MED ORDER — BUPIVACAINE HCL (PF) 0.25 % IJ SOLN
INTRAMUSCULAR | Status: AC
  Filled 2016-02-05: qty 30

## 2016-02-05 MED ORDER — DIPHENHYDRAMINE HCL 12.5 MG/5ML PO ELIX: 12.5000 mg | ORAL_SOLUTION | ORAL | Status: DC | PRN

## 2016-02-05 MED ORDER — ONDANSETRON HCL 4 MG/2ML IJ SOLN
INTRAMUSCULAR | Status: AC
  Filled 2016-02-05: qty 2

## 2016-02-05 MED ORDER — HYDROMORPHONE HCL 1 MG/ML IJ SOLN: 0.5000 mg | INTRAMUSCULAR | Status: DC | PRN

## 2016-02-05 MED ORDER — ONDANSETRON HCL 4 MG PO TABS: 4.0000 mg | ORAL_TABLET | Freq: Four times a day (QID) | ORAL | Status: DC | PRN

## 2016-02-05 MED ORDER — CEFAZOLIN SODIUM-DEXTROSE 2-4 GM/100ML-% IV SOLN
2.0000 g | Freq: Four times a day (QID) | INTRAVENOUS | Status: AC
  Administered 2016-02-05 (×2): 2 g via INTRAVENOUS
  Filled 2016-02-05 (×2): qty 100

## 2016-02-05 MED ORDER — LACTATED RINGERS IV SOLN
INTRAVENOUS | Status: DC | PRN
  Administered 2016-02-05: 07:00:00 via INTRAVENOUS

## 2016-02-05 MED ORDER — PROSIGHT PO TABS
1.0000 | ORAL_TABLET | Freq: Two times a day (BID) | ORAL | Status: DC
  Administered 2016-02-05 – 2016-02-07 (×4): 1 via ORAL
  Filled 2016-02-05 (×5): qty 1

## 2016-02-05 MED ORDER — ONDANSETRON HCL 4 MG/2ML IJ SOLN
4.0000 mg | Freq: Four times a day (QID) | INTRAMUSCULAR | Status: DC | PRN
  Administered 2016-02-05: 4 mg via INTRAVENOUS
  Filled 2016-02-05: qty 2

## 2016-02-05 MED ORDER — CEFAZOLIN SODIUM-DEXTROSE 2-4 GM/100ML-% IV SOLN
2.0000 g | INTRAVENOUS | Status: AC
  Administered 2016-02-05: 2 g via INTRAVENOUS

## 2016-02-05 MED ORDER — MAGNESIUM CITRATE PO SOLN: 1.0000 | Freq: Once | ORAL | Status: DC | PRN

## 2016-02-05 MED ORDER — DOXAZOSIN MESYLATE 2 MG PO TABS
2.0000 mg | ORAL_TABLET | Freq: Every day | ORAL | Status: DC
  Administered 2016-02-06 – 2016-02-07 (×2): 2 mg via ORAL
  Filled 2016-02-05 (×2): qty 1

## 2016-02-05 MED ORDER — PROPOFOL 10 MG/ML IV BOLUS
INTRAVENOUS | Status: AC
  Filled 2016-02-05: qty 40

## 2016-02-05 MED ORDER — ACETAMINOPHEN 650 MG RE SUPP: 650.0000 mg | Freq: Four times a day (QID) | RECTAL | Status: DC | PRN

## 2016-02-05 MED ORDER — ALUM & MAG HYDROXIDE-SIMETH 200-200-20 MG/5ML PO SUSP: 30.0000 mL | ORAL | Status: DC | PRN

## 2016-02-05 MED ORDER — METOCLOPRAMIDE HCL 5 MG PO TABS
5.0000 mg | ORAL_TABLET | Freq: Three times a day (TID) | ORAL | Status: DC | PRN
Start: 2016-02-05 — End: 2016-02-07

## 2016-02-05 MED ORDER — CEFAZOLIN SODIUM-DEXTROSE 2-4 GM/100ML-% IV SOLN
INTRAVENOUS | Status: AC
  Filled 2016-02-05: qty 100

## 2016-02-05 MED ORDER — SENNA-DOCUSATE SODIUM 8.6-50 MG PO TABS: 2.0000 | ORAL_TABLET | Freq: Every day | ORAL | Status: DC

## 2016-02-05 MED ORDER — LIDOCAINE HCL (CARDIAC) 20 MG/ML IV SOLN
INTRAVENOUS | Status: DC | PRN
  Administered 2016-02-05: 50 mg via INTRAVENOUS

## 2016-02-05 MED ORDER — PROPOFOL 10 MG/ML IV BOLUS
INTRAVENOUS | Status: AC
  Filled 2016-02-05: qty 20

## 2016-02-05 MED ORDER — ACETAMINOPHEN 325 MG PO TABS: 650.0000 mg | ORAL_TABLET | Freq: Four times a day (QID) | ORAL | Status: DC | PRN

## 2016-02-05 MED ORDER — FENTANYL CITRATE (PF) 100 MCG/2ML IJ SOLN
INTRAMUSCULAR | Status: DC | PRN
  Administered 2016-02-05 (×2): 25 ug via INTRAVENOUS
  Administered 2016-02-05: 50 ug via INTRAVENOUS

## 2016-02-05 MED ORDER — LEVOTHYROXINE SODIUM 88 MCG PO TABS
88.0000 ug | ORAL_TABLET | Freq: Every day | ORAL | Status: DC
  Administered 2016-02-06 – 2016-02-07 (×2): 88 ug via ORAL
  Filled 2016-02-05 (×2): qty 1

## 2016-02-05 MED ORDER — MAGNESIUM HYDROXIDE 400 MG/5ML PO SUSP: 30.0000 mL | Freq: Every day | ORAL | Status: DC | PRN

## 2016-02-05 MED ORDER — RIVAROXABAN 10 MG PO TABS
10.0000 mg | ORAL_TABLET | Freq: Every day | ORAL | Status: DC
  Administered 2016-02-06 – 2016-02-07 (×2): 10 mg via ORAL
  Filled 2016-02-05 (×2): qty 1

## 2016-02-05 MED ORDER — RIVAROXABAN 10 MG PO TABS: 10.0000 mg | ORAL_TABLET | Freq: Every day | ORAL | Status: DC

## 2016-02-05 MED ORDER — DEXAMETHASONE SODIUM PHOSPHATE 10 MG/ML IJ SOLN
10.0000 mg | Freq: Once | INTRAMUSCULAR | Status: AC
  Administered 2016-02-06: 10 mg via INTRAVENOUS
  Filled 2016-02-05 (×2): qty 1

## 2016-02-05 MED ORDER — CALCIUM CARBONATE 1250 (500 CA) MG PO TABS
1250.0000 mg | ORAL_TABLET | Freq: Two times a day (BID) | ORAL | Status: DC
Start: 2016-02-05 — End: 2016-02-07
  Administered 2016-02-05 – 2016-02-07 (×4): 1250 mg via ORAL
  Filled 2016-02-05 (×4): qty 1

## 2016-02-05 MED ORDER — BUPIVACAINE IN DEXTROSE 0.75-8.25 % IT SOLN
INTRATHECAL | Status: DC | PRN
  Administered 2016-02-05: 2 mL via INTRATHECAL

## 2016-02-05 MED ORDER — MENTHOL 3 MG MT LOZG: 1.0000 | LOZENGE | OROMUCOSAL | Status: DC | PRN

## 2016-02-05 MED ORDER — HYDROCODONE-ACETAMINOPHEN 10-325 MG PO TABS: 1.0000 | ORAL_TABLET | Freq: Four times a day (QID) | ORAL | Status: DC | PRN

## 2016-02-05 MED ORDER — PHENOL 1.4 % MT LIQD: 1.0000 | OROMUCOSAL | Status: DC | PRN

## 2016-02-05 MED ORDER — PROPOFOL 500 MG/50ML IV EMUL
INTRAVENOUS | Status: DC | PRN
  Administered 2016-02-05: 25 ug/kg/min via INTRAVENOUS

## 2016-02-05 MED ORDER — KETOROLAC TROMETHAMINE 15 MG/ML IJ SOLN
INTRAMUSCULAR | Status: AC
  Filled 2016-02-05: qty 1

## 2016-02-05 MED ORDER — POLYETHYL GLYCOL-PROPYL GLYCOL 0.4-0.3 % OP SOLN: 1.0000 [drp] | OPHTHALMIC | Status: DC | PRN

## 2016-02-05 MED ORDER — METOCLOPRAMIDE HCL 5 MG/ML IJ SOLN: 10.0000 mg | Freq: Once | INTRAMUSCULAR | Status: DC | PRN

## 2016-02-05 MED ORDER — FENTANYL CITRATE (PF) 100 MCG/2ML IJ SOLN
25.0000 ug | INTRAMUSCULAR | Status: DC | PRN
  Administered 2016-02-05 (×2): 50 ug via INTRAVENOUS

## 2016-02-05 MED ORDER — POTASSIUM CHLORIDE IN NACL 20-0.45 MEQ/L-% IV SOLN
INTRAVENOUS | Status: DC
  Administered 2016-02-05: 75 mL/h via INTRAVENOUS
  Filled 2016-02-05 (×7): qty 1000

## 2016-02-05 MED ORDER — CYANOCOBALAMIN 500 MCG PO TABS
500.0000 ug | ORAL_TABLET | Freq: Every day | ORAL | Status: DC
  Administered 2016-02-06: 500 ug via ORAL
  Filled 2016-02-05 (×2): qty 1

## 2016-02-05 MED ORDER — DOCUSATE SODIUM 100 MG PO CAPS
100.0000 mg | ORAL_CAPSULE | Freq: Two times a day (BID) | ORAL | Status: DC
Start: 2016-02-05 — End: 2016-02-07
  Administered 2016-02-05 – 2016-02-07 (×4): 100 mg via ORAL
  Filled 2016-02-05 (×5): qty 1

## 2016-02-05 MED ORDER — METOPROLOL SUCCINATE ER 50 MG PO TB24
50.0000 mg | ORAL_TABLET | Freq: Every day | ORAL | Status: DC
  Administered 2016-02-06 – 2016-02-07 (×3): 50 mg via ORAL
  Filled 2016-02-05 (×4): qty 1

## 2016-02-05 MED ORDER — FENTANYL CITRATE (PF) 100 MCG/2ML IJ SOLN
INTRAMUSCULAR | Status: AC
  Filled 2016-02-05: qty 2

## 2016-02-05 MED ORDER — ICAPS AREDS FORMULA PO TABS: ORAL_TABLET | Freq: Two times a day (BID) | ORAL | Status: DC

## 2016-02-05 MED ORDER — METHOCARBAMOL 500 MG PO TABS
500.0000 mg | ORAL_TABLET | Freq: Four times a day (QID) | ORAL | Status: DC | PRN
  Administered 2016-02-05 – 2016-02-06 (×5): 500 mg via ORAL
  Filled 2016-02-05 (×5): qty 1

## 2016-02-05 MED ORDER — VITAMIN D (ERGOCALCIFEROL) 1.25 MG (50000 UNIT) PO CAPS: 50000.0000 [IU] | ORAL_CAPSULE | ORAL | Status: DC

## 2016-02-05 MED ORDER — METHOCARBAMOL 1000 MG/10ML IJ SOLN
500.0000 mg | Freq: Four times a day (QID) | INTRAVENOUS | Status: DC | PRN
  Filled 2016-02-05: qty 5

## 2016-02-05 MED ORDER — AMLODIPINE BESYLATE 5 MG PO TABS
5.0000 mg | ORAL_TABLET | Freq: Every day | ORAL | Status: DC
  Administered 2016-02-06 – 2016-02-07 (×3): 5 mg via ORAL
  Filled 2016-02-05 (×4): qty 1

## 2016-02-05 MED ORDER — DEXAMETHASONE SODIUM PHOSPHATE 4 MG/ML IJ SOLN
INTRAMUSCULAR | Status: DC | PRN
  Administered 2016-02-05: 10 mg via INTRAVENOUS

## 2016-02-05 MED ORDER — ONDANSETRON HCL 4 MG/2ML IJ SOLN
INTRAMUSCULAR | Status: DC | PRN
  Administered 2016-02-05: 4 mg via INTRAVENOUS

## 2016-02-05 SURGICAL SUPPLY — 68 items
APL SKNCLS STERI-STRIP NONHPOA (GAUZE/BANDAGES/DRESSINGS) ×1
BANDAGE ELASTIC 6 VELCRO ST LF (GAUZE/BANDAGES/DRESSINGS) ×3 IMPLANT
BANDAGE ESMARK 6X9 LF (GAUZE/BANDAGES/DRESSINGS) ×1 IMPLANT
BENZOIN TINCTURE PRP APPL 2/3 (GAUZE/BANDAGES/DRESSINGS) ×2 IMPLANT
BLADE SAG 18X100X1.27 (BLADE) ×2 IMPLANT
BLADE SAW RECIP 87.9 MT (BLADE) ×2 IMPLANT
BLADE SAW SGTL 13X75X1.27 (BLADE) ×2 IMPLANT
BLADE SURG 10 STRL SS (BLADE) ×1 IMPLANT
BNDG CMPR 9X6 STRL LF SNTH (GAUZE/BANDAGES/DRESSINGS) ×1
BNDG ESMARK 6X9 LF (GAUZE/BANDAGES/DRESSINGS) ×2
BOOTCOVER CLEANROOM LRG (PROTECTIVE WEAR) ×4 IMPLANT
BOWL SMART MIX CTS (DISPOSABLE) ×2 IMPLANT
CAPT KNEE TOTAL 3 ×2 IMPLANT
CEMENT HV SMART SET (Cement) ×4 IMPLANT
CLSR STERI-STRIP ANTIMIC 1/2X4 (GAUZE/BANDAGES/DRESSINGS) ×2 IMPLANT
COVER SURGICAL LIGHT HANDLE (MISCELLANEOUS) ×2 IMPLANT
CUFF TOURNIQUET SINGLE 34IN LL (TOURNIQUET CUFF) ×2 IMPLANT
DRAPE EXTREMITY T 121X128X90 (DRAPE) ×2 IMPLANT
DRAPE U-SHAPE 47X51 STRL (DRAPES) ×2 IMPLANT
DRSG PAD ABDOMINAL 8X10 ST (GAUZE/BANDAGES/DRESSINGS) ×2 IMPLANT
DURAPREP 26ML APPLICATOR (WOUND CARE) ×3 IMPLANT
ELECT CAUTERY BLADE 6.4 (BLADE) ×2 IMPLANT
ELECT REM PT RETURN 9FT ADLT (ELECTROSURGICAL) ×2
ELECTRODE REM PT RTRN 9FT ADLT (ELECTROSURGICAL) ×1 IMPLANT
FACESHIELD STD STERILE (MASK) ×1 IMPLANT
GAUZE SPONGE 4X4 12PLY STRL (GAUZE/BANDAGES/DRESSINGS) ×2 IMPLANT
GLOVE BIOGEL PI IND STRL 8 (GLOVE) ×1 IMPLANT
GLOVE BIOGEL PI INDICATOR 8 (GLOVE) ×1
GLOVE BIOGEL PI ORTHO PRO SZ8 (GLOVE) ×1
GLOVE ORTHO TXT STRL SZ7.5 (GLOVE) ×2 IMPLANT
GLOVE PI ORTHO PRO STRL SZ8 (GLOVE) ×1 IMPLANT
GLOVE SURG ORTHO 8.0 STRL STRW (GLOVE) ×2 IMPLANT
GLOVE SURG SS PI 6.5 STRL IVOR (GLOVE) ×1 IMPLANT
GOWN STRL REUS W/ TWL XL LVL3 (GOWN DISPOSABLE) ×1 IMPLANT
GOWN STRL REUS W/TWL 2XL LVL3 (GOWN DISPOSABLE) ×2 IMPLANT
GOWN STRL REUS W/TWL XL LVL3 (GOWN DISPOSABLE) ×2
HANDPIECE INTERPULSE COAX TIP (DISPOSABLE) ×2
HOOD PEEL AWAY FACE SHEILD DIS (HOOD) ×8 IMPLANT
IMMOBILIZER KNEE 22 (SOFTGOODS) ×2 IMPLANT
IMMOBILIZER KNEE 22 UNIV (SOFTGOODS) ×1 IMPLANT
KIT BASIN OR (CUSTOM PROCEDURE TRAY) ×2 IMPLANT
KIT ROOM TURNOVER OR (KITS) ×2 IMPLANT
MANIFOLD NEPTUNE II (INSTRUMENTS) ×2 IMPLANT
NDL 18GX1X1/2 (RX/OR ONLY) (NEEDLE) ×1 IMPLANT
NEEDLE 18GX1X1/2 (RX/OR ONLY) (NEEDLE) ×2 IMPLANT
NS IRRIG 1000ML POUR BTL (IV SOLUTION) ×2 IMPLANT
PACK TOTAL JOINT (CUSTOM PROCEDURE TRAY) ×2 IMPLANT
PAD ABD 8X10 STRL (GAUZE/BANDAGES/DRESSINGS) ×2 IMPLANT
PAD ARMBOARD 7.5X6 YLW CONV (MISCELLANEOUS) ×4 IMPLANT
PAD CAST 4YDX4 CTTN HI CHSV (CAST SUPPLIES) ×1 IMPLANT
PADDING CAST COTTON 4X4 STRL (CAST SUPPLIES) ×2
PADDING CAST COTTON 6X4 STRL (CAST SUPPLIES) ×2 IMPLANT
SET HNDPC FAN SPRY TIP SCT (DISPOSABLE) ×1 IMPLANT
SPONGE GAUZE 4X4 12PLY STER LF (GAUZE/BANDAGES/DRESSINGS) ×1 IMPLANT
SUCTION FRAZIER HANDLE 10FR (MISCELLANEOUS) ×1
SUCTION TUBE FRAZIER 10FR DISP (MISCELLANEOUS) ×1 IMPLANT
SUT MNCRL AB 4-0 PS2 18 (SUTURE) IMPLANT
SUT VIC AB 0 CT1 27 (SUTURE) ×2
SUT VIC AB 0 CT1 27XBRD ANBCTR (SUTURE) ×1 IMPLANT
SUT VIC AB 2-0 CT1 27 (SUTURE) ×2
SUT VIC AB 2-0 CT1 TAPERPNT 27 (SUTURE) ×1 IMPLANT
SUT VIC AB 3-0 SH 8-18 (SUTURE) ×4 IMPLANT
SYR 30ML LL (SYRINGE) IMPLANT
SYR 50ML LL SCALE MARK (SYRINGE) ×2 IMPLANT
TOWEL OR 17X24 6PK STRL BLUE (TOWEL DISPOSABLE) ×2 IMPLANT
TOWEL OR 17X26 10 PK STRL BLUE (TOWEL DISPOSABLE) ×2 IMPLANT
TRAY CATH 16FR W/PLASTIC CATH (SET/KITS/TRAYS/PACK) ×2 IMPLANT
WATER STERILE IRR 1000ML POUR (IV SOLUTION) ×4 IMPLANT

## 2016-02-05 NOTE — Op Note (Signed)
DATE OF SURGERY:  02/05/2016 TIME: 10:10 AM  PATIENT NAME:  Carla Taylor   AGE: 82 y.o.    PRE-OPERATIVE DIAGNOSIS:  RIGHT KNEE PRIMARY LOCALIZED OSTEOARTHRITIS  POST-OPERATIVE DIAGNOSIS:  Same  PROCEDURE:  Procedure(s): RIGHT TOTAL KNEE ARTHROPLASTY   SURGEON:  Johnny Bridge, MD   ASSISTANT:  Joya Gaskins, OPA-C, present and scrubbed throughout the case, critical for assistance with exposure, retraction, instrumentation, and closure.   OPERATIVE IMPLANTS: Biomet vanguard size 71 tibia, size 65 femur, size 10 poly, size 37 patella.   PREOPERATIVE INDICATIONS:  Carla Taylor is a 82 y.o. year old female with end stage bone on bone degenerative arthritis of the knee who failed conservative treatment, including injections, antiinflammatories, activity modification, and assistive devices, and had significant impairment of their activities of daily living, and elected for Total Knee Arthroplasty.   The risks, benefits, and alternatives were discussed at length including but not limited to the risks of infection, bleeding, nerve injury, stiffness, blood clots, the need for revision surgery, cardiopulmonary complications, among others, and they were willing to proceed.  OPERATIVE FINDINGS AND UNIQUE ASPECTS OF THE CASE:  Very tight knee with 20 degree flexion contracture, had to cut the femur and the tibia twice each.  Large loose bodies posteriorly x 4.  Had no thumb technique patellar tracking, cut the femur posteriorly with the jig at 4 ER, patella was very narrow, only 11 mm thickness of lateral facet before the cut.    EBL: 148ml  OPERATIVE DESCRIPTION:  The patient was brought to the operative room and placed in a supine position.  Spinal anesthesia was administered.  IV antibiotics were given.  The lower extremity was prepped and draped in the usual sterile fashion.  Time out was performed.  The leg was elevated and exsanguinated and the tourniquet was inflated.  Anterior  quadriceps tendon splitting approach was performed.  The patella was everted and osteophytes were removed.  The anterior horn of the medial and lateral meniscus was removed.   The distal femur was opened with the drill and the intramedullary distal femoral cutting jig was utilized, set at 5 degrees resecting 10 mm off the distal femur.  Care was taken to protect the collateral ligaments.  Then the extramedullary tibial cutting jig was utilized making the appropriate cut using the anterior tibial crest as a reference building in appropriate posterior slope.  Care was taken during the cut to protect the medial and collateral ligaments.  The proximal tibia was removed along with the posterior horns of the menisci.  The PCL was sacrificed.    The extensor gap was very tight and I recut the femur and the tibia and then it was measured and was approximately 23mm.    The distal femoral sizing jig was applied, taking care to avoid notching.  Then the 4-in-1 cutting jig was applied and the anterior and posterior femur was cut, along with the chamfer cuts.  All posterior osteophytes were removed.  The flexion gap was then measured and was symmetric with the extension gap.  I completed the distal femoral preparation using the appropriate jig to prepare the box.  Posterior loose bodies removed.   The patella was then measured, and cut with the saw.  The thickness before the cut was 22 and after the cut was 13, although it thinned out towards the lateral facet given preexisting wear.  The proximal tibia sized and prepared accordingly with the reamer and the punch, and then all components  were trialed with the 76mm poly insert.  The knee was found to have excellent balance and full motion.    The above named components were then cemented into place and all excess cement was removed.  The real polyethylene implant was placed.  After the cement had cured I released the tourniquet and confirmed excellent hemostasis  with no major posterior vessel injury.    The knee was easily taken through a range of motion and the patella tracked well and the knee irrigated copiously and the parapatellar and subcutaneous tissue closed with vicryl, and monocryl with steri strips for the skin.  The wounds were injected with marcaine, and dressed with sterile gauze and the patient was awakened and returned to the PACU in stable and satisfactory condition.  There were no complications.  Total tourniquet time was 110 minutes.

## 2016-02-05 NOTE — H&P (Signed)
PREOPERATIVE H&P  Chief Complaint: DJD RIGHT KNEE  HPI: Carla Taylor is a 82 y.o. female who presents for preoperative history and physical with a diagnosis of DJD RIGHT KNEE. Symptoms are rated as moderate to severe, and have been worsening.  This is significantly impairing activities of daily living.  She has elected for surgical management.   She has failed injections, activity modification, anti-inflammatories, and assistive devices.  Preoperative X-rays demonstrate end stage degenerative changes with osteophyte formation, loss of joint space, subchondral sclerosis.   Past Medical History  Diagnosis Date  . Arthritis   . Hypertension   . Chronic shoulder pain   . Hyperlipidemia   . Hypothyroid   . Pancreatitis   . Shortness of breath dyspnea     on exertion  . Macular degeneration   . 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 Stress bladder incontinence, female    Past Surgical History  Procedure Laterality Date  . No previous surgeries    . Carpal tunnel Bilateral   . Cataract extraction Bilateral     2005  . Knee arthroscopy Bilateral     2001  . Total shoulder arthroplasty Right 09/19/2014    Procedure: RIGHT TOTAL SHOULDER ARTHROPLASTY;  Surgeon: Johnny Bridge, MD;  Location: [place];  Service: Orthopedics;  Laterality: Right;  . Eye surgery     Social History   Social History  . Marital Status: Widowed    Spouse Name: N/A  . Number of Children: 4  . Years of Education: N/A   Occupational History  .  [employer]   Social History Main Topics  . Smoking status: Never Smoker   . Smokeless tobacco: None  . Alcohol Use: No  . Drug Use: No  . Sexual Activity: No   Other Topics Concern  . None   Social History Narrative   Family History  Problem Relation Age of Onset  . CVA Mother   . CVA Father    Allergies  Allergen Reactions  . No Known Allergies    Prior to Admission medications   Medication Sig Start Date End Date Taking?  Authorizing Provider  amLODipine (NORVASC) 5 MG tablet Take 5 mg by mouth daily.  12/27/15  Yes Historical Provider, MD  aspirin EC 81 MG tablet Take 81 mg by mouth daily.   Yes Historical Provider, MD  calcium carbonate (OS-CAL - DOSED IN MG OF ELEMENTAL CALCIUM) 1250 MG tablet Take 1,250 mg by mouth 2 (two) times daily with a meal.    Yes Historical Provider, MD  doxazosin (CARDURA) 2 MG tablet Take 1 tablet (2 mg total) by mouth daily. PLEASE OBTAIN FUTURE REFILLS FROM PCP. 01/01/16  Yes Lelon Perla, MD  hydrochlorothiazide (HYDRODIURIL) 25 MG tablet Take 25 mg by mouth daily.  04/29/14  Yes Historical Provider, MD  levothyroxine (SYNTHROID, LEVOTHROID) 88 MCG tablet Take 88 mcg by mouth daily before breakfast.   Yes Historical Provider, MD  lisinopril (PRINIVIL,ZESTRIL) 40 MG tablet Take 1 tablet (40 mg total) by mouth daily. PLEASE OBTAIN FUTURE REFILLS FROM PCP. 01/01/16  Yes Lelon Perla, MD  magnesium hydroxide (MILK OF MAGNESIA) 400 MG/5ML suspension Take 30 mLs by mouth daily as needed for mild constipation.   Yes Historical Provider, MD  metoprolol succinate (TOPROL-XL) 50 MG 24 hr tablet TAKE ONE AND ONE HALF TABLETS ONCE DAILY 11/13/15  Yes Lelon Perla, MD  Multiple Vitamins-Minerals (ICAPS AREDS FORMULA PO) Take 1 tablet by mouth  2 (two) times daily.   Yes Historical Provider, MD  Polyethyl Glycol-Propyl Glycol (SYSTANE) 0.4-0.3 % SOLN Place 1 drop into both eyes as needed (for dry eyes).    Yes Historical Provider, MD  sennosides-docusate sodium (SENOKOT-S) 8.6-50 MG tablet Take 2 tablets by mouth daily. Patient taking differently: Take 2 tablets by mouth daily as needed for constipation.  09/19/14  Yes Marchia Bond, MD  vitamin B-12 (CYANOCOBALAMIN) 500 MCG tablet Take 500 mcg by mouth daily.   Yes Historical Provider, MD  Vitamin D, Ergocalciferol, (DRISDOL) 50000 UNITS CAPS capsule Take 50,000 Units by mouth every Sunday.   Yes Historical Provider, MD     Positive ROS:  All other systems have been reviewed and were otherwise negative with the exception of those mentioned in the HPI and as above.  Physical Exam: General: Alert, no acute distress Cardiovascular: No pedal edema Respiratory: No cyanosis, no use of accessory musculature GI: No organomegaly, abdomen is soft and non-tender Skin: No lesions in the area of chief complaint Neurologic: Sensation intact distally Psychiatric: Patient is competent for consent with normal mood and affect Lymphatic: No axillary or cervical lymphadenopathy  MUSCULOSKELETAL: valgus with crepitance and ROM 10-110 with painful arc.  Assessment: DJD RIGHT KNEE   Plan: Plan for Procedure(s): RIGHT TOTAL KNEE ARTHROPLASTY  The risks benefits and alternatives were discussed with the patient including but not limited to the risks of nonoperative treatment, versus surgical intervention including infection, bleeding, nerve injury,  blood clots, cardiopulmonary complications, morbidity, mortality, among others, and they were willing to proceed.   Johnny Bridge, MD Cell [phone]   02/05/2016 6:05 AM

## 2016-02-05 NOTE — Addendum Note (Signed)
Addendum  created 02/05/16 1142 by Josephine Igo, MD   Modules edited: Clinical Notes   Clinical Notes:  File: [id number]

## 2016-02-05 NOTE — Transfer of Care (Signed)
Immediate Anesthesia Transfer of Care Note  Patient: Carla Taylor  Procedure(s) Performed: Procedure(s): RIGHT TOTAL KNEE ARTHROPLASTY (Right)  Patient Location: PACU  Anesthesia Type:Spinal  Level of Consciousness: awake and sedated  Airway & Oxygen Therapy: Patient Spontanous Breathing and Patient connected to face mask oxygen  Post-op Assessment: Report given to RN and Post -op Vital signs reviewed and stable  Post vital signs: Reviewed and stable  Last Vitals:  Filed Vitals:   02/05/16 0559 02/05/16 0627  BP: 157/57   Pulse: 61   Temp:  36.7 C  Resp: 18     Last Pain: There were no vitals filed for this visit.    Patients Stated Pain Goal: 3 ([id number])  Complications: No apparent anesthesia complications

## 2016-02-05 NOTE — Evaluation (Signed)
Physical Therapy Evaluation Patient Details Name: EVALYSE TACKITT MRN: [id number] DOB: [birth year] Today's Date: 02/05/2016   History of Present Illness  Patient is a 82 y/o female with hx of Rt TSA, HTN, HLD, macular degeneration and SUI presents s/p right TKA.  Clinical Impression  Patient presents with pain, nausea, dizziness and post surgical deficits RLE s/p Rt TKA. Tolerated sitting EOB and standing with assist of 1 but transfers and gait deferred secondary to nausea, dizziness and pallor upon standing. Pt incontinent of urine during session so assisted nurse with wash up and changing sheets. Pt highly motivated to return to independence as she plans to do second knee in 3 months. Education on exercises, precautions etc. Will follow acutely to maximize independence and mobility prior to return home.    Follow Up Recommendations SNF    Equipment Recommendations  None recommended by PT    Recommendations for Other Services       Precautions / Restrictions Precautions Precautions: Fall;Knee Precaution Booklet Issued: No Precaution Comments: Reveiwed no pillow under knee and precautions. Required Braces or Orthoses: Knee Immobilizer - Right Restrictions Weight Bearing Restrictions: Yes RLE Weight Bearing: Weight bearing as tolerated      Mobility  Bed Mobility Overal bed mobility: Needs Assistance Bed Mobility: Supine to Sit;Sit to Supine;Rolling Rolling: Mod assist   Supine to sit: Mod assist;HOB elevated Sit to supine: Mod assist;HOB elevated   General bed mobility comments: Assist to bring RLE to EOB and to elevate trunk, use of rail. + dizziness. Assist to bring bLEs into bed to return to supine. Rolling to right/left Mod A to change sheets and bed.  Transfers Overall transfer level: Needs assistance Equipment used: Rolling walker (2 wheeled) Transfers: Sit to/from Stand Sit to Stand: Max assist         General transfer comment: Cues for hand placement and  technique. Increased time to stand. Able to get more than halfway up, and then pt became pallor, nauseous needing to sit. Felt sick on stomach.  Ambulation/Gait Ambulation/Gait assistance:  (Deferred secondaryt o nausea, dizziness, pallor.)              Stairs            Wheelchair Mobility    Modified Rankin (Stroke Patients Only)       Balance Overall balance assessment: Needs assistance Sitting-balance support: Feet supported;No upper extremity supported Sitting balance-Leahy Scale: Good     Standing balance support: During functional activity Standing balance-Leahy Scale: Poor Standing balance comment: Reilant on BUEs and external support for balance. + dizziness, nausea in standing.                             Pertinent Vitals/Pain Pain Assessment: 0-10 Pain Score: 8  Pain Location: right knee Pain Descriptors / Indicators: Sore Pain Intervention(s): Monitored during session;Repositioned;Limited activity within patient's tolerance    Home Living Family/patient expects to be discharged to:: Skilled nursing facility ([facility]) Living Arrangements: Alone                    Prior Function Level of Independence: Independent         Comments: Very active, fully independent PTA and drives.      Hand Dominance   Dominant Hand: Right    Extremity/Trunk Assessment   Upper Extremity Assessment: Defer to OT evaluation           Lower Extremity Assessment: RLE deficits/detail  RLE Deficits / Details: Able to perform QS, limited AROM/strength 2/2 to post op/pain       Communication   Communication: HOH (bil hearing aids)  Cognition Arousal/Alertness: Awake/alert Behavior During Therapy: WFL for tasks assessed/performed Overall Cognitive Status: Within Functional Limits for tasks assessed                      General Comments General comments (skin integrity, edema, etc.): Daughter present during session. RN gave pt  medicine for nausea during session.     Exercises Total Joint Exercises Ankle Circles/Pumps: Both;10 reps;Supine Quad Sets: Both;10 reps;Supine Gluteal Sets: Both;10 reps;Supine      Assessment/Plan    PT Assessment Patient needs continued PT services  PT Diagnosis Difficulty walking;Generalized weakness;Acute pain   PT Problem List Decreased strength;Pain;Decreased range of motion;Decreased activity tolerance;Decreased balance;Decreased mobility  PT Treatment Interventions Balance training;Gait training;Functional mobility training;Therapeutic activities;Therapeutic exercise;Patient/family education;DME instruction   PT Goals (Current goals can be found in the Care Plan section) Acute Rehab PT Goals Patient Stated Goal: to get back to independence as fast as possible PT Goal Formulation: With patient Time For Goal Achievement: 02/19/16 Potential to Achieve Goals: Good    Frequency 7X/week   Barriers to discharge Decreased caregiver support lives alone    Co-evaluation               End of Session Equipment Utilized During Treatment: Gait belt;Right knee immobilizer Activity Tolerance: Treatment limited secondary to medical complications (Comment) (nausea, dizziness, sick on stomach) Patient left: in bed;with bed alarm set;with SCD's reapplied;with call bell/phone within reach;with family/visitor present Nurse Communication: Mobility status         Time: 1449-1534 PT Time Calculation (min) (ACUTE ONLY): 45 min   Charges:   PT Evaluation $PT Eval Moderate Complexity: 1 Procedure PT Treatments $Therapeutic Activity: 23-37 mins   PT G Codes:        Layla Kesling A Jaquala Fuller 02/05/2016, 3:49 PM Wray Kearns, [place], DPT [phone]

## 2016-02-05 NOTE — Anesthesia Procedure Notes (Signed)
Procedure Name: MAC Performed by: Ko Bardon W Pre-anesthesia Checklist: Patient identified, Emergency Drugs available, Suction available, Patient being monitored and Timeout performed Oxygen Delivery Method: Simple face mask Placement Confirmation: positive ETCO2       

## 2016-02-05 NOTE — Addendum Note (Signed)
Addendum  created 02/05/16 1141 by Josephine Igo, MD   Modules edited: Clinical Notes   Clinical Notes:  File: [id number]

## 2016-02-05 NOTE — Progress Notes (Signed)
Pt arrived to the unit from PACU via bed; IV intact and transfusing; pt oriented to the unit and room; call light within reach; family at bedside; pt compression wrap dsg remains clean, dry and intact; knee immobilizer remains on and intact; pt voices sensations to BLE; voided and VSS; fall/safety precaution and prevention education completed with pt and daughter at bedside; both voices understanding. Will continue to closely monitor pt. Delia Heady RN

## 2016-02-05 NOTE — Anesthesia Postprocedure Evaluation (Addendum)
Anesthesia Post Note  Patient: DESHUN LORMAN  Procedure(s) Performed: Procedure(s) (LRB): RIGHT TOTAL KNEE ARTHROPLASTY (Right)  Patient location during evaluation: PACU Anesthesia Type: Spinal Level of consciousness: awake and alert and oriented Pain management: pain level controlled Vital Signs Assessment: post-procedure vital signs reviewed and stable Respiratory status: spontaneous breathing, nonlabored ventilation, respiratory function stable and patient connected to nasal cannula oxygen Cardiovascular status: blood pressure returned to baseline and stable Postop Assessment: no signs of nausea or vomiting, no headache, no backache, spinal receding and patient able to bend at knees Anesthetic complications: no    Last Vitals:  Filed Vitals:   02/05/16 1115 02/05/16 1130  BP: 124/43 143/61  Pulse: 48 51  Temp:    Resp: 12 12    Last Pain:  Filed Vitals:   02/05/16 1134  PainSc: Asleep                 Keyle Doby A.

## 2016-02-06 ENCOUNTER — Encounter (HOSPITAL_COMMUNITY): Payer: Self-pay | Admitting: General Practice

## 2016-02-06 LAB — CBC
HEMATOCRIT: 30.4 % — AB (ref 36.0–46.0)
Hemoglobin: 9.8 g/dL — ABNORMAL LOW (ref 12.0–15.0)
MCH: 30 pg (ref 26.0–34.0)
MCHC: 32.2 g/dL (ref 30.0–36.0)
MCV: 93 fL (ref 78.0–100.0)
PLATELETS: 153 10*3/uL (ref 150–400)
RBC: 3.27 MIL/uL — ABNORMAL LOW (ref 3.87–5.11)
RDW: 12.4 % (ref 11.5–15.5)
WBC: 11.6 10*3/uL — ABNORMAL HIGH (ref 4.0–10.5)

## 2016-02-06 LAB — BASIC METABOLIC PANEL
Anion gap: 7 (ref 5–15)
BUN: 30 mg/dL — ABNORMAL HIGH (ref 6–20)
CALCIUM: 9 mg/dL (ref 8.9–10.3)
CO2: 26 mmol/L (ref 22–32)
CREATININE: 1.31 mg/dL — AB (ref 0.44–1.00)
Chloride: 104 mmol/L (ref 101–111)
GFR, EST AFRICAN AMERICAN: 41 mL/min — AB (ref >60)
GFR, EST NON AFRICAN AMERICAN: 35 mL/min — AB (ref >60)
GLUCOSE: 137 mg/dL — AB (ref 65–99)
Potassium: 4.7 mmol/L (ref 3.5–5.1)
Sodium: 137 mmol/L (ref 135–145)

## 2016-02-06 NOTE — Progress Notes (Signed)
OT Cancellation Note  Patient Details Name: Carla Taylor MRN: [id number] DOB: 04/26/1925   Cancelled Treatment:    Reason Eval/Treat Not Completed:  Pt with plans to d/c to SNF for ST rehab. Will defer OT to SNF.  Malka So 02/06/2016, 11:30 AM  [phone]

## 2016-02-06 NOTE — Progress Notes (Signed)
Physical Therapy Treatment Patient Details Name: Carla Taylor MRN: [id number] DOB: 1925/04/10 Today's Date: 02/06/2016    History of Present Illness Patient is a 82 y/o female with hx of Rt TSA, HTN, HLD, macular degeneration and SUI presents s/p right TKA.    PT Comments    Pt did well today with PT and ambulated into hallway with MIN/guard and RW.  She is motivated to work with PT so she can get her other knee done in 3 months.  Recommend short term SNF.  Follow Up Recommendations  SNF     Equipment Recommendations  None recommended by PT    Recommendations for Other Services       Precautions / Restrictions Precautions Precautions: Fall;Knee Precaution Booklet Issued: No Restrictions Weight Bearing Restrictions: Yes RLE Weight Bearing: Weight bearing as tolerated    Mobility  Bed Mobility               General bed mobility comments: Pt up in recliner upon arrival.  Transfers Overall transfer level: Needs assistance Equipment used: Rolling walker (2 wheeled) Transfers: Sit to/from Stand Sit to Stand: Min assist         General transfer comment: Cues for hand placement and technique  Ambulation/Gait Ambulation/Gait assistance: Min guard Ambulation Distance (Feet): 45 Feet Assistive device: Rolling walker (2 wheeled) Gait Pattern/deviations: Step-to pattern;Antalgic     General Gait Details: Pt with step to pattern, but good cadence.   Stairs            Wheelchair Mobility    Modified Rankin (Stroke Patients Only)       Balance Overall balance assessment: Needs assistance Sitting-balance support: Feet supported Sitting balance-Leahy Scale: Good     Standing balance support: During functional activity Standing balance-Leahy Scale: Fair Standing balance comment: Pt able to let go of RW and pull brief up                    Cognition Arousal/Alertness: Awake/alert Behavior During Therapy: WFL for tasks  assessed/performed Overall Cognitive Status: Within Functional Limits for tasks assessed                      Exercises Total Joint Exercises Ankle Circles/Pumps: Both;10 reps;Supine Quad Sets: Both;10 reps;Supine Heel Slides: AAROM;Right;10 reps Hip ABduction/ADduction: AAROM;Right;10 reps Straight Leg Raises: AAROM;Right;10 reps    General Comments General comments (skin integrity, edema, etc.): Daughter present      Pertinent Vitals/Pain Pain Assessment: 0-10 Pain Score: 5  Pain Location: R knee Pain Descriptors / Indicators: Operative site guarding;Grimacing;Sore Pain Intervention(s): Limited activity within patient's tolerance;Monitored during session;Repositioned    Home Living Family/patient expects to be discharged to:: Private residence Living Arrangements: Alone                  Prior Function            PT Goals (current goals can now be found in the care plan section) Acute Rehab PT Goals Patient Stated Goal: to get back to independence as fast as possible PT Goal Formulation: With patient Time For Goal Achievement: 02/19/16 Potential to Achieve Goals: Good Progress towards PT goals: Progressing toward goals    Frequency  7X/week    PT Plan Current plan remains appropriate    Co-evaluation             End of Session Equipment Utilized During Treatment: Gait belt;Right knee immobilizer Activity Tolerance: Patient tolerated treatment well Patient left: in  chair;with family/visitor present     Time: 1205-1230 PT Time Calculation (min) (ACUTE ONLY): 25 min  Charges:  $Gait Training: 8-22 mins $Therapeutic Exercise: 8-22 mins                    G Codes:      Lataisha Colan LUBECK 02/06/2016, 2:53 PM

## 2016-02-06 NOTE — Clinical Social Work Note (Signed)
Clinical Social Work Assessment  Patient Details  Name: Carla Taylor MRN: [id number] Date of Birth: 27-Nov-1924  Date of referral:  02/05/16               Reason for consult:  Facility Placement                Permission sought to share information with:  Family Supports Permission granted to share information::  Yes, Verbal Permission Granted  Name::     Carla Taylor::     Relationship::  Son  Contact Information:  [phone]  Housing/Transportation Living arrangements for the past 2 months:  Single Family Home Source of Information:  Patient Patient Interpreter Needed:  None Criminal Activity/Legal Involvement Pertinent to Current Situation/Hospitalization:  No - Comment as needed Significant Relationships:  Adult Children, Other Family Members Lives with:  Self Do you feel safe going back to the place where you live?  Yes (Patient feels safe at home but will be going to rehab at [facility] before returning home) Need for family participation in patient care:  Yes (Comment)  Care giving concerns:  Carla Taylor lives alone, so will d/c to [facility] once discharged.   Social Worker assessment / plan:  CSW talked with Carla Taylor, Engineer, site at [employer] regarding discharge plan. Patient has bundled services and will discharge to [facility] upon discharge (expected d/c date 7/20). Visited with patient and talked with her about her d/c to [facility]. Carla Taylor was lying in bed, alert, oriented, pleasant and open to talking with CSW about her discharge plan. Patient confirmed that her family will transport her to the facility and this was discussed. Patient also talked with CSW about her family, 3 living children and 7 grandchildren.  Employment status:  Retired Health visitor, Managed Care PT Recommendations:    Information / Referral to community resources:  Carla Taylor (SNF list not needed as patient already set to d/c to [facility] due  to bundled services)  Patient/Family's Response to care:  No concerns expressed regarding care received during hospitalization.  Patient/Family's Understanding of and Emotional Response to Diagnosis, Current Treatment, and Prognosis:  Not discussed.  Emotional Assessment Appearance:  Appears stated age Attitude/Demeanor/Rapport:  Other (Appropriate) Affect (typically observed):  Appropriate, Pleasant Orientation:  Oriented to Self, Oriented to Place, Oriented to  Time, Oriented to Situation Alcohol / Substance use:  Never Used Psych involvement (Current and /or in the community):  No (Comment)  Discharge Needs  Concerns to be addressed:  Discharge Planning Concerns Readmission within the last 30 days:  No Current discharge risk:  None Barriers to Discharge:  No Barriers Identified   Carla Feil, LCSW 02/06/2016, 4:42 PM

## 2016-02-06 NOTE — Discharge Instructions (Signed)
INSTRUCTIONS AFTER JOINT REPLACEMENT  ° °o Remove items at home which could result in a fall. This includes throw rugs or furniture in walking pathways °o ICE to the affected joint every three hours while awake for 30 minutes at a time, for at least the first 3-5 days, and then as needed for pain and swelling.  Continue to use ice for pain and swelling. You may notice swelling that will progress down to the foot and ankle.  This is normal after surgery.  Elevate your leg when you are not up walking on it.   °o Continue to use the breathing machine you got in the hospital (incentive spirometer) which will help keep your temperature down.  It is common for your temperature to cycle up and down following surgery, especially at night when you are not up moving around and exerting yourself.  The breathing machine keeps your lungs expanded and your temperature down. ° ° °DIET:  As you were doing prior to hospitalization, we recommend a well-balanced diet. ° °DRESSING / WOUND CARE / SHOWERING ° °You may change your dressing 3-5 days after surgery.  Then change the dressing every day with sterile gauze.  Please use good hand washing techniques before changing the dressing.  Do not use any lotions or creams on the incision until instructed by your surgeon. ° °ACTIVITY ° °o Increase activity slowly as tolerated, but follow the weight bearing instructions below.   °o No driving for 6 weeks or until further direction given by your physician.  You cannot drive while taking narcotics.  °o No lifting or carrying greater than 10 lbs. until further directed by your surgeon. °o Avoid periods of inactivity such as sitting longer than an hour when not asleep. This helps prevent blood clots.  °o You may return to work once you are authorized by your doctor.  ° ° ° °WEIGHT BEARING  ° °Weight bearing as tolerated with assist device (walker, cane, etc) as directed, use it as long as suggested by your surgeon or therapist, typically at  least 4-6 weeks. ° ° °EXERCISES ° °Results after joint replacement surgery are often greatly improved when you follow the exercise, range of motion and muscle strengthening exercises prescribed by your doctor. Safety measures are also important to protect the joint from further injury. Any time any of these exercises cause you to have increased pain or swelling, decrease what you are doing until you are comfortable again and then slowly increase them. If you have problems or questions, call your caregiver or physical therapist for advice.  ° °Rehabilitation is important following a joint replacement. After just a few days of immobilization, the muscles of the leg can become weakened and shrink (atrophy).  These exercises are designed to build up the tone and strength of the thigh and leg muscles and to improve motion. Often times heat used for twenty to thirty minutes before working out will loosen up your tissues and help with improving the range of motion but do not use heat for the first two weeks following surgery (sometimes heat can increase post-operative swelling).  ° °These exercises can be done on a training (exercise) mat, on the floor, on a table or on a bed. Use whatever works the best and is most comfortable for you.    Use music or television while you are exercising so that the exercises are a pleasant break in your day. This will make your life better with the exercises acting as a break   in your routine that you can look forward to.   Perform all exercises about fifteen times, three times per day or as directed.  You should exercise both the operative leg and the other leg as well. ° °Exercises include: °  °• Quad Sets - Tighten up the muscle on the front of the thigh (Quad) and hold for 5-10 seconds.   °• Straight Leg Raises - With your knee straight (if you were given a brace, keep it on), lift the leg to 60 degrees, hold for 3 seconds, and slowly lower the leg.  Perform this exercise against  resistance later as your leg gets stronger.  °• Leg Slides: Lying on your back, slowly slide your foot toward your buttocks, bending your knee up off the floor (only go as far as is comfortable). Then slowly slide your foot back down until your leg is flat on the floor again.  °• Angel Wings: Lying on your back spread your legs to the side as far apart as you can without causing discomfort.  °• Hamstring Strength:  Lying on your back, push your heel against the floor with your leg straight by tightening up the muscles of your buttocks.  Repeat, but this time bend your knee to a comfortable angle, and push your heel against the floor.  You may put a pillow under the heel to make it more comfortable if necessary.  ° °A rehabilitation program following joint replacement surgery can speed recovery and prevent re-injury in the future due to weakened muscles. Contact your doctor or a physical therapist for more information on knee rehabilitation.  ° ° °CONSTIPATION ° °Constipation is defined medically as fewer than three stools per week and severe constipation as less than one stool per week.  Even if you have a regular bowel pattern at home, your normal regimen is likely to be disrupted due to multiple reasons following surgery.  Combination of anesthesia, postoperative narcotics, change in appetite and fluid intake all can affect your bowels.  ° °YOU MUST use at least one of the following options; they are listed in order of increasing strength to get the job done.  They are all available over the counter, and you may need to use some, POSSIBLY even all of these options:   ° °Drink plenty of fluids (prune juice may be helpful) and high fiber foods °Colace 100 mg by mouth twice a day  °Senokot for constipation as directed and as needed Dulcolax (bisacodyl), take with full glass of water  °Miralax (polyethylene glycol) once or twice a day as needed. ° °If you have tried all these things and are unable to have a bowel  movement in the first 3-4 days after surgery call either your surgeon or your primary doctor.   ° °If you experience loose stools or diarrhea, hold the medications until you stool forms back up.  If your symptoms do not get better within 1 week or if they get worse, check with your doctor.  If you experience "the worst abdominal pain ever" or develop nausea or vomiting, please contact the office immediately for further recommendations for treatment. ° ° °ITCHING:  If you experience itching with your medications, try taking only a single pain pill, or even half a pain pill at a time.  You can also use Benadryl over the counter for itching or also to help with sleep.  ° °TED HOSE STOCKINGS:  Use stockings on both legs until for at least 2 weeks or as   directed by physician office. They may be removed at night for sleeping.  MEDICATIONS:  See your medication summary on the After Visit Summary that nursing will review with you.  You may have some home medications which will be placed on hold until you complete the course of blood thinner medication.  It is important for you to complete the blood thinner medication as prescribed.  PRECAUTIONS:  If you experience chest pain or shortness of breath - call 911 immediately for transfer to the hospital emergency department.   If you develop a fever greater that 101 F, purulent drainage from wound, increased redness or drainage from wound, foul odor from the wound/dressing, or calf pain - CONTACT YOUR SURGEON.                                                   FOLLOW-UP APPOINTMENTS:  If you do not already have a post-op appointment, please call the office for an appointment to be seen by your surgeon.  Guidelines for how soon to be seen are listed in your After Visit Summary, but are typically between 1-4 weeks after surgery.  OTHER INSTRUCTIONS:   Knee Replacement:  Do not place pillow under knee, focus on keeping the knee straight while resting. CPM  instructions: 0-90 degrees, 2 hours in the morning, 2 hours in the afternoon, and 2 hours in the evening. Place foam block, curve side up under heel at all times except when in CPM or when walking.  DO NOT modify, tear, cut, or change the foam block in any way.  MAKE SURE YOU:   Understand these instructions.   Get help right away if you are not doing well or get worse.    Thank you for letting us be a part of your medical care team.  It is a privilege we respect greatly.  We hope these instructions will help you stay on track for a fast and full recovery!   Information on my medicine - XARELTO (Rivaroxaban)  This medication education was reviewed with me or my healthcare representative as part of my discharge preparation.  The pharmacist that spoke with me during my hospital stay was:  Charita Lindenberger, [place] John, [facility]  Why was Xarelto prescribed for you? Xarelto was prescribed for you to reduce the risk of blood clots forming after orthopedic surgery. The medical term for these abnormal blood clots is venous thromboembolism (VTE).  What do you need to know about xarelto ? Take your Xarelto ONCE DAILY at the same time every day. You may take it either with or without food.  If you have difficulty swallowing the tablet whole, you may crush it and mix in applesauce just prior to taking your dose.  Take Xarelto exactly as prescribed by your doctor and DO NOT stop taking Xarelto without talking to the doctor who prescribed the medication.  Stopping without other VTE prevention medication to take the place of Xarelto may increase your risk of developing a clot.  After discharge, you should have regular check-up appointments with your healthcare provider that is prescribing your Xarelto.    What do you do if you miss a dose? If you miss a dose, take it as soon as you remember on the same day then continue your regularly scheduled once daily regimen the next day. Do not take two doses of Xarelto  on the same day.   Important Safety Information A possible side effect of Xarelto is bleeding. You should call your healthcare provider right away if you experience any of the following: ? Bleeding from an injury or your nose that does not stop. ? Unusual colored urine (red or dark brown) or unusual colored stools (red or black). ? Unusual bruising for unknown reasons. ? A serious fall or if you hit your head (even if there is no bleeding).  Some medicines may interact with Xarelto and might increase your risk of bleeding while on Xarelto. To help avoid this, consult your healthcare provider or pharmacist prior to using any new prescription or non-prescription medications, including herbals, vitamins, non-steroidal anti-inflammatory drugs (NSAIDs) and supplements.  This website has more information on Xarelto: [email or web address]

## 2016-02-06 NOTE — Care Management Important Message (Signed)
Important Message  Patient Details  Name: Carla Taylor MRN: [id number] Date of Birth: May 04, 1925   Medicare Important Message Given:  Yes    Loann Quill 02/06/2016, 10:09 AM

## 2016-02-06 NOTE — Clinical Social Work Placement (Signed)
   CLINICAL SOCIAL WORK PLACEMENT  NOTE PATIENT WILL D/C TO [facility] 7/   /17  Date:  02/06/2016  Patient Details  Name: Carla Taylor MRN: [id number] Date of Birth: 10/04/1924  Clinical Social Work is seeking post-discharge placement for this patient at the [place] level of care (*CSW will initial, date and re-position this form in  chart as items are completed):  No   Patient/family provided with [facility] Work Department's list of facilities offering this level of care within the geographic area requested by the patient (or if unable, by the patient's family).  Yes   Patient/family informed of their freedom to choose among providers that offer the needed level of care, that participate in Medicare, Medicaid or managed care program needed by the patient, have an available bed and are willing to accept the patient.  No   Patient/family informed of [facility]'s ownership interest in [facility] and [facility], as well as of the fact that they are under no obligation to receive care at these facilities.  PASRR submitted to EDS on 02/06/16 (MUST ID [id number]. )     PASRR number received on 02/06/16 ([id number] )     Existing PASRR number confirmed on       FL2 transmitted to all facilities in geographic area requested by pt/family on 02/06/16     FL2 transmitted to all facilities within larger geographic area on       Patient informed that his/her managed care company has contracts with or will negotiate with certain facilities, including the following:        Yes (Patient will d/c to [facility])   Patient/family informed of bed offers received.  Patient chooses bed at [place] [place]     Physician recommends and patient chooses bed at      Patient to be transferred to [place] [place] on  .  Patient to be transferred to facility by  family     Patient family notified on   of  transfer.  Name of family member notified:        PHYSICIAN       Additional Comment:    _______________________________________________ [name], LCSW 02/06/2016, 4:52 PM

## 2016-02-06 NOTE — Progress Notes (Signed)
Patient ID: Carla Taylor, female   DOB: 09-14-1924, 82 y.o.   MRN: [id number]     Subjective:  Patient reports pain as mild to moderate.  Patient states that she has had some nausea but is better now.  Denies any CP or SOB  Objective:   VITALS:   Filed Vitals:   02/05/16 1220 02/05/16 2000 02/06/16 0500 02/06/16 0916  BP: 139/56 160/63 174/66 122/42  Pulse: 54 66 67 62  Temp: 97.6 F (36.4 C) 100.3 F (37.9 C) 98.8 F (37.1 C)   TempSrc: Oral Oral Oral   Resp: 16 18 16 16   SpO2: 99% 97% 98% 98%    ABD soft Sensation intact distally Dorsiflexion/Plantar flexion intact Incision: dressing C/D/I and no drainage   Lab Results  Component Value Date   WBC 11.6* 02/06/2016   HGB 9.8* 02/06/2016   HCT 30.4* 02/06/2016   MCV 93.0 02/06/2016   PLT 153 02/06/2016   BMET    Component Value Date/Time   NA 137 02/06/2016 0508   K 4.7 02/06/2016 0508   CL 104 02/06/2016 0508   CO2 26 02/06/2016 0508   GLUCOSE 137* 02/06/2016 0508   BUN 30* 02/06/2016 0508   CREATININE 1.31* 02/06/2016 0508   CREATININE 1.01 09/18/2014 0953   CALCIUM 9.0 02/06/2016 0508   GFRNONAA 35* 02/06/2016 0508   GFRNONAA 50* 09/18/2014 0953   GFRAA 41* 02/06/2016 0508   GFRAA 58* 09/18/2014 0953     Assessment/Plan: 1 Day Post-Op   Principal Problem:   Primary localized osteoarthritis of right knee Active Problems:   S/P total knee arthroplasty   Advance diet Up with therapy Plan for discharge tomorrow WBAT Dry dressing PRN   Remonia Richter 02/06/2016, 9:51 AM  Discussed and agree with above. ABLA mild, observe. SNF thurs.  Marchia Bond, MD Cell [phone]

## 2016-02-06 NOTE — Discharge Planning (Signed)
Patient seen on rounds. She is awake, alert and oriented. She will dishcarge to [facility] for short term rehab when medically stable. Her family will transport.   CM will follow   Ladell Heads, [place]  [facility]  [phone]

## 2016-02-06 NOTE — NC FL2 (Signed)
[place] MEDICAID FL2 LEVEL OF CARE SCREENING TOOL     IDENTIFICATION  Patient Name: Carla Taylor Birthdate: March 18, 1925 Sex: female Admission Date (Current Location): 02/05/2016  [facility] and [place] Number:  Herbalist and Address:  [facility] [place], [place] [address] [place],  [address]      Provider Number: [id number]  Attending Physician Name and Address:  Marchia Bond, MD  Relative Name and Phone Number:  Chancie [place]oupka - son. Phone [phone]    Current Level of Care: Hospital Recommended Level of Care: [place] Prior Approval Number:    Date Approved/Denied:   PASRR Number: [id number] (Eff. 02/06/16)  Discharge Plan: SNF    Current Diagnoses: Patient Active Problem List   Diagnosis Date Noted  . Primary localized osteoarthritis of right knee 02/05/2016  . S/P total knee arthroplasty 02/05/2016  . 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 Preop cardiovascular exam 05/18/2014  . Edema 05/18/2014  . Hypokalemia 05/07/2014  . Morbid obesity ([facility] [place]) 05/06/2014  . Osteoarthritis, shoulder 05/06/2014  . Hypothyroidism 05/06/2014  . Acute pancreatitis 05/05/2014  . Essential hypertension 05/05/2014    Orientation RESPIRATION BLADDER Height & Weight     Self, Time, Situation, Place  Normal Incontinent Weight:   Height:     BEHAVIORAL SYMPTOMS/MOOD NEUROLOGICAL BOWEL NUTRITION STATUS      Continent Diet (Regular)  AMBULATORY STATUS COMMUNICATION OF NEEDS Skin   Extensive Assist (PT unable to work with patient on 7/18 due to nausea, dizziness and pallor) Verbally Normal, Other (Comment) (Incision right knee)                       Personal Care Assistance Level of Assistance  Bathing, Feeding, Dressing Bathing Assistance: Maximum assistance Feeding assistance: Independent Dressing Assistance: Maximum assistance     Functional Limitations Info  Sight, Hearing, Speech Sight Info:  Adequate Hearing Info: Impaired (Impaired hearing in both ears) Speech Info: Adequate    SPECIAL CARE FACTORS FREQUENCY  PT (By licensed PT)     PT Frequency: Evaluated 7/18 and a minimum of 7X per week therapy recommended              Contractures Contractures Info: Not present    Additional Factors Info  Code Status, Allergies Code Status Info: Full code Allergies Info: No known allergies           Current Medications (02/06/2016):  This is the current hospital active medication list Current Facility-Administered Medications  Medication Dose Route Frequency Provider Last Rate Last Dose  . 0.45 % NaCl with KCl 20 mEq / L infusion   Intravenous Continuous Marchia Bond, MD 75 mL/hr at 02/05/16 2028 75 mL/hr at 02/05/16 2028  . acetaminophen (TYLENOL) tablet 650 mg  650 mg Oral Q6H PRN Marchia Bond, MD       Or  . acetaminophen (TYLENOL) suppository 650 mg  650 mg Rectal Q6H PRN Marchia Bond, MD      . alum & mag hydroxide-simeth (MAALOX/MYLANTA) 200-200-20 MG/5ML suspension 30 mL  30 mL Oral Q4H PRN Marchia Bond, MD      . amLODipine (NORVASC) tablet 5 mg  5 mg Oral Daily Marchia Bond, MD   5 mg at 02/06/16 1045  . bisacodyl (DULCOLAX) suppository 10 mg  10 mg Rectal Daily PRN Marchia Bond, MD      . calcium carbonate (OS-CAL - dosed in mg of elemental calcium) tablet 1,250 mg  1,250 mg Oral  BID WC Marchia Bond, MD   1,250 mg at 02/06/16 0655  . cyanocobalamin tablet 500 mcg  500 mcg Oral Daily Marchia Bond, MD   500 mcg at 02/06/16 [id number]  . diphenhydrAMINE (BENADRYL) 12.5 MG/5ML elixir 12.5-25 mg  12.5-25 mg Oral Q4H PRN Marchia Bond, MD      . docusate sodium (COLACE) capsule 100 mg  100 mg Oral BID Marchia Bond, MD   100 mg at 02/06/16 0920  . doxazosin (CARDURA) tablet 2 mg  2 mg Oral Daily Marchia Bond, MD   2 mg at 02/06/16 1045  . hydrochlorothiazide (HYDRODIURIL) tablet 25 mg  25 mg Oral Daily Marchia Bond, MD   25 mg at 02/06/16 1048  .  HYDROcodone-acetaminophen (NORCO) 10-325 MG per tablet 1-2 tablet  1-2 tablet Oral Q4H PRN Marchia Bond, MD   1 tablet at 02/06/16 1048  . HYDROmorphone (DILAUDID) injection 0.5 mg  0.5 mg Intravenous Q2H PRN Marchia Bond, MD      . levothyroxine (SYNTHROID, LEVOTHROID) tablet 88 mcg  88 mcg Oral QAC breakfast Marchia Bond, MD   88 mcg at 02/06/16 0655  . lisinopril (PRINIVIL,ZESTRIL) tablet 40 mg  40 mg Oral Daily Marchia Bond, MD   40 mg at 02/06/16 1045  . magnesium citrate solution 1 Bottle  1 Bottle Oral Once PRN Marchia Bond, MD      . magnesium hydroxide (MILK OF MAGNESIA) suspension 30 mL  30 mL Oral Daily PRN Marchia Bond, MD      . menthol-cetylpyridinium (CEPACOL) lozenge 3 mg  1 lozenge Oral PRN Marchia Bond, MD       Or  . phenol (CHLORASEPTIC) mouth spray 1 spray  1 spray Mouth/Throat PRN Marchia Bond, MD      . methocarbamol (ROBAXIN) tablet 500 mg  500 mg Oral Q6H PRN Marchia Bond, MD   500 mg at 02/06/16 0920   Or  . methocarbamol (ROBAXIN) 500 mg in dextrose 5 % 50 mL IVPB  500 mg Intravenous Q6H PRN Marchia Bond, MD      . metoCLOPramide (REGLAN) tablet 5-10 mg  5-10 mg Oral Q8H PRN Marchia Bond, MD       Or  . metoCLOPramide (REGLAN) injection 5-10 mg  5-10 mg Intravenous Q8H PRN Marchia Bond, MD      . metoprolol succinate (TOPROL-XL) 24 hr tablet 50 mg  50 mg Oral Daily Marchia Bond, MD   50 mg at 02/06/16 1045  . multivitamin (PROSIGHT) tablet 1 tablet  1 tablet Oral BID Skeet Simmer, [facility]   1 tablet at 02/06/16 0920  . ondansetron (ZOFRAN) tablet 4 mg  4 mg Oral Q6H PRN Marchia Bond, MD       Or  . ondansetron [facility]) injection 4 mg  4 mg Intravenous Q6H PRN Marchia Bond, MD   4 mg at 02/05/16 1519  . polyethylene glycol (MIRALAX / GLYCOLAX) packet 17 g  17 g Oral Daily PRN Marchia Bond, MD      . polyvinyl alcohol (LIQUIFILM TEARS) 1.4 % ophthalmic solution 1 drop  1 drop Both Eyes PRN Skeet Simmer, RPH      . rivaroxaban (XARELTO) tablet 10 mg  10  mg Oral Q breakfast Marchia Bond, MD   10 mg at 02/06/16 0654  . senna (SENOKOT) tablet 8.6 mg  1 tablet Oral BID Marchia Bond, MD   8.6 mg at 02/06/16 [id number]  . [START ON 02/10/2016] Vitamin D (Ergocalciferol) (DRISDOL) capsule 50,000 Units  50,000 Units Oral  Q Unk Lightning, MD       Facility-Administered Medications Ordered in Other Encounters  Medication Dose Route Frequency Provider Last Rate Last Dose  . vancomycin (VANCOCIN) 1,000 mg in sodium chloride 0.9 % 500 mL IVPB  1,000 mg Intravenous Once Marchia Bond, MD         Discharge Medications: Please see discharge summary for a list of discharge medications.  Relevant Imaging Results:  Relevant Lab Results:   Additional Information [phone].  [name], LCSW

## 2016-02-07 DIAGNOSIS — K5909 Other constipation: Secondary | ICD-10-CM | POA: Diagnosis not present

## 2016-02-07 DIAGNOSIS — R278 Other lack of coordination: Secondary | ICD-10-CM | POA: Diagnosis not present

## 2016-02-07 DIAGNOSIS — Z7901 Long term (current) use of anticoagulants: Secondary | ICD-10-CM | POA: Diagnosis not present

## 2016-02-07 DIAGNOSIS — Z96651 Presence of right artificial knee joint: Secondary | ICD-10-CM | POA: Diagnosis not present

## 2016-02-07 DIAGNOSIS — R279 Unspecified lack of coordination: Secondary | ICD-10-CM | POA: Diagnosis not present

## 2016-02-07 DIAGNOSIS — E038 Other specified hypothyroidism: Secondary | ICD-10-CM | POA: Diagnosis not present

## 2016-02-07 DIAGNOSIS — M25561 Pain in right knee: Secondary | ICD-10-CM | POA: Diagnosis not present

## 2016-02-07 DIAGNOSIS — M6281 Muscle weakness (generalized): Secondary | ICD-10-CM | POA: Diagnosis not present

## 2016-02-07 DIAGNOSIS — Z96659 Presence of unspecified artificial knee joint: Secondary | ICD-10-CM | POA: Diagnosis not present

## 2016-02-07 DIAGNOSIS — Z4733 Aftercare following explantation of knee joint prosthesis: Secondary | ICD-10-CM | POA: Diagnosis not present

## 2016-02-07 DIAGNOSIS — I10 Essential (primary) hypertension: Secondary | ICD-10-CM | POA: Diagnosis not present

## 2016-02-07 DIAGNOSIS — R2681 Unsteadiness on feet: Secondary | ICD-10-CM | POA: Diagnosis not present

## 2016-02-07 DIAGNOSIS — D6489 Other specified anemias: Secondary | ICD-10-CM | POA: Diagnosis not present

## 2016-02-07 LAB — CBC
HCT: 26.9 % — ABNORMAL LOW (ref 36.0–46.0)
Hemoglobin: 8.8 g/dL — ABNORMAL LOW (ref 12.0–15.0)
MCH: 30.8 pg (ref 26.0–34.0)
MCHC: 32.7 g/dL (ref 30.0–36.0)
MCV: 94.1 fL (ref 78.0–100.0)
PLATELETS: 144 10*3/uL — AB (ref 150–400)
RBC: 2.86 MIL/uL — ABNORMAL LOW (ref 3.87–5.11)
RDW: 12.7 % (ref 11.5–15.5)
WBC: 12.6 10*3/uL — ABNORMAL HIGH (ref 4.0–10.5)

## 2016-02-07 NOTE — Discharge Summary (Signed)
Physician Discharge Summary  Patient ID: Carla Taylor MRN: [id number] DOB/AGE: [birth year] 82 y.o.  Admit date: 02/05/2016 Discharge date: 02/07/2016  Admission Diagnoses:  Primary localized osteoarthritis of right knee  Discharge Diagnoses:  Principal Problem:   Primary localized osteoarthritis of right knee Active Problems:   S/P total knee arthroplasty   Past Medical History  Diagnosis Date  . Arthritis   . Hypertension   . Chronic shoulder pain   . Hyperlipidemia   . Hypothyroid   . Pancreatitis   . Shortness of breath dyspnea     on exertion  . Macular degeneration   . 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 Stress bladder incontinence, female   . Primary localized osteoarthritis of right knee 02/05/2016    Surgeries: Procedure(s): RIGHT TOTAL KNEE ARTHROPLASTY on 02/05/2016   Consultants (if any):    Discharged Condition: Improved  Hospital Course: Carla Taylor is an 82 y.o. female who was admitted 02/05/2016 with a diagnosis of Primary localized osteoarthritis of right knee and went to the operating room on 02/05/2016 and underwent the above named procedures.    She was given perioperative antibiotics:  Anti-infectives    Start     Dose/Rate Route Frequency Ordered Stop   02/05/16 1400  ceFAZolin (ANCEF) IVPB 2g/100 mL premix     2 g 200 mL/hr over 30 Minutes Intravenous Every 6 hours 02/05/16 1237 02/05/16 2058   02/05/16 0548  ceFAZolin (ANCEF) 2-4 GM/100ML-% IVPB    Comments:  Merryl Hacker   : cabinet override      02/05/16 0548 02/05/16 1759   02/05/16 0547  ceFAZolin (ANCEF) IVPB 2g/100 mL premix     2 g 200 mL/hr over 30 Minutes Intravenous On call to O.R. 02/05/16 [id number] 02/05/16 0732    .  She was given sequential compression devices, early ambulation, and xarelto for DVT prophylaxis.  She benefited maximally from the hospital stay and there were no complications.    Recent vital signs:  Filed Vitals:   02/07/16 0028 02/07/16 0615   BP: 128/54 130/56  Pulse: 60 62  Temp: 98.4 F (36.9 C) 98.2 F (36.8 C)  Resp: 18 17    Recent laboratory studies:  Lab Results  Component Value Date   HGB 8.8* 02/07/2016   HGB 9.8* 02/06/2016   HGB 12.7 01/25/2016   Lab Results  Component Value Date   WBC 12.6* 02/07/2016   PLT 144* 02/07/2016   Lab Results  Component Value Date   INR 1.09 05/05/2014   Lab Results  Component Value Date   NA 137 02/06/2016   K 4.7 02/06/2016   CL 104 02/06/2016   CO2 26 02/06/2016   BUN 30* 02/06/2016   CREATININE 1.31* 02/06/2016   GLUCOSE 137* 02/06/2016    Discharge Medications:     Medication List    STOP taking these medications        aspirin EC 81 MG tablet      TAKE these medications        amLODipine 5 MG tablet  Commonly known as:  NORVASC  Take 5 mg by mouth daily.     baclofen 10 MG tablet  Commonly known as:  LIORESAL  Take 1 tablet (10 mg total) by mouth 3 (three) times daily. As needed for muscle spasm     calcium carbonate 1250 (500 Ca) MG tablet  Commonly known as:  OS-CAL - dosed in mg of elemental calcium  Take 1,250 mg by mouth  2 (two) times daily with a meal.     doxazosin 2 MG tablet  Commonly known as:  CARDURA  Take 1 tablet (2 mg total) by mouth daily. PLEASE OBTAIN FUTURE REFILLS FROM PCP.     hydrochlorothiazide 25 MG tablet  Commonly known as:  HYDRODIURIL  Take 25 mg by mouth daily.     HYDROcodone-acetaminophen 10-325 MG tablet  Commonly known as:  NORCO  Take 1-2 tablets by mouth every 6 (six) hours as needed.     ICAPS AREDS FORMULA PO  Take 1 tablet by mouth 2 (two) times daily.     levothyroxine 88 MCG tablet  Commonly known as:  SYNTHROID, LEVOTHROID  Take 88 mcg by mouth daily before breakfast.     lisinopril 40 MG tablet  Commonly known as:  PRINIVIL,ZESTRIL  Take 1 tablet (40 mg total) by mouth daily. PLEASE OBTAIN FUTURE REFILLS FROM PCP.     magnesium hydroxide 400 MG/5ML suspension  Commonly known as:  MILK  OF MAGNESIA  Take 30 mLs by mouth daily as needed for mild constipation.     metoprolol succinate 50 MG 24 hr tablet  Commonly known as:  TOPROL-XL  TAKE ONE AND ONE HALF TABLETS ONCE DAILY     rivaroxaban 10 MG Tabs tablet  Commonly known as:  XARELTO  Take 1 tablet (10 mg total) by mouth daily.     sennosides-docusate sodium 8.6-50 MG tablet  Commonly known as:  SENOKOT-S  Take 2 tablets by mouth daily.     sennosides-docusate sodium 8.6-50 MG tablet  Commonly known as:  SENOKOT-S  Take 2 tablets by mouth daily.     SYSTANE 0.4-0.3 % Soln  Generic drug:  Polyethyl Glycol-Propyl Glycol  Place 1 drop into both eyes as needed (for dry eyes).     vitamin B-12 500 MCG tablet  Commonly known as:  CYANOCOBALAMIN  Take 500 mcg by mouth daily.     Vitamin D (Ergocalciferol) 50000 units Caps capsule  Commonly known as:  DRISDOL  Take 50,000 Units by mouth every Sunday.        Diagnostic Studies: Dg Knee Right Port  02/05/2016  CLINICAL DATA:  Patient status post knee replacement scratch the patient status post right knee arthroplasty. EXAM: PORTABLE RIGHT KNEE - 1-2 VIEW COMPARISON:  None. FINDINGS: Patient status post right knee arthroplasty. Hardware appears intact. Normal anatomic alignment. Small amount of gas within the overlying soft tissues compatible postoperative state. Vascular calcifications. IMPRESSION: Patient status post right knee arthroplasty.  No acute process. Electronically Signed   By: Lovey Newcomer M.D.   On: 02/05/2016 13:28    Disposition: SNF [facility]      Discharge Instructions    Weight bearing as tolerated    Complete by:  As directed            Follow-up Information    Follow up with Johnny Bridge, MD. Schedule an appointment as soon as possible for a visit in 2 weeks.   Specialty:  Orthopedic Surgery   Contact information:   [place] [address] [phone]        Signed: Johnny Bridge 02/07/2016,  12:40 PM

## 2016-02-07 NOTE — Progress Notes (Signed)
Carla Taylor to be D/C'd Skilled nursing facility per MD order.  Discussed with the patient and all questions fully answered.  VSS, Skin clean, dry and intact without evidence of skin break down, no evidence of skin tears noted. IV catheter discontinued intact. Site without signs and symptoms of complications. Dressing and pressure applied.  An After Visit Summary was printed and given to the patient. Patient received prescription.  D/c education completed with patient/family including follow up instructions, medication list, d/c activities limitations if indicated, with other d/c instructions as indicated by MD - patient able to verbalize understanding, all questions fully answered.   Patient instructed to return to ED, call 911, or call MD for any changes in condition.   Patient escorted via [place], and D/C  To [place], [place]. Full report given to facility. Jerry Caras 02/07/2016 2:13 PM

## 2016-02-07 NOTE — Progress Notes (Signed)
Patient ID: Carla Taylor, female   DOB: [birth year], 82 y.o.   MRN: [id number]     Subjective:  Patient reports pain as mild.  Patient denies any CP or SOB  Objective:   VITALS:   Filed Vitals:   02/06/16 1314 02/06/16 2209 02/07/16 0028 02/07/16 0615  BP: 120/44 134/51 128/54 130/56  Pulse: 58 61 60 62  Temp: 97.9 F (36.6 C) 98.6 F (37 C) 98.4 F (36.9 C) 98.2 F (36.8 C)  TempSrc: Oral Oral Axillary Axillary  Resp: 16 16 18 17   SpO2: 92% 94% 96% 93%    ABD soft Sensation intact distally Dorsiflexion/Plantar flexion intact Incision: dressing C/D/I and no drainage Dry dressing applied  Lab Results  Component Value Date   WBC 12.6* 02/07/2016   HGB 8.8* 02/07/2016   HCT 26.9* 02/07/2016   MCV 94.1 02/07/2016   PLT 144* 02/07/2016   BMET    Component Value Date/Time   NA 137 02/06/2016 0508   K 4.7 02/06/2016 0508   CL 104 02/06/2016 0508   CO2 26 02/06/2016 0508   GLUCOSE 137* 02/06/2016 0508   BUN 30* 02/06/2016 0508   CREATININE 1.31* 02/06/2016 0508   CREATININE 1.01 09/18/2014 0953   CALCIUM 9.0 02/06/2016 0508   GFRNONAA 35* 02/06/2016 0508   GFRNONAA 50* 09/18/2014 0953   GFRAA 41* 02/06/2016 0508   GFRAA 58* 09/18/2014 0953     Assessment/Plan: 2 Days Post-Op   Principal Problem:   Primary localized osteoarthritis of right knee Active Problems:   S/P total knee arthroplasty   Advance diet Up with therapy Discharge to SNF WBAT Dry dressing   DOUGLAS Viren Lebeau 02/07/2016, 1:13 PM   Marchia Bond, MD Cell [phone]

## 2016-02-07 NOTE — Progress Notes (Signed)
Physical Therapy Treatment Patient Details Name: Carla Taylor MRN: [id number] DOB: [birth year] Today's Date: February 12, 2016    History of Present Illness Patient is a 82 y/o female with hx of Rt TSA, HTN, HLD, macular degeneration and SUI presents s/p right TKA.    PT Comments    Pt able to improve ambulation distance today and performed a few LE exercises.  Pt to d/c to SNF today.  Follow Up Recommendations  SNF     Equipment Recommendations  None recommended by PT    Recommendations for Other Services       Precautions / Restrictions Precautions Precautions: Fall;Knee Precaution Comments: Reveiwed no pillow under knee and precautions. Required Braces or Orthoses: Knee Immobilizer - Right Restrictions RLE Weight Bearing: Weight bearing as tolerated    Mobility  Bed Mobility Overal bed mobility: Needs Assistance Bed Mobility: Supine to Sit     Supine to sit: Min guard;HOB elevated     General bed mobility comments: verbal cues for technique  Transfers Overall transfer level: Needs assistance Equipment used: Rolling walker (2 wheeled)   Sit to Stand: Min assist         General transfer comment: assist to rise and steady, verbal cues for UE and LE positioning  Ambulation/Gait Ambulation/Gait assistance: Min guard Ambulation Distance (Feet): 80 Feet Assistive device: Rolling walker (2 wheeled) Gait Pattern/deviations: Step-to pattern;Antalgic     General Gait Details: verbal cues for sequence, RW positioning   Stairs            Wheelchair Mobility    Modified Rankin (Stroke Patients Only)       Balance                                    Cognition Arousal/Alertness: Awake/alert Behavior During Therapy: WFL for tasks assessed/performed Overall Cognitive Status: Within Functional Limits for tasks assessed                      Exercises Total Joint Exercises Ankle Circles/Pumps: Both;10 reps;AROM Quad Sets:  AROM;Both;10 reps Short Arc Quad: AROM;Right;10 reps Heel Slides: AAROM;Right;10 reps    General Comments        Pertinent Vitals/Pain Pain Assessment: 0-10 Pain Score: 4  Pain Location: R knee Pain Descriptors / Indicators: Aching;Sore Pain Intervention(s): Limited activity within patient's tolerance;Monitored during session;Premedicated before session;Repositioned    Home Living                      Prior Function            PT Goals (current goals can now be found in the care plan section) Progress towards PT goals: Progressing toward goals    Frequency  7X/week    PT Plan Current plan remains appropriate    Co-evaluation             End of Session Equipment Utilized During Treatment: Gait belt;Right knee immobilizer Activity Tolerance: Patient tolerated treatment well Patient left: in chair;with family/visitor present     Time: 1030-1048 PT Time Calculation (min) (ACUTE ONLY): 18 min  Charges:  $Gait Training: 8-22 mins                    G Codes:      Carla Taylor,Carla Taylor 02-12-2016, 12:57 PM Carla Taylor, PT, DPT 02-12-16 Pager: [phone]

## 2016-02-07 NOTE — Clinical Social Work Note (Addendum)
Patient will discharge today per MD order. Patient will discharge to: [facility] RN to call report prior to transportation to: [phone] Transportation: [facility]  CSW sent discharge summary to SNF for review.  RN updated.  Nonnie Done, LCSW [phone]  5N 24[address]32 and [place] Licensed Clinical Social Worker

## 2016-02-10 DIAGNOSIS — K5909 Other constipation: Secondary | ICD-10-CM | POA: Diagnosis not present

## 2016-02-10 DIAGNOSIS — I10 Essential (primary) hypertension: Secondary | ICD-10-CM | POA: Diagnosis not present

## 2016-02-10 DIAGNOSIS — E038 Other specified hypothyroidism: Secondary | ICD-10-CM | POA: Diagnosis not present

## 2016-02-10 DIAGNOSIS — Z7901 Long term (current) use of anticoagulants: Secondary | ICD-10-CM | POA: Diagnosis not present

## 2016-02-10 DIAGNOSIS — Z96651 Presence of right artificial knee joint: Secondary | ICD-10-CM | POA: Diagnosis not present

## 2016-02-10 DIAGNOSIS — D6489 Other specified anemias: Secondary | ICD-10-CM | POA: Diagnosis not present

## 2016-02-14 DIAGNOSIS — M25661 Stiffness of right knee, not elsewhere classified: Secondary | ICD-10-CM | POA: Diagnosis not present

## 2016-02-14 DIAGNOSIS — M25561 Pain in right knee: Secondary | ICD-10-CM | POA: Diagnosis not present

## 2016-02-14 DIAGNOSIS — R531 Weakness: Secondary | ICD-10-CM | POA: Diagnosis not present

## 2016-02-14 DIAGNOSIS — M25461 Effusion, right knee: Secondary | ICD-10-CM | POA: Diagnosis not present

## 2016-02-15 DIAGNOSIS — M25461 Effusion, right knee: Secondary | ICD-10-CM | POA: Diagnosis not present

## 2016-02-18 DIAGNOSIS — R531 Weakness: Secondary | ICD-10-CM | POA: Diagnosis not present

## 2016-02-18 DIAGNOSIS — M25461 Effusion, right knee: Secondary | ICD-10-CM | POA: Diagnosis not present

## 2016-02-18 DIAGNOSIS — M25561 Pain in right knee: Secondary | ICD-10-CM | POA: Diagnosis not present

## 2016-02-18 DIAGNOSIS — M25661 Stiffness of right knee, not elsewhere classified: Secondary | ICD-10-CM | POA: Diagnosis not present

## 2016-02-19 ENCOUNTER — Other Ambulatory Visit: Payer: Self-pay | Admitting: Cardiology

## 2016-02-19 DIAGNOSIS — Z01810 Encounter for preprocedural cardiovascular examination: Secondary | ICD-10-CM

## 2016-02-19 DIAGNOSIS — I10 Essential (primary) hypertension: Secondary | ICD-10-CM

## 2016-02-20 DIAGNOSIS — Z85828 Personal history of other malignant neoplasm of skin: Secondary | ICD-10-CM | POA: Diagnosis not present

## 2016-02-20 DIAGNOSIS — R531 Weakness: Secondary | ICD-10-CM | POA: Diagnosis not present

## 2016-02-20 DIAGNOSIS — M25461 Effusion, right knee: Secondary | ICD-10-CM | POA: Diagnosis not present

## 2016-02-20 DIAGNOSIS — D485 Neoplasm of uncertain behavior of skin: Secondary | ICD-10-CM | POA: Diagnosis not present

## 2016-02-20 DIAGNOSIS — D2272 Melanocytic nevi of left lower limb, including hip: Secondary | ICD-10-CM | POA: Diagnosis not present

## 2016-02-20 DIAGNOSIS — L570 Actinic keratosis: Secondary | ICD-10-CM | POA: Diagnosis not present

## 2016-02-20 DIAGNOSIS — M25661 Stiffness of right knee, not elsewhere classified: Secondary | ICD-10-CM | POA: Diagnosis not present

## 2016-02-20 DIAGNOSIS — D224 Melanocytic nevi of scalp and neck: Secondary | ICD-10-CM | POA: Diagnosis not present

## 2016-02-20 DIAGNOSIS — L820 Inflamed seborrheic keratosis: Secondary | ICD-10-CM | POA: Diagnosis not present

## 2016-02-20 DIAGNOSIS — Z808 Family history of malignant neoplasm of other organs or systems: Secondary | ICD-10-CM | POA: Diagnosis not present

## 2016-02-20 DIAGNOSIS — L821 Other seborrheic keratosis: Secondary | ICD-10-CM | POA: Diagnosis not present

## 2016-02-20 DIAGNOSIS — M25561 Pain in right knee: Secondary | ICD-10-CM | POA: Diagnosis not present

## 2016-02-20 DIAGNOSIS — D2271 Melanocytic nevi of right lower limb, including hip: Secondary | ICD-10-CM | POA: Diagnosis not present

## 2016-02-22 DIAGNOSIS — M25461 Effusion, right knee: Secondary | ICD-10-CM | POA: Diagnosis not present

## 2016-02-22 DIAGNOSIS — M25661 Stiffness of right knee, not elsewhere classified: Secondary | ICD-10-CM | POA: Diagnosis not present

## 2016-02-22 DIAGNOSIS — M25561 Pain in right knee: Secondary | ICD-10-CM | POA: Diagnosis not present

## 2016-02-22 DIAGNOSIS — R531 Weakness: Secondary | ICD-10-CM | POA: Diagnosis not present

## 2016-02-25 DIAGNOSIS — M25561 Pain in right knee: Secondary | ICD-10-CM | POA: Diagnosis not present

## 2016-02-25 DIAGNOSIS — M25461 Effusion, right knee: Secondary | ICD-10-CM | POA: Diagnosis not present

## 2016-02-25 DIAGNOSIS — M25661 Stiffness of right knee, not elsewhere classified: Secondary | ICD-10-CM | POA: Diagnosis not present

## 2016-02-25 DIAGNOSIS — R531 Weakness: Secondary | ICD-10-CM | POA: Diagnosis not present

## 2016-02-27 DIAGNOSIS — M25661 Stiffness of right knee, not elsewhere classified: Secondary | ICD-10-CM | POA: Diagnosis not present

## 2016-02-27 DIAGNOSIS — R531 Weakness: Secondary | ICD-10-CM | POA: Diagnosis not present

## 2016-02-27 DIAGNOSIS — M25461 Effusion, right knee: Secondary | ICD-10-CM | POA: Diagnosis not present

## 2016-02-27 DIAGNOSIS — M25561 Pain in right knee: Secondary | ICD-10-CM | POA: Diagnosis not present

## 2016-02-29 DIAGNOSIS — M25661 Stiffness of right knee, not elsewhere classified: Secondary | ICD-10-CM | POA: Diagnosis not present

## 2016-02-29 DIAGNOSIS — R531 Weakness: Secondary | ICD-10-CM | POA: Diagnosis not present

## 2016-02-29 DIAGNOSIS — M25461 Effusion, right knee: Secondary | ICD-10-CM | POA: Diagnosis not present

## 2016-02-29 DIAGNOSIS — M25561 Pain in right knee: Secondary | ICD-10-CM | POA: Diagnosis not present

## 2016-03-03 DIAGNOSIS — R531 Weakness: Secondary | ICD-10-CM | POA: Diagnosis not present

## 2016-03-03 DIAGNOSIS — M25569 Pain in unspecified knee: Secondary | ICD-10-CM | POA: Diagnosis not present

## 2016-03-03 DIAGNOSIS — E559 Vitamin D deficiency, unspecified: Secondary | ICD-10-CM | POA: Diagnosis not present

## 2016-03-03 DIAGNOSIS — M25561 Pain in right knee: Secondary | ICD-10-CM | POA: Diagnosis not present

## 2016-03-03 DIAGNOSIS — M25661 Stiffness of right knee, not elsewhere classified: Secondary | ICD-10-CM | POA: Diagnosis not present

## 2016-03-03 DIAGNOSIS — I10 Essential (primary) hypertension: Secondary | ICD-10-CM | POA: Diagnosis not present

## 2016-03-03 DIAGNOSIS — E039 Hypothyroidism, unspecified: Secondary | ICD-10-CM | POA: Diagnosis not present

## 2016-03-03 DIAGNOSIS — M25461 Effusion, right knee: Secondary | ICD-10-CM | POA: Diagnosis not present

## 2016-03-03 DIAGNOSIS — Z1389 Encounter for screening for other disorder: Secondary | ICD-10-CM | POA: Diagnosis not present

## 2016-03-05 DIAGNOSIS — M25661 Stiffness of right knee, not elsewhere classified: Secondary | ICD-10-CM | POA: Diagnosis not present

## 2016-03-05 DIAGNOSIS — R531 Weakness: Secondary | ICD-10-CM | POA: Diagnosis not present

## 2016-03-05 DIAGNOSIS — M25561 Pain in right knee: Secondary | ICD-10-CM | POA: Diagnosis not present

## 2016-03-05 DIAGNOSIS — M25461 Effusion, right knee: Secondary | ICD-10-CM | POA: Diagnosis not present

## 2016-03-07 DIAGNOSIS — M25661 Stiffness of right knee, not elsewhere classified: Secondary | ICD-10-CM | POA: Diagnosis not present

## 2016-03-07 DIAGNOSIS — M25461 Effusion, right knee: Secondary | ICD-10-CM | POA: Diagnosis not present

## 2016-03-07 DIAGNOSIS — R531 Weakness: Secondary | ICD-10-CM | POA: Diagnosis not present

## 2016-03-07 DIAGNOSIS — M25561 Pain in right knee: Secondary | ICD-10-CM | POA: Diagnosis not present

## 2016-03-10 DIAGNOSIS — M25661 Stiffness of right knee, not elsewhere classified: Secondary | ICD-10-CM | POA: Diagnosis not present

## 2016-03-10 DIAGNOSIS — M25561 Pain in right knee: Secondary | ICD-10-CM | POA: Diagnosis not present

## 2016-03-10 DIAGNOSIS — M25461 Effusion, right knee: Secondary | ICD-10-CM | POA: Diagnosis not present

## 2016-03-10 DIAGNOSIS — R531 Weakness: Secondary | ICD-10-CM | POA: Diagnosis not present

## 2016-03-12 DIAGNOSIS — M25461 Effusion, right knee: Secondary | ICD-10-CM | POA: Diagnosis not present

## 2016-03-12 DIAGNOSIS — M25661 Stiffness of right knee, not elsewhere classified: Secondary | ICD-10-CM | POA: Diagnosis not present

## 2016-03-12 DIAGNOSIS — M25561 Pain in right knee: Secondary | ICD-10-CM | POA: Diagnosis not present

## 2016-03-12 DIAGNOSIS — R531 Weakness: Secondary | ICD-10-CM | POA: Diagnosis not present

## 2016-03-13 ENCOUNTER — Encounter (INDEPENDENT_AMBULATORY_CARE_PROVIDER_SITE_OTHER): Payer: Medicare Other | Admitting: Ophthalmology

## 2016-03-13 DIAGNOSIS — H353132 Nonexudative age-related macular degeneration, bilateral, intermediate dry stage: Secondary | ICD-10-CM

## 2016-03-13 DIAGNOSIS — M25661 Stiffness of right knee, not elsewhere classified: Secondary | ICD-10-CM | POA: Diagnosis not present

## 2016-03-13 DIAGNOSIS — M25461 Effusion, right knee: Secondary | ICD-10-CM | POA: Diagnosis not present

## 2016-03-13 DIAGNOSIS — I10 Essential (primary) hypertension: Secondary | ICD-10-CM

## 2016-03-13 DIAGNOSIS — H35033 Hypertensive retinopathy, bilateral: Secondary | ICD-10-CM

## 2016-03-13 DIAGNOSIS — H43813 Vitreous degeneration, bilateral: Secondary | ICD-10-CM | POA: Diagnosis not present

## 2016-03-13 DIAGNOSIS — R531 Weakness: Secondary | ICD-10-CM | POA: Diagnosis not present

## 2016-03-13 DIAGNOSIS — M25561 Pain in right knee: Secondary | ICD-10-CM | POA: Diagnosis not present

## 2016-03-14 DIAGNOSIS — M25661 Stiffness of right knee, not elsewhere classified: Secondary | ICD-10-CM | POA: Diagnosis not present

## 2016-03-17 DIAGNOSIS — M25461 Effusion, right knee: Secondary | ICD-10-CM | POA: Diagnosis not present

## 2016-03-17 DIAGNOSIS — M25661 Stiffness of right knee, not elsewhere classified: Secondary | ICD-10-CM | POA: Diagnosis not present

## 2016-03-17 DIAGNOSIS — M25561 Pain in right knee: Secondary | ICD-10-CM | POA: Diagnosis not present

## 2016-03-17 DIAGNOSIS — R531 Weakness: Secondary | ICD-10-CM | POA: Diagnosis not present

## 2016-03-21 DIAGNOSIS — M25461 Effusion, right knee: Secondary | ICD-10-CM | POA: Diagnosis not present

## 2016-03-21 DIAGNOSIS — M25561 Pain in right knee: Secondary | ICD-10-CM | POA: Diagnosis not present

## 2016-03-21 DIAGNOSIS — R531 Weakness: Secondary | ICD-10-CM | POA: Diagnosis not present

## 2016-03-21 DIAGNOSIS — M25661 Stiffness of right knee, not elsewhere classified: Secondary | ICD-10-CM | POA: Diagnosis not present

## 2016-03-25 DIAGNOSIS — M25561 Pain in right knee: Secondary | ICD-10-CM | POA: Diagnosis not present

## 2016-03-25 DIAGNOSIS — R531 Weakness: Secondary | ICD-10-CM | POA: Diagnosis not present

## 2016-03-25 DIAGNOSIS — M25461 Effusion, right knee: Secondary | ICD-10-CM | POA: Diagnosis not present

## 2016-03-25 DIAGNOSIS — M25661 Stiffness of right knee, not elsewhere classified: Secondary | ICD-10-CM | POA: Diagnosis not present

## 2016-03-27 DIAGNOSIS — M25461 Effusion, right knee: Secondary | ICD-10-CM | POA: Diagnosis not present

## 2016-03-27 DIAGNOSIS — M25561 Pain in right knee: Secondary | ICD-10-CM | POA: Diagnosis not present

## 2016-03-27 DIAGNOSIS — R531 Weakness: Secondary | ICD-10-CM | POA: Diagnosis not present

## 2016-03-27 DIAGNOSIS — M25661 Stiffness of right knee, not elsewhere classified: Secondary | ICD-10-CM | POA: Diagnosis not present

## 2016-03-31 DIAGNOSIS — M25661 Stiffness of right knee, not elsewhere classified: Secondary | ICD-10-CM | POA: Diagnosis not present

## 2016-03-31 DIAGNOSIS — R531 Weakness: Secondary | ICD-10-CM | POA: Diagnosis not present

## 2016-03-31 DIAGNOSIS — M25461 Effusion, right knee: Secondary | ICD-10-CM | POA: Diagnosis not present

## 2016-03-31 DIAGNOSIS — M25561 Pain in right knee: Secondary | ICD-10-CM | POA: Diagnosis not present

## 2016-04-03 DIAGNOSIS — M25461 Effusion, right knee: Secondary | ICD-10-CM | POA: Diagnosis not present

## 2016-04-03 DIAGNOSIS — R531 Weakness: Secondary | ICD-10-CM | POA: Diagnosis not present

## 2016-04-03 DIAGNOSIS — M25661 Stiffness of right knee, not elsewhere classified: Secondary | ICD-10-CM | POA: Diagnosis not present

## 2016-04-03 DIAGNOSIS — M25561 Pain in right knee: Secondary | ICD-10-CM | POA: Diagnosis not present

## 2016-04-07 DIAGNOSIS — M25461 Effusion, right knee: Secondary | ICD-10-CM | POA: Diagnosis not present

## 2016-04-07 DIAGNOSIS — M25561 Pain in right knee: Secondary | ICD-10-CM | POA: Diagnosis not present

## 2016-04-07 DIAGNOSIS — R531 Weakness: Secondary | ICD-10-CM | POA: Diagnosis not present

## 2016-04-07 DIAGNOSIS — M25661 Stiffness of right knee, not elsewhere classified: Secondary | ICD-10-CM | POA: Diagnosis not present

## 2016-04-10 DIAGNOSIS — Z23 Encounter for immunization: Secondary | ICD-10-CM | POA: Diagnosis not present

## 2016-04-11 DIAGNOSIS — M25461 Effusion, right knee: Secondary | ICD-10-CM | POA: Diagnosis not present

## 2016-04-16 DIAGNOSIS — M25461 Effusion, right knee: Secondary | ICD-10-CM | POA: Diagnosis not present

## 2016-04-16 DIAGNOSIS — M25561 Pain in right knee: Secondary | ICD-10-CM | POA: Diagnosis not present

## 2016-04-16 DIAGNOSIS — R531 Weakness: Secondary | ICD-10-CM | POA: Diagnosis not present

## 2016-04-16 DIAGNOSIS — M25661 Stiffness of right knee, not elsewhere classified: Secondary | ICD-10-CM | POA: Diagnosis not present

## 2016-05-05 DIAGNOSIS — M25461 Effusion, right knee: Secondary | ICD-10-CM | POA: Diagnosis not present

## 2016-05-05 DIAGNOSIS — M25561 Pain in right knee: Secondary | ICD-10-CM | POA: Diagnosis not present

## 2016-06-19 ENCOUNTER — Encounter (INDEPENDENT_AMBULATORY_CARE_PROVIDER_SITE_OTHER): Payer: Medicare Other | Admitting: Ophthalmology

## 2016-06-19 DIAGNOSIS — H35372 Puckering of macula, left eye: Secondary | ICD-10-CM | POA: Diagnosis not present

## 2016-06-19 DIAGNOSIS — I10 Essential (primary) hypertension: Secondary | ICD-10-CM

## 2016-06-19 DIAGNOSIS — H43813 Vitreous degeneration, bilateral: Secondary | ICD-10-CM | POA: Diagnosis not present

## 2016-06-19 DIAGNOSIS — H353132 Nonexudative age-related macular degeneration, bilateral, intermediate dry stage: Secondary | ICD-10-CM | POA: Diagnosis not present

## 2016-06-19 DIAGNOSIS — H35033 Hypertensive retinopathy, bilateral: Secondary | ICD-10-CM

## 2016-07-19 ENCOUNTER — Emergency Department (HOSPITAL_COMMUNITY): Payer: Medicare Other

## 2016-07-19 ENCOUNTER — Emergency Department (HOSPITAL_COMMUNITY)
Admission: EM | Admit: 2016-07-19 | Discharge: 2016-07-20 | Disposition: A | Discharge status: Home / Self Care | Payer: Medicare Other | Attending: Physician Assistant | Admitting: Physician Assistant

## 2016-07-19 ENCOUNTER — Encounter (HOSPITAL_COMMUNITY): Payer: Self-pay

## 2016-07-19 DIAGNOSIS — Z96611 Presence of right artificial shoulder joint: Secondary | ICD-10-CM | POA: Diagnosis not present

## 2016-07-19 DIAGNOSIS — I10 Essential (primary) hypertension: Secondary | ICD-10-CM | POA: Diagnosis not present

## 2016-07-19 DIAGNOSIS — Z96651 Presence of right artificial knee joint: Secondary | ICD-10-CM | POA: Diagnosis not present

## 2016-07-19 DIAGNOSIS — Z7901 Long term (current) use of anticoagulants: Secondary | ICD-10-CM | POA: Diagnosis not present

## 2016-07-19 DIAGNOSIS — E039 Hypothyroidism, unspecified: Secondary | ICD-10-CM | POA: Diagnosis not present

## 2016-07-19 DIAGNOSIS — R1012 Left upper quadrant pain: Secondary | ICD-10-CM | POA: Diagnosis not present

## 2016-07-19 DIAGNOSIS — K529 Noninfective gastroenteritis and colitis, unspecified: Secondary | ICD-10-CM | POA: Diagnosis not present

## 2016-07-19 DIAGNOSIS — R1013 Epigastric pain: Secondary | ICD-10-CM | POA: Diagnosis present

## 2016-07-19 DIAGNOSIS — Z7982 Long term (current) use of aspirin: Secondary | ICD-10-CM | POA: Diagnosis not present

## 2016-07-19 LAB — COMPREHENSIVE METABOLIC PANEL
ALT: 14 U/L (ref 14–54)
AST: 23 U/L (ref 15–41)
Albumin: 3.9 g/dL (ref 3.5–5.0)
Alkaline Phosphatase: 62 U/L (ref 38–126)
Anion gap: 11 (ref 5–15)
BUN: 35 mg/dL — AB (ref 6–20)
CHLORIDE: 100 mmol/L — AB (ref 101–111)
CO2: 29 mmol/L (ref 22–32)
CREATININE: 1.08 mg/dL — AB (ref 0.44–1.00)
Calcium: 10.2 mg/dL (ref 8.9–10.3)
GFR calc Af Amer: 51 mL/min — ABNORMAL LOW (ref >60)
GFR calc non Af Amer: 44 mL/min — ABNORMAL LOW (ref >60)
Glucose, Bld: 128 mg/dL — ABNORMAL HIGH (ref 65–99)
Potassium: 3.7 mmol/L (ref 3.5–5.1)
SODIUM: 140 mmol/L (ref 135–145)
Total Bilirubin: 0.9 mg/dL (ref 0.3–1.2)
Total Protein: 7.3 g/dL (ref 6.5–8.1)

## 2016-07-19 LAB — URINALYSIS, ROUTINE W REFLEX MICROSCOPIC
BILIRUBIN URINE: NEGATIVE
Bacteria, UA: NONE SEEN
Glucose, UA: NEGATIVE mg/dL
Hgb urine dipstick: NEGATIVE
Ketones, ur: NEGATIVE mg/dL
Nitrite: NEGATIVE
Protein, ur: NEGATIVE mg/dL
SPECIFIC GRAVITY, URINE: 1.019 (ref 1.005–1.030)
pH: 7 (ref 5.0–8.0)

## 2016-07-19 LAB — CBC
HCT: 38.5 % (ref 36.0–46.0)
Hemoglobin: 12.7 g/dL (ref 12.0–15.0)
MCH: 30.8 pg (ref 26.0–34.0)
MCHC: 33 g/dL (ref 30.0–36.0)
MCV: 93.4 fL (ref 78.0–100.0)
PLATELETS: 215 10*3/uL (ref 150–400)
RBC: 4.12 MIL/uL (ref 3.87–5.11)
RDW: 12.8 % (ref 11.5–15.5)
WBC: 9.4 10*3/uL (ref 4.0–10.5)

## 2016-07-19 LAB — LIPASE, BLOOD: LIPASE: 29 U/L (ref 11–51)

## 2016-07-19 LAB — I-STAT TROPONIN, ED: TROPONIN I, POC: 0 ng/mL (ref 0.00–0.08)

## 2016-07-19 MED ORDER — ONDANSETRON HCL 4 MG/2ML IJ SOLN
4.0000 mg | Freq: Once | INTRAMUSCULAR | Status: AC
  Administered 2016-07-19: 4 mg via INTRAVENOUS
  Filled 2016-07-19: qty 2

## 2016-07-19 MED ORDER — IOPAMIDOL (ISOVUE-300) INJECTION 61%
INTRAVENOUS | Status: AC
  Administered 2016-07-19: 100 mL
  Filled 2016-07-19: qty 100

## 2016-07-19 NOTE — ED Notes (Signed)
Patient transported to CT 

## 2016-07-19 NOTE — ED Provider Notes (Signed)
[facility] DEPT Provider Note   CSN: [id number] Arrival date & time: 07/19/16  [id number]     History   Chief Complaint Chief Complaint  Patient presents with  . Abdominal Pain    HPI Carla Taylor is a 82 y.o. female.  HPI  Patient is a very pleasant 82 year old female presenting with intermittent heaviness in her epigastric region. Patient reports this happened this afternoon. She reports it got worse. She went out to dinner with her friend to eat to see that made it better or worse. It did not change the effect. Patient has a heaviness in her epigastric region. She reports she had this when she had pancreatitis. However feels little bit differently now. Patient has no shortness of breath. Not exertionally related. Patient's pain comes and goes.  Patient has history of hypertension hyperlipidemia.  Past Medical History:  Diagnosis Date  . Arthritis   . Chronic shoulder pain   . Hyperlipidemia   . Hypertension   . Hypothyroid   . Macular degeneration   . Pancreatitis   . Primary localized osteoarthritis of right knee 02/05/2016  . 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 Shortness of breath dyspnea    on exertion  . Stress bladder incontinence, female     Patient Active Problem List   Diagnosis Date Noted  . Primary localized osteoarthritis of right knee 02/05/2016  . S/P total knee arthroplasty 02/05/2016  . 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 Preop cardiovascular exam 05/18/2014  . Edema 05/18/2014  . Hypokalemia 05/07/2014  . Morbid obesity ([place]) 05/06/2014  . Osteoarthritis, shoulder 05/06/2014  . Hypothyroidism 05/06/2014  . Acute pancreatitis 05/05/2014  . Essential hypertension 05/05/2014    Past Surgical History:  Procedure Laterality Date  . carpal tunnel Bilateral   . CATARACT EXTRACTION Bilateral    2005  . EYE SURGERY    . KNEE ARTHROSCOPY Bilateral    2001  . No previous surgeries    . TOTAL KNEE ARTHROPLASTY  Right 02/05/2016  . TOTAL KNEE ARTHROPLASTY Right 02/05/2016   Procedure: RIGHT TOTAL KNEE ARTHROPLASTY;  Surgeon: Marchia Bond, MD;  Location: [facility];  Service: Orthopedics;  Laterality: Right;  . TOTAL SHOULDER ARTHROPLASTY Right 09/19/2014   Procedure: RIGHT TOTAL SHOULDER ARTHROPLASTY;  Surgeon: Johnny Bridge, MD;  Location: [facility];  Service: Orthopedics;  Laterality: Right;    OB History    No data available       Home Medications    Prior to Admission medications   Medication Sig Start Date End Date Taking? Authorizing Provider  amLODipine (NORVASC) 5 MG tablet Take 5 mg by mouth daily.  12/27/15  Yes Historical Provider, MD  aspirin EC 81 MG tablet Take 81 mg by mouth daily.   Yes Historical Provider, MD  calcium carbonate (OS-CAL - DOSED IN MG OF ELEMENTAL CALCIUM) 1250 MG tablet Take 1,250 mg by mouth 2 (two) times daily with a meal.    Yes Historical Provider, MD  doxazosin (CARDURA) 2 MG tablet TAKE 1 TABLET BY MOUTH  DAILY  PLEASE OBTAIN FUTURE  REFILLS FROM PCP. 02/19/16  Yes Lelon Perla, MD  hydrochlorothiazide (HYDRODIURIL) 25 MG tablet Take 25 mg by mouth daily.  04/29/14  Yes Historical Provider, MD  levothyroxine (SYNTHROID, LEVOTHROID) 88 MCG tablet Take 88 mcg by mouth daily before breakfast.   Yes Historical Provider, MD  lisinopril (PRINIVIL,ZESTRIL) 40 MG tablet TAKE 1 TABLET BY MOUTH  DAILY 02/19/16  Yes Lelon Perla, MD  metoprolol succinate (TOPROL-XL) 50 MG 24 hr tablet TAKE ONE AND ONE HALF TABLETS ONCE DAILY 11/13/15  Yes Lelon Perla, MD  Multiple Vitamins-Minerals (ICAPS AREDS FORMULA PO) Take 1 tablet by mouth 2 (two) times daily.   Yes Historical Provider, MD  sennosides-docusate sodium (SENOKOT-S) 8.6-50 MG tablet Take 2 tablets by mouth daily. Patient taking differently: Take 2 tablets by mouth daily as needed for constipation.  09/19/14  Yes Marchia Bond, MD  vitamin B-12 (CYANOCOBALAMIN) 500 MCG tablet Take 500 mcg by mouth daily.   Yes Historical  Provider, MD  Vitamin D, Ergocalciferol, (DRISDOL) 50000 UNITS CAPS capsule Take 50,000 Units by mouth every Sunday.   Yes Historical Provider, MD  baclofen (LIORESAL) 10 MG tablet Take 1 tablet (10 mg total) by mouth 3 (three) times daily. As needed for muscle spasm Patient not taking: Reported on 07/19/2016 02/05/16   Marchia Bond, MD  HYDROcodone-acetaminophen [facility]) 10-325 MG tablet Take 1-2 tablets by mouth every 6 (six) hours as needed. Patient not taking: Reported on 07/19/2016 02/05/16   Marchia Bond, MD  rivaroxaban (XARELTO) 10 MG TABS tablet Take 1 tablet (10 mg total) by mouth daily. Patient not taking: Reported on 07/19/2016 02/05/16   Marchia Bond, MD  sennosides-docusate sodium (SENOKOT-S) 8.6-50 MG tablet Take 2 tablets by mouth daily. Patient not taking: Reported on 07/19/2016 02/05/16   Marchia Bond, MD    Family History Family History  Problem Relation Age of Onset  . CVA Mother   . CVA Father     Social History Social History  Substance Use Topics  . Smoking status: Never Smoker  . Smokeless tobacco: Never Used  . Alcohol use No     Allergies   No known allergies   Review of Systems Review of Systems  Constitutional: Negative for fatigue and fever.  Respiratory: Negative for choking, chest tightness and shortness of breath.   Cardiovascular: Negative for chest pain, palpitations and leg swelling.  Gastrointestinal: Positive for abdominal pain. Negative for constipation, nausea and vomiting.  All other systems reviewed and are negative.    Physical Exam Updated Vital Signs BP 108/97   Pulse 73   Temp 98 F (36.7 C) (Oral)   Resp 14   Ht 5' 2.5" (1.588 m)   Wt 180 lb (81.6 kg)   SpO2 98%   BMI 32.40 kg/m   Physical Exam  Constitutional: She is oriented to person, place, and time. She appears well-developed and well-nourished.  HENT:  Head: Normocephalic and atraumatic.  Eyes: Right eye exhibits no discharge.  Cardiovascular: Normal rate,  regular rhythm and normal heart sounds.   No murmur heard. Pulmonary/Chest: Effort normal and breath sounds normal. She has no wheezes. She has no rales.  Abdominal: Soft. She exhibits no distension.  ? Minimal pain with deep aplapiton of LUQ/epigastric region  Neurological: She is oriented to person, place, and time.  Skin: Skin is warm and dry. She is not diaphoretic.  Psychiatric: She has a normal mood and affect.  Nursing note and vitals reviewed.    ED Treatments / Results  Labs (all labs ordered are listed, but only abnormal results are displayed) Labs Reviewed  COMPREHENSIVE METABOLIC PANEL - Abnormal; Notable for the following:       Result Value   Chloride 100 (*)    Glucose, Bld 128 (*)    BUN 35 (*)    Creatinine, Ser 1.08 (*)    GFR calc non Af Amer 44 (*)    GFR  calc Af Amer 51 (*)    All other components within normal limits  URINALYSIS, ROUTINE W REFLEX MICROSCOPIC - Abnormal; Notable for the following:    APPearance CLOUDY (*)    Leukocytes, UA MODERATE (*)    Squamous Epithelial / LPF 0-5 (*)    All other components within normal limits  LIPASE, BLOOD  CBC  I-STAT TROPOININ, ED  I-STAT TROPOININ, ED    EKG  EKG Interpretation  Date/Time:  Saturday July 19 2016 21:27:35 EST Ventricular Rate:  66 PR Interval:    QRS Duration: 82 QT Interval:  420 QTC Calculation: 440 R Axis:   44 Text Interpretation:  Sinus rhythm Anteroseptal infarct, age indeterminate Normal sinus rhythm Confirmed by Gerald Leitz ([id number]) on 07/19/2016 9:33:42 PM       Radiology Ct Abdomen Pelvis W Contrast  Result Date: 07/19/2016 CLINICAL DATA:  Progressive epigastric and left upper quadrant pain. History pancreatitis. EXAM: CT ABDOMEN AND PELVIS WITH CONTRAST TECHNIQUE: Multidetector CT imaging of the abdomen and pelvis was performed using the standard protocol following bolus administration of intravenous contrast. CONTRAST:  124mL ISOVUE-300 IOPAMIDOL (ISOVUE-300)  INJECTION 61% COMPARISON:  CT 05/05/2014 FINDINGS: Lower chest: Descending aortic atherosclerosis. Coronary artery calcifications. Lingular scarring is unchanged. No consolidation or pleural fluid. Hepatobiliary: No focal hepatic lesion. Gallbladder is decompressed. No biliary dilatation. Pancreas: No peripancreatic inflammation to suggest pancreatitis. No ductal dilatation. No peripancreatic fluid collection. Spleen: Normal in size without focal abnormality. Adrenals/Urinary Tract: Normal adrenal glands. Thinning of both renal parenchyma without hydronephrosis. Nonobstructing 10 mm calculus in the mid right kidney. Mild nonspecific perinephric edema, stable. Symmetric enhancement and excretion on delayed phase imaging. Urinary bladder is physiologically distended without wall thickening. Stomach/Bowel: Small hiatal hernia. Stomach distended with ingested contents, no gastric wall thickening. Duodenum is decompressed. Jejunal bowel loops in the central and left upper abdomen are dilated and fluid-filled with mild mesenteric edema. There is general transition to decompressed bowel loops in the midline lower abdomen, without abrupt transition to suggest obstruction. There is mild fecalization of distal small bowel contents. No pneumatosis. Small to moderate stool burden with no colonic inflammation. Minimal diverticulosis of the distal descending and sigmoid colon without acute inflammation. The appendix is normal. Vascular/Lymphatic: Atherosclerosis of the abdominal aorta and its branches. No aneurysm. No acute vascular abnormality. No mesenteric or portal venous gas. No adenopathy. Reproductive: Coarse uterine calcification may be fibroid, otherwise normal atrophic appearance. No adnexal mass. Other: Minimal free fluid in the pelvis is likely reactive. There is no free air. Small fat containing umbilical hernia. Musculoskeletal: Multilevel degenerative change throughout the lumbar spine. Degenerative change of both  hips. There are no acute or suspicious osseous abnormalities. IMPRESSION: 1. Dilated fluid-filled stomach and small bowel, in a pattern consistent with enteritis or less likely ileus. No bowel obstruction at this time. 2. Nonobstructing right renal stone. 3. No pancreatic inflammation to suggest pancreatitis. 4. Advanced atherosclerosis. Sigmoid colonic diverticulosis without inflammation. Electronically Signed   By: Jeb Levering M.D.   On: 07/19/2016 22:16    Procedures Procedures (including critical care time)  Medications Ordered in ED Medications  iopamidol (ISOVUE-300) 61 % injection (100 mLs  Contrast Given 07/19/16 2144)  ondansetron (ZOFRAN) injection 4 mg (4 mg Intravenous Given 07/19/16 2319)     Initial Impression / Assessment and Plan / ED Course  I have reviewed the triage vital signs and the nursing notes.  Pertinent labs & imaging results that were available during my care of the patient were reviewed  by me and considered in my medical decision making (see chart for details).  Clinical Course    Patient is very well-appearing 82 year old female history of hypertension hyperlipidemia and pancreatitis presenting today with abdominal heaviness. Patient reports this is intermittent. Not changed with food. She has no nausea no vomiting. Patient states it feels mildly similar to her pancreatitis. However currently patient's lipase is negative. It is also not associated with any nausea or vomiting which atypical for pancreatitis. This could represent  pancreatitis versus cardiac ischemia  Get CT abdomen to see if there is early evidence of inflammation around the head of the pancreas. Additionally will get  troponin and initial EKG..  CT shows enteritis. No reason to think ileus or obstruction given presenting symtpoms and no vomiting.   Patient vomited one time here. Normal vitals, PE and able to take PO.  Will discharge with follow up with pcp with any changes.  Return  precuations expressed to patient and friend,  Final Clinical Impressions(s) / ED Diagnoses   Final diagnoses:  None    New Prescriptions New Prescriptions   No medications on file     Raeden Schippers Julio Alm, MD 07/19/16 2348

## 2016-07-19 NOTE — ED Triage Notes (Signed)
Onset this morning "nagging"  Pain in mid abdominal area.  After eating fried fish for supper, pain has increased and is constant. No other s/s noted.

## 2016-07-20 DIAGNOSIS — K529 Noninfective gastroenteritis and colitis, unspecified: Secondary | ICD-10-CM | POA: Diagnosis not present

## 2016-07-20 LAB — I-STAT TROPONIN, ED: TROPONIN I, POC: 0 ng/mL (ref 0.00–0.08)

## 2016-07-20 MED ORDER — ONDANSETRON HCL 4 MG PO TABS: 4.0000 mg | ORAL_TABLET | Freq: Three times a day (TID) | ORAL | 0 refills | Status: DC | PRN

## 2016-07-20 NOTE — Discharge Instructions (Signed)
You were seen today with abdominal pain. Your CT showed enteritis. This is likely viral. You should takes Zofran to help with any nausea. Otherwise drink plenty of fluids, stay hydrated. If you are unable to keep down fluids please return immediately to the emergency department. If you or vomiting profusely, or not passing flatus or stooling please return immediately.

## 2016-07-20 NOTE — ED Notes (Signed)
Pt able to drink water without any N/V

## 2016-07-25 DIAGNOSIS — I10 Essential (primary) hypertension: Secondary | ICD-10-CM | POA: Diagnosis not present

## 2016-07-25 DIAGNOSIS — R9431 Abnormal electrocardiogram [ECG] [EKG]: Secondary | ICD-10-CM | POA: Diagnosis not present

## 2016-07-25 DIAGNOSIS — E559 Vitamin D deficiency, unspecified: Secondary | ICD-10-CM | POA: Diagnosis not present

## 2016-07-25 DIAGNOSIS — E039 Hypothyroidism, unspecified: Secondary | ICD-10-CM | POA: Diagnosis not present

## 2016-07-25 DIAGNOSIS — M25569 Pain in unspecified knee: Secondary | ICD-10-CM | POA: Diagnosis not present

## 2016-07-25 DIAGNOSIS — K529 Noninfective gastroenteritis and colitis, unspecified: Secondary | ICD-10-CM | POA: Diagnosis not present

## 2016-08-06 ENCOUNTER — Other Ambulatory Visit: Payer: Self-pay | Admitting: Cardiology

## 2016-08-06 DIAGNOSIS — I10 Essential (primary) hypertension: Secondary | ICD-10-CM

## 2016-08-06 DIAGNOSIS — Z01810 Encounter for preprocedural cardiovascular examination: Secondary | ICD-10-CM

## 2016-08-08 NOTE — Telephone Encounter (Signed)
Rx has been sent to the pharmacy electronically. ° °

## 2016-09-04 DIAGNOSIS — Z0000 Encounter for general adult medical examination without abnormal findings: Secondary | ICD-10-CM | POA: Diagnosis not present

## 2016-09-04 DIAGNOSIS — Z13820 Encounter for screening for osteoporosis: Secondary | ICD-10-CM | POA: Diagnosis not present

## 2016-09-04 DIAGNOSIS — Z23 Encounter for immunization: Secondary | ICD-10-CM | POA: Diagnosis not present

## 2016-09-04 DIAGNOSIS — Z1389 Encounter for screening for other disorder: Secondary | ICD-10-CM | POA: Diagnosis not present

## 2016-09-04 DIAGNOSIS — R32 Unspecified urinary incontinence: Secondary | ICD-10-CM | POA: Diagnosis not present

## 2016-09-04 DIAGNOSIS — M1611 Unilateral primary osteoarthritis, right hip: Secondary | ICD-10-CM | POA: Diagnosis not present

## 2016-09-04 DIAGNOSIS — E039 Hypothyroidism, unspecified: Secondary | ICD-10-CM | POA: Diagnosis not present

## 2016-09-04 DIAGNOSIS — E559 Vitamin D deficiency, unspecified: Secondary | ICD-10-CM | POA: Diagnosis not present

## 2016-09-04 DIAGNOSIS — I10 Essential (primary) hypertension: Secondary | ICD-10-CM | POA: Diagnosis not present

## 2016-09-12 DIAGNOSIS — L821 Other seborrheic keratosis: Secondary | ICD-10-CM | POA: Diagnosis not present

## 2016-09-12 DIAGNOSIS — D224 Melanocytic nevi of scalp and neck: Secondary | ICD-10-CM | POA: Diagnosis not present

## 2016-09-12 DIAGNOSIS — D2271 Melanocytic nevi of right lower limb, including hip: Secondary | ICD-10-CM | POA: Diagnosis not present

## 2016-09-12 DIAGNOSIS — Z23 Encounter for immunization: Secondary | ICD-10-CM | POA: Diagnosis not present

## 2016-09-12 DIAGNOSIS — L570 Actinic keratosis: Secondary | ICD-10-CM | POA: Diagnosis not present

## 2016-09-12 DIAGNOSIS — Z808 Family history of malignant neoplasm of other organs or systems: Secondary | ICD-10-CM | POA: Diagnosis not present

## 2016-09-12 DIAGNOSIS — D2272 Melanocytic nevi of left lower limb, including hip: Secondary | ICD-10-CM | POA: Diagnosis not present

## 2016-09-12 DIAGNOSIS — Z85828 Personal history of other malignant neoplasm of skin: Secondary | ICD-10-CM | POA: Diagnosis not present

## 2016-10-16 DIAGNOSIS — Z13820 Encounter for screening for osteoporosis: Secondary | ICD-10-CM | POA: Diagnosis not present

## 2016-10-16 DIAGNOSIS — Z780 Asymptomatic menopausal state: Secondary | ICD-10-CM | POA: Diagnosis not present

## 2016-10-16 DIAGNOSIS — M8588 Other specified disorders of bone density and structure, other site: Secondary | ICD-10-CM | POA: Diagnosis not present

## 2016-10-21 ENCOUNTER — Other Ambulatory Visit: Payer: Self-pay

## 2016-10-21 DIAGNOSIS — Z01810 Encounter for preprocedural cardiovascular examination: Secondary | ICD-10-CM

## 2016-10-21 DIAGNOSIS — I10 Essential (primary) hypertension: Secondary | ICD-10-CM

## 2016-10-21 MED ORDER — METOPROLOL SUCCINATE ER 50 MG PO TB24: ORAL_TABLET | ORAL | 0 refills | Status: DC

## 2016-12-01 DIAGNOSIS — Z1231 Encounter for screening mammogram for malignant neoplasm of breast: Secondary | ICD-10-CM | POA: Diagnosis not present

## 2016-12-22 ENCOUNTER — Ambulatory Visit (INDEPENDENT_AMBULATORY_CARE_PROVIDER_SITE_OTHER): Payer: Medicare Other | Admitting: Ophthalmology

## 2016-12-22 DIAGNOSIS — H353132 Nonexudative age-related macular degeneration, bilateral, intermediate dry stage: Secondary | ICD-10-CM | POA: Diagnosis not present

## 2016-12-22 DIAGNOSIS — H35372 Puckering of macula, left eye: Secondary | ICD-10-CM | POA: Diagnosis not present

## 2016-12-22 DIAGNOSIS — I10 Essential (primary) hypertension: Secondary | ICD-10-CM

## 2016-12-22 DIAGNOSIS — H43813 Vitreous degeneration, bilateral: Secondary | ICD-10-CM | POA: Diagnosis not present

## 2016-12-22 DIAGNOSIS — H35033 Hypertensive retinopathy, bilateral: Secondary | ICD-10-CM

## 2016-12-25 ENCOUNTER — Other Ambulatory Visit: Payer: Self-pay

## 2016-12-25 DIAGNOSIS — I10 Essential (primary) hypertension: Secondary | ICD-10-CM

## 2016-12-25 DIAGNOSIS — Z01810 Encounter for preprocedural cardiovascular examination: Secondary | ICD-10-CM

## 2016-12-30 ENCOUNTER — Other Ambulatory Visit: Payer: Self-pay

## 2016-12-30 DIAGNOSIS — I10 Essential (primary) hypertension: Secondary | ICD-10-CM

## 2016-12-30 DIAGNOSIS — Z01810 Encounter for preprocedural cardiovascular examination: Secondary | ICD-10-CM

## 2016-12-30 MED ORDER — METOPROLOL SUCCINATE ER 50 MG PO TB24: ORAL_TABLET | ORAL | 0 refills | Status: DC

## 2017-02-02 ENCOUNTER — Encounter (INDEPENDENT_AMBULATORY_CARE_PROVIDER_SITE_OTHER): Payer: Medicare Other | Admitting: Ophthalmology

## 2017-02-02 DIAGNOSIS — H35033 Hypertensive retinopathy, bilateral: Secondary | ICD-10-CM

## 2017-02-02 DIAGNOSIS — H43813 Vitreous degeneration, bilateral: Secondary | ICD-10-CM

## 2017-02-02 DIAGNOSIS — H353132 Nonexudative age-related macular degeneration, bilateral, intermediate dry stage: Secondary | ICD-10-CM

## 2017-02-02 DIAGNOSIS — I10 Essential (primary) hypertension: Secondary | ICD-10-CM

## 2017-02-02 DIAGNOSIS — H35372 Puckering of macula, left eye: Secondary | ICD-10-CM

## 2017-02-12 ENCOUNTER — Other Ambulatory Visit: Payer: Self-pay | Admitting: Cardiology

## 2017-02-12 DIAGNOSIS — I10 Essential (primary) hypertension: Secondary | ICD-10-CM

## 2017-02-12 DIAGNOSIS — Z01810 Encounter for preprocedural cardiovascular examination: Secondary | ICD-10-CM

## 2017-02-16 DIAGNOSIS — Z96651 Presence of right artificial knee joint: Secondary | ICD-10-CM | POA: Diagnosis not present

## 2017-02-16 DIAGNOSIS — M545 Low back pain: Secondary | ICD-10-CM | POA: Diagnosis not present

## 2017-03-05 DIAGNOSIS — E559 Vitamin D deficiency, unspecified: Secondary | ICD-10-CM | POA: Diagnosis not present

## 2017-03-05 DIAGNOSIS — E039 Hypothyroidism, unspecified: Secondary | ICD-10-CM | POA: Diagnosis not present

## 2017-03-05 DIAGNOSIS — Z6836 Body mass index (BMI) 36.0-36.9, adult: Secondary | ICD-10-CM | POA: Diagnosis not present

## 2017-03-05 DIAGNOSIS — R32 Unspecified urinary incontinence: Secondary | ICD-10-CM | POA: Diagnosis not present

## 2017-03-05 DIAGNOSIS — I10 Essential (primary) hypertension: Secondary | ICD-10-CM | POA: Diagnosis not present

## 2017-03-05 DIAGNOSIS — N183 Chronic kidney disease, stage 3 (moderate): Secondary | ICD-10-CM | POA: Diagnosis not present

## 2017-03-05 DIAGNOSIS — E7800 Pure hypercholesterolemia, unspecified: Secondary | ICD-10-CM | POA: Diagnosis not present

## 2017-03-05 DIAGNOSIS — M1611 Unilateral primary osteoarthritis, right hip: Secondary | ICD-10-CM | POA: Diagnosis not present

## 2017-03-30 DIAGNOSIS — M545 Low back pain: Secondary | ICD-10-CM | POA: Diagnosis not present

## 2017-04-02 ENCOUNTER — Other Ambulatory Visit: Payer: Self-pay | Admitting: Cardiology

## 2017-04-02 DIAGNOSIS — Z01810 Encounter for preprocedural cardiovascular examination: Secondary | ICD-10-CM

## 2017-04-02 DIAGNOSIS — I10 Essential (primary) hypertension: Secondary | ICD-10-CM

## 2017-04-02 MED ORDER — METOPROLOL SUCCINATE ER 50 MG PO TB24: ORAL_TABLET | ORAL | 0 refills | Status: DC

## 2017-04-02 NOTE — Telephone Encounter (Signed)
Rx(s) sent to pharmacy electronically. Patient needs appt w/MD Staff message sent to scheduling pool to contact patient

## 2017-04-27 DIAGNOSIS — M545 Low back pain: Secondary | ICD-10-CM | POA: Diagnosis not present

## 2017-05-07 DIAGNOSIS — Z23 Encounter for immunization: Secondary | ICD-10-CM | POA: Diagnosis not present

## 2017-06-05 DIAGNOSIS — E559 Vitamin D deficiency, unspecified: Secondary | ICD-10-CM | POA: Diagnosis not present

## 2017-06-05 DIAGNOSIS — E039 Hypothyroidism, unspecified: Secondary | ICD-10-CM | POA: Diagnosis not present

## 2017-06-05 DIAGNOSIS — M25511 Pain in right shoulder: Secondary | ICD-10-CM | POA: Diagnosis not present

## 2017-06-05 DIAGNOSIS — E7800 Pure hypercholesterolemia, unspecified: Secondary | ICD-10-CM | POA: Diagnosis not present

## 2017-06-05 DIAGNOSIS — Z6836 Body mass index (BMI) 36.0-36.9, adult: Secondary | ICD-10-CM | POA: Diagnosis not present

## 2017-06-05 DIAGNOSIS — M1611 Unilateral primary osteoarthritis, right hip: Secondary | ICD-10-CM | POA: Diagnosis not present

## 2017-06-05 DIAGNOSIS — N183 Chronic kidney disease, stage 3 (moderate): Secondary | ICD-10-CM | POA: Diagnosis not present

## 2017-06-05 DIAGNOSIS — I10 Essential (primary) hypertension: Secondary | ICD-10-CM | POA: Diagnosis not present

## 2017-06-05 DIAGNOSIS — R32 Unspecified urinary incontinence: Secondary | ICD-10-CM | POA: Diagnosis not present

## 2017-06-08 DIAGNOSIS — Z96651 Presence of right artificial knee joint: Secondary | ICD-10-CM | POA: Diagnosis not present

## 2017-06-08 DIAGNOSIS — M25511 Pain in right shoulder: Secondary | ICD-10-CM | POA: Diagnosis not present

## 2017-06-08 DIAGNOSIS — M542 Cervicalgia: Secondary | ICD-10-CM | POA: Diagnosis not present

## 2017-07-06 NOTE — Progress Notes (Signed)
HPI: FU hypertension and edema. Echocardiogram November 2015 showed vigorous LV function, grade 1 diastolic dysfunction, mild mitral regurgitation and mild tricuspid regurgitation. Mildly elevated pulmonary pressures. Nuclear study November 2015 showed an ejection fraction of 71% and normal perfusion. Note amlodipine previously DCed due to pedal edema. Since I last saw her, the patient has dyspnea with more extreme activities but not with routine activities. It is relieved with rest. It is not associated with chest pain. There is no orthopnea, PND or pedal edema. There is no syncope or palpitations. There is no exertional chest pain.   Current Outpatient Medications  Medication Sig Dispense Refill  . amLODipine (NORVASC) 5 MG tablet Take 5 mg by mouth daily.     Marland Kitchen aspirin EC 81 MG tablet Take 81 mg by mouth daily.    . baclofen (LIORESAL) 10 MG tablet Take 1 tablet (10 mg total) by mouth 3 (three) times daily. As needed for muscle spasm 50 tablet 0  . calcium carbonate (OS-CAL - DOSED IN MG OF ELEMENTAL CALCIUM) 1250 MG tablet Take 1,250 mg by mouth 2 (two) times daily with a meal.     . doxazosin (CARDURA) 2 MG tablet TAKE 1 TABLET BY MOUTH  DAILY  PLEASE OBTAIN FUTURE  REFILLS FROM PCP. 90 tablet 0  . hydrochlorothiazide (HYDRODIURIL) 25 MG tablet Take 25 mg by mouth daily.     Marland Kitchen HYDROcodone-acetaminophen (NORCO) 10-325 MG tablet Take 1-2 tablets by mouth every 6 (six) hours as needed. 50 tablet 0  . levothyroxine (SYNTHROID, LEVOTHROID) 88 MCG tablet Take 88 mcg by mouth daily before breakfast.    . lisinopril (PRINIVIL,ZESTRIL) 40 MG tablet TAKE 1 TABLET BY MOUTH  DAILY 90 tablet 0  . metoprolol succinate (TOPROL-XL) 50 MG 24 hr tablet Take 1 and 1/2 tablets by mouth daily. <PLEASE MAKE APPOINTMENT FOR REFILLS> 45 tablet 0  . Multiple Vitamins-Minerals (ICAPS AREDS FORMULA PO) Take 1 tablet by mouth 2 (two) times daily.    . ondansetron (ZOFRAN) 4 MG tablet Take 1 tablet (4 mg total) by  mouth every 8 (eight) hours as needed for nausea or vomiting. 11 tablet 0  . rivaroxaban (XARELTO) 10 MG TABS tablet Take 1 tablet (10 mg total) by mouth daily. 21 tablet 0  . sennosides-docusate sodium (SENOKOT-S) 8.6-50 MG tablet Take 2 tablets by mouth daily. (Patient taking differently: Take 2 tablets by mouth daily as needed for constipation. ) 30 tablet 1  . sennosides-docusate sodium (SENOKOT-S) 8.6-50 MG tablet Take 2 tablets by mouth daily. 30 tablet 1  . vitamin B-12 (CYANOCOBALAMIN) 500 MCG tablet Take 500 mcg by mouth daily.    . Vitamin D, Ergocalciferol, (DRISDOL) 50000 UNITS CAPS capsule Take 50,000 Units by mouth every Sunday.     No current facility-administered medications for this visit.    Facility-Administered Medications Ordered in Other Visits  Medication Dose Route Frequency Provider Last Rate Last Dose  . vancomycin (VANCOCIN) 1,000 mg in sodium chloride 0.9 % 500 mL IVPB  1,000 mg Intravenous Once Marchia Bond, MD         Past Medical History:  Diagnosis Date  . Arthritis   . Chronic shoulder pain   . Hyperlipidemia   . Hypertension   . Hypothyroid   . Macular degeneration   . Pancreatitis   . Primary localized osteoarthritis of right knee 02/05/2016  . 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 Shortness of breath dyspnea    on exertion  . Stress  bladder incontinence, female     Past Surgical History:  Procedure Laterality Date  . carpal tunnel Bilateral   . CATARACT EXTRACTION Bilateral    2005  . EYE SURGERY    . KNEE ARTHROSCOPY Bilateral    2001  . No previous surgeries    . TOTAL KNEE ARTHROPLASTY Right 02/05/2016  . TOTAL KNEE ARTHROPLASTY Right 02/05/2016   Procedure: RIGHT TOTAL KNEE ARTHROPLASTY;  Surgeon: Marchia Bond, MD;  Location: [facility];  Service: Orthopedics;  Laterality: Right;  . TOTAL SHOULDER ARTHROPLASTY Right 09/19/2014   Procedure: RIGHT TOTAL SHOULDER ARTHROPLASTY;  Surgeon: Johnny Bridge, MD;  Location: [facility];   Service: Orthopedics;  Laterality: Right;    Social History   Socioeconomic History  . Marital status: Widowed    Spouse name: Not on file  . Number of children: 4  . Years of education: Not on file  . Highest education level: Not on file  Social Needs  . Financial resource strain: Not on file  . Food insecurity - worry: Not on file  . Food insecurity - inability: Not on file  . Transportation needs - medical: Not on file  . Transportation needs - non-medical: Not on file  Occupational History    Employer: [employer]  Tobacco Use  . Smoking status: Never Smoker  . Smokeless tobacco: Never Used  Substance and Sexual Activity  . Alcohol use: No    Alcohol/week: 0.0 oz  . Drug use: No  . Sexual activity: No  Other Topics Concern  . Not on file  Social History Narrative  . Not on file    Family History  Problem Relation Age of Onset  . CVA Mother   . CVA Father     ROS: knee arthralgias but no fevers or chills, productive cough, hemoptysis, dysphasia, odynophagia, melena, hematochezia, dysuria, hematuria, rash, seizure activity, orthopnea, PND, pedal edema, claudication. Remaining systems are negative.  Physical Exam: Well-developed obese in no acute distress.  Skin is warm and dry.  HEENT is normal.  Neck is supple.  Chest is clear to auscultation with normal expansion.  Cardiovascular exam is regular rate and rhythm.  Abdominal exam nontender or distended. No masses palpated. Extremities show no edema. neuro grossly intact  ECG- sinus rhythm at a rate of 68. No ST changes. personally reviewed  A/P  1 hypertension-blood pressure is borderline but reasonable for pts age. Continue present medications and follow; advance regimen as needed.  2 Pedal edema-resolved. No further evaluation.  3 Hyperlipidemia-per IM  Kirk Ruths, MD

## 2017-07-16 ENCOUNTER — Ambulatory Visit (INDEPENDENT_AMBULATORY_CARE_PROVIDER_SITE_OTHER): Payer: Medicare Other | Admitting: Cardiology

## 2017-07-16 ENCOUNTER — Encounter: Payer: Self-pay | Admitting: Cardiology

## 2017-07-16 VITALS — BP 150/60 | HR 68 | Ht 62.5 in | Wt 199.0 lb

## 2017-07-16 DIAGNOSIS — E7800 Pure hypercholesterolemia, unspecified: Secondary | ICD-10-CM | POA: Diagnosis not present

## 2017-07-16 DIAGNOSIS — I10 Essential (primary) hypertension: Secondary | ICD-10-CM | POA: Diagnosis not present

## 2017-07-16 NOTE — Patient Instructions (Signed)
Your physician recommends that you schedule a follow-up appointment in: AS NEEDED  

## 2017-09-23 DIAGNOSIS — D485 Neoplasm of uncertain behavior of skin: Secondary | ICD-10-CM | POA: Diagnosis not present

## 2017-09-23 DIAGNOSIS — D2271 Melanocytic nevi of right lower limb, including hip: Secondary | ICD-10-CM | POA: Diagnosis not present

## 2017-09-23 DIAGNOSIS — D2262 Melanocytic nevi of left upper limb, including shoulder: Secondary | ICD-10-CM | POA: Diagnosis not present

## 2017-09-23 DIAGNOSIS — D224 Melanocytic nevi of scalp and neck: Secondary | ICD-10-CM | POA: Diagnosis not present

## 2017-09-23 DIAGNOSIS — L570 Actinic keratosis: Secondary | ICD-10-CM | POA: Diagnosis not present

## 2017-09-23 DIAGNOSIS — L821 Other seborrheic keratosis: Secondary | ICD-10-CM | POA: Diagnosis not present

## 2017-09-23 DIAGNOSIS — D2272 Melanocytic nevi of left lower limb, including hip: Secondary | ICD-10-CM | POA: Diagnosis not present

## 2017-09-23 DIAGNOSIS — Z85828 Personal history of other malignant neoplasm of skin: Secondary | ICD-10-CM | POA: Diagnosis not present

## 2017-10-08 DIAGNOSIS — I10 Essential (primary) hypertension: Secondary | ICD-10-CM | POA: Diagnosis not present

## 2017-10-08 DIAGNOSIS — M1712 Unilateral primary osteoarthritis, left knee: Secondary | ICD-10-CM | POA: Diagnosis not present

## 2017-10-08 DIAGNOSIS — M1611 Unilateral primary osteoarthritis, right hip: Secondary | ICD-10-CM | POA: Diagnosis not present

## 2017-10-08 DIAGNOSIS — N183 Chronic kidney disease, stage 3 (moderate): Secondary | ICD-10-CM | POA: Diagnosis not present

## 2017-10-08 DIAGNOSIS — E7800 Pure hypercholesterolemia, unspecified: Secondary | ICD-10-CM | POA: Diagnosis not present

## 2017-10-08 DIAGNOSIS — E039 Hypothyroidism, unspecified: Secondary | ICD-10-CM | POA: Diagnosis not present

## 2017-10-08 DIAGNOSIS — Z6835 Body mass index (BMI) 35.0-35.9, adult: Secondary | ICD-10-CM | POA: Diagnosis not present

## 2017-10-08 DIAGNOSIS — R32 Unspecified urinary incontinence: Secondary | ICD-10-CM | POA: Diagnosis not present

## 2017-10-08 DIAGNOSIS — E559 Vitamin D deficiency, unspecified: Secondary | ICD-10-CM | POA: Diagnosis not present

## 2017-12-03 DIAGNOSIS — Z1231 Encounter for screening mammogram for malignant neoplasm of breast: Secondary | ICD-10-CM | POA: Diagnosis not present

## 2018-01-07 DIAGNOSIS — E7800 Pure hypercholesterolemia, unspecified: Secondary | ICD-10-CM | POA: Diagnosis not present

## 2018-01-07 DIAGNOSIS — Z1389 Encounter for screening for other disorder: Secondary | ICD-10-CM | POA: Diagnosis not present

## 2018-01-07 DIAGNOSIS — Z23 Encounter for immunization: Secondary | ICD-10-CM | POA: Diagnosis not present

## 2018-01-07 DIAGNOSIS — Z6836 Body mass index (BMI) 36.0-36.9, adult: Secondary | ICD-10-CM | POA: Diagnosis not present

## 2018-01-07 DIAGNOSIS — E559 Vitamin D deficiency, unspecified: Secondary | ICD-10-CM | POA: Diagnosis not present

## 2018-01-07 DIAGNOSIS — M1611 Unilateral primary osteoarthritis, right hip: Secondary | ICD-10-CM | POA: Diagnosis not present

## 2018-01-07 DIAGNOSIS — R32 Unspecified urinary incontinence: Secondary | ICD-10-CM | POA: Diagnosis not present

## 2018-01-07 DIAGNOSIS — I10 Essential (primary) hypertension: Secondary | ICD-10-CM | POA: Diagnosis not present

## 2018-01-07 DIAGNOSIS — N183 Chronic kidney disease, stage 3 (moderate): Secondary | ICD-10-CM | POA: Diagnosis not present

## 2018-01-07 DIAGNOSIS — E039 Hypothyroidism, unspecified: Secondary | ICD-10-CM | POA: Diagnosis not present

## 2018-01-07 DIAGNOSIS — M1712 Unilateral primary osteoarthritis, left knee: Secondary | ICD-10-CM | POA: Diagnosis not present

## 2018-02-05 ENCOUNTER — Encounter (INDEPENDENT_AMBULATORY_CARE_PROVIDER_SITE_OTHER): Payer: Medicare Other | Admitting: Ophthalmology

## 2018-02-05 DIAGNOSIS — I10 Essential (primary) hypertension: Secondary | ICD-10-CM

## 2018-02-05 DIAGNOSIS — H43813 Vitreous degeneration, bilateral: Secondary | ICD-10-CM

## 2018-02-05 DIAGNOSIS — H353132 Nonexudative age-related macular degeneration, bilateral, intermediate dry stage: Secondary | ICD-10-CM | POA: Diagnosis not present

## 2018-02-05 DIAGNOSIS — H35033 Hypertensive retinopathy, bilateral: Secondary | ICD-10-CM

## 2018-04-05 DIAGNOSIS — E7800 Pure hypercholesterolemia, unspecified: Secondary | ICD-10-CM | POA: Diagnosis not present

## 2018-04-05 DIAGNOSIS — E039 Hypothyroidism, unspecified: Secondary | ICD-10-CM | POA: Diagnosis not present

## 2018-04-05 DIAGNOSIS — Z23 Encounter for immunization: Secondary | ICD-10-CM | POA: Diagnosis not present

## 2018-04-05 DIAGNOSIS — R32 Unspecified urinary incontinence: Secondary | ICD-10-CM | POA: Diagnosis not present

## 2018-04-05 DIAGNOSIS — N183 Chronic kidney disease, stage 3 (moderate): Secondary | ICD-10-CM | POA: Diagnosis not present

## 2018-04-05 DIAGNOSIS — E559 Vitamin D deficiency, unspecified: Secondary | ICD-10-CM | POA: Diagnosis not present

## 2018-04-05 DIAGNOSIS — I10 Essential (primary) hypertension: Secondary | ICD-10-CM | POA: Diagnosis not present

## 2018-04-05 DIAGNOSIS — M1611 Unilateral primary osteoarthritis, right hip: Secondary | ICD-10-CM | POA: Diagnosis not present

## 2018-04-05 DIAGNOSIS — M1712 Unilateral primary osteoarthritis, left knee: Secondary | ICD-10-CM | POA: Diagnosis not present

## 2018-04-19 DIAGNOSIS — M1611 Unilateral primary osteoarthritis, right hip: Secondary | ICD-10-CM | POA: Diagnosis not present

## 2018-04-19 DIAGNOSIS — M545 Low back pain: Secondary | ICD-10-CM | POA: Diagnosis not present

## 2018-04-19 DIAGNOSIS — M1712 Unilateral primary osteoarthritis, left knee: Secondary | ICD-10-CM | POA: Diagnosis not present

## 2018-05-17 DIAGNOSIS — M1611 Unilateral primary osteoarthritis, right hip: Secondary | ICD-10-CM | POA: Diagnosis not present

## 2018-05-17 DIAGNOSIS — M1712 Unilateral primary osteoarthritis, left knee: Secondary | ICD-10-CM | POA: Diagnosis not present

## 2018-07-05 DIAGNOSIS — I10 Essential (primary) hypertension: Secondary | ICD-10-CM | POA: Diagnosis not present

## 2018-07-05 DIAGNOSIS — E7800 Pure hypercholesterolemia, unspecified: Secondary | ICD-10-CM | POA: Diagnosis not present

## 2018-07-05 DIAGNOSIS — M1712 Unilateral primary osteoarthritis, left knee: Secondary | ICD-10-CM | POA: Diagnosis not present

## 2018-07-05 DIAGNOSIS — E559 Vitamin D deficiency, unspecified: Secondary | ICD-10-CM | POA: Diagnosis not present

## 2018-07-05 DIAGNOSIS — N183 Chronic kidney disease, stage 3 (moderate): Secondary | ICD-10-CM | POA: Diagnosis not present

## 2018-07-05 DIAGNOSIS — E039 Hypothyroidism, unspecified: Secondary | ICD-10-CM | POA: Diagnosis not present

## 2018-07-05 DIAGNOSIS — M1611 Unilateral primary osteoarthritis, right hip: Secondary | ICD-10-CM | POA: Diagnosis not present

## 2018-07-05 DIAGNOSIS — R32 Unspecified urinary incontinence: Secondary | ICD-10-CM | POA: Diagnosis not present

## 2018-07-22 ENCOUNTER — Other Ambulatory Visit: Payer: Self-pay | Admitting: Physician Assistant

## 2018-07-22 DIAGNOSIS — M79644 Pain in right finger(s): Secondary | ICD-10-CM | POA: Diagnosis not present

## 2018-09-24 DIAGNOSIS — N183 Chronic kidney disease, stage 3 (moderate): Secondary | ICD-10-CM | POA: Diagnosis not present

## 2018-09-24 DIAGNOSIS — I10 Essential (primary) hypertension: Secondary | ICD-10-CM | POA: Diagnosis not present

## 2018-09-24 DIAGNOSIS — N8110 Cystocele, unspecified: Secondary | ICD-10-CM | POA: Diagnosis not present

## 2018-09-24 DIAGNOSIS — E559 Vitamin D deficiency, unspecified: Secondary | ICD-10-CM | POA: Diagnosis not present

## 2018-09-24 DIAGNOSIS — M1611 Unilateral primary osteoarthritis, right hip: Secondary | ICD-10-CM | POA: Diagnosis not present

## 2018-09-24 DIAGNOSIS — M1712 Unilateral primary osteoarthritis, left knee: Secondary | ICD-10-CM | POA: Diagnosis not present

## 2018-09-24 DIAGNOSIS — E7800 Pure hypercholesterolemia, unspecified: Secondary | ICD-10-CM | POA: Diagnosis not present

## 2018-09-24 DIAGNOSIS — R32 Unspecified urinary incontinence: Secondary | ICD-10-CM | POA: Diagnosis not present

## 2018-09-24 DIAGNOSIS — E039 Hypothyroidism, unspecified: Secondary | ICD-10-CM | POA: Diagnosis not present

## 2018-11-17 DIAGNOSIS — R32 Unspecified urinary incontinence: Secondary | ICD-10-CM | POA: Diagnosis not present

## 2018-11-17 DIAGNOSIS — Q525 Fusion of labia: Secondary | ICD-10-CM | POA: Diagnosis not present

## 2018-11-17 DIAGNOSIS — M549 Dorsalgia, unspecified: Secondary | ICD-10-CM | POA: Diagnosis not present

## 2018-11-17 DIAGNOSIS — N907 Vulvar cyst: Secondary | ICD-10-CM | POA: Diagnosis not present

## 2018-11-22 DIAGNOSIS — N907 Vulvar cyst: Secondary | ICD-10-CM | POA: Diagnosis not present

## 2018-11-22 DIAGNOSIS — D1801 Hemangioma of skin and subcutaneous tissue: Secondary | ICD-10-CM | POA: Diagnosis not present

## 2018-12-07 DIAGNOSIS — Z1231 Encounter for screening mammogram for malignant neoplasm of breast: Secondary | ICD-10-CM | POA: Diagnosis not present

## 2018-12-27 DIAGNOSIS — M1712 Unilateral primary osteoarthritis, left knee: Secondary | ICD-10-CM | POA: Diagnosis not present

## 2018-12-27 DIAGNOSIS — M1611 Unilateral primary osteoarthritis, right hip: Secondary | ICD-10-CM | POA: Diagnosis not present

## 2018-12-27 DIAGNOSIS — I10 Essential (primary) hypertension: Secondary | ICD-10-CM | POA: Diagnosis not present

## 2018-12-27 DIAGNOSIS — N183 Chronic kidney disease, stage 3 (moderate): Secondary | ICD-10-CM | POA: Diagnosis not present

## 2018-12-27 DIAGNOSIS — Z0000 Encounter for general adult medical examination without abnormal findings: Secondary | ICD-10-CM | POA: Diagnosis not present

## 2018-12-27 DIAGNOSIS — Z1389 Encounter for screening for other disorder: Secondary | ICD-10-CM | POA: Diagnosis not present

## 2018-12-27 DIAGNOSIS — E559 Vitamin D deficiency, unspecified: Secondary | ICD-10-CM | POA: Diagnosis not present

## 2018-12-27 DIAGNOSIS — R32 Unspecified urinary incontinence: Secondary | ICD-10-CM | POA: Diagnosis not present

## 2018-12-27 DIAGNOSIS — E7800 Pure hypercholesterolemia, unspecified: Secondary | ICD-10-CM | POA: Diagnosis not present

## 2018-12-27 DIAGNOSIS — E039 Hypothyroidism, unspecified: Secondary | ICD-10-CM | POA: Diagnosis not present

## 2019-02-11 ENCOUNTER — Other Ambulatory Visit: Payer: Self-pay

## 2019-02-11 ENCOUNTER — Encounter (INDEPENDENT_AMBULATORY_CARE_PROVIDER_SITE_OTHER): Payer: Medicare Other | Admitting: Ophthalmology

## 2019-02-11 DIAGNOSIS — H35033 Hypertensive retinopathy, bilateral: Secondary | ICD-10-CM

## 2019-02-11 DIAGNOSIS — H353132 Nonexudative age-related macular degeneration, bilateral, intermediate dry stage: Secondary | ICD-10-CM

## 2019-02-11 DIAGNOSIS — I10 Essential (primary) hypertension: Secondary | ICD-10-CM | POA: Diagnosis not present

## 2019-02-11 DIAGNOSIS — H43813 Vitreous degeneration, bilateral: Secondary | ICD-10-CM | POA: Diagnosis not present

## 2019-02-28 DIAGNOSIS — D2271 Melanocytic nevi of right lower limb, including hip: Secondary | ICD-10-CM | POA: Diagnosis not present

## 2019-02-28 DIAGNOSIS — D485 Neoplasm of uncertain behavior of skin: Secondary | ICD-10-CM | POA: Diagnosis not present

## 2019-02-28 DIAGNOSIS — D224 Melanocytic nevi of scalp and neck: Secondary | ICD-10-CM | POA: Diagnosis not present

## 2019-02-28 DIAGNOSIS — Z85828 Personal history of other malignant neoplasm of skin: Secondary | ICD-10-CM | POA: Diagnosis not present

## 2019-02-28 DIAGNOSIS — Z808 Family history of malignant neoplasm of other organs or systems: Secondary | ICD-10-CM | POA: Diagnosis not present

## 2019-02-28 DIAGNOSIS — L570 Actinic keratosis: Secondary | ICD-10-CM | POA: Diagnosis not present

## 2019-02-28 DIAGNOSIS — D2272 Melanocytic nevi of left lower limb, including hip: Secondary | ICD-10-CM | POA: Diagnosis not present

## 2019-02-28 DIAGNOSIS — L821 Other seborrheic keratosis: Secondary | ICD-10-CM | POA: Diagnosis not present

## 2019-04-21 DIAGNOSIS — N183 Chronic kidney disease, stage 3 unspecified: Secondary | ICD-10-CM | POA: Diagnosis not present

## 2019-04-21 DIAGNOSIS — L03115 Cellulitis of right lower limb: Secondary | ICD-10-CM | POA: Diagnosis not present

## 2019-04-21 DIAGNOSIS — E039 Hypothyroidism, unspecified: Secondary | ICD-10-CM | POA: Diagnosis not present

## 2019-04-21 DIAGNOSIS — I10 Essential (primary) hypertension: Secondary | ICD-10-CM | POA: Diagnosis not present

## 2019-04-21 DIAGNOSIS — L02611 Cutaneous abscess of right foot: Secondary | ICD-10-CM | POA: Diagnosis not present

## 2019-05-04 DIAGNOSIS — L570 Actinic keratosis: Secondary | ICD-10-CM | POA: Diagnosis not present

## 2019-05-04 DIAGNOSIS — Z23 Encounter for immunization: Secondary | ICD-10-CM | POA: Diagnosis not present

## 2019-05-04 DIAGNOSIS — D485 Neoplasm of uncertain behavior of skin: Secondary | ICD-10-CM | POA: Diagnosis not present

## 2019-05-04 DIAGNOSIS — R238 Other skin changes: Secondary | ICD-10-CM | POA: Diagnosis not present

## 2019-05-10 DIAGNOSIS — Z23 Encounter for immunization: Secondary | ICD-10-CM | POA: Diagnosis not present

## 2019-05-17 ENCOUNTER — Encounter (INDEPENDENT_AMBULATORY_CARE_PROVIDER_SITE_OTHER): Payer: Medicare Other | Admitting: Ophthalmology

## 2019-05-17 ENCOUNTER — Other Ambulatory Visit: Payer: Self-pay

## 2019-05-17 DIAGNOSIS — H353221 Exudative age-related macular degeneration, left eye, with active choroidal neovascularization: Secondary | ICD-10-CM | POA: Diagnosis not present

## 2019-05-17 DIAGNOSIS — H43813 Vitreous degeneration, bilateral: Secondary | ICD-10-CM | POA: Diagnosis not present

## 2019-05-17 DIAGNOSIS — H353112 Nonexudative age-related macular degeneration, right eye, intermediate dry stage: Secondary | ICD-10-CM

## 2019-05-17 DIAGNOSIS — I10 Essential (primary) hypertension: Secondary | ICD-10-CM

## 2019-05-17 DIAGNOSIS — H35033 Hypertensive retinopathy, bilateral: Secondary | ICD-10-CM | POA: Diagnosis not present

## 2019-06-14 ENCOUNTER — Encounter (INDEPENDENT_AMBULATORY_CARE_PROVIDER_SITE_OTHER): Payer: Medicare Other | Admitting: Ophthalmology

## 2019-06-14 DIAGNOSIS — H353112 Nonexudative age-related macular degeneration, right eye, intermediate dry stage: Secondary | ICD-10-CM

## 2019-06-14 DIAGNOSIS — H43813 Vitreous degeneration, bilateral: Secondary | ICD-10-CM

## 2019-06-14 DIAGNOSIS — H35033 Hypertensive retinopathy, bilateral: Secondary | ICD-10-CM | POA: Diagnosis not present

## 2019-06-14 DIAGNOSIS — H353221 Exudative age-related macular degeneration, left eye, with active choroidal neovascularization: Secondary | ICD-10-CM

## 2019-06-14 DIAGNOSIS — I10 Essential (primary) hypertension: Secondary | ICD-10-CM

## 2019-06-28 DIAGNOSIS — H3530 Unspecified macular degeneration: Secondary | ICD-10-CM | POA: Diagnosis not present

## 2019-06-28 DIAGNOSIS — I10 Essential (primary) hypertension: Secondary | ICD-10-CM | POA: Diagnosis not present

## 2019-06-28 DIAGNOSIS — E7800 Pure hypercholesterolemia, unspecified: Secondary | ICD-10-CM | POA: Diagnosis not present

## 2019-06-28 DIAGNOSIS — R32 Unspecified urinary incontinence: Secondary | ICD-10-CM | POA: Diagnosis not present

## 2019-06-28 DIAGNOSIS — N1832 Chronic kidney disease, stage 3b: Secondary | ICD-10-CM | POA: Diagnosis not present

## 2019-06-28 DIAGNOSIS — E559 Vitamin D deficiency, unspecified: Secondary | ICD-10-CM | POA: Diagnosis not present

## 2019-06-28 DIAGNOSIS — M1712 Unilateral primary osteoarthritis, left knee: Secondary | ICD-10-CM | POA: Diagnosis not present

## 2019-06-28 DIAGNOSIS — M1611 Unilateral primary osteoarthritis, right hip: Secondary | ICD-10-CM | POA: Diagnosis not present

## 2019-06-28 DIAGNOSIS — E039 Hypothyroidism, unspecified: Secondary | ICD-10-CM | POA: Diagnosis not present

## 2019-07-12 ENCOUNTER — Encounter (INDEPENDENT_AMBULATORY_CARE_PROVIDER_SITE_OTHER): Payer: Medicare Other | Admitting: Ophthalmology

## 2019-07-12 ENCOUNTER — Other Ambulatory Visit: Payer: Self-pay

## 2019-07-12 DIAGNOSIS — I10 Essential (primary) hypertension: Secondary | ICD-10-CM | POA: Diagnosis not present

## 2019-07-12 DIAGNOSIS — H353112 Nonexudative age-related macular degeneration, right eye, intermediate dry stage: Secondary | ICD-10-CM | POA: Diagnosis not present

## 2019-07-12 DIAGNOSIS — H353221 Exudative age-related macular degeneration, left eye, with active choroidal neovascularization: Secondary | ICD-10-CM | POA: Diagnosis not present

## 2019-07-12 DIAGNOSIS — H35033 Hypertensive retinopathy, bilateral: Secondary | ICD-10-CM

## 2019-07-12 DIAGNOSIS — H43813 Vitreous degeneration, bilateral: Secondary | ICD-10-CM

## 2019-08-09 ENCOUNTER — Encounter (INDEPENDENT_AMBULATORY_CARE_PROVIDER_SITE_OTHER): Payer: Medicare Other | Admitting: Ophthalmology

## 2019-08-09 ENCOUNTER — Other Ambulatory Visit: Payer: Self-pay

## 2019-08-09 DIAGNOSIS — H43813 Vitreous degeneration, bilateral: Secondary | ICD-10-CM

## 2019-08-09 DIAGNOSIS — I10 Essential (primary) hypertension: Secondary | ICD-10-CM

## 2019-08-09 DIAGNOSIS — H353221 Exudative age-related macular degeneration, left eye, with active choroidal neovascularization: Secondary | ICD-10-CM | POA: Diagnosis not present

## 2019-08-09 DIAGNOSIS — H353112 Nonexudative age-related macular degeneration, right eye, intermediate dry stage: Secondary | ICD-10-CM | POA: Diagnosis not present

## 2019-08-09 DIAGNOSIS — H35033 Hypertensive retinopathy, bilateral: Secondary | ICD-10-CM | POA: Diagnosis not present

## 2019-09-06 ENCOUNTER — Encounter (INDEPENDENT_AMBULATORY_CARE_PROVIDER_SITE_OTHER): Payer: Medicare Other | Admitting: Ophthalmology

## 2019-09-06 DIAGNOSIS — H35033 Hypertensive retinopathy, bilateral: Secondary | ICD-10-CM

## 2019-09-06 DIAGNOSIS — H43813 Vitreous degeneration, bilateral: Secondary | ICD-10-CM | POA: Diagnosis not present

## 2019-09-06 DIAGNOSIS — H353221 Exudative age-related macular degeneration, left eye, with active choroidal neovascularization: Secondary | ICD-10-CM | POA: Diagnosis not present

## 2019-09-06 DIAGNOSIS — I10 Essential (primary) hypertension: Secondary | ICD-10-CM

## 2019-09-06 DIAGNOSIS — H353112 Nonexudative age-related macular degeneration, right eye, intermediate dry stage: Secondary | ICD-10-CM

## 2019-09-30 DIAGNOSIS — N183 Chronic kidney disease, stage 3 unspecified: Secondary | ICD-10-CM | POA: Diagnosis not present

## 2019-09-30 DIAGNOSIS — E039 Hypothyroidism, unspecified: Secondary | ICD-10-CM | POA: Diagnosis not present

## 2019-09-30 DIAGNOSIS — E7800 Pure hypercholesterolemia, unspecified: Secondary | ICD-10-CM | POA: Diagnosis not present

## 2019-09-30 DIAGNOSIS — M1611 Unilateral primary osteoarthritis, right hip: Secondary | ICD-10-CM | POA: Diagnosis not present

## 2019-09-30 DIAGNOSIS — M1712 Unilateral primary osteoarthritis, left knee: Secondary | ICD-10-CM | POA: Diagnosis not present

## 2019-09-30 DIAGNOSIS — I10 Essential (primary) hypertension: Secondary | ICD-10-CM | POA: Diagnosis not present

## 2019-09-30 DIAGNOSIS — M19011 Primary osteoarthritis, right shoulder: Secondary | ICD-10-CM | POA: Diagnosis not present

## 2019-10-04 ENCOUNTER — Other Ambulatory Visit: Payer: Self-pay

## 2019-10-04 ENCOUNTER — Encounter (INDEPENDENT_AMBULATORY_CARE_PROVIDER_SITE_OTHER): Payer: Medicare Other | Admitting: Ophthalmology

## 2019-10-04 DIAGNOSIS — I10 Essential (primary) hypertension: Secondary | ICD-10-CM | POA: Diagnosis not present

## 2019-10-04 DIAGNOSIS — H35033 Hypertensive retinopathy, bilateral: Secondary | ICD-10-CM | POA: Diagnosis not present

## 2019-10-04 DIAGNOSIS — H353221 Exudative age-related macular degeneration, left eye, with active choroidal neovascularization: Secondary | ICD-10-CM | POA: Diagnosis not present

## 2019-10-04 DIAGNOSIS — H353112 Nonexudative age-related macular degeneration, right eye, intermediate dry stage: Secondary | ICD-10-CM

## 2019-10-04 DIAGNOSIS — H43813 Vitreous degeneration, bilateral: Secondary | ICD-10-CM | POA: Diagnosis not present

## 2019-10-25 DIAGNOSIS — L570 Actinic keratosis: Secondary | ICD-10-CM | POA: Diagnosis not present

## 2019-11-01 ENCOUNTER — Encounter (INDEPENDENT_AMBULATORY_CARE_PROVIDER_SITE_OTHER): Payer: Medicare Other | Admitting: Ophthalmology

## 2019-11-08 ENCOUNTER — Encounter (INDEPENDENT_AMBULATORY_CARE_PROVIDER_SITE_OTHER): Payer: Medicare Other | Admitting: Ophthalmology

## 2019-11-08 ENCOUNTER — Other Ambulatory Visit: Payer: Self-pay

## 2019-11-08 DIAGNOSIS — H35033 Hypertensive retinopathy, bilateral: Secondary | ICD-10-CM | POA: Diagnosis not present

## 2019-11-08 DIAGNOSIS — H353121 Nonexudative age-related macular degeneration, left eye, early dry stage: Secondary | ICD-10-CM | POA: Diagnosis not present

## 2019-11-08 DIAGNOSIS — H43813 Vitreous degeneration, bilateral: Secondary | ICD-10-CM | POA: Diagnosis not present

## 2019-11-08 DIAGNOSIS — H353112 Nonexudative age-related macular degeneration, right eye, intermediate dry stage: Secondary | ICD-10-CM

## 2019-11-08 DIAGNOSIS — I10 Essential (primary) hypertension: Secondary | ICD-10-CM | POA: Diagnosis not present

## 2019-11-25 DIAGNOSIS — M1611 Unilateral primary osteoarthritis, right hip: Secondary | ICD-10-CM | POA: Diagnosis not present

## 2019-11-25 DIAGNOSIS — M19011 Primary osteoarthritis, right shoulder: Secondary | ICD-10-CM | POA: Diagnosis not present

## 2019-11-25 DIAGNOSIS — E7800 Pure hypercholesterolemia, unspecified: Secondary | ICD-10-CM | POA: Diagnosis not present

## 2019-11-25 DIAGNOSIS — E039 Hypothyroidism, unspecified: Secondary | ICD-10-CM | POA: Diagnosis not present

## 2019-11-25 DIAGNOSIS — N183 Chronic kidney disease, stage 3 unspecified: Secondary | ICD-10-CM | POA: Diagnosis not present

## 2019-11-25 DIAGNOSIS — I10 Essential (primary) hypertension: Secondary | ICD-10-CM | POA: Diagnosis not present

## 2019-11-25 DIAGNOSIS — M1712 Unilateral primary osteoarthritis, left knee: Secondary | ICD-10-CM | POA: Diagnosis not present

## 2019-12-06 ENCOUNTER — Encounter (INDEPENDENT_AMBULATORY_CARE_PROVIDER_SITE_OTHER): Payer: Medicare Other | Admitting: Ophthalmology

## 2019-12-06 ENCOUNTER — Other Ambulatory Visit: Payer: Self-pay

## 2019-12-06 DIAGNOSIS — H353112 Nonexudative age-related macular degeneration, right eye, intermediate dry stage: Secondary | ICD-10-CM

## 2019-12-06 DIAGNOSIS — I10 Essential (primary) hypertension: Secondary | ICD-10-CM

## 2019-12-06 DIAGNOSIS — H35033 Hypertensive retinopathy, bilateral: Secondary | ICD-10-CM | POA: Diagnosis not present

## 2019-12-06 DIAGNOSIS — H353221 Exudative age-related macular degeneration, left eye, with active choroidal neovascularization: Secondary | ICD-10-CM | POA: Diagnosis not present

## 2019-12-06 DIAGNOSIS — H43813 Vitreous degeneration, bilateral: Secondary | ICD-10-CM

## 2019-12-13 DIAGNOSIS — Z1231 Encounter for screening mammogram for malignant neoplasm of breast: Secondary | ICD-10-CM | POA: Diagnosis not present

## 2019-12-16 DIAGNOSIS — M542 Cervicalgia: Secondary | ICD-10-CM | POA: Diagnosis not present

## 2019-12-16 DIAGNOSIS — M25511 Pain in right shoulder: Secondary | ICD-10-CM | POA: Diagnosis not present

## 2019-12-16 DIAGNOSIS — M1712 Unilateral primary osteoarthritis, left knee: Secondary | ICD-10-CM | POA: Diagnosis not present

## 2019-12-29 DIAGNOSIS — M1611 Unilateral primary osteoarthritis, right hip: Secondary | ICD-10-CM | POA: Diagnosis not present

## 2019-12-29 DIAGNOSIS — F439 Reaction to severe stress, unspecified: Secondary | ICD-10-CM | POA: Diagnosis not present

## 2019-12-29 DIAGNOSIS — E7800 Pure hypercholesterolemia, unspecified: Secondary | ICD-10-CM | POA: Diagnosis not present

## 2019-12-29 DIAGNOSIS — I10 Essential (primary) hypertension: Secondary | ICD-10-CM | POA: Diagnosis not present

## 2019-12-29 DIAGNOSIS — N1832 Chronic kidney disease, stage 3b: Secondary | ICD-10-CM | POA: Diagnosis not present

## 2019-12-29 DIAGNOSIS — E039 Hypothyroidism, unspecified: Secondary | ICD-10-CM | POA: Diagnosis not present

## 2019-12-29 DIAGNOSIS — Z1389 Encounter for screening for other disorder: Secondary | ICD-10-CM | POA: Diagnosis not present

## 2019-12-29 DIAGNOSIS — M1712 Unilateral primary osteoarthritis, left knee: Secondary | ICD-10-CM | POA: Diagnosis not present

## 2019-12-29 DIAGNOSIS — H3530 Unspecified macular degeneration: Secondary | ICD-10-CM | POA: Diagnosis not present

## 2019-12-29 DIAGNOSIS — E559 Vitamin D deficiency, unspecified: Secondary | ICD-10-CM | POA: Diagnosis not present

## 2019-12-29 DIAGNOSIS — Z0000 Encounter for general adult medical examination without abnormal findings: Secondary | ICD-10-CM | POA: Diagnosis not present

## 2019-12-29 DIAGNOSIS — R32 Unspecified urinary incontinence: Secondary | ICD-10-CM | POA: Diagnosis not present

## 2020-01-10 ENCOUNTER — Encounter (INDEPENDENT_AMBULATORY_CARE_PROVIDER_SITE_OTHER): Payer: Medicare Other | Admitting: Ophthalmology

## 2020-01-10 ENCOUNTER — Other Ambulatory Visit: Payer: Self-pay

## 2020-01-10 DIAGNOSIS — H353112 Nonexudative age-related macular degeneration, right eye, intermediate dry stage: Secondary | ICD-10-CM | POA: Diagnosis not present

## 2020-01-10 DIAGNOSIS — I10 Essential (primary) hypertension: Secondary | ICD-10-CM | POA: Diagnosis not present

## 2020-01-10 DIAGNOSIS — H353221 Exudative age-related macular degeneration, left eye, with active choroidal neovascularization: Secondary | ICD-10-CM | POA: Diagnosis not present

## 2020-01-10 DIAGNOSIS — H35033 Hypertensive retinopathy, bilateral: Secondary | ICD-10-CM

## 2020-01-10 DIAGNOSIS — H43813 Vitreous degeneration, bilateral: Secondary | ICD-10-CM

## 2020-02-09 DIAGNOSIS — M1611 Unilateral primary osteoarthritis, right hip: Secondary | ICD-10-CM | POA: Diagnosis not present

## 2020-02-09 DIAGNOSIS — E7800 Pure hypercholesterolemia, unspecified: Secondary | ICD-10-CM | POA: Diagnosis not present

## 2020-02-09 DIAGNOSIS — E039 Hypothyroidism, unspecified: Secondary | ICD-10-CM | POA: Diagnosis not present

## 2020-02-09 DIAGNOSIS — I10 Essential (primary) hypertension: Secondary | ICD-10-CM | POA: Diagnosis not present

## 2020-02-09 DIAGNOSIS — M19011 Primary osteoarthritis, right shoulder: Secondary | ICD-10-CM | POA: Diagnosis not present

## 2020-02-09 DIAGNOSIS — M1712 Unilateral primary osteoarthritis, left knee: Secondary | ICD-10-CM | POA: Diagnosis not present

## 2020-02-09 DIAGNOSIS — N183 Chronic kidney disease, stage 3 unspecified: Secondary | ICD-10-CM | POA: Diagnosis not present

## 2020-02-14 ENCOUNTER — Encounter (INDEPENDENT_AMBULATORY_CARE_PROVIDER_SITE_OTHER): Payer: Medicare Other | Admitting: Ophthalmology

## 2020-02-14 ENCOUNTER — Other Ambulatory Visit: Payer: Self-pay

## 2020-02-14 DIAGNOSIS — H353112 Nonexudative age-related macular degeneration, right eye, intermediate dry stage: Secondary | ICD-10-CM | POA: Diagnosis not present

## 2020-02-14 DIAGNOSIS — I10 Essential (primary) hypertension: Secondary | ICD-10-CM

## 2020-02-14 DIAGNOSIS — H43813 Vitreous degeneration, bilateral: Secondary | ICD-10-CM

## 2020-02-14 DIAGNOSIS — H35033 Hypertensive retinopathy, bilateral: Secondary | ICD-10-CM | POA: Diagnosis not present

## 2020-02-14 DIAGNOSIS — H353221 Exudative age-related macular degeneration, left eye, with active choroidal neovascularization: Secondary | ICD-10-CM

## 2020-03-08 DIAGNOSIS — L578 Other skin changes due to chronic exposure to nonionizing radiation: Secondary | ICD-10-CM | POA: Diagnosis not present

## 2020-03-08 DIAGNOSIS — L723 Sebaceous cyst: Secondary | ICD-10-CM | POA: Diagnosis not present

## 2020-03-08 DIAGNOSIS — L570 Actinic keratosis: Secondary | ICD-10-CM | POA: Diagnosis not present

## 2020-03-08 DIAGNOSIS — D225 Melanocytic nevi of trunk: Secondary | ICD-10-CM | POA: Diagnosis not present

## 2020-03-08 DIAGNOSIS — D2271 Melanocytic nevi of right lower limb, including hip: Secondary | ICD-10-CM | POA: Diagnosis not present

## 2020-03-08 DIAGNOSIS — L821 Other seborrheic keratosis: Secondary | ICD-10-CM | POA: Diagnosis not present

## 2020-03-08 DIAGNOSIS — D2272 Melanocytic nevi of left lower limb, including hip: Secondary | ICD-10-CM | POA: Diagnosis not present

## 2020-03-08 DIAGNOSIS — Z808 Family history of malignant neoplasm of other organs or systems: Secondary | ICD-10-CM | POA: Diagnosis not present

## 2020-03-08 DIAGNOSIS — D224 Melanocytic nevi of scalp and neck: Secondary | ICD-10-CM | POA: Diagnosis not present

## 2020-03-08 DIAGNOSIS — Z85828 Personal history of other malignant neoplasm of skin: Secondary | ICD-10-CM | POA: Diagnosis not present

## 2020-03-20 ENCOUNTER — Other Ambulatory Visit: Payer: Self-pay

## 2020-03-20 ENCOUNTER — Encounter (INDEPENDENT_AMBULATORY_CARE_PROVIDER_SITE_OTHER): Payer: Medicare Other | Admitting: Ophthalmology

## 2020-03-20 DIAGNOSIS — H43813 Vitreous degeneration, bilateral: Secondary | ICD-10-CM | POA: Diagnosis not present

## 2020-03-20 DIAGNOSIS — I10 Essential (primary) hypertension: Secondary | ICD-10-CM | POA: Diagnosis not present

## 2020-03-20 DIAGNOSIS — H353112 Nonexudative age-related macular degeneration, right eye, intermediate dry stage: Secondary | ICD-10-CM | POA: Diagnosis not present

## 2020-03-20 DIAGNOSIS — H35033 Hypertensive retinopathy, bilateral: Secondary | ICD-10-CM | POA: Diagnosis not present

## 2020-03-20 DIAGNOSIS — H353221 Exudative age-related macular degeneration, left eye, with active choroidal neovascularization: Secondary | ICD-10-CM

## 2020-03-30 DIAGNOSIS — Z1159 Encounter for screening for other viral diseases: Secondary | ICD-10-CM | POA: Diagnosis not present

## 2020-03-30 DIAGNOSIS — Z20828 Contact with and (suspected) exposure to other viral communicable diseases: Secondary | ICD-10-CM | POA: Diagnosis not present

## 2020-04-04 DIAGNOSIS — N183 Chronic kidney disease, stage 3 unspecified: Secondary | ICD-10-CM | POA: Diagnosis not present

## 2020-04-04 DIAGNOSIS — M19011 Primary osteoarthritis, right shoulder: Secondary | ICD-10-CM | POA: Diagnosis not present

## 2020-04-04 DIAGNOSIS — I10 Essential (primary) hypertension: Secondary | ICD-10-CM | POA: Diagnosis not present

## 2020-04-04 DIAGNOSIS — E7800 Pure hypercholesterolemia, unspecified: Secondary | ICD-10-CM | POA: Diagnosis not present

## 2020-04-04 DIAGNOSIS — E039 Hypothyroidism, unspecified: Secondary | ICD-10-CM | POA: Diagnosis not present

## 2020-04-04 DIAGNOSIS — M1712 Unilateral primary osteoarthritis, left knee: Secondary | ICD-10-CM | POA: Diagnosis not present

## 2020-04-04 DIAGNOSIS — M1611 Unilateral primary osteoarthritis, right hip: Secondary | ICD-10-CM | POA: Diagnosis not present

## 2020-04-24 ENCOUNTER — Other Ambulatory Visit: Payer: Self-pay

## 2020-04-24 ENCOUNTER — Encounter (INDEPENDENT_AMBULATORY_CARE_PROVIDER_SITE_OTHER): Payer: Medicare Other | Admitting: Ophthalmology

## 2020-04-24 DIAGNOSIS — H35033 Hypertensive retinopathy, bilateral: Secondary | ICD-10-CM

## 2020-04-24 DIAGNOSIS — H43813 Vitreous degeneration, bilateral: Secondary | ICD-10-CM

## 2020-04-24 DIAGNOSIS — H353112 Nonexudative age-related macular degeneration, right eye, intermediate dry stage: Secondary | ICD-10-CM

## 2020-04-24 DIAGNOSIS — I10 Essential (primary) hypertension: Secondary | ICD-10-CM | POA: Diagnosis not present

## 2020-04-24 DIAGNOSIS — H353221 Exudative age-related macular degeneration, left eye, with active choroidal neovascularization: Secondary | ICD-10-CM | POA: Diagnosis not present

## 2020-05-02 DIAGNOSIS — Z23 Encounter for immunization: Secondary | ICD-10-CM | POA: Diagnosis not present

## 2020-05-03 DIAGNOSIS — Z1159 Encounter for screening for other viral diseases: Secondary | ICD-10-CM | POA: Diagnosis not present

## 2020-05-03 DIAGNOSIS — Z20828 Contact with and (suspected) exposure to other viral communicable diseases: Secondary | ICD-10-CM | POA: Diagnosis not present

## 2020-05-24 DIAGNOSIS — Z1159 Encounter for screening for other viral diseases: Secondary | ICD-10-CM | POA: Diagnosis not present

## 2020-05-24 DIAGNOSIS — Z20828 Contact with and (suspected) exposure to other viral communicable diseases: Secondary | ICD-10-CM | POA: Diagnosis not present

## 2020-05-29 ENCOUNTER — Other Ambulatory Visit: Payer: Self-pay

## 2020-05-29 ENCOUNTER — Encounter (INDEPENDENT_AMBULATORY_CARE_PROVIDER_SITE_OTHER): Payer: Medicare Other | Admitting: Ophthalmology

## 2020-05-29 DIAGNOSIS — H353112 Nonexudative age-related macular degeneration, right eye, intermediate dry stage: Secondary | ICD-10-CM | POA: Diagnosis not present

## 2020-05-29 DIAGNOSIS — H353221 Exudative age-related macular degeneration, left eye, with active choroidal neovascularization: Secondary | ICD-10-CM | POA: Diagnosis not present

## 2020-05-29 DIAGNOSIS — H35033 Hypertensive retinopathy, bilateral: Secondary | ICD-10-CM

## 2020-05-29 DIAGNOSIS — I10 Essential (primary) hypertension: Secondary | ICD-10-CM

## 2020-05-29 DIAGNOSIS — H43813 Vitreous degeneration, bilateral: Secondary | ICD-10-CM | POA: Diagnosis not present

## 2020-06-22 DIAGNOSIS — Z23 Encounter for immunization: Secondary | ICD-10-CM | POA: Diagnosis not present

## 2020-07-02 ENCOUNTER — Encounter (INDEPENDENT_AMBULATORY_CARE_PROVIDER_SITE_OTHER): Payer: Medicare Other | Admitting: Ophthalmology

## 2020-07-02 ENCOUNTER — Other Ambulatory Visit: Payer: Self-pay

## 2020-07-02 DIAGNOSIS — H35033 Hypertensive retinopathy, bilateral: Secondary | ICD-10-CM

## 2020-07-02 DIAGNOSIS — H353221 Exudative age-related macular degeneration, left eye, with active choroidal neovascularization: Secondary | ICD-10-CM | POA: Diagnosis not present

## 2020-07-02 DIAGNOSIS — H353112 Nonexudative age-related macular degeneration, right eye, intermediate dry stage: Secondary | ICD-10-CM | POA: Diagnosis not present

## 2020-07-02 DIAGNOSIS — I10 Essential (primary) hypertension: Secondary | ICD-10-CM

## 2020-07-02 DIAGNOSIS — H43813 Vitreous degeneration, bilateral: Secondary | ICD-10-CM

## 2020-07-09 DIAGNOSIS — H3530 Unspecified macular degeneration: Secondary | ICD-10-CM | POA: Diagnosis not present

## 2020-07-09 DIAGNOSIS — I10 Essential (primary) hypertension: Secondary | ICD-10-CM | POA: Diagnosis not present

## 2020-07-09 DIAGNOSIS — R32 Unspecified urinary incontinence: Secondary | ICD-10-CM | POA: Diagnosis not present

## 2020-07-09 DIAGNOSIS — E039 Hypothyroidism, unspecified: Secondary | ICD-10-CM | POA: Diagnosis not present

## 2020-07-09 DIAGNOSIS — M1712 Unilateral primary osteoarthritis, left knee: Secondary | ICD-10-CM | POA: Diagnosis not present

## 2020-07-09 DIAGNOSIS — E7800 Pure hypercholesterolemia, unspecified: Secondary | ICD-10-CM | POA: Diagnosis not present

## 2020-07-09 DIAGNOSIS — N1832 Chronic kidney disease, stage 3b: Secondary | ICD-10-CM | POA: Diagnosis not present

## 2020-07-09 DIAGNOSIS — M1611 Unilateral primary osteoarthritis, right hip: Secondary | ICD-10-CM | POA: Diagnosis not present

## 2020-07-09 DIAGNOSIS — E559 Vitamin D deficiency, unspecified: Secondary | ICD-10-CM | POA: Diagnosis not present

## 2020-08-13 ENCOUNTER — Other Ambulatory Visit: Payer: Self-pay

## 2020-08-13 ENCOUNTER — Encounter (INDEPENDENT_AMBULATORY_CARE_PROVIDER_SITE_OTHER): Payer: Medicare Other | Admitting: Ophthalmology

## 2020-08-13 DIAGNOSIS — H43813 Vitreous degeneration, bilateral: Secondary | ICD-10-CM

## 2020-08-13 DIAGNOSIS — H353112 Nonexudative age-related macular degeneration, right eye, intermediate dry stage: Secondary | ICD-10-CM | POA: Diagnosis not present

## 2020-08-13 DIAGNOSIS — I10 Essential (primary) hypertension: Secondary | ICD-10-CM

## 2020-08-13 DIAGNOSIS — H353221 Exudative age-related macular degeneration, left eye, with active choroidal neovascularization: Secondary | ICD-10-CM | POA: Diagnosis not present

## 2020-08-13 DIAGNOSIS — H35033 Hypertensive retinopathy, bilateral: Secondary | ICD-10-CM | POA: Diagnosis not present

## 2020-08-14 DIAGNOSIS — I10 Essential (primary) hypertension: Secondary | ICD-10-CM | POA: Diagnosis not present

## 2020-08-14 DIAGNOSIS — N1832 Chronic kidney disease, stage 3b: Secondary | ICD-10-CM | POA: Diagnosis not present

## 2020-08-14 DIAGNOSIS — E7800 Pure hypercholesterolemia, unspecified: Secondary | ICD-10-CM | POA: Diagnosis not present

## 2020-08-14 DIAGNOSIS — E039 Hypothyroidism, unspecified: Secondary | ICD-10-CM | POA: Diagnosis not present

## 2020-08-14 DIAGNOSIS — M19011 Primary osteoarthritis, right shoulder: Secondary | ICD-10-CM | POA: Diagnosis not present

## 2020-08-14 DIAGNOSIS — N183 Chronic kidney disease, stage 3 unspecified: Secondary | ICD-10-CM | POA: Diagnosis not present

## 2020-08-14 DIAGNOSIS — M1712 Unilateral primary osteoarthritis, left knee: Secondary | ICD-10-CM | POA: Diagnosis not present

## 2020-08-14 DIAGNOSIS — M1611 Unilateral primary osteoarthritis, right hip: Secondary | ICD-10-CM | POA: Diagnosis not present

## 2020-09-24 DIAGNOSIS — B351 Tinea unguium: Secondary | ICD-10-CM | POA: Diagnosis not present

## 2020-09-24 DIAGNOSIS — R600 Localized edema: Secondary | ICD-10-CM | POA: Diagnosis not present

## 2020-09-24 DIAGNOSIS — I10 Essential (primary) hypertension: Secondary | ICD-10-CM | POA: Diagnosis not present

## 2020-09-24 DIAGNOSIS — N1832 Chronic kidney disease, stage 3b: Secondary | ICD-10-CM | POA: Diagnosis not present

## 2020-09-25 DIAGNOSIS — E039 Hypothyroidism, unspecified: Secondary | ICD-10-CM | POA: Diagnosis not present

## 2020-09-25 DIAGNOSIS — M1611 Unilateral primary osteoarthritis, right hip: Secondary | ICD-10-CM | POA: Diagnosis not present

## 2020-09-25 DIAGNOSIS — N1832 Chronic kidney disease, stage 3b: Secondary | ICD-10-CM | POA: Diagnosis not present

## 2020-09-25 DIAGNOSIS — I10 Essential (primary) hypertension: Secondary | ICD-10-CM | POA: Diagnosis not present

## 2020-09-25 DIAGNOSIS — E7800 Pure hypercholesterolemia, unspecified: Secondary | ICD-10-CM | POA: Diagnosis not present

## 2020-09-25 DIAGNOSIS — M19011 Primary osteoarthritis, right shoulder: Secondary | ICD-10-CM | POA: Diagnosis not present

## 2020-09-25 DIAGNOSIS — M1712 Unilateral primary osteoarthritis, left knee: Secondary | ICD-10-CM | POA: Diagnosis not present

## 2020-10-01 ENCOUNTER — Encounter (INDEPENDENT_AMBULATORY_CARE_PROVIDER_SITE_OTHER): Payer: Medicare Other | Admitting: Ophthalmology

## 2020-10-01 ENCOUNTER — Other Ambulatory Visit: Payer: Self-pay

## 2020-10-01 DIAGNOSIS — H35033 Hypertensive retinopathy, bilateral: Secondary | ICD-10-CM | POA: Diagnosis not present

## 2020-10-01 DIAGNOSIS — H353231 Exudative age-related macular degeneration, bilateral, with active choroidal neovascularization: Secondary | ICD-10-CM | POA: Diagnosis not present

## 2020-10-01 DIAGNOSIS — H43813 Vitreous degeneration, bilateral: Secondary | ICD-10-CM

## 2020-10-01 DIAGNOSIS — I10 Essential (primary) hypertension: Secondary | ICD-10-CM

## 2020-10-03 ENCOUNTER — Other Ambulatory Visit: Payer: Self-pay

## 2020-10-03 ENCOUNTER — Encounter: Payer: Self-pay | Admitting: Podiatry

## 2020-10-03 ENCOUNTER — Ambulatory Visit (INDEPENDENT_AMBULATORY_CARE_PROVIDER_SITE_OTHER): Payer: Medicare Other | Admitting: Podiatry

## 2020-10-03 VITALS — BP 124/48 | Temp 96.3°F

## 2020-10-03 DIAGNOSIS — M79675 Pain in left toe(s): Secondary | ICD-10-CM | POA: Diagnosis not present

## 2020-10-03 DIAGNOSIS — M79674 Pain in right toe(s): Secondary | ICD-10-CM

## 2020-10-03 DIAGNOSIS — B351 Tinea unguium: Secondary | ICD-10-CM

## 2020-10-03 DIAGNOSIS — L989 Disorder of the skin and subcutaneous tissue, unspecified: Secondary | ICD-10-CM

## 2020-10-03 NOTE — Progress Notes (Signed)
   SUBJECTIVE Patient presents to office today complaining of elongated, thickened nails that cause pain while ambulating in shoes.  She is unable to trim her own nails. Patient is here for further evaluation and treatment.  Past Medical History:  Diagnosis Date  . Arthritis   . Chronic shoulder pain   . Hyperlipidemia   . Hypertension   . Hypothyroid   . Macular degeneration   . Pancreatitis   . Primary localized osteoarthritis of right knee 02/05/2016  . 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 Shortness of breath dyspnea    on exertion  . Stress bladder incontinence, female     OBJECTIVE General Patient is awake, alert, and oriented x 3 and in no acute distress. Derm Skin is dry and supple bilateral. Negative open lesions or macerations. Remaining integument unremarkable. Nails are tender, long, thickened and dystrophic with subungual debris, consistent with onychomycosis, 1-5 bilateral. No signs of infection noted.  Hyperkeratotic preulcerative callus tissue was also noted to the bilateral feet Vasc  DP and PT pedal pulses palpable bilaterally. Temperature gradient within normal limits.  Neuro Epicritic and protective threshold sensation grossly intact bilaterally.  Musculoskeletal Exam No symptomatic pedal deformities noted bilateral. Muscular strength within normal limits.  ASSESSMENT 1. Onychodystrophic nails 1-5 bilateral with hyperkeratosis of nails.  2. Onychomycosis of nail due to dermatophyte bilateral 3. Pain in foot bilateral 4.  Hyperkeratotic preulcerative callus lesions bilateral feet  PLAN OF CARE 1. Patient evaluated today.  2. Instructed to maintain good pedal hygiene and foot care.  3. Mechanical debridement of nails 1-5 bilaterally performed using a nail nipper. Filed with dremel without incident.  4.  Excisional debridement of the hyperkeratotic callus lesions was performed using a tissue nipper without incident or bleeding  5.  Return to clinic  in 3 mos.    Edrick Kins, DPM [facility]  Dr. Edrick Kins, DPM    [address], [place] [address]                Office [phone]  Fax [phone]

## 2020-10-16 DIAGNOSIS — H40023 Open angle with borderline findings, high risk, bilateral: Secondary | ICD-10-CM | POA: Diagnosis not present

## 2020-10-16 DIAGNOSIS — H40052 Ocular hypertension, left eye: Secondary | ICD-10-CM | POA: Diagnosis not present

## 2020-10-26 DIAGNOSIS — N1832 Chronic kidney disease, stage 3b: Secondary | ICD-10-CM | POA: Diagnosis not present

## 2020-10-26 DIAGNOSIS — M19011 Primary osteoarthritis, right shoulder: Secondary | ICD-10-CM | POA: Diagnosis not present

## 2020-10-26 DIAGNOSIS — M1712 Unilateral primary osteoarthritis, left knee: Secondary | ICD-10-CM | POA: Diagnosis not present

## 2020-10-26 DIAGNOSIS — E7800 Pure hypercholesterolemia, unspecified: Secondary | ICD-10-CM | POA: Diagnosis not present

## 2020-10-26 DIAGNOSIS — I10 Essential (primary) hypertension: Secondary | ICD-10-CM | POA: Diagnosis not present

## 2020-10-26 DIAGNOSIS — M1611 Unilateral primary osteoarthritis, right hip: Secondary | ICD-10-CM | POA: Diagnosis not present

## 2020-10-26 DIAGNOSIS — E039 Hypothyroidism, unspecified: Secondary | ICD-10-CM | POA: Diagnosis not present

## 2020-10-26 DIAGNOSIS — N183 Chronic kidney disease, stage 3 unspecified: Secondary | ICD-10-CM | POA: Diagnosis not present

## 2020-11-05 DIAGNOSIS — H40023 Open angle with borderline findings, high risk, bilateral: Secondary | ICD-10-CM | POA: Diagnosis not present

## 2020-11-05 DIAGNOSIS — H40052 Ocular hypertension, left eye: Secondary | ICD-10-CM | POA: Diagnosis not present

## 2020-11-19 ENCOUNTER — Encounter (INDEPENDENT_AMBULATORY_CARE_PROVIDER_SITE_OTHER): Payer: Medicare Other | Admitting: Ophthalmology

## 2020-11-19 ENCOUNTER — Other Ambulatory Visit: Payer: Self-pay

## 2020-11-19 DIAGNOSIS — I10 Essential (primary) hypertension: Secondary | ICD-10-CM | POA: Diagnosis not present

## 2020-11-19 DIAGNOSIS — H43813 Vitreous degeneration, bilateral: Secondary | ICD-10-CM | POA: Diagnosis not present

## 2020-11-19 DIAGNOSIS — H353231 Exudative age-related macular degeneration, bilateral, with active choroidal neovascularization: Secondary | ICD-10-CM

## 2020-11-19 DIAGNOSIS — H35033 Hypertensive retinopathy, bilateral: Secondary | ICD-10-CM | POA: Diagnosis not present

## 2020-11-29 DIAGNOSIS — Q525 Fusion of labia: Secondary | ICD-10-CM | POA: Diagnosis not present

## 2020-11-29 DIAGNOSIS — Z01419 Encounter for gynecological examination (general) (routine) without abnormal findings: Secondary | ICD-10-CM | POA: Diagnosis not present

## 2020-11-29 DIAGNOSIS — R300 Dysuria: Secondary | ICD-10-CM | POA: Diagnosis not present

## 2020-12-18 DIAGNOSIS — Z1231 Encounter for screening mammogram for malignant neoplasm of breast: Secondary | ICD-10-CM | POA: Diagnosis not present

## 2021-01-04 ENCOUNTER — Other Ambulatory Visit: Payer: Self-pay

## 2021-01-04 ENCOUNTER — Encounter: Payer: Self-pay | Admitting: Podiatry

## 2021-01-04 ENCOUNTER — Ambulatory Visit (INDEPENDENT_AMBULATORY_CARE_PROVIDER_SITE_OTHER): Payer: Medicare Other | Admitting: Podiatry

## 2021-01-04 DIAGNOSIS — B351 Tinea unguium: Secondary | ICD-10-CM | POA: Diagnosis not present

## 2021-01-04 DIAGNOSIS — D689 Coagulation defect, unspecified: Secondary | ICD-10-CM

## 2021-01-04 DIAGNOSIS — M79674 Pain in right toe(s): Secondary | ICD-10-CM | POA: Diagnosis not present

## 2021-01-04 DIAGNOSIS — M79675 Pain in left toe(s): Secondary | ICD-10-CM

## 2021-01-04 NOTE — Progress Notes (Signed)
This patient returns to my office for at risk foot care.  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  This patient is unable to cut nails herself since the patient cannot reach her nails.These nails are painful walking and wearing shoes.  This patient presents for at risk foot care today.  General Appearance  Alert, conversant and in no acute stress.  Vascular  Dorsalis pedis and posterior tibial  pulses are  weakly palpable  bilaterally.  Capillary return is within normal limits  bilaterally. Temperature is within normal limits  bilaterally.  Neurologic  Senn-Weinstein monofilament wire test within normal limits  bilaterally. Muscle power within normal limits bilaterally.  Nails Thick disfigured discolored nails with subungual debris  from hallux to fifth toes bilaterally. No evidence of bacterial infection or drainage bilaterally.  Orthopedic  No limitations of motion  feet .  No crepitus or effusions noted.  No bony pathology or digital deformities noted.  Skin  normotropic skin with no porokeratosis noted bilaterally.  No signs of infections or ulcers noted.     Onychomycosis  Pain in right toes  Pain in left toes  Consent was obtained for treatment procedures.   Mechanical debridement of nails 1-5  bilaterally performed with a nail nipper.  Filed with dremel without incident.    Return office visit                     Told patient to return for periodic foot care and evaluation due to potential at risk complications.   Gardiner Barefoot DPM

## 2021-01-07 ENCOUNTER — Encounter (INDEPENDENT_AMBULATORY_CARE_PROVIDER_SITE_OTHER): Payer: Medicare Other | Admitting: Ophthalmology

## 2021-01-07 ENCOUNTER — Other Ambulatory Visit: Payer: Self-pay

## 2021-01-07 DIAGNOSIS — H35033 Hypertensive retinopathy, bilateral: Secondary | ICD-10-CM

## 2021-01-07 DIAGNOSIS — I10 Essential (primary) hypertension: Secondary | ICD-10-CM

## 2021-01-07 DIAGNOSIS — H353231 Exudative age-related macular degeneration, bilateral, with active choroidal neovascularization: Secondary | ICD-10-CM

## 2021-01-07 DIAGNOSIS — H43813 Vitreous degeneration, bilateral: Secondary | ICD-10-CM

## 2021-01-10 DIAGNOSIS — M1712 Unilateral primary osteoarthritis, left knee: Secondary | ICD-10-CM | POA: Diagnosis not present

## 2021-01-10 DIAGNOSIS — M1611 Unilateral primary osteoarthritis, right hip: Secondary | ICD-10-CM | POA: Diagnosis not present

## 2021-01-10 DIAGNOSIS — N1832 Chronic kidney disease, stage 3b: Secondary | ICD-10-CM | POA: Diagnosis not present

## 2021-01-10 DIAGNOSIS — M19011 Primary osteoarthritis, right shoulder: Secondary | ICD-10-CM | POA: Diagnosis not present

## 2021-01-10 DIAGNOSIS — N183 Chronic kidney disease, stage 3 unspecified: Secondary | ICD-10-CM | POA: Diagnosis not present

## 2021-01-10 DIAGNOSIS — E039 Hypothyroidism, unspecified: Secondary | ICD-10-CM | POA: Diagnosis not present

## 2021-01-10 DIAGNOSIS — E7800 Pure hypercholesterolemia, unspecified: Secondary | ICD-10-CM | POA: Diagnosis not present

## 2021-01-10 DIAGNOSIS — I10 Essential (primary) hypertension: Secondary | ICD-10-CM | POA: Diagnosis not present

## 2021-01-15 DIAGNOSIS — U071 COVID-19: Secondary | ICD-10-CM | POA: Diagnosis not present

## 2021-01-28 DIAGNOSIS — Z0000 Encounter for general adult medical examination without abnormal findings: Secondary | ICD-10-CM | POA: Diagnosis not present

## 2021-01-28 DIAGNOSIS — E039 Hypothyroidism, unspecified: Secondary | ICD-10-CM | POA: Diagnosis not present

## 2021-01-28 DIAGNOSIS — M1611 Unilateral primary osteoarthritis, right hip: Secondary | ICD-10-CM | POA: Diagnosis not present

## 2021-01-28 DIAGNOSIS — E7800 Pure hypercholesterolemia, unspecified: Secondary | ICD-10-CM | POA: Diagnosis not present

## 2021-01-28 DIAGNOSIS — Z1389 Encounter for screening for other disorder: Secondary | ICD-10-CM | POA: Diagnosis not present

## 2021-01-28 DIAGNOSIS — R32 Unspecified urinary incontinence: Secondary | ICD-10-CM | POA: Diagnosis not present

## 2021-01-28 DIAGNOSIS — M1712 Unilateral primary osteoarthritis, left knee: Secondary | ICD-10-CM | POA: Diagnosis not present

## 2021-01-28 DIAGNOSIS — N1832 Chronic kidney disease, stage 3b: Secondary | ICD-10-CM | POA: Diagnosis not present

## 2021-01-28 DIAGNOSIS — E559 Vitamin D deficiency, unspecified: Secondary | ICD-10-CM | POA: Diagnosis not present

## 2021-01-28 DIAGNOSIS — I10 Essential (primary) hypertension: Secondary | ICD-10-CM | POA: Diagnosis not present

## 2021-01-28 DIAGNOSIS — H3530 Unspecified macular degeneration: Secondary | ICD-10-CM | POA: Diagnosis not present

## 2021-02-01 DIAGNOSIS — Z23 Encounter for immunization: Secondary | ICD-10-CM | POA: Diagnosis not present

## 2021-02-18 DIAGNOSIS — U071 COVID-19: Secondary | ICD-10-CM | POA: Diagnosis not present

## 2021-02-18 DIAGNOSIS — I10 Essential (primary) hypertension: Secondary | ICD-10-CM | POA: Diagnosis not present

## 2021-02-25 DIAGNOSIS — H40023 Open angle with borderline findings, high risk, bilateral: Secondary | ICD-10-CM | POA: Diagnosis not present

## 2021-02-25 DIAGNOSIS — H40052 Ocular hypertension, left eye: Secondary | ICD-10-CM | POA: Diagnosis not present

## 2021-02-26 ENCOUNTER — Encounter (INDEPENDENT_AMBULATORY_CARE_PROVIDER_SITE_OTHER): Payer: Medicare Other | Admitting: Ophthalmology

## 2021-02-26 ENCOUNTER — Other Ambulatory Visit: Payer: Self-pay

## 2021-02-26 DIAGNOSIS — H43813 Vitreous degeneration, bilateral: Secondary | ICD-10-CM

## 2021-02-26 DIAGNOSIS — H35033 Hypertensive retinopathy, bilateral: Secondary | ICD-10-CM

## 2021-02-26 DIAGNOSIS — H353231 Exudative age-related macular degeneration, bilateral, with active choroidal neovascularization: Secondary | ICD-10-CM | POA: Diagnosis not present

## 2021-02-26 DIAGNOSIS — I10 Essential (primary) hypertension: Secondary | ICD-10-CM | POA: Diagnosis not present

## 2021-03-08 DIAGNOSIS — D485 Neoplasm of uncertain behavior of skin: Secondary | ICD-10-CM | POA: Diagnosis not present

## 2021-03-08 DIAGNOSIS — L570 Actinic keratosis: Secondary | ICD-10-CM | POA: Diagnosis not present

## 2021-03-08 DIAGNOSIS — L720 Epidermal cyst: Secondary | ICD-10-CM | POA: Diagnosis not present

## 2021-03-08 DIAGNOSIS — L304 Erythema intertrigo: Secondary | ICD-10-CM | POA: Diagnosis not present

## 2021-03-08 DIAGNOSIS — D224 Melanocytic nevi of scalp and neck: Secondary | ICD-10-CM | POA: Diagnosis not present

## 2021-03-08 DIAGNOSIS — L578 Other skin changes due to chronic exposure to nonionizing radiation: Secondary | ICD-10-CM | POA: Diagnosis not present

## 2021-03-08 DIAGNOSIS — L821 Other seborrheic keratosis: Secondary | ICD-10-CM | POA: Diagnosis not present

## 2021-03-08 DIAGNOSIS — D0462 Carcinoma in situ of skin of left upper limb, including shoulder: Secondary | ICD-10-CM | POA: Diagnosis not present

## 2021-03-08 DIAGNOSIS — Z85828 Personal history of other malignant neoplasm of skin: Secondary | ICD-10-CM | POA: Diagnosis not present

## 2021-03-08 DIAGNOSIS — Z808 Family history of malignant neoplasm of other organs or systems: Secondary | ICD-10-CM | POA: Diagnosis not present

## 2021-03-08 DIAGNOSIS — D2272 Melanocytic nevi of left lower limb, including hip: Secondary | ICD-10-CM | POA: Diagnosis not present

## 2021-03-08 DIAGNOSIS — D225 Melanocytic nevi of trunk: Secondary | ICD-10-CM | POA: Diagnosis not present

## 2021-03-08 DIAGNOSIS — D2271 Melanocytic nevi of right lower limb, including hip: Secondary | ICD-10-CM | POA: Diagnosis not present

## 2021-03-18 DIAGNOSIS — N3944 Nocturnal enuresis: Secondary | ICD-10-CM | POA: Diagnosis not present

## 2021-03-18 DIAGNOSIS — N3946 Mixed incontinence: Secondary | ICD-10-CM | POA: Diagnosis not present

## 2021-03-18 DIAGNOSIS — N952 Postmenopausal atrophic vaginitis: Secondary | ICD-10-CM | POA: Diagnosis not present

## 2021-03-18 DIAGNOSIS — R8271 Bacteriuria: Secondary | ICD-10-CM | POA: Diagnosis not present

## 2021-03-18 DIAGNOSIS — R351 Nocturia: Secondary | ICD-10-CM | POA: Diagnosis not present

## 2021-04-03 DIAGNOSIS — D0462 Carcinoma in situ of skin of left upper limb, including shoulder: Secondary | ICD-10-CM | POA: Diagnosis not present

## 2021-04-04 DIAGNOSIS — N183 Chronic kidney disease, stage 3 unspecified: Secondary | ICD-10-CM | POA: Diagnosis not present

## 2021-04-04 DIAGNOSIS — M1712 Unilateral primary osteoarthritis, left knee: Secondary | ICD-10-CM | POA: Diagnosis not present

## 2021-04-04 DIAGNOSIS — I10 Essential (primary) hypertension: Secondary | ICD-10-CM | POA: Diagnosis not present

## 2021-04-04 DIAGNOSIS — N1832 Chronic kidney disease, stage 3b: Secondary | ICD-10-CM | POA: Diagnosis not present

## 2021-04-04 DIAGNOSIS — E039 Hypothyroidism, unspecified: Secondary | ICD-10-CM | POA: Diagnosis not present

## 2021-04-04 DIAGNOSIS — M19011 Primary osteoarthritis, right shoulder: Secondary | ICD-10-CM | POA: Diagnosis not present

## 2021-04-04 DIAGNOSIS — E7800 Pure hypercholesterolemia, unspecified: Secondary | ICD-10-CM | POA: Diagnosis not present

## 2021-04-04 DIAGNOSIS — M1611 Unilateral primary osteoarthritis, right hip: Secondary | ICD-10-CM | POA: Diagnosis not present

## 2021-04-12 ENCOUNTER — Encounter: Payer: Self-pay | Admitting: Podiatry

## 2021-04-12 ENCOUNTER — Ambulatory Visit (INDEPENDENT_AMBULATORY_CARE_PROVIDER_SITE_OTHER): Payer: Medicare Other | Admitting: Podiatry

## 2021-04-12 ENCOUNTER — Other Ambulatory Visit: Payer: Self-pay

## 2021-04-12 DIAGNOSIS — M79674 Pain in right toe(s): Secondary | ICD-10-CM | POA: Diagnosis not present

## 2021-04-12 DIAGNOSIS — M79675 Pain in left toe(s): Secondary | ICD-10-CM | POA: Diagnosis not present

## 2021-04-12 DIAGNOSIS — D689 Coagulation defect, unspecified: Secondary | ICD-10-CM

## 2021-04-12 DIAGNOSIS — W450XXA Nail entering through skin, initial encounter: Secondary | ICD-10-CM | POA: Insufficient documentation

## 2021-04-12 DIAGNOSIS — B351 Tinea unguium: Secondary | ICD-10-CM

## 2021-04-12 MED ORDER — CEPHALEXIN 500 MG PO CAPS: 500.0000 mg | ORAL_CAPSULE | Freq: Two times a day (BID) | ORAL | 0 refills | Status: DC

## 2021-04-12 NOTE — Progress Notes (Signed)
This patient returns to my office for at risk foot care.  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  This patient is unable to cut nails herself since the patient cannot reach her nails.These nails are painful walking and wearing shoes.  She also has injured her big toenail left foot at home.  She says her toe has now become painful, red  through the top of her big toe.This patient presents for at risk foot care today.  General Appearance  Alert, conversant and in no acute stress.  Vascular  Dorsalis pedis and posterior tibial  pulses are  weakly palpable  bilaterally.  Capillary return is within normal limits  bilaterally. Temperature is within normal limits  bilaterally.  Neurologic  Senn-Weinstein monofilament wire test within normal limits  bilaterally. Muscle power within normal limits bilaterally.  Nails Thick disfigured discolored nails with subungual debris  from hallux to fifth toes bilaterally. No evidence of bacterial infection or drainage bilaterally.  Nail plate is unattached with redness noted hallux toe.  Pus is noted subungually.  Orthopedic  No limitations of motion  feet .  No crepitus or effusions noted.  No bony pathology or digital deformities noted.  Skin  normotropic skin with no porokeratosis noted bilaterally.  No signs of infections or ulcers noted.     Onychomycosis  Pain in right toes  Pain in left toes  Nail plate injury  Consent was obtained for treatment procedures.   Mechanical debridement of nails 1-5  bilaterally performed with a nail nipper.  Filed with dremel without incident.  Excision of nail plate.  Recommend peroxide washes at home.  Prescribe Cephalexin for 1 week. Call if toe infection worsens.    Return office visit   3 months.                  Told patient to return for periodic foot care and evaluation due to potential at risk complications.   Gardiner Barefoot  DPM

## 2021-04-16 ENCOUNTER — Other Ambulatory Visit: Payer: Self-pay

## 2021-04-16 ENCOUNTER — Encounter (INDEPENDENT_AMBULATORY_CARE_PROVIDER_SITE_OTHER): Payer: Medicare Other | Admitting: Ophthalmology

## 2021-04-16 DIAGNOSIS — H35033 Hypertensive retinopathy, bilateral: Secondary | ICD-10-CM | POA: Diagnosis not present

## 2021-04-16 DIAGNOSIS — H353231 Exudative age-related macular degeneration, bilateral, with active choroidal neovascularization: Secondary | ICD-10-CM | POA: Diagnosis not present

## 2021-04-16 DIAGNOSIS — I10 Essential (primary) hypertension: Secondary | ICD-10-CM

## 2021-04-16 DIAGNOSIS — H43813 Vitreous degeneration, bilateral: Secondary | ICD-10-CM

## 2021-04-26 DIAGNOSIS — R351 Nocturia: Secondary | ICD-10-CM | POA: Diagnosis not present

## 2021-04-26 DIAGNOSIS — N3946 Mixed incontinence: Secondary | ICD-10-CM | POA: Diagnosis not present

## 2021-04-26 DIAGNOSIS — N3944 Nocturnal enuresis: Secondary | ICD-10-CM | POA: Diagnosis not present

## 2021-04-26 DIAGNOSIS — N952 Postmenopausal atrophic vaginitis: Secondary | ICD-10-CM | POA: Diagnosis not present

## 2021-06-04 ENCOUNTER — Other Ambulatory Visit: Payer: Self-pay

## 2021-06-04 ENCOUNTER — Encounter (INDEPENDENT_AMBULATORY_CARE_PROVIDER_SITE_OTHER): Payer: Medicare Other | Admitting: Ophthalmology

## 2021-06-04 DIAGNOSIS — H43813 Vitreous degeneration, bilateral: Secondary | ICD-10-CM | POA: Diagnosis not present

## 2021-06-04 DIAGNOSIS — I10 Essential (primary) hypertension: Secondary | ICD-10-CM | POA: Diagnosis not present

## 2021-06-04 DIAGNOSIS — H353231 Exudative age-related macular degeneration, bilateral, with active choroidal neovascularization: Secondary | ICD-10-CM

## 2021-06-04 DIAGNOSIS — H35033 Hypertensive retinopathy, bilateral: Secondary | ICD-10-CM | POA: Diagnosis not present

## 2021-06-24 DIAGNOSIS — E039 Hypothyroidism, unspecified: Secondary | ICD-10-CM | POA: Diagnosis not present

## 2021-06-24 DIAGNOSIS — M19011 Primary osteoarthritis, right shoulder: Secondary | ICD-10-CM | POA: Diagnosis not present

## 2021-06-24 DIAGNOSIS — N1832 Chronic kidney disease, stage 3b: Secondary | ICD-10-CM | POA: Diagnosis not present

## 2021-06-24 DIAGNOSIS — M1611 Unilateral primary osteoarthritis, right hip: Secondary | ICD-10-CM | POA: Diagnosis not present

## 2021-06-24 DIAGNOSIS — I10 Essential (primary) hypertension: Secondary | ICD-10-CM | POA: Diagnosis not present

## 2021-06-24 DIAGNOSIS — M1712 Unilateral primary osteoarthritis, left knee: Secondary | ICD-10-CM | POA: Diagnosis not present

## 2021-06-24 DIAGNOSIS — E7800 Pure hypercholesterolemia, unspecified: Secondary | ICD-10-CM | POA: Diagnosis not present

## 2021-07-09 DIAGNOSIS — Z0283 Encounter for blood-alcohol and blood-drug test: Secondary | ICD-10-CM | POA: Diagnosis not present

## 2021-07-12 ENCOUNTER — Ambulatory Visit (INDEPENDENT_AMBULATORY_CARE_PROVIDER_SITE_OTHER): Payer: Medicare Other | Admitting: Podiatry

## 2021-07-12 DIAGNOSIS — Z23 Encounter for immunization: Secondary | ICD-10-CM | POA: Diagnosis not present

## 2021-07-12 DIAGNOSIS — M79675 Pain in left toe(s): Secondary | ICD-10-CM | POA: Diagnosis not present

## 2021-07-12 DIAGNOSIS — M79674 Pain in right toe(s): Secondary | ICD-10-CM

## 2021-07-12 DIAGNOSIS — B351 Tinea unguium: Secondary | ICD-10-CM

## 2021-07-13 ENCOUNTER — Encounter: Payer: Self-pay | Admitting: Podiatry

## 2021-07-13 NOTE — Progress Notes (Signed)
This patient returns to my office for at risk foot care.  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  This patient is unable to cut nails herself since the patient cannot reach her nails.This patient presents for at risk foot care today. Patient was treated during office blackout.  General Appearance  Alert, conversant and in no acute stress.  Vascular  Dorsalis pedis and posterior tibial  pulses are  weakly palpable  bilaterally.  Capillary return is within normal limits  bilaterally. Temperature is within normal limits  bilaterally.  Neurologic  Senn-Weinstein monofilament wire test within normal limits  bilaterally. Muscle power within normal limits bilaterally.  Nails Thick disfigured discolored nails with subungual debris  from hallux to fifth toes bilaterally. No evidence of bacterial infection or drainage bilaterally.    Orthopedic  No limitations of motion  feet .  No crepitus or effusions noted.  No bony pathology or digital deformities noted.  Skin  normotropic skin with no porokeratosis noted bilaterally.  No signs of infections or ulcers noted.     Onychomycosis  Pain in right toes  Pain in left toes    Consent was obtained for treatment procedures.   Mechanical debridement of nails 1-5  bilaterally performed with a nail nipper.  Filed with dremel without incident.    Return office visit   3 months.                  Told patient to return for periodic foot care and evaluation due to potential at risk complications.   Skylen Danielsen DPM   

## 2021-07-24 DIAGNOSIS — R351 Nocturia: Secondary | ICD-10-CM | POA: Diagnosis not present

## 2021-07-24 DIAGNOSIS — R8279 Other abnormal findings on microbiological examination of urine: Secondary | ICD-10-CM | POA: Diagnosis not present

## 2021-07-24 DIAGNOSIS — N952 Postmenopausal atrophic vaginitis: Secondary | ICD-10-CM | POA: Diagnosis not present

## 2021-07-24 DIAGNOSIS — N3946 Mixed incontinence: Secondary | ICD-10-CM | POA: Diagnosis not present

## 2021-07-26 DIAGNOSIS — H3530 Unspecified macular degeneration: Secondary | ICD-10-CM | POA: Diagnosis not present

## 2021-07-26 DIAGNOSIS — N1832 Chronic kidney disease, stage 3b: Secondary | ICD-10-CM | POA: Diagnosis not present

## 2021-07-26 DIAGNOSIS — M1712 Unilateral primary osteoarthritis, left knee: Secondary | ICD-10-CM | POA: Diagnosis not present

## 2021-07-26 DIAGNOSIS — Z8616 Personal history of COVID-19: Secondary | ICD-10-CM | POA: Diagnosis not present

## 2021-07-26 DIAGNOSIS — E559 Vitamin D deficiency, unspecified: Secondary | ICD-10-CM | POA: Diagnosis not present

## 2021-07-26 DIAGNOSIS — I10 Essential (primary) hypertension: Secondary | ICD-10-CM | POA: Diagnosis not present

## 2021-07-26 DIAGNOSIS — N390 Urinary tract infection, site not specified: Secondary | ICD-10-CM | POA: Diagnosis not present

## 2021-07-26 DIAGNOSIS — E7800 Pure hypercholesterolemia, unspecified: Secondary | ICD-10-CM | POA: Diagnosis not present

## 2021-07-26 DIAGNOSIS — M1611 Unilateral primary osteoarthritis, right hip: Secondary | ICD-10-CM | POA: Diagnosis not present

## 2021-07-26 DIAGNOSIS — E039 Hypothyroidism, unspecified: Secondary | ICD-10-CM | POA: Diagnosis not present

## 2021-07-26 DIAGNOSIS — Z23 Encounter for immunization: Secondary | ICD-10-CM | POA: Diagnosis not present

## 2021-07-26 DIAGNOSIS — R32 Unspecified urinary incontinence: Secondary | ICD-10-CM | POA: Diagnosis not present

## 2021-07-29 DIAGNOSIS — Z20822 Contact with and (suspected) exposure to covid-19: Secondary | ICD-10-CM | POA: Diagnosis not present

## 2021-07-30 ENCOUNTER — Other Ambulatory Visit: Payer: Self-pay

## 2021-07-30 ENCOUNTER — Encounter (INDEPENDENT_AMBULATORY_CARE_PROVIDER_SITE_OTHER): Payer: Medicare Other | Admitting: Ophthalmology

## 2021-07-30 DIAGNOSIS — H43813 Vitreous degeneration, bilateral: Secondary | ICD-10-CM | POA: Diagnosis not present

## 2021-07-30 DIAGNOSIS — I10 Essential (primary) hypertension: Secondary | ICD-10-CM

## 2021-07-30 DIAGNOSIS — H35033 Hypertensive retinopathy, bilateral: Secondary | ICD-10-CM | POA: Diagnosis not present

## 2021-07-30 DIAGNOSIS — H353231 Exudative age-related macular degeneration, bilateral, with active choroidal neovascularization: Secondary | ICD-10-CM

## 2021-08-27 DIAGNOSIS — H40052 Ocular hypertension, left eye: Secondary | ICD-10-CM | POA: Diagnosis not present

## 2021-08-27 DIAGNOSIS — H40023 Open angle with borderline findings, high risk, bilateral: Secondary | ICD-10-CM | POA: Diagnosis not present

## 2021-08-30 DIAGNOSIS — Z20822 Contact with and (suspected) exposure to covid-19: Secondary | ICD-10-CM | POA: Diagnosis not present

## 2021-09-07 DIAGNOSIS — Z20822 Contact with and (suspected) exposure to covid-19: Secondary | ICD-10-CM | POA: Diagnosis not present

## 2021-09-10 DIAGNOSIS — Z20822 Contact with and (suspected) exposure to covid-19: Secondary | ICD-10-CM | POA: Diagnosis not present

## 2021-09-17 DIAGNOSIS — Z20822 Contact with and (suspected) exposure to covid-19: Secondary | ICD-10-CM | POA: Diagnosis not present

## 2021-09-19 DIAGNOSIS — Z20822 Contact with and (suspected) exposure to covid-19: Secondary | ICD-10-CM | POA: Diagnosis not present

## 2021-09-24 ENCOUNTER — Other Ambulatory Visit: Payer: Self-pay

## 2021-09-24 ENCOUNTER — Encounter (INDEPENDENT_AMBULATORY_CARE_PROVIDER_SITE_OTHER): Payer: Medicare Other | Admitting: Ophthalmology

## 2021-09-24 DIAGNOSIS — H353231 Exudative age-related macular degeneration, bilateral, with active choroidal neovascularization: Secondary | ICD-10-CM | POA: Diagnosis not present

## 2021-09-24 DIAGNOSIS — H43813 Vitreous degeneration, bilateral: Secondary | ICD-10-CM | POA: Diagnosis not present

## 2021-09-24 DIAGNOSIS — H35033 Hypertensive retinopathy, bilateral: Secondary | ICD-10-CM | POA: Diagnosis not present

## 2021-09-24 DIAGNOSIS — I10 Essential (primary) hypertension: Secondary | ICD-10-CM | POA: Diagnosis not present

## 2021-10-02 DIAGNOSIS — Z20822 Contact with and (suspected) exposure to covid-19: Secondary | ICD-10-CM | POA: Diagnosis not present

## 2021-10-11 DIAGNOSIS — Z20822 Contact with and (suspected) exposure to covid-19: Secondary | ICD-10-CM | POA: Diagnosis not present

## 2021-10-16 DIAGNOSIS — Z20822 Contact with and (suspected) exposure to covid-19: Secondary | ICD-10-CM | POA: Diagnosis not present

## 2021-10-18 ENCOUNTER — Encounter: Payer: Self-pay | Admitting: Podiatry

## 2021-10-18 ENCOUNTER — Ambulatory Visit (INDEPENDENT_AMBULATORY_CARE_PROVIDER_SITE_OTHER): Payer: Medicare Other | Admitting: Podiatry

## 2021-10-18 DIAGNOSIS — D689 Coagulation defect, unspecified: Secondary | ICD-10-CM

## 2021-10-18 DIAGNOSIS — M79674 Pain in right toe(s): Secondary | ICD-10-CM

## 2021-10-18 DIAGNOSIS — M79675 Pain in left toe(s): Secondary | ICD-10-CM | POA: Diagnosis not present

## 2021-10-18 DIAGNOSIS — B351 Tinea unguium: Secondary | ICD-10-CM

## 2021-10-18 NOTE — Progress Notes (Signed)
This patient returns to my office for at risk foot care.  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  This patient is unable to cut nails herself since the patient cannot reach her nails.This patient presents for at risk foot care today. Patient was treated during office blackout.  General Appearance  Alert, conversant and in no acute stress.  Vascular  Dorsalis pedis and posterior tibial  pulses are  weakly palpable  bilaterally.  Capillary return is within normal limits  bilaterally. Temperature is within normal limits  bilaterally.  Neurologic  Senn-Weinstein monofilament wire test within normal limits  bilaterally. Muscle power within normal limits bilaterally.  Nails Thick disfigured discolored nails with subungual debris  from hallux to fifth toes bilaterally. No evidence of bacterial infection or drainage bilaterally.    Orthopedic  No limitations of motion  feet .  No crepitus or effusions noted.  No bony pathology or digital deformities noted.  Skin  normotropic skin with no porokeratosis noted bilaterally.  No signs of infections or ulcers noted.     Onychomycosis  Pain in right toes  Pain in left toes    Consent was obtained for treatment procedures.   Mechanical debridement of nails 1-5  bilaterally performed with a nail nipper.  Filed with dremel without incident.    Return office visit   3 months.                  Told patient to return for periodic foot care and evaluation due to potential at risk complications.   Javona Bergevin DPM   

## 2021-10-25 DIAGNOSIS — Z20822 Contact with and (suspected) exposure to covid-19: Secondary | ICD-10-CM | POA: Diagnosis not present

## 2021-10-31 DIAGNOSIS — Z20822 Contact with and (suspected) exposure to covid-19: Secondary | ICD-10-CM | POA: Diagnosis not present

## 2021-11-02 DIAGNOSIS — Z20822 Contact with and (suspected) exposure to covid-19: Secondary | ICD-10-CM | POA: Diagnosis not present

## 2021-11-04 DIAGNOSIS — R059 Cough, unspecified: Secondary | ICD-10-CM | POA: Diagnosis not present

## 2021-11-04 DIAGNOSIS — J189 Pneumonia, unspecified organism: Secondary | ICD-10-CM | POA: Diagnosis not present

## 2021-11-04 DIAGNOSIS — N1831 Chronic kidney disease, stage 3a: Secondary | ICD-10-CM | POA: Diagnosis not present

## 2021-11-04 DIAGNOSIS — R0981 Nasal congestion: Secondary | ICD-10-CM | POA: Diagnosis not present

## 2021-11-04 DIAGNOSIS — Z8616 Personal history of COVID-19: Secondary | ICD-10-CM | POA: Diagnosis not present

## 2021-11-04 DIAGNOSIS — Z03818 Encounter for observation for suspected exposure to other biological agents ruled out: Secondary | ICD-10-CM | POA: Diagnosis not present

## 2021-11-05 ENCOUNTER — Other Ambulatory Visit: Payer: Self-pay | Admitting: Family Medicine

## 2021-11-05 ENCOUNTER — Ambulatory Visit
Admission: RE | Admit: 2021-11-05 | Discharge: 2021-11-05 | Disposition: A | Discharge status: Home / Self Care | Payer: Medicare Other | Source: Ambulatory Visit | Attending: Family Medicine | Admitting: Family Medicine

## 2021-11-05 DIAGNOSIS — R059 Cough, unspecified: Secondary | ICD-10-CM | POA: Diagnosis not present

## 2021-11-05 DIAGNOSIS — Z20822 Contact with and (suspected) exposure to covid-19: Secondary | ICD-10-CM | POA: Diagnosis not present

## 2021-11-07 DIAGNOSIS — Z20822 Contact with and (suspected) exposure to covid-19: Secondary | ICD-10-CM | POA: Diagnosis not present

## 2021-11-08 ENCOUNTER — Emergency Department (HOSPITAL_BASED_OUTPATIENT_CLINIC_OR_DEPARTMENT_OTHER)
Admission: EM | Admit: 2021-11-08 | Discharge: 2021-11-08 | Disposition: A | Discharge status: Home / Self Care | Payer: Medicare Other | Attending: Emergency Medicine | Admitting: Emergency Medicine

## 2021-11-08 ENCOUNTER — Emergency Department (HOSPITAL_BASED_OUTPATIENT_CLINIC_OR_DEPARTMENT_OTHER): Payer: Medicare Other

## 2021-11-08 ENCOUNTER — Emergency Department (HOSPITAL_BASED_OUTPATIENT_CLINIC_OR_DEPARTMENT_OTHER): Payer: Medicare Other | Admitting: Radiology

## 2021-11-08 ENCOUNTER — Other Ambulatory Visit: Payer: Self-pay

## 2021-11-08 DIAGNOSIS — Z79899 Other long term (current) drug therapy: Secondary | ICD-10-CM | POA: Diagnosis not present

## 2021-11-08 DIAGNOSIS — K449 Diaphragmatic hernia without obstruction or gangrene: Secondary | ICD-10-CM | POA: Diagnosis not present

## 2021-11-08 DIAGNOSIS — J40 Bronchitis, not specified as acute or chronic: Secondary | ICD-10-CM | POA: Diagnosis not present

## 2021-11-08 DIAGNOSIS — J209 Acute bronchitis, unspecified: Secondary | ICD-10-CM | POA: Diagnosis not present

## 2021-11-08 DIAGNOSIS — R0600 Dyspnea, unspecified: Secondary | ICD-10-CM | POA: Diagnosis not present

## 2021-11-08 DIAGNOSIS — I10 Essential (primary) hypertension: Secondary | ICD-10-CM | POA: Diagnosis not present

## 2021-11-08 DIAGNOSIS — Z7982 Long term (current) use of aspirin: Secondary | ICD-10-CM | POA: Diagnosis not present

## 2021-11-08 DIAGNOSIS — J189 Pneumonia, unspecified organism: Secondary | ICD-10-CM | POA: Diagnosis not present

## 2021-11-08 DIAGNOSIS — E039 Hypothyroidism, unspecified: Secondary | ICD-10-CM | POA: Diagnosis not present

## 2021-11-08 DIAGNOSIS — J9811 Atelectasis: Secondary | ICD-10-CM | POA: Diagnosis not present

## 2021-11-08 DIAGNOSIS — R059 Cough, unspecified: Secondary | ICD-10-CM | POA: Diagnosis not present

## 2021-11-08 DIAGNOSIS — R051 Acute cough: Secondary | ICD-10-CM | POA: Diagnosis not present

## 2021-11-08 LAB — CBC WITH DIFFERENTIAL/PLATELET
Abs Immature Granulocytes: 0.09 10*3/uL — ABNORMAL HIGH (ref 0.00–0.07)
Basophils Absolute: 0.1 10*3/uL (ref 0.0–0.1)
Basophils Relative: 1 %
Eosinophils Absolute: 0.2 10*3/uL (ref 0.0–0.5)
Eosinophils Relative: 2 %
HCT: 36.1 % (ref 36.0–46.0)
Hemoglobin: 11.7 g/dL — ABNORMAL LOW (ref 12.0–15.0)
Immature Granulocytes: 1 %
Lymphocytes Relative: 23 %
Lymphs Abs: 2.1 10*3/uL (ref 0.7–4.0)
MCH: 29.9 pg (ref 26.0–34.0)
MCHC: 32.4 g/dL (ref 30.0–36.0)
MCV: 92.3 fL (ref 80.0–100.0)
Monocytes Absolute: 0.8 10*3/uL (ref 0.1–1.0)
Monocytes Relative: 9 %
Neutro Abs: 5.8 10*3/uL (ref 1.7–7.7)
Neutrophils Relative %: 64 %
Platelets: 203 10*3/uL (ref 150–400)
RBC: 3.91 MIL/uL (ref 3.87–5.11)
RDW: 12.6 % (ref 11.5–15.5)
WBC: 9 10*3/uL (ref 4.0–10.5)
nRBC: 0 % (ref 0.0–0.2)

## 2021-11-08 LAB — COMPREHENSIVE METABOLIC PANEL
ALT: 10 U/L (ref 0–44)
AST: 20 U/L (ref 15–41)
Albumin: 4.1 g/dL (ref 3.5–5.0)
Alkaline Phosphatase: 66 U/L (ref 38–126)
Anion gap: 11 (ref 5–15)
BUN: 32 mg/dL — ABNORMAL HIGH (ref 8–23)
CO2: 29 mmol/L (ref 22–32)
Calcium: 10.7 mg/dL — ABNORMAL HIGH (ref 8.9–10.3)
Chloride: 102 mmol/L (ref 98–111)
Creatinine, Ser: 1.37 mg/dL — ABNORMAL HIGH (ref 0.44–1.00)
GFR, Estimated: 36 mL/min — ABNORMAL LOW (ref >60)
Glucose, Bld: 106 mg/dL — ABNORMAL HIGH (ref 70–99)
Potassium: 3.5 mmol/L (ref 3.5–5.1)
Sodium: 142 mmol/L (ref 135–145)
Total Bilirubin: 0.4 mg/dL (ref 0.3–1.2)
Total Protein: 7.8 g/dL (ref 6.5–8.1)

## 2021-11-08 LAB — BRAIN NATRIURETIC PEPTIDE: B Natriuretic Peptide: 154.4 pg/mL — ABNORMAL HIGH (ref 0.0–100.0)

## 2021-11-08 LAB — LACTIC ACID, PLASMA: Lactic Acid, Venous: 1 mmol/L (ref 0.5–1.9)

## 2021-11-08 MED ORDER — SODIUM CHLORIDE 0.9 % IV SOLN
500.0000 mg | Freq: Once | INTRAVENOUS | Status: AC
  Administered 2021-11-08: 500 mg via INTRAVENOUS
  Filled 2021-11-08: qty 5

## 2021-11-08 MED ORDER — IOHEXOL 350 MG/ML SOLN
100.0000 mL | Freq: Once | INTRAVENOUS | Status: AC | PRN
Start: 2021-11-08 — End: 2021-11-08
  Administered 2021-11-08: 60 mL via INTRAVENOUS

## 2021-11-08 MED ORDER — CEFTRIAXONE SODIUM 1 G IJ SOLR
1.0000 g | Freq: Once | INTRAMUSCULAR | Status: AC
  Administered 2021-11-08: 1 g via INTRAVENOUS
  Filled 2021-11-08: qty 10

## 2021-11-08 MED ORDER — DEXAMETHASONE SODIUM PHOSPHATE 10 MG/ML IJ SOLN
10.0000 mg | Freq: Once | INTRAMUSCULAR | Status: AC
  Administered 2021-11-08: 10 mg via INTRAVENOUS
  Filled 2021-11-08: qty 1

## 2021-11-08 MED ORDER — BENZONATATE 100 MG PO CAPS: 100.0000 mg | ORAL_CAPSULE | Freq: Three times a day (TID) | ORAL | 0 refills | Status: DC | PRN

## 2021-11-08 NOTE — ED Provider Notes (Signed)
?[facility] ?Provider Note ? ? ?CSN: [id number] ?Arrival date & time: 11/08/21  1632 ? ?  ? ?History ? ?Chief Complaint  ?Patient presents with  ? Cough  ? ? ?Carla Taylor is a 88 y.o. female. ? ?HPI ? ?  ? ?88yo female with history of hypertension, hyperlipidemia, arthritis, recent diagnosis of pneumonia after 2 weeks of cough presents with concern for continued cough after 4 days of antibiotics.  ? ?Levofloxacin rx, took them Monday until today ?Not any better, not any worse, has not seen much change yet, son thinks not coughing as much at night ?Cough for 2 weeks, can't get it up, swallowing it ?No fever ?Generally weak ?Dyspnea, especially getting up to walk to bathroom ?No chest pain ?No leg pain, but has had swelling ?Runny nose chronically not changed  ?No hx of blood clots ? ? ?Past Medical History:  ?Diagnosis Date  ? Arthritis   ? Chronic shoulder pain   ? Hyperlipidemia   ? Hypertension   ? Hypothyroid   ? Macular degeneration   ? Pancreatitis   ? Primary localized osteoarthritis of right knee 02/05/2016  ? Primary osteoarthritis of right shoulder 09/19/2014  ? Shortness of breath dyspnea   ? on exertion  ? Stress bladder incontinence, female   ?  ? ?Home Medications ?Prior to Admission medications   ?Medication Sig Start Date End Date Taking? Authorizing Provider  ?benzonatate (TESSALON) 100 MG capsule Take 1 capsule (100 mg total) by mouth 3 (three) times daily as needed for cough. 11/08/21  Yes Gareth Morgan, MD  ?amLODipine (NORVASC) 5 MG tablet Take 5 mg by mouth daily.  12/27/15   [provider]  ?aspirin EC 81 MG tablet Take 81 mg by mouth daily.    [provider]  ?baclofen (LIORESAL) 10 MG tablet Take 1 tablet (10 mg total) by mouth 3 (three) times daily. As needed for muscle spasm 02/05/16   Marchia Bond, MD  ?calcium carbonate (OS-CAL - DOSED IN MG OF ELEMENTAL CALCIUM) 1250 MG tablet Take 1,250 mg by mouth 2 (two) times daily with a meal.      [provider]  ?cephALEXin (KEFLEX) 500 MG capsule Take 1 capsule (500 mg total) by mouth 2 (two) times daily. 04/12/21   Gardiner Barefoot, DPM  ?doxazosin (CARDURA) 2 MG tablet TAKE 1 TABLET BY MOUTH  DAILY  PLEASE OBTAIN FUTURE  REFILLS FROM PCP. 02/19/16   Lelon Perla, MD  ?hydrochlorothiazide (HYDRODIURIL) 25 MG tablet Take 25 mg by mouth daily.  04/29/14   [provider]  ?HYDROcodone-acetaminophen (NORCO) 10-325 MG tablet Take 1-2 tablets by mouth every 6 (six) hours as needed. 02/05/16   Marchia Bond, MD  ?levothyroxine (SYNTHROID, LEVOTHROID) 88 MCG tablet Take 88 mcg by mouth daily before breakfast.    [provider]  ?lisinopril (PRINIVIL,ZESTRIL) 40 MG tablet TAKE 1 TABLET BY MOUTH  DAILY 02/19/16   Lelon Perla, MD  ?metoprolol succinate (TOPROL-XL) 50 MG 24 hr tablet Take 1 and 1/2 tablets by mouth daily. <PLEASE MAKE APPOINTMENT FOR REFILLS> 04/02/17   Lelon Perla, MD  ?Multiple Vitamins-Minerals (ICAPS AREDS FORMULA PO) Take 1 tablet by mouth 2 (two) times daily.    [provider]  ?ondansetron (ZOFRAN) 4 MG tablet Take 1 tablet (4 mg total) by mouth every 8 (eight) hours as needed for nausea or vomiting. 07/20/16   Mackuen, Courteney Lyn, MD  ?rivaroxaban (XARELTO) 10 MG TABS tablet Take 1 tablet (10  mg total) by mouth daily. 02/05/16   Marchia Bond, MD  ?sennosides-docusate sodium (SENOKOT-S) 8.6-50 MG tablet Take 2 tablets by mouth daily. ?Patient taking differently: Take 2 tablets by mouth daily as needed for constipation. 09/19/14   Marchia Bond, MD  ?sennosides-docusate sodium (SENOKOT-S) 8.6-50 MG tablet Take 2 tablets by mouth daily. 02/05/16   Marchia Bond, MD  ?vitamin B-12 (CYANOCOBALAMIN) 500 MCG tablet Take 500 mcg by mouth daily.    [provider]  ?Vitamin D, Ergocalciferol, (DRISDOL) 50000 UNITS CAPS capsule Take 50,000 Units by mouth every Sunday.    [provider]  ?   ? ?Allergies    ?Patient has no active  allergies.   ? ?Review of Systems   ?Review of Systems ? ?Physical Exam ?Updated Vital Signs ?BP (!) 169/70   Pulse 63   Temp 97.8 ?F (36.6 ?C) (Oral)   Resp 17   Ht 5' (1.524 m)   Wt 83.9 kg   SpO2 91%   BMI 36.13 kg/m?  ?Physical Exam ? ?ED Results / Procedures / Treatments   ?Labs ?(all labs ordered are listed, but only abnormal results are displayed) ?Labs Reviewed  ?CBC WITH DIFFERENTIAL/PLATELET - Abnormal; Notable for the following components:  ?    Result Value  ? Hemoglobin 11.7 (*)   ? Abs Immature Granulocytes 0.09 (*)   ? All other components within normal limits  ?COMPREHENSIVE METABOLIC PANEL - Abnormal; Notable for the following components:  ? Glucose, Bld 106 (*)   ? BUN 32 (*)   ? Creatinine, Ser 1.37 (*)   ? Calcium 10.7 (*)   ? GFR, Estimated 36 (*)   ? All other components within normal limits  ?BRAIN NATRIURETIC PEPTIDE - Abnormal; Notable for the following components:  ? B Natriuretic Peptide 154.4 (*)   ? All other components within normal limits  ?CULTURE, BLOOD (ROUTINE X 2)  ?LACTIC ACID, PLASMA  ? ? ?EKG ?EKG Interpretation ? ?Date/Time:  Friday November 08 2021 22:01:27 EDT ?Ventricular Rate:  67 ?PR Interval:  192 ?QRS Duration: 90 ?QT Interval:  402 ?QTC Calculation: 425 ?R Axis:   49 ?Text Interpretation: Sinus or ectopic atrial rhythm similar to prior No STEMI Confirmed by Antony Blackbird [phone]) on 11/09/2021 2:00:58 PM ? ?Radiology ?DG Chest 2 View ? ?Result Date: 11/08/2021 ?CLINICAL DATA:  Cough. EXAM: CHEST - 2 VIEW COMPARISON:  November 05, 2021 FINDINGS: The heart size and mediastinal contours are within normal limits. There is marked severity calcification of the aortic arch. Very mild atelectasis and/or infiltrate is seen within the left lung base. There is no evidence of a pleural effusion or pneumothorax. An intact right shoulder replacement is noted. Multilevel degenerative changes seen throughout the thoracic spine. IMPRESSION: Very mild left basilar atelectasis and/or  infiltrate. Electronically Signed   By: Virgina Norfolk M.D.   On: 11/08/2021 20:59  ? ?CT Angio Chest PE W and/or Wo Contrast ? ?Result Date: 11/08/2021 ?CLINICAL DATA:  Reason pneumonia, patient not feeling better. EXAM: CT ANGIOGRAPHY CHEST WITH CONTRAST TECHNIQUE: Multidetector CT imaging of the chest was performed using the standard protocol during bolus administration of intravenous contrast. Multiplanar CT image reconstructions and MIPs were obtained to evaluate the vascular anatomy. RADIATION DOSE REDUCTION: This exam was performed according to the departmental dose-optimization program which includes automated exposure control, adjustment of the mA and/or kV according to patient size and/or use of iterative reconstruction technique. CONTRAST:  43m OMNIPAQUE IOHEXOL 350 MG/ML SOLN COMPARISON:  February 16, 2006  FINDINGS: Cardiovascular: There is marked severity calcification of the aortic arch and descending thoracic aorta, without evidence of aortic aneurysm satisfactory opacification of the pulmonary arteries to the segmental level. No evidence of pulmonary embolism. Normal heart size with marked severity coronary artery calcification. No pericardial effusion. Mediastinum/Nodes: Subcentimeter pretracheal and AP window lymph nodes are seen. The largest measures approximately 9 mm. A 1.9 cm right hilar lymph node is noted. Thyroid gland, trachea, and esophagus demonstrate no significant findings. Lungs/Pleura: Very mild atelectasis is noted within the posterior aspects of the bilateral lung bases and periphery of the bilateral lower lobes. There is no evidence of acute infiltrate, pleural effusion or pneumothorax. Upper Abdomen: There is a small hiatal hernia. Musculoskeletal: Multilevel degenerative changes seen throughout the thoracic spine. Review of the MIP images confirms the above findings. IMPRESSION: 1. No evidence of pulmonary embolism or other acute cardiopulmonary disease. 2. Marked severity  coronary artery calcification. 3. Small hiatal hernia. Aortic Atherosclerosis (ICD10-I70.0). Electronically Signed   By: Virgina Norfolk M.D.   On: 11/08/2021 22:43   ? ?Procedures ?Procedures  ? ? ?Medications Ordered in ED

## 2021-11-08 NOTE — ED Notes (Signed)
Patient transported to X-ray 

## 2021-11-08 NOTE — ED Triage Notes (Signed)
Reported was being treated for pneumonia since Tuesday and has not felt better yet.  ?

## 2021-11-11 DIAGNOSIS — Z20822 Contact with and (suspected) exposure to covid-19: Secondary | ICD-10-CM | POA: Diagnosis not present

## 2021-11-14 DIAGNOSIS — Z20822 Contact with and (suspected) exposure to covid-19: Secondary | ICD-10-CM | POA: Diagnosis not present

## 2021-11-14 LAB — CULTURE, BLOOD (ROUTINE X 2)
Culture: NO GROWTH
Special Requests: ADEQUATE

## 2021-11-18 DIAGNOSIS — Z20822 Contact with and (suspected) exposure to covid-19: Secondary | ICD-10-CM | POA: Diagnosis not present

## 2021-11-19 ENCOUNTER — Encounter (INDEPENDENT_AMBULATORY_CARE_PROVIDER_SITE_OTHER): Payer: Medicare Other | Admitting: Ophthalmology

## 2021-11-19 DIAGNOSIS — H35033 Hypertensive retinopathy, bilateral: Secondary | ICD-10-CM | POA: Diagnosis not present

## 2021-11-19 DIAGNOSIS — H43813 Vitreous degeneration, bilateral: Secondary | ICD-10-CM

## 2021-11-19 DIAGNOSIS — H353231 Exudative age-related macular degeneration, bilateral, with active choroidal neovascularization: Secondary | ICD-10-CM

## 2021-11-19 DIAGNOSIS — I10 Essential (primary) hypertension: Secondary | ICD-10-CM | POA: Diagnosis not present

## 2021-11-20 DIAGNOSIS — Z20822 Contact with and (suspected) exposure to covid-19: Secondary | ICD-10-CM | POA: Diagnosis not present

## 2021-11-21 DIAGNOSIS — N1832 Chronic kidney disease, stage 3b: Secondary | ICD-10-CM | POA: Diagnosis not present

## 2021-11-21 DIAGNOSIS — Z8701 Personal history of pneumonia (recurrent): Secondary | ICD-10-CM | POA: Diagnosis not present

## 2021-11-21 DIAGNOSIS — Z20822 Contact with and (suspected) exposure to covid-19: Secondary | ICD-10-CM | POA: Diagnosis not present

## 2021-11-21 DIAGNOSIS — I700 Atherosclerosis of aorta: Secondary | ICD-10-CM | POA: Diagnosis not present

## 2021-11-21 DIAGNOSIS — I10 Essential (primary) hypertension: Secondary | ICD-10-CM | POA: Diagnosis not present

## 2021-11-21 DIAGNOSIS — Z6836 Body mass index (BMI) 36.0-36.9, adult: Secondary | ICD-10-CM | POA: Diagnosis not present

## 2021-11-21 DIAGNOSIS — E039 Hypothyroidism, unspecified: Secondary | ICD-10-CM | POA: Diagnosis not present

## 2021-11-25 DIAGNOSIS — Z20822 Contact with and (suspected) exposure to covid-19: Secondary | ICD-10-CM | POA: Diagnosis not present

## 2021-11-29 DIAGNOSIS — N952 Postmenopausal atrophic vaginitis: Secondary | ICD-10-CM | POA: Diagnosis not present

## 2021-11-29 DIAGNOSIS — R351 Nocturia: Secondary | ICD-10-CM | POA: Diagnosis not present

## 2021-11-29 DIAGNOSIS — N3946 Mixed incontinence: Secondary | ICD-10-CM | POA: Diagnosis not present

## 2022-01-01 DIAGNOSIS — N3946 Mixed incontinence: Secondary | ICD-10-CM | POA: Diagnosis not present

## 2022-01-09 DIAGNOSIS — J069 Acute upper respiratory infection, unspecified: Secondary | ICD-10-CM | POA: Diagnosis not present

## 2022-01-09 DIAGNOSIS — R053 Chronic cough: Secondary | ICD-10-CM | POA: Diagnosis not present

## 2022-01-09 DIAGNOSIS — Z1231 Encounter for screening mammogram for malignant neoplasm of breast: Secondary | ICD-10-CM | POA: Diagnosis not present

## 2022-01-10 DIAGNOSIS — Z9181 History of falling: Secondary | ICD-10-CM | POA: Diagnosis not present

## 2022-01-10 DIAGNOSIS — Z466 Encounter for fitting and adjustment of urinary device: Secondary | ICD-10-CM | POA: Diagnosis not present

## 2022-01-10 DIAGNOSIS — K5900 Constipation, unspecified: Secondary | ICD-10-CM | POA: Diagnosis not present

## 2022-01-10 DIAGNOSIS — R296 Repeated falls: Secondary | ICD-10-CM | POA: Diagnosis not present

## 2022-01-10 DIAGNOSIS — Z87440 Personal history of urinary (tract) infections: Secondary | ICD-10-CM | POA: Diagnosis not present

## 2022-01-10 DIAGNOSIS — N3946 Mixed incontinence: Secondary | ICD-10-CM | POA: Diagnosis not present

## 2022-01-10 DIAGNOSIS — M549 Dorsalgia, unspecified: Secondary | ICD-10-CM | POA: Diagnosis not present

## 2022-01-10 DIAGNOSIS — H409 Unspecified glaucoma: Secondary | ICD-10-CM | POA: Diagnosis not present

## 2022-01-10 DIAGNOSIS — Z556 Problems related to health literacy: Secondary | ICD-10-CM | POA: Diagnosis not present

## 2022-01-13 ENCOUNTER — Encounter (INDEPENDENT_AMBULATORY_CARE_PROVIDER_SITE_OTHER): Payer: Medicare Other | Admitting: Ophthalmology

## 2022-01-13 DIAGNOSIS — H35033 Hypertensive retinopathy, bilateral: Secondary | ICD-10-CM

## 2022-01-13 DIAGNOSIS — H43813 Vitreous degeneration, bilateral: Secondary | ICD-10-CM | POA: Diagnosis not present

## 2022-01-13 DIAGNOSIS — I10 Essential (primary) hypertension: Secondary | ICD-10-CM | POA: Diagnosis not present

## 2022-01-13 DIAGNOSIS — H353231 Exudative age-related macular degeneration, bilateral, with active choroidal neovascularization: Secondary | ICD-10-CM

## 2022-01-14 ENCOUNTER — Ambulatory Visit (INDEPENDENT_AMBULATORY_CARE_PROVIDER_SITE_OTHER): Payer: Medicare Other | Admitting: Podiatry

## 2022-01-14 ENCOUNTER — Encounter: Payer: Self-pay | Admitting: Podiatry

## 2022-01-14 DIAGNOSIS — B351 Tinea unguium: Secondary | ICD-10-CM | POA: Diagnosis not present

## 2022-01-14 DIAGNOSIS — D689 Coagulation defect, unspecified: Secondary | ICD-10-CM

## 2022-01-14 DIAGNOSIS — M79674 Pain in right toe(s): Secondary | ICD-10-CM

## 2022-01-14 DIAGNOSIS — M79675 Pain in left toe(s): Secondary | ICD-10-CM

## 2022-01-17 ENCOUNTER — Ambulatory Visit: Payer: Medicare Other | Admitting: Podiatry

## 2022-01-20 ENCOUNTER — Encounter (HOSPITAL_COMMUNITY): Payer: Self-pay

## 2022-01-20 ENCOUNTER — Inpatient Hospital Stay (HOSPITAL_COMMUNITY)
Admission: EM | Admit: 2022-01-20 | Discharge: 2022-01-23 | DRG: 291 | Disposition: A | Discharge status: Home Health | Payer: Medicare Other | Attending: Internal Medicine | Admitting: Internal Medicine

## 2022-01-20 ENCOUNTER — Other Ambulatory Visit: Payer: Self-pay

## 2022-01-20 ENCOUNTER — Emergency Department (HOSPITAL_COMMUNITY): Payer: Medicare Other

## 2022-01-20 DIAGNOSIS — E039 Hypothyroidism, unspecified: Secondary | ICD-10-CM | POA: Diagnosis not present

## 2022-01-20 DIAGNOSIS — I5031 Acute diastolic (congestive) heart failure: Secondary | ICD-10-CM | POA: Diagnosis not present

## 2022-01-20 DIAGNOSIS — E559 Vitamin D deficiency, unspecified: Secondary | ICD-10-CM | POA: Diagnosis not present

## 2022-01-20 DIAGNOSIS — R918 Other nonspecific abnormal finding of lung field: Secondary | ICD-10-CM | POA: Diagnosis not present

## 2022-01-20 DIAGNOSIS — M1990 Unspecified osteoarthritis, unspecified site: Secondary | ICD-10-CM | POA: Diagnosis present

## 2022-01-20 DIAGNOSIS — J690 Pneumonitis due to inhalation of food and vomit: Secondary | ICD-10-CM | POA: Diagnosis not present

## 2022-01-20 DIAGNOSIS — J9601 Acute respiratory failure with hypoxia: Secondary | ICD-10-CM | POA: Diagnosis present

## 2022-01-20 DIAGNOSIS — Z6834 Body mass index (BMI) 34.0-34.9, adult: Secondary | ICD-10-CM

## 2022-01-20 DIAGNOSIS — D649 Anemia, unspecified: Secondary | ICD-10-CM | POA: Diagnosis not present

## 2022-01-20 DIAGNOSIS — E7800 Pure hypercholesterolemia, unspecified: Secondary | ICD-10-CM | POA: Diagnosis not present

## 2022-01-20 DIAGNOSIS — Z79890 Hormone replacement therapy (postmenopausal): Secondary | ICD-10-CM

## 2022-01-20 DIAGNOSIS — Z96611 Presence of right artificial shoulder joint: Secondary | ICD-10-CM | POA: Diagnosis present

## 2022-01-20 DIAGNOSIS — E782 Mixed hyperlipidemia: Secondary | ICD-10-CM | POA: Diagnosis present

## 2022-01-20 DIAGNOSIS — Z7982 Long term (current) use of aspirin: Secondary | ICD-10-CM

## 2022-01-20 DIAGNOSIS — J189 Pneumonia, unspecified organism: Secondary | ICD-10-CM | POA: Diagnosis present

## 2022-01-20 DIAGNOSIS — Z66 Do not resuscitate: Secondary | ICD-10-CM | POA: Diagnosis not present

## 2022-01-20 DIAGNOSIS — E669 Obesity, unspecified: Secondary | ICD-10-CM | POA: Diagnosis present

## 2022-01-20 DIAGNOSIS — Z556 Problems related to health literacy: Secondary | ICD-10-CM | POA: Diagnosis not present

## 2022-01-20 DIAGNOSIS — D631 Anemia in chronic kidney disease: Secondary | ICD-10-CM | POA: Diagnosis present

## 2022-01-20 DIAGNOSIS — N1832 Chronic kidney disease, stage 3b: Secondary | ICD-10-CM | POA: Diagnosis not present

## 2022-01-20 DIAGNOSIS — Z1331 Encounter for screening for depression: Secondary | ICD-10-CM | POA: Diagnosis not present

## 2022-01-20 DIAGNOSIS — Z0000 Encounter for general adult medical examination without abnormal findings: Secondary | ICD-10-CM | POA: Diagnosis not present

## 2022-01-20 DIAGNOSIS — I10 Essential (primary) hypertension: Secondary | ICD-10-CM | POA: Diagnosis not present

## 2022-01-20 DIAGNOSIS — D62 Acute posthemorrhagic anemia: Secondary | ICD-10-CM | POA: Diagnosis present

## 2022-01-20 DIAGNOSIS — E538 Deficiency of other specified B group vitamins: Secondary | ICD-10-CM | POA: Diagnosis present

## 2022-01-20 DIAGNOSIS — Z9189 Other specified personal risk factors, not elsewhere classified: Secondary | ICD-10-CM | POA: Diagnosis not present

## 2022-01-20 DIAGNOSIS — R0609 Other forms of dyspnea: Secondary | ICD-10-CM | POA: Diagnosis not present

## 2022-01-20 DIAGNOSIS — Z79899 Other long term (current) drug therapy: Secondary | ICD-10-CM | POA: Diagnosis not present

## 2022-01-20 DIAGNOSIS — R609 Edema, unspecified: Secondary | ICD-10-CM | POA: Diagnosis not present

## 2022-01-20 DIAGNOSIS — M549 Dorsalgia, unspecified: Secondary | ICD-10-CM | POA: Diagnosis not present

## 2022-01-20 DIAGNOSIS — E876 Hypokalemia: Secondary | ICD-10-CM | POA: Diagnosis not present

## 2022-01-20 DIAGNOSIS — H9190 Unspecified hearing loss, unspecified ear: Secondary | ICD-10-CM | POA: Diagnosis present

## 2022-01-20 DIAGNOSIS — R339 Retention of urine, unspecified: Secondary | ICD-10-CM | POA: Diagnosis present

## 2022-01-20 DIAGNOSIS — E785 Hyperlipidemia, unspecified: Secondary | ICD-10-CM | POA: Diagnosis present

## 2022-01-20 DIAGNOSIS — I5033 Acute on chronic diastolic (congestive) heart failure: Secondary | ICD-10-CM | POA: Diagnosis present

## 2022-01-20 DIAGNOSIS — Z7901 Long term (current) use of anticoagulants: Secondary | ICD-10-CM | POA: Diagnosis not present

## 2022-01-20 DIAGNOSIS — K5900 Constipation, unspecified: Secondary | ICD-10-CM | POA: Diagnosis not present

## 2022-01-20 DIAGNOSIS — H3530 Unspecified macular degeneration: Secondary | ICD-10-CM | POA: Diagnosis present

## 2022-01-20 DIAGNOSIS — J9611 Chronic respiratory failure with hypoxia: Secondary | ICD-10-CM | POA: Diagnosis not present

## 2022-01-20 DIAGNOSIS — I130 Hypertensive heart and chronic kidney disease with heart failure and stage 1 through stage 4 chronic kidney disease, or unspecified chronic kidney disease: Principal | ICD-10-CM | POA: Diagnosis present

## 2022-01-20 DIAGNOSIS — R296 Repeated falls: Secondary | ICD-10-CM | POA: Diagnosis not present

## 2022-01-20 DIAGNOSIS — H409 Unspecified glaucoma: Secondary | ICD-10-CM | POA: Diagnosis not present

## 2022-01-20 DIAGNOSIS — Z96651 Presence of right artificial knee joint: Secondary | ICD-10-CM | POA: Diagnosis present

## 2022-01-20 DIAGNOSIS — N3946 Mixed incontinence: Secondary | ICD-10-CM | POA: Diagnosis not present

## 2022-01-20 LAB — CBC
HCT: 29.5 % — ABNORMAL LOW (ref 36.0–46.0)
Hemoglobin: 9.3 g/dL — ABNORMAL LOW (ref 12.0–15.0)
MCH: 30.6 pg (ref 26.0–34.0)
MCHC: 31.5 g/dL (ref 30.0–36.0)
MCV: 97 fL (ref 80.0–100.0)
Platelets: 201 10*3/uL (ref 150–400)
RBC: 3.04 MIL/uL — ABNORMAL LOW (ref 3.87–5.11)
RDW: 13 % (ref 11.5–15.5)
WBC: 9.1 10*3/uL (ref 4.0–10.5)
nRBC: 0 % (ref 0.0–0.2)

## 2022-01-20 LAB — BRAIN NATRIURETIC PEPTIDE: B Natriuretic Peptide: 247.8 pg/mL — ABNORMAL HIGH (ref 0.0–100.0)

## 2022-01-20 LAB — BASIC METABOLIC PANEL
Anion gap: 8 (ref 5–15)
BUN: 26 mg/dL — ABNORMAL HIGH (ref 8–23)
CO2: 27 mmol/L (ref 22–32)
Calcium: 9.5 mg/dL (ref 8.9–10.3)
Chloride: 106 mmol/L (ref 98–111)
Creatinine, Ser: 1.26 mg/dL — ABNORMAL HIGH (ref 0.44–1.00)
GFR, Estimated: 40 mL/min — ABNORMAL LOW (ref >60)
Glucose, Bld: 110 mg/dL — ABNORMAL HIGH (ref 70–99)
Potassium: 3.5 mmol/L (ref 3.5–5.1)
Sodium: 141 mmol/L (ref 135–145)

## 2022-01-20 NOTE — ED Triage Notes (Signed)
Pt reports with abnormal O2 levels. Pt reports that her oxygen was in the 80's today. Pt is not normally on home oxygen. Pt's daughter states that her hemoglobin was 9.

## 2022-01-20 NOTE — ED Provider Triage Note (Signed)
Emergency Medicine Provider Triage Evaluation Note  Carla Taylor , a 88 y.o. female  was evaluated in triage.  Pt complains of abnormal lab.  Patient states that he was seen by physical therapy today, noted to be hypoxic on room air.  Patient was then seen at PCP and noted to have low hemoglobin of 9.6.  Patient endorsing fatigue for the last 2 weeks.  Patient also noting shortness of breath.  Patient reports that she recently was diagnosed with pneumonia back in April however was placed on antibiotics and took them to completion.  Patient denies any recent fevers or does not was cough.  Review of Systems  Positive:  Negative:   Physical Exam  BP (!) 164/60 (BP Location: Left Arm)   Pulse 81   Temp 98.4 F (36.9 C) (Oral)   Resp 18   Ht '5\' 2"'$  (1.575 m)   Wt 86.6 kg   SpO2 94%   BMI 34.93 kg/m  Gen:   Awake, no distress   Resp:  Normal effort  MSK:   Moves extremities without difficulty  Other:  Lung sounds clear.  Abdomen soft compressible.  Medical Decision Making  Medically screening exam initiated at 7:51 PM.  Appropriate orders placed.  Carla Taylor was informed that the remainder of the evaluation will be completed by another provider, this initial triage assessment does not replace that evaluation, and the importance of remaining in the ED until their evaluation is complete.     Azucena Cecil, PA-C 01/20/22 1954

## 2022-01-20 NOTE — ED Notes (Signed)
Blue top sent down

## 2022-01-21 ENCOUNTER — Other Ambulatory Visit (HOSPITAL_COMMUNITY): Payer: Medicare Other

## 2022-01-21 DIAGNOSIS — D649 Anemia, unspecified: Secondary | ICD-10-CM | POA: Diagnosis not present

## 2022-01-21 DIAGNOSIS — Z79890 Hormone replacement therapy (postmenopausal): Secondary | ICD-10-CM | POA: Diagnosis not present

## 2022-01-21 DIAGNOSIS — Z79899 Other long term (current) drug therapy: Secondary | ICD-10-CM | POA: Diagnosis not present

## 2022-01-21 DIAGNOSIS — Z7901 Long term (current) use of anticoagulants: Secondary | ICD-10-CM | POA: Diagnosis not present

## 2022-01-21 DIAGNOSIS — I130 Hypertensive heart and chronic kidney disease with heart failure and stage 1 through stage 4 chronic kidney disease, or unspecified chronic kidney disease: Secondary | ICD-10-CM | POA: Diagnosis present

## 2022-01-21 DIAGNOSIS — H9190 Unspecified hearing loss, unspecified ear: Secondary | ICD-10-CM | POA: Diagnosis present

## 2022-01-21 DIAGNOSIS — J189 Pneumonia, unspecified organism: Secondary | ICD-10-CM | POA: Diagnosis present

## 2022-01-21 DIAGNOSIS — I5031 Acute diastolic (congestive) heart failure: Secondary | ICD-10-CM | POA: Diagnosis not present

## 2022-01-21 DIAGNOSIS — I5033 Acute on chronic diastolic (congestive) heart failure: Secondary | ICD-10-CM | POA: Diagnosis present

## 2022-01-21 DIAGNOSIS — J690 Pneumonitis due to inhalation of food and vomit: Secondary | ICD-10-CM | POA: Diagnosis not present

## 2022-01-21 DIAGNOSIS — E669 Obesity, unspecified: Secondary | ICD-10-CM | POA: Diagnosis present

## 2022-01-21 DIAGNOSIS — J9601 Acute respiratory failure with hypoxia: Secondary | ICD-10-CM | POA: Diagnosis not present

## 2022-01-21 DIAGNOSIS — N1832 Chronic kidney disease, stage 3b: Secondary | ICD-10-CM | POA: Diagnosis present

## 2022-01-21 DIAGNOSIS — H3530 Unspecified macular degeneration: Secondary | ICD-10-CM | POA: Diagnosis present

## 2022-01-21 DIAGNOSIS — Z66 Do not resuscitate: Secondary | ICD-10-CM | POA: Diagnosis not present

## 2022-01-21 DIAGNOSIS — M1990 Unspecified osteoarthritis, unspecified site: Secondary | ICD-10-CM | POA: Diagnosis present

## 2022-01-21 DIAGNOSIS — Z96651 Presence of right artificial knee joint: Secondary | ICD-10-CM | POA: Diagnosis present

## 2022-01-21 DIAGNOSIS — D62 Acute posthemorrhagic anemia: Secondary | ICD-10-CM | POA: Diagnosis present

## 2022-01-21 DIAGNOSIS — Z6834 Body mass index (BMI) 34.0-34.9, adult: Secondary | ICD-10-CM | POA: Diagnosis not present

## 2022-01-21 DIAGNOSIS — Z7982 Long term (current) use of aspirin: Secondary | ICD-10-CM | POA: Diagnosis not present

## 2022-01-21 DIAGNOSIS — E039 Hypothyroidism, unspecified: Secondary | ICD-10-CM | POA: Diagnosis present

## 2022-01-21 DIAGNOSIS — E538 Deficiency of other specified B group vitamins: Secondary | ICD-10-CM | POA: Diagnosis present

## 2022-01-21 DIAGNOSIS — E876 Hypokalemia: Secondary | ICD-10-CM | POA: Diagnosis not present

## 2022-01-21 DIAGNOSIS — R339 Retention of urine, unspecified: Secondary | ICD-10-CM | POA: Diagnosis present

## 2022-01-21 DIAGNOSIS — E782 Mixed hyperlipidemia: Secondary | ICD-10-CM | POA: Diagnosis present

## 2022-01-21 DIAGNOSIS — D631 Anemia in chronic kidney disease: Secondary | ICD-10-CM | POA: Diagnosis present

## 2022-01-21 DIAGNOSIS — E785 Hyperlipidemia, unspecified: Secondary | ICD-10-CM | POA: Diagnosis present

## 2022-01-21 LAB — CBC WITH DIFFERENTIAL/PLATELET
Abs Immature Granulocytes: 0.05 10*3/uL (ref 0.00–0.07)
Basophils Absolute: 0.1 10*3/uL (ref 0.0–0.1)
Basophils Relative: 1 %
Eosinophils Absolute: 0.1 10*3/uL (ref 0.0–0.5)
Eosinophils Relative: 1 %
HCT: 28.2 % — ABNORMAL LOW (ref 36.0–46.0)
Hemoglobin: 8.9 g/dL — ABNORMAL LOW (ref 12.0–15.0)
Immature Granulocytes: 1 %
Lymphocytes Relative: 23 %
Lymphs Abs: 2 10*3/uL (ref 0.7–4.0)
MCH: 30.4 pg (ref 26.0–34.0)
MCHC: 31.6 g/dL (ref 30.0–36.0)
MCV: 96.2 fL (ref 80.0–100.0)
Monocytes Absolute: 0.9 10*3/uL (ref 0.1–1.0)
Monocytes Relative: 10 %
Neutro Abs: 5.5 10*3/uL (ref 1.7–7.7)
Neutrophils Relative %: 64 %
Platelets: 194 10*3/uL (ref 150–400)
RBC: 2.93 MIL/uL — ABNORMAL LOW (ref 3.87–5.11)
RDW: 13.1 % (ref 11.5–15.5)
WBC: 8.6 10*3/uL (ref 4.0–10.5)
nRBC: 0 % (ref 0.0–0.2)

## 2022-01-21 LAB — COMPREHENSIVE METABOLIC PANEL
ALT: 11 U/L (ref 0–44)
AST: 14 U/L — ABNORMAL LOW (ref 15–41)
Albumin: 3.1 g/dL — ABNORMAL LOW (ref 3.5–5.0)
Alkaline Phosphatase: 67 U/L (ref 38–126)
Anion gap: 8 (ref 5–15)
BUN: 28 mg/dL — ABNORMAL HIGH (ref 8–23)
CO2: 28 mmol/L (ref 22–32)
Calcium: 8.8 mg/dL — ABNORMAL LOW (ref 8.9–10.3)
Chloride: 107 mmol/L (ref 98–111)
Creatinine, Ser: 1.08 mg/dL — ABNORMAL HIGH (ref 0.44–1.00)
GFR, Estimated: 48 mL/min — ABNORMAL LOW (ref >60)
Glucose, Bld: 94 mg/dL (ref 70–99)
Potassium: 3.2 mmol/L — ABNORMAL LOW (ref 3.5–5.1)
Sodium: 143 mmol/L (ref 135–145)
Total Bilirubin: 0.5 mg/dL (ref 0.3–1.2)
Total Protein: 6.4 g/dL — ABNORMAL LOW (ref 6.5–8.1)

## 2022-01-21 LAB — POC OCCULT BLOOD, ED: Fecal Occult Bld: NEGATIVE

## 2022-01-21 LAB — MAGNESIUM: Magnesium: 1.9 mg/dL (ref 1.7–2.4)

## 2022-01-21 LAB — PHOSPHORUS: Phosphorus: 3.5 mg/dL (ref 2.5–4.6)

## 2022-01-21 LAB — PROCALCITONIN: Procalcitonin: <0.1 ng/mL

## 2022-01-21 MED ORDER — SODIUM CHLORIDE 0.9 % IV SOLN
1.0000 g | INTRAVENOUS | Status: DC
  Administered 2022-01-22 – 2022-01-23 (×2): 1 g via INTRAVENOUS
  Filled 2022-01-21 (×2): qty 10

## 2022-01-21 MED ORDER — GUAIFENESIN-DM 100-10 MG/5ML PO SYRP
5.0000 mL | ORAL_SOLUTION | ORAL | Status: DC | PRN
  Administered 2022-01-23: 5 mL via ORAL
  Filled 2022-01-21: qty 10

## 2022-01-21 MED ORDER — ACETAMINOPHEN 325 MG PO TABS
650.0000 mg | ORAL_TABLET | Freq: Four times a day (QID) | ORAL | Status: DC | PRN
  Administered 2022-01-23: 650 mg via ORAL
  Filled 2022-01-21 (×2): qty 2

## 2022-01-21 MED ORDER — DOXAZOSIN MESYLATE 2 MG PO TABS
2.0000 mg | ORAL_TABLET | Freq: Every day | ORAL | Status: DC
  Administered 2022-01-21 – 2022-01-23 (×3): 2 mg via ORAL
  Filled 2022-01-21 (×3): qty 1

## 2022-01-21 MED ORDER — AZITHROMYCIN 500 MG IV SOLR
500.0000 mg | INTRAVENOUS | Status: DC
Start: 2022-01-22 — End: 2022-01-22
  Administered 2022-01-22: 500 mg via INTRAVENOUS
  Filled 2022-01-21: qty 5

## 2022-01-21 MED ORDER — ONDANSETRON HCL 4 MG/2ML IJ SOLN: 4.0000 mg | Freq: Four times a day (QID) | INTRAMUSCULAR | Status: DC | PRN

## 2022-01-21 MED ORDER — POLYETHYLENE GLYCOL 3350 17 G PO PACK: 17.0000 g | PACK | Freq: Every day | ORAL | Status: DC | PRN

## 2022-01-21 MED ORDER — LEVOTHYROXINE SODIUM 88 MCG PO TABS
88.0000 ug | ORAL_TABLET | Freq: Every day | ORAL | Status: DC
  Administered 2022-01-21 – 2022-01-23 (×3): 88 ug via ORAL
  Filled 2022-01-21 (×3): qty 1

## 2022-01-21 MED ORDER — CYANOCOBALAMIN 500 MCG PO TABS
500.0000 ug | ORAL_TABLET | Freq: Every day | ORAL | Status: DC
  Administered 2022-01-21 – 2022-01-23 (×3): 500 ug via ORAL
  Filled 2022-01-21 (×3): qty 1

## 2022-01-21 MED ORDER — OXYCODONE HCL 5 MG PO TABS
5.0000 mg | ORAL_TABLET | Freq: Four times a day (QID) | ORAL | Status: DC | PRN
  Administered 2022-01-21: 5 mg via ORAL
  Filled 2022-01-21: qty 1

## 2022-01-21 MED ORDER — SODIUM CHLORIDE 0.9 % IV SOLN
500.0000 mg | Freq: Once | INTRAVENOUS | Status: AC
  Administered 2022-01-21: 500 mg via INTRAVENOUS
  Filled 2022-01-21: qty 5

## 2022-01-21 MED ORDER — POTASSIUM CHLORIDE CRYS ER 20 MEQ PO TBCR
40.0000 meq | EXTENDED_RELEASE_TABLET | Freq: Once | ORAL | Status: AC
  Administered 2022-01-21: 40 meq via ORAL
  Filled 2022-01-21: qty 2

## 2022-01-21 MED ORDER — FUROSEMIDE 10 MG/ML IJ SOLN
40.0000 mg | Freq: Every day | INTRAMUSCULAR | Status: DC
  Administered 2022-01-21 – 2022-01-23 (×3): 40 mg via INTRAVENOUS
  Filled 2022-01-21 (×3): qty 4

## 2022-01-21 MED ORDER — LACTATED RINGERS IV SOLN: INTRAVENOUS | Status: DC

## 2022-01-21 MED ORDER — SODIUM CHLORIDE 0.9 % IV SOLN
1.0000 g | Freq: Once | INTRAVENOUS | Status: AC
  Administered 2022-01-21: 1 g via INTRAVENOUS
  Filled 2022-01-21: qty 10

## 2022-01-21 MED ORDER — ENOXAPARIN SODIUM 30 MG/0.3ML IJ SOSY
30.0000 mg | PREFILLED_SYRINGE | INTRAMUSCULAR | Status: DC
  Administered 2022-01-21 – 2022-01-22 (×2): 30 mg via SUBCUTANEOUS
  Filled 2022-01-21 (×2): qty 0.3

## 2022-01-21 MED ORDER — MELATONIN 5 MG PO TABS: 5.0000 mg | ORAL_TABLET | Freq: Every evening | ORAL | Status: DC | PRN

## 2022-01-21 MED ORDER — METOPROLOL SUCCINATE ER 50 MG PO TB24
75.0000 mg | ORAL_TABLET | Freq: Every day | ORAL | Status: DC
  Administered 2022-01-21 – 2022-01-23 (×3): 75 mg via ORAL
  Filled 2022-01-21: qty 1
  Filled 2022-01-21: qty 2
  Filled 2022-01-21: qty 1

## 2022-01-21 MED ORDER — CHLORHEXIDINE GLUCONATE CLOTH 2 % EX PADS
6.0000 | MEDICATED_PAD | Freq: Every day | CUTANEOUS | Status: DC
  Administered 2022-01-21 – 2022-01-23 (×3): 6 via TOPICAL

## 2022-01-21 NOTE — Progress Notes (Signed)
PROGRESS NOTE        PATIENT DETAILS Name: Carla Taylor Age: 88 y.o. Sex: female Date of Birth: 27-Feb-1925 Admit Date: 01/20/2022 Admitting Physician Evalee Mutton Kristeen Mans, MD [id number], Shanon Brow, MD  Brief Summary: Patient is a 88 y.o.  female with history of HTN, hypothyroidism, HLD, CKD stage IIIb-who presented with exertional dyspnea/weakness/worsening lower extremity edema-treated for community-acquired pneumonia approximately a month or so ago-was referred to the ED by her home health aide/RN due to intermittent hypoxemia at home-she was found to have possible recurrent PNA/HFpEF and subsequently admitted to the hospitalist service.  See below for further details   Significant events: 7/3>> admit for hypoxia from PNA/possible HFpEF exacerbation.  Significant studies: 7/3>> CXR: Left> right bibasilar airspace disease.  Significant microbiology data:   Procedures: None  Consults: None  Subjective: Appears comfortable at rest-on 2 L of oxygen.  But still acknowledges ongoing SOB with minimal movement.  Objective: Vitals: Blood pressure (!) 153/81, pulse 63, temperature (!) 97.5 F (36.4 C), temperature source Oral, resp. rate (!) 24, height '5\' 2"'$  (1.575 m), weight 86.6 kg, SpO2 96 %.   Exam: Gen Exam:Alert awake-not in any distress HEENT:atraumatic, normocephalic Chest: Few bibasilar rales. CVS:S1S2 regular Abdomen:soft non tender, non distended Extremities:+++ edema Neurology: Non focal Skin: no rash  Pertinent Labs/Radiology:    Latest Ref Rng & Units 01/21/2022    5:30 AM 01/20/2022    9:24 PM 11/08/2021    9:00 PM  CBC  WBC 4.0 - 10.5 K/uL 8.6  9.1  9.0   Hemoglobin 12.0 - 15.0 g/dL 8.9  9.3  11.7   Hematocrit 36.0 - 46.0 % 28.2  29.5  36.1   Platelets 150 - 400 K/uL 194  201  203     Lab Results  Component Value Date   NA 143 01/21/2022   K 3.2 (L) 01/21/2022   CL 107 01/21/2022   CO2 28 01/21/2022      Assessment/Plan: Acute  hypoxic respiratory failure: Suspect due to combination of possible PNA and HFpEF exacerbation.  Continue antibiotics-add IV Lasix-attempt to titrate off oxygen.  PNA:?  Aspiration-appreciate SLP eval-continue Rocephin/azithromycin-follow cultures.    HFpEF exacerbation: Significant lower extremity edema-has bilateral rales-elevated BNP-starting IV Lasix-continue beta-blocker-check echo.  Follow electrolytes, intake/output  HTN: BP stable-continue metoprolol, Cardura-hold amlodipine given significant lower extremity edema.  HCTZ on hold as patient now on furosemide.  Hypokalemia: Replete and recheck.  Normocytic anemia: Unclear etiology-no evidence of blood loss-FOBT negative-follow CBC and transfuse if significant drop.  Panel with a.m. labs  CKD stage IIIb: At baseline-follow closely while on diuretics.  Chronic indwelling Foley catheter: Placed approximately 6 weeks back-catheter being continued-attempts for voiding trial will be deferred to the outpatient setting.  Hypothyroidism: Continue levothyroxine  Obesity: Estimated body mass index is 34.93 kg/m as calculated from the following:   Height as of this encounter: '5\' 2"'$  (1.575 m).   Weight as of this encounter: 86.6 kg.   Code status:   Code Status: DNR (confirmed with patient and then with daughter at bedside)  DVT Prophylaxis: enoxaparin (LOVENOX) injection 30 mg Start: 01/21/22 1000   Family Communication: Daughter-Jane-at bedside.   Disposition Plan: Status is: Inpatient Remains inpatient appropriate because: Hypoxia due to PNA/CHF-on IV antibiotics-IV Lasix-noted stable for discharge-suspect require several days of hospitalization.   Planned Discharge Destination:Home health   Diet: Diet Order  Diet Heart Room service appropriate? Yes; Fluid consistency: Thin  Diet effective now                     Antimicrobial agents: Anti-infectives (From admission, onward)    Start     Dose/Rate Route  Frequency Ordered Stop   01/22/22 0200  cefTRIAXone (ROCEPHIN) 1 g in sodium chloride 0.9 % 100 mL IVPB        1 g 200 mL/hr over 30 Minutes Intravenous Every 24 hours 01/21/22 0446     01/22/22 0200  azithromycin (ZITHROMAX) 500 mg in sodium chloride 0.9 % 250 mL IVPB        500 mg 250 mL/hr over 60 Minutes Intravenous Every 24 hours 01/21/22 0446     01/21/22 0200  cefTRIAXone (ROCEPHIN) 1 g in sodium chloride 0.9 % 100 mL IVPB        1 g 200 mL/hr over 30 Minutes Intravenous  Once 01/21/22 0145 01/21/22 0236   01/21/22 0200  azithromycin (ZITHROMAX) 500 mg in sodium chloride 0.9 % 250 mL IVPB        500 mg 250 mL/hr over 60 Minutes Intravenous  Once 01/21/22 0145 01/21/22 0349        MEDICATIONS: Scheduled Meds:  doxazosin  2 mg Oral Daily   enoxaparin (LOVENOX) injection  30 mg Subcutaneous Q24H   furosemide  40 mg Intravenous Daily   levothyroxine  88 mcg Oral Q0600   metoprolol succinate  75 mg Oral Daily   vitamin B-12  500 mcg Oral Daily   Continuous Infusions:  [START ON 01/22/2022] azithromycin     [START ON 01/22/2022] cefTRIAXone (ROCEPHIN)  IV     PRN Meds:.acetaminophen, guaiFENesin-dextromethorphan, melatonin, ondansetron (ZOFRAN) IV, oxyCODONE, polyethylene glycol   I have personally reviewed following labs and imaging studies  LABORATORY DATA: CBC: Recent Labs  Lab 01/20/22 2124 01/21/22 0530  WBC 9.1 8.6  NEUTROABS  --  5.5  HGB 9.3* 8.9*  HCT 29.5* 28.2*  MCV 97.0 96.2  PLT 201 326    Basic Metabolic Panel: Recent Labs  Lab 01/20/22 2016 01/21/22 0530  NA 141 143  K 3.5 3.2*  CL 106 107  CO2 27 28  GLUCOSE 110* 94  BUN 26* 28*  CREATININE 1.26* 1.08*  CALCIUM 9.5 8.8*  MG  --  1.9  PHOS  --  3.5    GFR: Estimated Creatinine Clearance: 32.5 mL/min (A) (by C-G formula based on SCr of 1.08 mg/dL (H)).  Liver Function Tests: Recent Labs  Lab 01/21/22 0530  AST 14*  ALT 11  ALKPHOS 67  BILITOT 0.5  PROT 6.4*  ALBUMIN 3.1*   No  results for input(s): "LIPASE", "AMYLASE" in the last 168 hours. No results for input(s): "AMMONIA" in the last 168 hours.  Coagulation Profile: No results for input(s): "INR", "PROTIME" in the last 168 hours.  Cardiac Enzymes: No results for input(s): "CKTOTAL", "CKMB", "CKMBINDEX", "TROPONINI" in the last 168 hours.  BNP (last 3 results) No results for input(s): "PROBNP" in the last 8760 hours.  Lipid Profile: No results for input(s): "CHOL", "HDL", "LDLCALC", "TRIG", "CHOLHDL", "LDLDIRECT" in the last 72 hours.  Thyroid Function Tests: No results for input(s): "TSH", "T4TOTAL", "FREET4", "T3FREE", "THYROIDAB" in the last 72 hours.  Anemia Panel: No results for input(s): "VITAMINB12", "FOLATE", "FERRITIN", "TIBC", "IRON", "RETICCTPCT" in the last 72 hours.  Urine analysis:    Component Value Date/Time   COLORURINE YELLOW 07/19/2016 1528   APPEARANCEUR CLOUDY (A)  07/19/2016 1528   LABSPEC 1.019 07/19/2016 1528   PHURINE 7.0 07/19/2016 1528   GLUCOSEU NEGATIVE 07/19/2016 [place] 07/19/2016 [place] 07/19/2016 [place] 07/19/2016 1528   PROTEINUR NEGATIVE 07/19/2016 1528   UROBILINOGEN 0.2 04/28/2014 1733   NITRITE NEGATIVE 07/19/2016 1528   LEUKOCYTESUR MODERATE (A) 07/19/2016 1528    Sepsis Labs: Lactic Acid, Venous    Component Value Date/Time   LATICACIDVEN 1.0 11/08/2021 2100    MICROBIOLOGY: No results found for this or any previous visit (from the past 240 hour(s)).  RADIOLOGY STUDIES/RESULTS: DG Chest 2 View  Result Date: 01/20/2022 CLINICAL DATA:  Shortness of breath EXAM: CHEST - 2 VIEW COMPARISON:  11/08/2021 FINDINGS: Right shoulder replacement. Hazy ill-defined left greater than right basilar airspace disease. Possible tiny effusions. Stable cardiomediastinal silhouette with aortic atherosclerosis. No pneumothorax IMPRESSION: 1. Ill-defined hazy left greater than right basilar airspace disease which may be  due to atelectasis or pneumonia 2. Possible tiny pleural effusions Electronically Signed   By: Donavan Foil M.D.   On: 01/20/2022 20:19     LOS: 0 days   Oren Binet, MD  [facility]    To contact the attending provider between 7A-7P or the covering provider during after hours 7P-7A, please log into the web site [email or web address] and access using universal [place] password for that web site. If you do not have the password, please call the hospital operator.  01/21/2022, 2:36 PM

## 2022-01-21 NOTE — ED Notes (Signed)
Ambulated pt from bed 18 to RM 25 and back (approx 100 ft) on room air, pt used her walker. Lowest sats was 89% on RA, quickly recover to 91% on RA when she return to sit on pt's bed...Marland KitchenMarland KitchenI did place her back on 2L for comfort. Dr. Karle Starch made aware.

## 2022-01-21 NOTE — ED Notes (Signed)
Pt appears to be sleeping, even RR and unlabored, NAD noted, remains in hall bed with family at the bedside, side rails up x2 for safety, care on going, will continue to monitor.

## 2022-01-21 NOTE — Progress Notes (Signed)
01/21/22 1300  SLP Visit Information  SLP Received On 01/21/22  Subjective  Subjective pt awake in bed, daughter, Opal Sidles, with her; pt eating lunch  Patient/Family Stated Goal to go home  General Information  Date of Onset 01/21/22  HPI 88 yo female adm to [facility] ED with shortness of breath.  Imaging concerning for ATX vs pna, possible small pleural effusions. Pt has h/o macular degeneration, HTN, HLD, CKD, hypothyroidism, mixed incontinence.  She endorses occasional reflux for which she takes Tums.  She denies any significant reflux episode PTA.  Swallow eval ordered.  Type of Study Bedside Swallow Evaluation  Previous Swallow Assessment none in epic  Diet Prior to this Study Regular;Thin liquids  Temperature Spikes Noted No  Respiratory Status Nasal cannula  History of Recent Intubation No  Behavior/Cognition Alert;Pleasant mood;Cooperative  Oral Cavity Assessment WFL  Oral Care Completed by SLP No  Oral Cavity - Dentition Adequate natural dentition (pt reports havng right sided dental work recently, thus mild soreness)  Vision Functional for self-feeding  Self-Feeding Abilities Able to feed self  Patient Positioning Upright in bed  Baseline Vocal Quality Normal  Volitional Cough Strong  Volitional Swallow Able to elicit  Pain Assessment  Pain Assessment No/denies pain  Oral Assessment (Complete on admission/transfer/every shift)  Oral Assessment  (WDL) WDL  Level of Consciousness Alert  Is patient on any of following O2 devices? None of the above  Nutritional status No high risk factors  Oral Assessment Risk  Low Risk  Oral Motor/Sensory Function  Overall Oral Motor/Sensory Function WFL  Ice Chips  Ice chips NT  Thin Liquid  Thin Liquid WFL (cough x1 with last bolus of liquid - at end of meal)  Nectar Thick Liquid  Nectar Thick Liquid NT  Honey Thick Liquid  Honey Thick Liquid NT  Puree  Puree NT  Solid  Solid WFL  Presentation Self Fed;Spoon  SLP - End of Session   Patient left in bed;with family/visitor present;with call bell/phone within reach  Nurse Communication Other (comment);Diet recommendation  Suspected Esophageal Findings  Suspected Esophageal Findings Globus sensation;Belching  SLP Assessment  Clinical Impression Statement (ACUTE ONLY) Patient with ability to self feed, strong voice and volitional cough. She easily passed the 3 ounce [place] water screen. Notable is pt's chronic throat clearing - which she reports started in April and then resolved. She also endorses voice changes at that time.  Pt admits to occasional issues with reflux, for which she takes Tums.  Denies reflux issues prior to admit.   Pt fed self with adequate oral clearance - observed her consuming approximately 75% of her meal.  Following entire meal, she was noted to belch and have delayed cough x1 of entire session- but denies reflux. Recommended pt monitor her po tolerance, coughing with intake, etc.  No SLP follow up indicated. Marland Kitchen  SLP Visit Diagnosis Dysphagia, unspecified (R13.10)  Impact on safety and function Mild aspiration risk  Other Related Risk Factors History of pneumonia;History of GERD  Swallow Evaluation Recommendations  Liquid Administration via Cup;Straw  Medication Administration Whole meds with liquid  Supervision Patient able to self feed  Compensations Slow rate;Small sips/bites  Postural Changes Seated upright at 90 degrees;Remain upright for at least 30 minutes after po intake  Treatment Plan  Oral Care Recommendations Oral care BID  Treatment Recommendations No treatment recommended at this time  Follow Up Recommendations No SLP follow up  Assistance recommended at discharge Intermittent Supervision/Assistance  Functional Status Assessment Patient has not  had a recent decline in their functional status  Individuals Consulted  Consulted and Agree with Results and Recommendations Patient;Family member/caregiver  Family Member Consulted daughter, Opal Sidles   SLP Time Calculation  SLP Start Time (ACUTE ONLY) 1255  SLP Stop Time (ACUTE ONLY) 1324  SLP Time Calculation (min) (ACUTE ONLY) 29 min  SLP Evaluations  $ SLP Speech Visit 1 Visit  SLP Evaluations  $BSS Swallow 1 Procedure   Kathleen Lime, MS [facility] SLP Acute Rehab Services Office [phone] Pager [phone]

## 2022-01-21 NOTE — H&P (Addendum)
History and Physical  CHE BELOW IOX:[id number] DOB: [birth year] DOA: 01/20/2022  Referring physician: Dr. Karle Starch, [place]  PCP: Antony Contras, MD  Outpatient Specialists: Podiatry Patient coming from: Home  Chief Complaint: Generalized weakness, cough, hypoxia.  HPI: Carla Taylor is a 88 y.o. female with medical history significant for essential hypertension, hypothyroidism, hyperlipidemia, mixed incontinence, CKD 3B, who presented to [facility] ED from home due to generalized weakness, nonproductive cough and new hypoxia.  Recently diagnosed and treated for pneumonia outpatient, completed her course of antibiotics.  Home health services noted that she was hypoxic with O2 saturation in the 80s on room air.    She had a regular checkup with her primary care provider, where she was also noted to be hypoxic.  She had labs drawn.  They returned with drop in hemoglobin 9K from 11K at baseline.  The patient was called at her home and advised to come to the ED for further evaluation.  In the ED, FOBT negative.  She has evidence of residual pneumonia on x-ray.  The patient was started on Rocephin and azithromycin empirically in the ED.  TRH, hospitalist service, was asked to admit  ED Course:  Tmax 98.4, BP 144/72, HR 64, RR 17.  Lab studies significant for BUN 26, creatinine 1.26, GFR 40, at baseline.  BNP 247.  Hemoglobin 9.3, baseline 11.7.  MCV 97.  Review of Systems: Review of systems as noted in the HPI. All other systems reviewed and are negative.   Past Medical History:  Diagnosis Date   Arthritis    Chronic shoulder pain    Hyperlipidemia    Hypertension    Hypothyroid    Macular degeneration    Pancreatitis    Primary localized osteoarthritis of right knee 02/05/2016   Primary osteoarthritis of right shoulder 09/19/2014   Shortness of breath dyspnea    on exertion   Stress bladder incontinence, female    Past Surgical History:  Procedure Laterality Date   carpal tunnel Bilateral     CATARACT EXTRACTION Bilateral    2005   EYE SURGERY     KNEE ARTHROSCOPY Bilateral    2001   No previous surgeries     TOTAL KNEE ARTHROPLASTY Right 02/05/2016   TOTAL KNEE ARTHROPLASTY Right 02/05/2016   Procedure: RIGHT TOTAL KNEE ARTHROPLASTY;  Surgeon: Marchia Bond, MD;  Location: [facility];  Service: Orthopedics;  Laterality: Right;   TOTAL SHOULDER ARTHROPLASTY Right 09/19/2014   Procedure: RIGHT TOTAL SHOULDER ARTHROPLASTY;  Surgeon: Johnny Bridge, MD;  Location: [facility];  Service: Orthopedics;  Laterality: Right;    Social History:  reports that she has never smoked. She has never used smokeless tobacco. She reports that she does not drink alcohol and does not use drugs.   No Known Allergies  Family History  Problem Relation Age of Onset   CVA Mother    CVA Father       Prior to Admission medications   Medication Sig Start Date End Date Taking? Authorizing Provider  Acetaminophen (TYLENOL PO) Take 1 tablet by mouth every 6 (six) hours as needed (pain).   Yes [provider]  amLODipine (NORVASC) 5 MG tablet Take 5 mg by mouth daily.  12/27/15  Yes [provider]  amoxicillin (AMOXIL) 500 MG capsule Take by mouth. Take 2 tablets stat then 1 tablet 4 times a day 01/15/22  Yes [provider]  calcium carbonate (OS-CAL - DOSED IN MG OF ELEMENTAL CALCIUM) 1250 MG tablet Take 1,250  mg by mouth 2 (two) times daily with a meal.    Yes [provider]  dorzolamide-timolol (COSOPT) 22.3-6.8 MG/ML ophthalmic solution Place 1 drop into the left eye 2 (two) times daily. 01/15/22  Yes [provider]  doxazosin (CARDURA) 2 MG tablet TAKE 1 TABLET BY MOUTH  DAILY  PLEASE OBTAIN FUTURE  REFILLS FROM PCP. Patient taking differently: Take 2 mg by mouth daily. 02/19/16  Yes Lelon Perla, MD  hydrochlorothiazide (HYDRODIURIL) 25 MG tablet Take 25 mg by mouth daily.  04/29/14  Yes [provider]  latanoprost (XALATAN) 0.005 % ophthalmic solution  Place 1 drop into the left eye at bedtime.   Yes [provider]  levothyroxine (SYNTHROID, LEVOTHROID) 88 MCG tablet Take 88 mcg by mouth daily before breakfast.   Yes [provider]  lisinopril (PRINIVIL,ZESTRIL) 40 MG tablet TAKE 1 TABLET BY MOUTH  DAILY Patient taking differently: Take 40 mg by mouth daily. 02/19/16  Yes Lelon Perla, MD  metoprolol succinate (TOPROL-XL) 50 MG 24 hr tablet Take 1 and 1/2 tablets by mouth daily. <PLEASE MAKE APPOINTMENT FOR REFILLS> Patient taking differently: Take 75 mg by mouth daily. 04/02/17  Yes Lelon Perla, MD  Multiple Vitamins-Minerals (ICAPS AREDS FORMULA PO) Take 1 tablet by mouth 2 (two) times daily.   Yes [provider]  oxyCODONE-acetaminophen (PERCOCET) 7.5-325 MG tablet Take 1 tablet by mouth every 6 (six) hours as needed for moderate pain or severe pain. 01/15/22  Yes [provider]  vitamin B-12 (CYANOCOBALAMIN) 500 MCG tablet Take 500 mcg by mouth daily.   Yes [provider]  Vitamin D, Ergocalciferol, (DRISDOL) 50000 UNITS CAPS capsule Take 50,000 Units by mouth every Sunday.   Yes [provider]  baclofen (LIORESAL) 10 MG tablet Take 1 tablet (10 mg total) by mouth 3 (three) times daily. As needed for muscle spasm Patient not taking: Reported on 01/21/2022 02/05/16   Marchia Bond, MD  benzonatate (TESSALON) 100 MG capsule Take 1 capsule (100 mg total) by mouth 3 (three) times daily as needed for cough. Patient not taking: Reported on 01/21/2022 11/08/21   Gareth Morgan, MD  cephALEXin (KEFLEX) 500 MG capsule Take 1 capsule (500 mg total) by mouth 2 (two) times daily. Patient not taking: Reported on 01/21/2022 04/12/21   Gardiner Barefoot, DPM  HYDROcodone-acetaminophen [facility]) 10-325 MG tablet Take 1-2 tablets by mouth every 6 (six) hours as needed. Patient not taking: Reported on 01/21/2022 02/05/16   Marchia Bond, MD  ondansetron (ZOFRAN) 4 MG tablet Take 1 tablet (4 mg total) by  mouth every 8 (eight) hours as needed for nausea or vomiting. Patient not taking: Reported on 01/21/2022 07/20/16   Mackuen, Fredia Sorrow, MD  rivaroxaban (XARELTO) 10 MG TABS tablet Take 1 tablet (10 mg total) by mouth daily. Patient not taking: Reported on 01/21/2022 02/05/16   Marchia Bond, MD  sennosides-docusate sodium (SENOKOT-S) 8.6-50 MG tablet Take 2 tablets by mouth daily. Patient not taking: Reported on 01/21/2022 09/19/14   Marchia Bond, MD    Physical Exam: BP (!) 144/72 (BP Location: Right Arm)   Pulse 64   Temp 98.4 F (36.9 C) (Oral)   Resp 17   Ht '5\' 2"'$  (1.575 m)   Wt 86.6 kg   SpO2 97%   BMI 34.93 kg/m   General: 88 y.o. year-old female well developed well nourished in no acute distress.  Alert and interactive.  Hard of hearing. Cardiovascular: Regular rate and rhythm with no rubs or  gallops.  No thyromegaly or JVD noted.  No lower extremity edema bilaterally. Respiratory: Mild rales at bases.  No wheezing noted.  Poor inspiratory effort. Abdomen: Soft nontender nondistended with normal bowel sounds x4 quadrants. Muskuloskeletal: No cyanosis, clubbing or edema noted bilaterally Neuro: CN II-XII intact, strength, sensation, reflexes Skin: No ulcerative lesions noted or rashes Psychiatry: Judgement and insight appear normal. Mood is appropriate for condition and setting          Labs on Admission:  Basic Metabolic Panel: Recent Labs  Lab 01/20/22 2016  NA 141  K 3.5  CL 106  CO2 27  GLUCOSE 110*  BUN 26*  CREATININE 1.26*  CALCIUM 9.5   Liver Function Tests: No results for input(s): "AST", "ALT", "ALKPHOS", "BILITOT", "PROT", "ALBUMIN" in the last 168 hours. No results for input(s): "LIPASE", "AMYLASE" in the last 168 hours. No results for input(s): "AMMONIA" in the last 168 hours. CBC: Recent Labs  Lab 01/20/22 2124  WBC 9.1  HGB 9.3*  HCT 29.5*  MCV 97.0  PLT 201   Cardiac Enzymes: No results for input(s): "CKTOTAL", "CKMB", "CKMBINDEX",  "TROPONINI" in the last 168 hours.  BNP (last 3 results) Recent Labs    11/08/21 2100 01/20/22 2016  BNP 154.4* 247.8*    ProBNP (last 3 results) No results for input(s): "PROBNP" in the last 8760 hours.  CBG: No results for input(s): "GLUCAP" in the last 168 hours.  Radiological Exams on Admission: DG Chest 2 View  Result Date: 01/20/2022 CLINICAL DATA:  Shortness of breath EXAM: CHEST - 2 VIEW COMPARISON:  11/08/2021 FINDINGS: Right shoulder replacement. Hazy ill-defined left greater than right basilar airspace disease. Possible tiny effusions. Stable cardiomediastinal silhouette with aortic atherosclerosis. No pneumothorax IMPRESSION: 1. Ill-defined hazy left greater than right basilar airspace disease which may be due to atelectasis or pneumonia 2. Possible tiny pleural effusions Electronically Signed   By: Donavan Foil M.D.   On: 01/20/2022 20:19    EKG: I independently viewed the EKG done and my findings are as followed: Sinus rhythm rate of 74.  Nonspecific ST-T changes.  QTc 420.  Assessment/Plan Present on Admission:  Acute respiratory failure with hypoxia (HCC)  Principal Problem:   Acute respiratory failure with hypoxia (HCC)  Acute hypoxic respiratory failure secondary to community-acquired pneumonia, POA Not on oxygen supplementation at baseline O2 saturation 88% on room air Currently on 2 L [place] to maintain oxygen saturation greater than 90% Wean off oxygen supplementation as tolerated. Home oxygen evaluation TOC consulted to assist with DME's  Community-acquired pneumonia, POA Recently treated for the same, completed course of oral antibiotics. Obtain baseline Procalcitonin Started on Rocephin and azithromycin in the ED, continue Monitor fever curve and WBC  Acute blood loss anemia of unclear etiology. Anemia of chronic disease with CKD 3B FOBT negative No overt bleeding Obtain iron studies  CKD 3B Appears to be at her baseline creatinine 1.26 with GFR  40. Avoid nephrotoxic agents and dehydration Monitor urine output if able Gentle IV fluid hydration LR 50 cc/h x 1 day. Repeat renal panel in the morning.  Generalized weakness PT OT to assess Fall precautions  Hypothyroidism Resume home levothyroxine  Essential hypertension/B12 deficiency Restart home regimen.    DVT prophylaxis: Subcu Lovenox daily.  Code Status: Full code.  Family Communication: Daughter at bedside.  Disposition Plan: Admitted to telemetry unit.  Consults called: None.  Admission status: Observation status.   Status is: Observation    Kayleen Memos MD [facility] Pager  [phone]  If 7PM-7AM, please contact night-coverage [email or web address] Password [facility]  01/21/2022, 4:48 AM

## 2022-01-21 NOTE — ED Notes (Signed)
ED TO INPATIENT HANDOFF REPORT  Name/Age/Gender Carla Taylor 88 y.o. female  Code Status    Code Status Orders  (From admission, onward)           Start     Ordered   01/21/22 1204  Do not attempt resuscitation (DNR)  Continuous       Question Answer Comment  In the event of cardiac or respiratory ARREST Do not call a "code blue"   In the event of cardiac or respiratory ARREST Do not perform Intubation, CPR, defibrillation or ACLS   In the event of cardiac or respiratory ARREST Use medication by any route, position, wound care, and other measures to relive pain and suffering. May use oxygen, suction and manual treatment of airway obstruction as needed for comfort.      01/21/22 1203           Code Status History     Date Active Date Inactive Code Status Order ID Comments User Context   01/21/2022 0446 01/21/2022 1203 Full Code [id number]  Kayleen Memos, DO ED   02/05/2016 1237 02/07/2016 1824 Full Code [id number]  Marchia Bond, MD Inpatient   09/19/2014 1119 09/21/2014 1520 Full Code [id number]  Johnny Bridge, MD Inpatient   05/05/2014 1021 05/10/2014 1424 Full Code [id number]  Charlynne Cousins, MD Inpatient       Home/SNF/Other Home  Chief Complaint Acute respiratory failure with hypoxia ([place]) [J96.01] PNA (pneumonia) [J18.9]  Level of Care/Admitting Diagnosis ED Disposition     ED Disposition  Admit   Condition  --   Comment  Hospital Area: [facility] [[id number]]  Level of Care: Telemetry [5]  Admit to tele based on following criteria: Acute CHF  May admit patient to [facility] or [facility] if equivalent level of care is available:: Yes  Covid Evaluation: Asymptomatic - no recent exposure (last 10 days) testing not required  Diagnosis: PNA (pneumonia) [017510]  Admitting Physician: Jonetta Osgood [[id number]]  Attending Physician: Jonetta Osgood [[id number]]  Estimated length of stay: past midnight tomorrow  Certification:: I certify this  patient will need inpatient services for at least 2 midnights          Medical History Past Medical History:  Diagnosis Date   Arthritis    Chronic shoulder pain    Hyperlipidemia    Hypertension    Hypothyroid    Macular degeneration    Pancreatitis    Primary localized osteoarthritis of right knee 02/05/2016   Primary osteoarthritis of right shoulder 09/19/2014   Shortness of breath dyspnea    on exertion   Stress bladder incontinence, female     Allergies No Known Allergies  IV Location/Drains/Wounds Patient Lines/Drains/Airways Status     Active Line/Drains/Airways     Name Placement date Placement time Site Days   Peripheral IV 01/21/22 20 G Posterior;Right Wrist 01/21/22  0158  Wrist  less than 1   Urethral Catheter Inserted [facility] at [facility] 01/21/22  1230  --  less than 1   Incision (Closed) 02/05/16 Knee Right 02/05/16  1002  -- 2177            Labs/Imaging Results for orders placed or performed during the hospital encounter of 01/20/22 (from the past 48 hour(s))  Basic metabolic panel     Status: Abnormal   Collection Time: 01/20/22  8:16 PM  Result Value Ref Range   Sodium 141 135 - 145 mmol/L   Potassium 3.5 3.5 -  5.1 mmol/L   Chloride 106 98 - 111 mmol/L   CO2 27 22 - 32 mmol/L   Glucose, Bld 110 (H) 70 - 99 mg/dL    Comment: Glucose reference range applies only to samples taken after fasting for at least 8 hours.   BUN 26 (H) 8 - 23 mg/dL   Creatinine, Ser 1.26 (H) 0.44 - 1.00 mg/dL   Calcium 9.5 8.9 - 10.3 mg/dL   GFR, Estimated 40 (L) >60 mL/min    Comment: (NOTE) Calculated using the CKD-EPI Creatinine Equation (2021)    Anion gap 8 5 - 15    Comment: Performed at [facility], [place] [address] [place], [place] [address]  Brain natriuretic peptide     Status: Abnormal   Collection Time: 01/20/22  8:16 PM  Result Value Ref Range   B Natriuretic Peptide 247.8 (H) 0.0 - 100.0 pg/mL    Comment: Performed at [facility], [place] [address] [place], [place] [address]  CBC     Status: Abnormal   Collection Time: 01/20/22  9:24 PM  Result Value Ref Range   WBC 9.1 4.0 - 10.5 K/uL   RBC 3.04 (L) 3.87 - 5.11 MIL/uL   Hemoglobin 9.3 (L) 12.0 - 15.0 g/dL   HCT 29.5 (L) 36.0 - 46.0 %   MCV 97.0 80.0 - 100.0 fL   MCH 30.6 26.0 - 34.0 pg   MCHC 31.5 30.0 - 36.0 g/dL   RDW 13.0 11.5 - 15.5 %   Platelets 201 150 - 400 K/uL   nRBC 0.0 0.0 - 0.2 %    Comment: Performed at [facility], [place] [address], [place], [place] [address]  POC occult blood, ED     Status: None   Collection Time: 01/21/22  1:34 AM  Result Value Ref Range   Fecal Occult Bld NEGATIVE NEGATIVE  CBC with Differential/Platelet     Status: Abnormal   Collection Time: 01/21/22  5:30 AM  Result Value Ref Range   WBC 8.6 4.0 - 10.5 K/uL   RBC 2.93 (L) 3.87 - 5.11 MIL/uL   Hemoglobin 8.9 (L) 12.0 - 15.0 g/dL   HCT 28.2 (L) 36.0 - 46.0 %   MCV 96.2 80.0 - 100.0 fL   MCH 30.4 26.0 - 34.0 pg   MCHC 31.6 30.0 - 36.0 g/dL   RDW 13.1 11.5 - 15.5 %   Platelets 194 150 - 400 K/uL   nRBC 0.0 0.0 - 0.2 %   Neutrophils Relative % 64 %   Neutro Abs 5.5 1.7 - 7.7 K/uL   Lymphocytes Relative 23 %   Lymphs Abs 2.0 0.7 - 4.0 K/uL   Monocytes Relative 10 %   Monocytes Absolute 0.9 0.1 - 1.0 K/uL   Eosinophils Relative 1 %   Eosinophils Absolute 0.1 0.0 - 0.5 K/uL   Basophils Relative 1 %   Basophils Absolute 0.1 0.0 - 0.1 K/uL   Immature Granulocytes 1 %   Abs Immature Granulocytes 0.05 0.00 - 0.07 K/uL    Comment: Performed at [facility], [place] [address] [place],  [address]  Comprehensive metabolic panel     Status: Abnormal   Collection Time: 01/21/22  5:30 AM  Result Value Ref Range   Sodium 143 135 - 145 mmol/L   Potassium 3.2 (L) 3.5 - 5.1 mmol/L   Chloride 107 98 - 111 mmol/L   CO2 28 22 - 32 mmol/L   Glucose, Bld 94  70 - 99 mg/dL    Comment: Glucose reference range applies only to samples  taken after fasting for at least 8 hours.   BUN 28 (H) 8 - 23 mg/dL   Creatinine, Ser 1.08 (H) 0.44 - 1.00 mg/dL   Calcium 8.8 (L) 8.9 - 10.3 mg/dL   Total Protein 6.4 (L) 6.5 - 8.1 g/dL   Albumin 3.1 (L) 3.5 - 5.0 g/dL   AST 14 (L) 15 - 41 U/L   ALT 11 0 - 44 U/L   Alkaline Phosphatase 67 38 - 126 U/L   Total Bilirubin 0.5 0.3 - 1.2 mg/dL   GFR, Estimated 48 (L) >60 mL/min    Comment: (NOTE) Calculated using the CKD-EPI Creatinine Equation (2021)    Anion gap 8 5 - 15    Comment: Performed at [facility], [place] [address], [place], [place] [address]  Magnesium     Status: None   Collection Time: 01/21/22  5:30 AM  Result Value Ref Range   Magnesium 1.9 1.7 - 2.4 mg/dL    Comment: Performed at [facility], [place] [address] [place], [place] [address]  Phosphorus     Status: None   Collection Time: 01/21/22  5:30 AM  Result Value Ref Range   Phosphorus 3.5 2.5 - 4.6 mg/dL    Comment: Performed at [facility], [place] [address] [place],  [address]  Procalcitonin - Baseline     Status: None   Collection Time: 01/21/22  5:30 AM  Result Value Ref Range   Procalcitonin <0.10 ng/mL    Comment:        Interpretation: PCT (Procalcitonin) <= 0.5 ng/mL: Systemic infection (sepsis) is not likely. Local bacterial infection is possible. (NOTE)       Sepsis PCT Algorithm           Lower Respiratory Tract                                      Infection PCT Algorithm    ----------------------------     ----------------------------         PCT < 0.25 ng/mL                PCT < 0.10 ng/mL          Strongly encourage             Strongly discourage   discontinuation of antibiotics    initiation of antibiotics    ----------------------------     -----------------------------       PCT 0.25 - 0.50 ng/mL            PCT 0.10 - 0.25 ng/mL               OR       >80% decrease in PCT            Discourage initiation of                                             antibiotics      Encourage discontinuation           of antibiotics    ----------------------------     -----------------------------         PCT >= 0.50 ng/mL  PCT 0.26 - 0.50 ng/mL               AND        <80% decrease in PCT             Encourage initiation of                                             antibiotics       Encourage continuation           of antibiotics    ----------------------------     -----------------------------        PCT >= 0.50 ng/mL                  PCT > 0.50 ng/mL               AND         increase in PCT                  Strongly encourage                                      initiation of antibiotics    Strongly encourage escalation           of antibiotics                                     -----------------------------                                           PCT <= 0.25 ng/mL                                                 OR                                        > 80% decrease in PCT                                      Discontinue / Do not initiate                                             antibiotics  Performed at [place] [address] [place], [place] [address]    DG Chest 2 View  Result Date: 01/20/2022 CLINICAL DATA:  Shortness of breath EXAM: CHEST - 2 VIEW COMPARISON:  11/08/2021 FINDINGS: Right shoulder replacement. Hazy ill-defined left greater than right basilar airspace disease. Possible tiny effusions. Stable cardiomediastinal silhouette with aortic atherosclerosis. No pneumothorax IMPRESSION: 1. Ill-defined hazy left greater than right basilar airspace disease which may be due to atelectasis or pneumonia  2. Possible tiny pleural effusions Electronically Signed   By: Donavan Foil M.D.   On: 01/20/2022 20:19    Pending Labs Unresulted Labs (From admission, onward)     Start     Ordered   01/28/22 0500  Creatinine, serum  (enoxaparin (LOVENOX)    CrCl >/= 30 ml/min)   Weekly,   R     Comments: while on enoxaparin therapy    01/21/22 0446   01/22/22 0500  CBC  Tomorrow morning,   R        01/21/22 0704   01/22/22 0500  Magnesium  Tomorrow morning,   R        01/21/22 0704   01/22/22 4098  Basic metabolic panel  Tomorrow morning,   R        01/21/22 0704   01/21/22 0525  Iron and TIBC  Add-on,   AD        01/21/22 0524   01/21/22 0525  Ferritin  Add-on,   AD        01/21/22 0524            Vitals/Pain Today's Vitals   01/21/22 1647 01/21/22 1700 01/21/22 1900 01/21/22 1935  BP:  (!) 145/56 (!) 146/61   Pulse:  62 67   Resp:  18 15   Temp:    98.5 F (36.9 C)  TempSrc:    Oral  SpO2:  97% 98%   Weight:      Height:      PainSc: 8        Isolation Precautions No active isolations  Medications Medications  enoxaparin (LOVENOX) injection 30 mg (30 mg Subcutaneous Given 01/21/22 1104)  cefTRIAXone (ROCEPHIN) 1 g in sodium chloride 0.9 % 100 mL IVPB (has no administration in time range)  azithromycin (ZITHROMAX) 500 mg in sodium chloride 0.9 % 250 mL IVPB (has no administration in time range)  doxazosin (CARDURA) tablet 2 mg (2 mg Oral Given 01/21/22 1101)  levothyroxine (SYNTHROID) tablet 88 mcg (88 mcg Oral Given 01/21/22 0542)  metoprolol succinate (TOPROL-XL) 24 hr tablet 75 mg (75 mg Oral Given 01/21/22 1100)  vitamin B-12 (CYANOCOBALAMIN) tablet 500 mcg (500 mcg Oral Given 01/21/22 1101)  acetaminophen (TYLENOL) tablet 650 mg (has no administration in time range)  oxyCODONE (Oxy IR/ROXICODONE) immediate release tablet 5 mg (5 mg Oral Given 01/21/22 1648)  polyethylene glycol (MIRALAX / GLYCOLAX) packet 17 g (has no administration in time range)  melatonin tablet 5 mg (has no administration in time range)  ondansetron (ZOFRAN) injection 4 mg (has no administration in time range)  guaiFENesin-dextromethorphan (ROBITUSSIN DM) 100-10 MG/5ML syrup 5 mL (has no administration in time range)  furosemide (LASIX) injection 40 mg (40 mg Intravenous  Given 01/21/22 1105)  cefTRIAXone (ROCEPHIN) 1 g in sodium chloride 0.9 % 100 mL IVPB (0 g Intravenous Stopped 01/21/22 0236)  azithromycin (ZITHROMAX) 500 mg in sodium chloride 0.9 % 250 mL IVPB (0 mg Intravenous Stopped 01/21/22 0349)  potassium chloride SA (KLOR-CON M) CR tablet 40 mEq (40 mEq Oral Given 01/21/22 0752)    Mobility walks with device

## 2022-01-21 NOTE — ED Provider Notes (Signed)
[facility] DEPT  Provider Note  CSN: [id number] Arrival date & time: 01/20/22 1844  History Chief Complaint  Patient presents with   abnormal labs    Carla Taylor is a 88 y.o. female brought from home by daughter who does not normally live with her but has been staying with the patient while her brother is out of town for the last couple of weeks. This morning Home Health came by to do an evaluation and noted her SpO2 to be in the 80s at rest. Improved to low 90s with deep breath. She already had an appointment with her PCP at lunch time and SpO2 again noted to be low on arrival there but again improved with rest and she was ultimately allowed to go home after labs were collected. Daughter was called around dinner time and told the patient's Hgb was down to 9, which was apparently a significant drop, although daughter is unsure what baseline is. The daughter reports the patient has had some SOB at times but not consistently so. She had a cough about 2 weeks ago and completed a course of antibiotics and seemed to get better from that. She denies any fever, CP, nausea or vomiting. She reports she may have had some dark stools, but otherwise has been doing well except for a reported lack of energy.    Home Medications Prior to Admission medications   Medication Sig Start Date End Date Taking? Authorizing Provider  amLODipine (NORVASC) 5 MG tablet Take 5 mg by mouth daily.  12/27/15   [provider]  aspirin EC 81 MG tablet Take 81 mg by mouth daily.    [provider]  baclofen (LIORESAL) 10 MG tablet Take 1 tablet (10 mg total) by mouth 3 (three) times daily. As needed for muscle spasm 02/05/16   Marchia Bond, MD  benzonatate (TESSALON) 100 MG capsule Take 1 capsule (100 mg total) by mouth 3 (three) times daily as needed for cough. 11/08/21   Gareth Morgan, MD  calcium carbonate (OS-CAL - DOSED IN MG OF ELEMENTAL CALCIUM) 1250 MG tablet Take  1,250 mg by mouth 2 (two) times daily with a meal.     [provider]  cephALEXin (KEFLEX) 500 MG capsule Take 1 capsule (500 mg total) by mouth 2 (two) times daily. 04/12/21   Gardiner Barefoot, DPM  doxazosin (CARDURA) 2 MG tablet TAKE 1 TABLET BY MOUTH  DAILY  PLEASE OBTAIN FUTURE  REFILLS FROM PCP. 02/19/16   Lelon Perla, MD  hydrochlorothiazide (HYDRODIURIL) 25 MG tablet Take 25 mg by mouth daily.  04/29/14   [provider]  HYDROcodone-acetaminophen (NORCO) 10-325 MG tablet Take 1-2 tablets by mouth every 6 (six) hours as needed. 02/05/16   Marchia Bond, MD  levothyroxine (SYNTHROID, LEVOTHROID) 88 MCG tablet Take 88 mcg by mouth daily before breakfast.    [provider]  lisinopril (PRINIVIL,ZESTRIL) 40 MG tablet TAKE 1 TABLET BY MOUTH  DAILY 02/19/16   Lelon Perla, MD  metoprolol succinate (TOPROL-XL) 50 MG 24 hr tablet Take 1 and 1/2 tablets by mouth daily. <PLEASE MAKE APPOINTMENT FOR REFILLS> 04/02/17   Lelon Perla, MD  Multiple Vitamins-Minerals (ICAPS AREDS FORMULA PO) Take 1 tablet by mouth 2 (two) times daily.    [provider]  ondansetron (ZOFRAN) 4 MG tablet Take 1 tablet (4 mg total) by mouth every 8 (eight) hours as needed for nausea or vomiting. 07/20/16   Mackuen, Fredia Sorrow, MD  rivaroxaban (  XARELTO) 10 MG TABS tablet Take 1 tablet (10 mg total) by mouth daily. 02/05/16   Marchia Bond, MD  sennosides-docusate sodium (SENOKOT-S) 8.6-50 MG tablet Take 2 tablets by mouth daily. Patient taking differently: Take 2 tablets by mouth daily as needed for constipation. 09/19/14   Marchia Bond, MD  sennosides-docusate sodium (SENOKOT-S) 8.6-50 MG tablet Take 2 tablets by mouth daily. 02/05/16   Marchia Bond, MD  vitamin B-12 (CYANOCOBALAMIN) 500 MCG tablet Take 500 mcg by mouth daily.    [provider]  Vitamin D, Ergocalciferol, (DRISDOL) 50000 UNITS CAPS capsule Take 50,000 Units by mouth every Sunday.    [provider]     Allergies    Patient has no active allergies.   Review of Systems   Review of Systems Please see HPI for pertinent positives and negatives  Physical Exam BP (!) 156/60 (BP Location: Right Arm)   Pulse 62   Temp 98.4 F (36.9 C) (Oral)   Resp 18   Ht '5\' 2"'$  (1.575 m)   Wt 86.6 kg   SpO2 100%   BMI 34.93 kg/m   Physical Exam Vitals and nursing note reviewed.  Constitutional:      Appearance: Normal appearance.  HENT:     Head: Normocephalic and atraumatic.     Nose: Nose normal.     Mouth/Throat:     Mouth: Mucous membranes are moist.  Eyes:     Extraocular Movements: Extraocular movements intact.     Conjunctiva/sclera: Conjunctivae normal.  Cardiovascular:     Rate and Rhythm: Normal rate.  Pulmonary:     Effort: Pulmonary effort is normal.     Breath sounds: Normal breath sounds.  Abdominal:     General: Abdomen is flat.     Palpations: Abdomen is soft.     Tenderness: There is no abdominal tenderness.  Genitourinary:    Comments: Chaperone presents, soft brown stool, heme neg Musculoskeletal:        General: No swelling. Normal range of motion.     Cervical back: Neck supple.  Skin:    General: Skin is warm and dry.  Neurological:     General: No focal deficit present.     Mental Status: She is alert.  Psychiatric:        Mood and Affect: Mood normal.     ED Results / Procedures / Treatments   EKG EKG Interpretation  Date/Time:  Monday January 20 2022 20:10:36 EDT Ventricular Rate:  74 PR Interval:    QRS Duration: 101 QT Interval:  386 QTC Calculation: 420 R Axis:   12 Text Interpretation: Interpretation limited secondary to artifact Repol abnrm suggests ischemia, diffuse leads Confirmed by Calvert Cantor [phone]) on 01/21/2022 1:15:39 AM  Procedures Procedures  Medications Ordered in the ED Medications  cefTRIAXone (ROCEPHIN) 1 g in sodium chloride 0.9 % 100 mL IVPB (has no administration in time range)  azithromycin  (ZITHROMAX) 500 mg in sodium chloride 0.9 % 250 mL IVPB (has no administration in time range)    Initial Impression and Plan  Patient here with reported hypoxia, some symptoms of dyspnea and an apparent drop in Hgb on outpatient labs. Results from labs done in triage confirmed Hgb 9.3 down from 11 just a couple of months ago. BMP is unremarkable. BNP only mildly elevated. I personally viewed the images from radiology studies and agree with radiologist interpretation: CXR with bibasilar haziness could be infiltrates particularly in light of recent cough. Will check ambulatory sat and  begin Abx.    ED Course   Clinical Course as of 01/21/22 0159  Tue Jan 21, 2022  0147 Per RN, SpO2 down to 89% when walking. Plan admission for further evaluation.  [CS]  0159 Spoke with Dr. Nevada Crane, Hospitalist, who will evaluate for admission.  [CS]    Clinical Course User Index [CS] Truddie Hidden, MD     MDM Rules/Calculators/A&P Medical Decision Making Problems Addressed: Acute respiratory failure with hypoxia [facility]): acute illness or injury Anemia, unspecified type: acute illness or injury Community acquired pneumonia, unspecified laterality: acute illness or injury  Amount and/or Complexity of Data Reviewed Labs: ordered. Decision-making details documented in ED Course. Radiology: ordered and independent interpretation performed. Decision-making details documented in ED Course. ECG/medicine tests: ordered and independent interpretation performed. Decision-making details documented in ED Course.  Risk Decision regarding hospitalization.    Final Clinical Impression(s) / ED Diagnoses Final diagnoses:  Acute respiratory failure with hypoxia ([place])  Community acquired pneumonia, unspecified laterality  Anemia, unspecified type    Rx / DC Orders ED Discharge Orders     None        Truddie Hidden, MD 01/21/22 0159

## 2022-01-21 NOTE — Evaluation (Signed)
Physical Therapy Evaluation Patient Details Name: Carla Taylor MRN: [id number] DOB: [birth year] Today's Date: 01/21/2022  History of Present Illness  Patient is a 88 year old female who presented from home with hypoxia, nonproductive cough and generalized weakness. Patient was found to have acute hypoxic respiratory failure seconary to community acquired pneumonia, acute blood loss anemia.PMH: essential hypertension, hypothyroidism, hyperlipidemia, mixed incontinence, CKD  Clinical Impression  The patient is very pleasant, lives with family but  home alone daytime, ambulates with RW.  Patient resting on 2 L, SPO2 95%. Patient ambulated today on RA with Spo2   down to 88% with 3/4 dyspnea. Patient should progress to return home. Patient just started HHPT. Pt admitted with above diagnosis.  Pt currently with functional limitations due to the deficits listed below (see PT Problem List). Pt will benefit from skilled PT to increase their independence and safety with mobility to allow discharge to the venue listed below.        Recommendations for follow up therapy are one component of a multi-disciplinary discharge planning process, led by the attending physician.  Recommendations may be updated based on patient status, additional functional criteria and insurance authorization.  Follow Up Recommendations Home health PT      Assistance Recommended at Discharge None  Patient can return home with the following  Assistance with cooking/housework;Assist for transportation;Help with stairs or ramp for entrance;A little help with bathing/dressing/bathroom    Equipment Recommendations None recommended by PT  Recommendations for Other Services       Functional Status Assessment Patient has had a recent decline in their functional status and demonstrates the ability to make significant improvements in function in a reasonable and predictable amount of time.     Precautions / Restrictions  Precautions Precaution Comments: monitor O2, chronic foley/leg bag Restrictions Weight Bearing Restrictions: No      Mobility  Bed Mobility               General bed mobility comments: min A for supine to sit on edge of bed in ED and mod A to get BLE back onto bed in ED    Transfers Overall transfer level: Needs assistance Equipment used: Rolling walker (2 wheels) Transfers: Sit to/from Stand Sit to Stand: Min guard                Ambulation/Gait Ambulation/Gait assistance: Min guard Gait Distance (Feet): 120 Feet Assistive device: Rolling walker (2 wheels) Gait Pattern/deviations: Step-through pattern, Trunk flexed Gait velocity: decr     General Gait Details: gait steady with turns  Science writer    Modified Rankin (Stroke Patients Only)       Balance Overall balance assessment: Mild deficits observed, not formally tested                                           Pertinent Vitals/Pain Pain Assessment Pain Location: R arm especially with BP cuff taking BP Pain Descriptors / Indicators: Discomfort Pain Intervention(s): Monitored during session    Home Living Family/patient expects to be discharged to:: Private residence Living Arrangements: Children Available Help at Discharge: Family;Available PRN/intermittently (family works during the day) Type of Home: House Home Access: [place]: One level [place]: [name] - single point Additional Comments: patients family  renovated the bathroom for handicap accessable recently with walk in shower and higher commode keep 3 in 1 over toilet at home    Prior Function Prior Level of Function : Independent/Modified Independent             Mobility Comments: uses Rw, home daytime, just started HHPT for balance.       Hand Dominance   Dominant Hand: Right    Extremity/Trunk Assessment   Upper Extremity  Assessment Upper Extremity Assessment: Overall WFL for tasks assessed    Lower Extremity Assessment Lower Extremity Assessment: Generalized weakness    Cervical / Trunk Assessment Cervical / Trunk Assessment: Normal;Other exceptions  Communication   Communication: HOH  Cognition Arousal/Alertness: Awake/alert Behavior During Therapy: WFL for tasks assessed/performed Overall Cognitive Status: Within Functional Limits for tasks assessed                                 General Comments: daughter present in room able to help with PLOF questions        General Comments      Exercises     Assessment/Plan    PT Assessment Patient needs continued PT services  PT Problem List Decreased mobility;Decreased strength;Decreased safety awareness;Decreased balance;Decreased knowledge of use of DME;Decreased activity tolerance;Cardiopulmonary status limiting activity       PT Treatment Interventions DME instruction;Therapeutic activities;Gait training;Therapeutic exercise;Patient/family education;Functional mobility training    PT Goals (Current goals can be found in the Care Plan section)  Acute Rehab PT Goals Patient Stated Goal: to go home tomorrow PT Goal Formulation: With patient/family Time For Goal Achievement: 02/04/22 Potential to Achieve Goals: Good    Frequency Min 3X/week     Co-evaluation PT/OT/SLP Co-Evaluation/Treatment: Yes Reason for Co-Treatment: For patient/therapist safety PT goals addressed during session: Mobility/safety with mobility OT goals addressed during session: ADL's and self-care       AM-PAC PT "6 Clicks" Mobility  Outcome Measure Help needed turning from your back to your side while in a flat bed without using bedrails?: A Little Help needed moving from lying on your back to sitting on the side of a flat bed without using bedrails?: A Little Help needed moving to and from a bed to a chair (including a wheelchair)?: A  Little Help needed standing up from a chair using your arms (e.g., wheelchair or bedside chair)?: A Little Help needed to walk in hospital room?: A Little Help needed climbing 3-5 steps with a railing? : A Lot 6 Click Score: 17    End of Session Equipment Utilized During Treatment: Gait belt Activity Tolerance: Patient tolerated treatment well Patient left: in bed;with call bell/phone within reach;with family/visitor present Nurse Communication: Mobility status PT Visit Diagnosis: Unsteadiness on feet (R26.81)    Time: 7564-3329 PT Time Calculation (min) (ACUTE ONLY): 21 min   Charges:   PT Evaluation $PT Eval Low Complexity: 1 Low          [place] Office [phone] Weekend [phone]   Claretha Cooper 01/21/2022, 10:24 AM

## 2022-01-21 NOTE — ED Notes (Signed)
Pt resting on hall bed with family member at bedside, NAD noted, even RR and unlabored, pt within view of staff, side rails up x2 for safety, pt voiced no concerns or questions at this time, care on going, will continue to monitor.

## 2022-01-21 NOTE — Evaluation (Signed)
Occupational Therapy Evaluation Patient Details Name: Carla Taylor MRN: [id number] DOB: 08/16/1924 Today's Date: 01/21/2022   History of Present Illness Patient is a 88 year old female who presented from home with hypoxia, nonproductive cough and generalized weakness. patient was found to have acute hypoxic respiratory failure seconary to community acquired pneumonia, acute blood loss anemia.PMH: essential hypertension, hypothyroidism, hyperlipidemia, mixed incontinence, CKD 3B   Clinical Impression   Patient is a 88 year old female who was admitted for above.patient was living at home with children who work during the day. Patient was noted to have decreased functional activity tolerance, and increased need for supplemental O2 at this time. Patients blood pressure was 174/82 mmhg with 96% on 2L/min at rest and HR of 60 bpm at start of session. Patient was noted drop O2 to 88% on RA with functional activity with RW. Patient would continue to benefit from skilled OT services at this time while admitted and after d/c to address noted deficits in order to improve overall safety and independence in ADLs.       Recommendations for follow up therapy are one component of a multi-disciplinary discharge planning process, led by the attending physician.  Recommendations may be updated based on patient status, additional functional criteria and insurance authorization.   Follow Up Recommendations  Home health OT    Assistance Recommended at Discharge Frequent or constant Supervision/Assistance  Patient can return home with the following A little help with walking and/or transfers;A little help with bathing/dressing/bathroom;Help with stairs or ramp for entrance;Assistance with cooking/housework;Direct supervision/assist for financial management;Direct supervision/assist for medications management    Functional Status Assessment  Patient has had a recent decline in their functional status and demonstrates  the ability to make significant improvements in function in a reasonable and predictable amount of time.  Equipment Recommendations       Recommendations for Other Services       Precautions / Restrictions Precautions Precaution Comments: monitor O2 Restrictions Weight Bearing Restrictions: No      Mobility Bed Mobility Overal bed mobility: Needs Assistance Bed Mobility: Supine to Sit, Sit to Supine     Supine to sit: Min assist Sit to supine: Mod assist   General bed mobility comments: min A for supine to sit on edge of bed in ED and mod A to get BLE back onto bed in ED    Transfers                          Balance Overall balance assessment: Mild deficits observed, not formally tested                                         ADL either performed or assessed with clinical judgement   ADL Overall ADL's : Needs assistance/impaired Eating/Feeding: Set up;Sitting   Grooming: Set up;Sitting   Upper Body Bathing: Set up;Sitting   Lower Body Bathing: Minimal assistance;Sitting/lateral leans   Upper Body Dressing : Set up;Sitting   Lower Body Dressing: Minimal assistance;Sit to/from stand   Toilet Transfer: Minimal assistance Toilet Transfer Details (indicate cue type and reason): with patients personal walker from bed in ED to hallway. patient reported not needing to use bathroom at this time with foley leg bag in palce per daughter report. Toileting- Clothing Manipulation and Hygiene: Minimal assistance;Sit to/from stand       Functional  mobility during ADLs: Min guard;Rolling walker (2 wheels)       Vision Baseline Vision/History: 1 Wears glasses Patient Visual Report: No change from baseline       Perception     Praxis      Pertinent Vitals/Pain Pain Assessment Pain Assessment: 0-10 Pain Score: 5  Pain Location: R arm especially with BP cuff taking BP Pain Intervention(s): Limited activity within patient's tolerance,  Monitored during session     Hand Dominance Right   Extremity/Trunk Assessment Upper Extremity Assessment Upper Extremity Assessment: Overall WFL for tasks assessed   Lower Extremity Assessment Lower Extremity Assessment: Defer to PT evaluation   Cervical / Trunk Assessment Cervical / Trunk Assessment: Normal   Communication Communication Communication: HOH (bilateral hearing aids)   Cognition Arousal/Alertness: Awake/alert Behavior During Therapy: WFL for tasks assessed/performed Overall Cognitive Status: Difficult to assess                                 General Comments: daughter present in room able to help with PLOF questions     General Comments       Exercises     Shoulder Instructions      Home Living Family/patient expects to be discharged to:: Private residence Living Arrangements: Children Available Help at Discharge: Family;Available PRN/intermittently (family works during the day) Type of Home: House Home Access: [place]: One level               [place]: [name] - single point   Additional Comments: patients family renovated the bathroom for handicap accessable recently with walk in shower and higher commode keep 3 in 1 over toilet at home      Prior Functioning/Environment Prior Level of Function : Independent/Modified Independent                        OT Problem List: Decreased activity tolerance;Impaired balance (sitting and/or standing);Decreased knowledge of precautions;Decreased safety awareness;Cardiopulmonary status limiting activity      OT Treatment/Interventions: Self-care/ADL training;Therapeutic exercise;Neuromuscular education;Energy conservation;DME and/or AE instruction;Balance training;Patient/family education    OT Goals(Current goals can be found in the care plan section) Acute Rehab OT Goals Patient Stated Goal: to go home soon OT Goal Formulation: With  patient/family Time For Goal Achievement: 02/04/22 Potential to Achieve Goals: Fair  OT Frequency: Min 2X/week    Co-evaluation              AM-PAC OT "6 Clicks" Daily Activity     Outcome Measure Help from another person eating meals?: A Little Help from another person taking care of personal grooming?: A Little Help from another person toileting, which includes using toliet, bedpan, or urinal?: A Lot Help from another person bathing (including washing, rinsing, drying)?: A Lot Help from another person to put on and taking off regular upper body clothing?: A Little Help from another person to put on and taking off regular lower body clothing?: A Lot 6 Click Score: 15   End of Session Equipment Utilized During Treatment: Gait belt;Rolling walker (2 wheels) Nurse Communication: Other (comment) (ok to participate in session)  Activity Tolerance: Patient tolerated treatment well Patient left: in bed;with call bell/phone within reach;with family/visitor present  OT Visit Diagnosis: Unsteadiness on feet (R26.81);Muscle weakness (generalized) (M62.81)                Time: 0272-5366 OT  Time Calculation (min): 23 min Charges:  OT General Charges $OT Visit: 1 Visit OT Evaluation $OT Eval Moderate Complexity: 1 Mod  Jackelyn Poling OTR/L, MS Acute Rehabilitation Department Office# [phone] Pager# [phone]   Marcellina Millin 01/21/2022, 9:10 AM

## 2022-01-22 ENCOUNTER — Inpatient Hospital Stay (HOSPITAL_COMMUNITY): Payer: Medicare Other

## 2022-01-22 DIAGNOSIS — J9601 Acute respiratory failure with hypoxia: Secondary | ICD-10-CM | POA: Diagnosis not present

## 2022-01-22 DIAGNOSIS — I5031 Acute diastolic (congestive) heart failure: Secondary | ICD-10-CM

## 2022-01-22 LAB — BASIC METABOLIC PANEL
Anion gap: 5 (ref 5–15)
BUN: 26 mg/dL — ABNORMAL HIGH (ref 8–23)
CO2: 29 mmol/L (ref 22–32)
Calcium: 9.1 mg/dL (ref 8.9–10.3)
Chloride: 107 mmol/L (ref 98–111)
Creatinine, Ser: 0.94 mg/dL (ref 0.44–1.00)
GFR, Estimated: 56 mL/min — ABNORMAL LOW (ref >60)
Glucose, Bld: 100 mg/dL — ABNORMAL HIGH (ref 70–99)
Potassium: 3.5 mmol/L (ref 3.5–5.1)
Sodium: 141 mmol/L (ref 135–145)

## 2022-01-22 LAB — MAGNESIUM: Magnesium: 2 mg/dL (ref 1.7–2.4)

## 2022-01-22 LAB — CBC
HCT: 29 % — ABNORMAL LOW (ref 36.0–46.0)
Hemoglobin: 9.1 g/dL — ABNORMAL LOW (ref 12.0–15.0)
MCH: 30.5 pg (ref 26.0–34.0)
MCHC: 31.4 g/dL (ref 30.0–36.0)
MCV: 97.3 fL (ref 80.0–100.0)
Platelets: 185 10*3/uL (ref 150–400)
RBC: 2.98 MIL/uL — ABNORMAL LOW (ref 3.87–5.11)
RDW: 13 % (ref 11.5–15.5)
WBC: 8.9 10*3/uL (ref 4.0–10.5)
nRBC: 0 % (ref 0.0–0.2)

## 2022-01-22 LAB — IRON AND TIBC
Iron: 33 ug/dL (ref 28–170)
Saturation Ratios: 11 % (ref 10.4–31.8)
TIBC: 287 ug/dL (ref 250–450)
UIBC: 254 ug/dL

## 2022-01-22 LAB — ECHOCARDIOGRAM COMPLETE
Area-P 1/2: 4.83 cm2
Height: 62 in
MV M vel: 5.54 m/s
MV Peak grad: 122.8 mmHg
S' Lateral: 2.8 cm
Weight: 3056 oz

## 2022-01-22 LAB — FERRITIN: Ferritin: 88 ng/mL (ref 11–307)

## 2022-01-22 MED ORDER — ENOXAPARIN SODIUM 40 MG/0.4ML IJ SOSY
40.0000 mg | PREFILLED_SYRINGE | INTRAMUSCULAR | Status: DC
  Administered 2022-01-23: 40 mg via SUBCUTANEOUS
  Filled 2022-01-22: qty 0.4

## 2022-01-22 MED ORDER — AZITHROMYCIN 250 MG PO TABS
500.0000 mg | ORAL_TABLET | Freq: Every day | ORAL | Status: DC
  Administered 2022-01-23: 500 mg via ORAL
  Filled 2022-01-22: qty 2

## 2022-01-22 MED ORDER — LORATADINE 10 MG PO TABS
10.0000 mg | ORAL_TABLET | Freq: Every day | ORAL | Status: DC | PRN
  Administered 2022-01-22: 10 mg via ORAL
  Filled 2022-01-22: qty 1

## 2022-01-22 MED ORDER — POTASSIUM CHLORIDE CRYS ER 20 MEQ PO TBCR
40.0000 meq | EXTENDED_RELEASE_TABLET | Freq: Once | ORAL | Status: AC
Start: 2022-01-22 — End: 2022-01-22
  Administered 2022-01-22: 40 meq via ORAL
  Filled 2022-01-22: qty 2

## 2022-01-22 MED ORDER — AMLODIPINE BESYLATE 5 MG PO TABS: 5.0000 mg | ORAL_TABLET | Freq: Every day | ORAL | Status: DC

## 2022-01-22 MED ORDER — LISINOPRIL 20 MG PO TABS
40.0000 mg | ORAL_TABLET | Freq: Every day | ORAL | Status: DC
  Administered 2022-01-22 – 2022-01-23 (×2): 40 mg via ORAL
  Filled 2022-01-22 (×2): qty 2

## 2022-01-22 MED ORDER — IPRATROPIUM-ALBUTEROL 0.5-2.5 (3) MG/3ML IN SOLN
3.0000 mL | Freq: Four times a day (QID) | RESPIRATORY_TRACT | Status: DC | PRN
  Administered 2022-01-22: 3 mL via RESPIRATORY_TRACT
  Filled 2022-01-22: qty 3

## 2022-01-22 NOTE — Progress Notes (Addendum)
Carla Taylor, Carla DOA: Taylor/09/2021 PCP: Antony Contras, Carla    Brief Narrative:   PAIRLEE Taylor is a 88 y.o. female with past medical history significant for essential Taylor, Carla Taylor, Carla Taylor, Carla Taylor/3 from home with shortness of breath, progressive lower extremity edema.  Patient was noted by home health aide that her oxygen saturation was in the 80s at rest.  Was seen by her PCP same day and was noted again to have a low SPO2 and was sent to the ED for further evaluation.  Patient recently completed antibiotic course for pneumonia roughly 2 weeks ago.  In the ED, temperature 98.4 F, HR 64, RR 17, BP 144/72, SPO2 94% on 2 L nasal cannula.  WBC 9.1, hemoglobin 9.3, platelets 201.  Sodium 141, potassium 3.5, chloride 106, CO2 27, glucose 110, BUN 26, creatinine 1.26.  BNP 247.8.  FOBT negative.  Chest x-ray with left greater than right bibasilar airspace disease.  TRH was consulted for further evaluation management of acute hypoxic respiratory failure secondary to pneumonia versus diastolic CHF exacerbation.  Assessment & Plan:   Acute hypoxic respiratory failure Etiology likely secondary to pneumonia versus diastolic CHF exacerbation.  Was notably hypoxic at home and at PCP office.  Not oxygen dependent at baseline. --Continue supplemental oxygen, maintain SPO2 greater than 92% --Ambulatory O2 screen today --Continue treatment as below  Pneumonia Chest x-ray with left greater than right bibasilar airspace disease. --Azithromycin '500mg'$  PO daily, plan 5 day course --Ceftriaxone 1g IV q24h, plan day course  Acute on chronic diastolic congestive heart failure Patient presenting with progressive lower extremity edema.  BNP elevated on admission.  TTE with LVEF 55-60%, no LV regional wall motion abnormalities, LA mildly dilated, moderate MR, no aortic stenosis, IVC normal in size. --Net negative 3  L during hospitalization --Lasix 40 mg IV q24h --Strict I's and O's and daily weights --BMP daily  Hypokalemia Repleted during hospitalization --Repeat electrolytes in a.m. to include magnesium  Normocytic anemia Hemoglobin 9.1, stable.  Anemia panel with iron 33, TIBC 287, ferritin 88.  FOBT negative.  Essential Taylor Home regimen includes amlodipine 5 mg p.o. daily, HCTZ 25 mg p.o. daily, lisinopril 40 mg p.o. daily, Cardura 2 mg p.o. daily, metoprolol succinate 75 mg p.o. daily. --Cardura 2 mg p.o. daily --Metoprolol succinate 75 mg p.o. daily --Amlodipine 10 mg p.o. daily --Furosemide 40 mg IV q24h --Restart lisinopril 40 mg p.o. daily today --Holding home HCTZ/amlodipine  Carla Taylor Currently not on statin outpatient.  Carla Taylor --Levothyroxine 88 mcg p.o. daily  Carla stage IIIb --Cr 1.26>1.08>0.94; stable  Chronic urinary retention --Continue Foley catheter --Outpatient follow-up with urology, Dr. Claudia Desanctis  Weakness/deconditioning: Evaluated by PT/OT with recommendations of home health. --Continue therapy efforts while inpatient   DVT prophylaxis: enoxaparin (LOVENOX) injection 40 mg Start: 01/23/22 1000    Code Status: DNR Family Communication:   Disposition Plan:  Level of care: Telemetry Status is: Inpatient Remains inpatient appropriate because: Continues on IV furosemide, anticipate discharge home with home health likely tomorrow    Consultants:  None  Procedures:  TTE  Antimicrobials:  Azithromycin Taylor/3>> Ceftriaxone Taylor/3>>   Subjective: Patient seen examined bedside, resting comfortably.  Family present.  Patient reports her lower extremity edema is much improved as well as her breathing.  Discussed will attempt to titrate off of supplemental oxygen this morning.  No other questions or concerns at this time.  Denies headache, no  visual changes, no chest pain, no palpitations, no current shortness of breath, no abdominal pain, no  fever/chills/night sweats, no nausea/vomiting/diarrhea, no focal weakness, no fatigue, no paresthesias.  No acute events overnight per nursing staff.  Objective: Vitals:   01/22/22 0102 01/22/22 0604 01/22/22 1002 01/22/22 1330  BP: (!) 158/59 (!) 158/63 (!) 143/62 (!) 170/71  Pulse: 61 (!) 52 65 67  Resp: '15 17 19 19  '$ Temp: 98.1 F (36.Taylor C) 98.4 F (36.9 C) 97.8 F (36.6 C) 98.3 F (36.8 C)  TempSrc: Oral Oral Oral Oral  SpO2: 99% 94% 97% 97%  Weight:      Height:        Intake/Output Summary (Last 24 hours) at Taylor/11/2021 1434 Last data filed at Taylor/11/2021 1334 Gross per 24 hour  Intake 590 ml  Output 2675 ml  Net -2085 ml   Filed Weights   01/20/22 1848  Weight: 86.6 kg    Examination:  Physical Exam: GEN: NAD, alert and oriented x 3, elderly in appearance HEENT: NCAT, PERRL, EOMI, sclera clear, MMM PULM: Breath sounds slightly diminished bilateral bases with mild crackles, no wheezing, normal respiratory effort without accessory muscle use, on 2 L nasal cannula with SPO2 96% at rest CV: RRR w/o M/G/R GI: abd soft, NTND, NABS, no R/G/M MSK: 1+ peripheral edema bilateral lower extremities to mid shin, muscle strength globally intact 5/5 bilateral upper/lower extremities NEURO: CN II-XII intact, no focal deficits, sensation to light touch intact PSYCH: normal mood/affect Integumentary: dry/intact, no rashes or wounds    Data Reviewed: I have personally reviewed following labs and imaging studies  CBC: Recent Labs  Lab 01/20/22 2124 01/21/22 0530 01/22/22 0539  WBC 9.1 8.6 8.9  NEUTROABS  --  5.5  --   HGB 9.3* 8.9* 9.1*  HCT 29.5* 28.2* 29.0*  MCV 97.0 96.2 97.3  PLT 201 194 992   Basic Metabolic Panel: Recent Labs  Lab 01/20/22 2016 01/21/22 0530 01/22/22 0539  NA 141 143 141  K 3.5 3.2* 3.5  CL 106 107 107  CO2 '27 28 29  '$ GLUCOSE 110* 94 100*  BUN 26* 28* 26*  CREATININE 1.26* 1.08* 0.94  CALCIUM 9.5 8.8* 9.1  MG  --  1.9 2.0  PHOS  --  3.5  --     GFR: Estimated Creatinine Clearance: 37.4 mL/min (by C-G formula based on SCr of 0.94 mg/dL). Liver Function Tests: Recent Labs  Lab 01/21/22 0530  AST 14*  ALT 11  ALKPHOS 67  BILITOT 0.5  PROT 6.4*  ALBUMIN 3.1*   No results for input(s): "LIPASE", "AMYLASE" in the last 168 hours. No results for input(s): "AMMONIA" in the last 168 hours. Coagulation Profile: No results for input(s): "INR", "PROTIME" in the last 168 hours. Cardiac Enzymes: No results for input(s): "CKTOTAL", "CKMB", "CKMBINDEX", "TROPONINI" in the last 168 hours. BNP (last 3 results) No results for input(s): "PROBNP" in the last 8760 hours. HbA1C: No results for input(s): "HGBA1C" in the last 72 hours. CBG: No results for input(s): "GLUCAP" in the last 168 hours. Lipid Profile: No results for input(s): "CHOL", "HDL", "LDLCALC", "TRIG", "CHOLHDL", "LDLDIRECT" in the last 72 hours. Thyroid Function Tests: No results for input(s): "TSH", "T4TOTAL", "FREET4", "T3FREE", "THYROIDAB" in the last 72 hours. Anemia Panel: Recent Labs    01/22/22 0539  FERRITIN 88  TIBC 287  IRON 33   Sepsis Labs: Recent Labs  Lab 01/21/22 0530  PROCALCITON <0.10    No results found for this or any  previous visit (from the past 240 hour(s)).       Radiology Studies: ECHOCARDIOGRAM COMPLETE  Result Date: Taylor/11/2021    ECHOCARDIOGRAM REPORT   Patient Name:   SONYA GUNNOE Alleyne Date of Exam: Taylor/11/2021 Medical Rec #:  [id number]    Height:       62.0 in Accession #:    [id number]   Weight:       191.0 lb Date of Birth:  [birth year]    BSA:          1.875 m Patient Age:    66 years     BP:           143/62 mmHg Patient Gender: F            HR:           65 bpm. Exam Location:  Inpatient Procedure: 2D Echo, Cardiac Doppler and Color Doppler Indications:    CHF-Acute Diastolic R42.70  History:        Patient has prior history of Echocardiogram examinations, most                 recent 06/06/2014. Risk Factors:Taylor and  Dyslipidemia.                 Chronic kidney disease. Carla Taylor.  Referring Phys: [place]  1. Left ventricular ejection fraction, by estimation, is 55 to 60%. The left ventricle has normal function. The left ventricle has no regional wall motion abnormalities. There is mild concentric left ventricular hypertrophy. Elevated left atrial pressure.  2. Right ventricular systolic function is normal. The right ventricular size is normal. Tricuspid regurgitation signal is inadequate for assessing PA pressure.  3. Left atrial size was mildly dilated.  4. The mitral valve is normal in structure. Moderate mitral valve regurgitation. No evidence of mitral stenosis. Moderate mitral annular calcification.  5. The aortic valve has an indeterminant number of cusps. Aortic valve regurgitation is trivial. Aortic valve sclerosis is present, with no evidence of aortic valve stenosis.  6. The inferior vena cava is normal in size with greater than 50% respiratory variability, suggesting right atrial pressure of 3 mmHg. FINDINGS  Left Ventricle: Left ventricular ejection fraction, by estimation, is 55 to 60%. The left ventricle has normal function. The left ventricle has no regional wall motion abnormalities. The left ventricular internal cavity size was normal in size. There is  mild concentric left ventricular hypertrophy. Left ventricular diastolic function could not be evaluated due to mitral annular calcification (moderate or greater). Elevated left atrial pressure. Right Ventricle: The right ventricular size is normal. No increase in right ventricular wall thickness. Right ventricular systolic function is normal. Tricuspid regurgitation signal is inadequate for assessing PA pressure. Left Atrium: Left atrial size was mildly dilated. Right Atrium: Right atrial size was normal in size. Pericardium: There is no evidence of pericardial effusion. Presence of epicardial fat layer. Mitral Valve: The mitral  valve is normal in structure. Moderate mitral annular calcification. Moderate mitral valve regurgitation. No evidence of mitral valve stenosis. Tricuspid Valve: The tricuspid valve is normal in structure. Tricuspid valve regurgitation is trivial. No evidence of tricuspid stenosis. Aortic Valve: The aortic valve has an indeterminant number of cusps. Aortic valve regurgitation is trivial. Aortic valve sclerosis is present, with no evidence of aortic valve stenosis. Pulmonic Valve: The pulmonic valve was not well visualized. Pulmonic valve regurgitation is trivial. No evidence of pulmonic stenosis. Aorta: The aortic root is normal in size and structure.  Venous: The inferior vena cava is normal in size with greater than 50% respiratory variability, suggesting right atrial pressure of 3 mmHg. IAS/Shunts: No atrial level shunt detected by color flow Doppler.  LEFT VENTRICLE PLAX 2D LVIDd:         4.30 cm   Diastology LVIDs:         2.80 cm   LV e' medial:    4.05 cm/s LV PW:         1.20 cm   LV E/e' medial:  38.3 LV IVS:        1.20 cm   LV e' lateral:   8.55 cm/s LVOT diam:     1.80 cm   LV E/e' lateral: 18.1 LV SV:         61 LV SV Index:   33 LVOT Area:     2.54 cm  RIGHT VENTRICLE RV S prime:     13.20 cm/s TAPSE (M-mode): 2.0 cm LEFT ATRIUM             Index        RIGHT ATRIUM          Index LA diam:        3.70 cm 1.97 cm/m   RA Area:     9.95 cm LA Vol (A2C):   75.4 ml 40.22 ml/m  RA Volume:   17.30 ml 9.23 ml/m LA Vol (A4C):   68.3 ml 36.43 ml/m LA Biplane Vol: 73.4 ml 39.15 ml/m  AORTIC VALVE LVOT Vmax:   114.00 cm/s LVOT Vmean:  72.200 cm/s LVOT VTI:    0.240 m  AORTA Ao Root diam: 3.00 cm MITRAL VALVE MV Area (PHT): 4.83 cm     SHUNTS MV Decel Time: 157 msec     Systemic VTI:  0.24 m MR Peak grad: 122.8 mmHg    Systemic Diam: 1.80 cm MR Mean grad: 88.0 mmHg MR Vmax:      554.00 cm/s MR Vmean:     450.0 cm/s MV E velocity: 155.00 cm/s MV A velocity: 116.00 cm/s MV E/A ratio:  1.34 Kardie Tobb DO  Electronically signed by Berniece Salines DO Signature Date/Time: Taylor/11/2021/2:05:13 PM    Final    DG Chest 2 View  Result Date: Taylor/09/2021 CLINICAL DATA:  Shortness of breath EXAM: CHEST - 2 VIEW COMPARISON:  11/08/2021 FINDINGS: Right shoulder replacement. Hazy ill-defined left greater than right basilar airspace disease. Possible tiny effusions. Stable cardiomediastinal silhouette with aortic atherosclerosis. No pneumothorax IMPRESSION: 1. Ill-defined hazy left greater than right basilar airspace disease which may be due to atelectasis or pneumonia 2. Possible tiny pleural effusions Electronically Signed   By: Donavan Foil M.D.   On: 01/20/2022 20:19        Scheduled Meds:  [START ON Taylor/12/2021] azithromycin  500 mg Oral Daily   Chlorhexidine Gluconate Cloth  6 each Topical Daily   doxazosin  2 mg Oral Daily   [START ON Taylor/12/2021] enoxaparin (LOVENOX) injection  40 mg Subcutaneous Q24H   furosemide  40 mg Intravenous Daily   levothyroxine  88 mcg Oral Q0600   metoprolol succinate  75 mg Oral Daily   vitamin B-12  500 mcg Oral Daily   Continuous Infusions:  cefTRIAXone (ROCEPHIN)  IV Stopped (01/22/22 0303)     LOS: 1 day    Time spent: 45 minutes spent on chart review, discussion with nursing staff, consultants, updating family and interview/physical exam; more than 50% of that time was spent in counseling and/or  coordination of care.    [place] Guitron J [place] DO [facility] Available via Epic secure chat 7am-7pm After these hours, please refer to coverage provider listed on [email or web address] Taylor/11/2021, 2:34 PM

## 2022-01-22 NOTE — Progress Notes (Signed)
  Echocardiogram 2D Echocardiogram has been performed.  Darlina Sicilian M 01/22/2022, 11:59 AM

## 2022-01-22 NOTE — Progress Notes (Signed)
Physical Therapy Treatment Patient Details Name: Carla Taylor MRN: [id number] DOB: 10/14/1924 Today's Date: 01/22/2022   History of Present Illness Patient is a 88 year old female who presented from home with hypoxia, nonproductive cough and generalized weakness. Patient was found to have acute hypoxic respiratory failure seconary to community acquired pneumonia, acute blood loss anemia.PMH: essential hypertension, hypothyroidism, hyperlipidemia, mixed incontinence, CKD    PT Comments    Pt seen for O2 ambulation test, please see previous notes for results.  Pt supine in bed and agreeable to be seen, reporting no pain other than soreness in bilateral legs. Pt required min-mod assist for bed mobility, min assist for transfers, and min guard for ambulation in hallway ~90f with RW and +2 for recliner follow for safety, Pt on RA for duration SpO2 90-96%. Encouraged pt to pace activities and allow for rest breaks to recover breath, pt verbalized understanding. Recommended use of RW during recovery period. Discharge destination appropriate. We will continue to follow acutely.    Recommendations for follow up therapy are one component of a multi-disciplinary discharge planning process, led by the attending physician.  Recommendations may be updated based on patient status, additional functional criteria and insurance authorization.  Follow Up Recommendations  Home health PT     Assistance Recommended at Discharge None  Patient can return home with the following Assistance with cooking/housework;Assist for transportation;Help with stairs or ramp for entrance;A little help with bathing/dressing/bathroom;A little help with walking and/or transfers   Equipment Recommendations  None recommended by PT    Recommendations for Other Services       Precautions / Restrictions Precautions Precaution Comments: monitor O2, chronic foley/leg bag Restrictions Weight Bearing Restrictions: No      Mobility  Bed Mobility Overal bed mobility: Needs Assistance Bed Mobility: Supine to Sit, Sit to Supine     Supine to sit: Min assist Sit to supine: Mod assist   General bed mobility comments: min A for supine to sit on edge of bed (Trunk elevation and scooting hips EOB) and mod A to get BLE back onto bed.    Transfers Overall transfer level: Needs assistance Equipment used: Rolling walker (2 wheels) Transfers: Sit to/from Stand Sit to Stand: Min assist, From elevated surface           General transfer comment: Min assist for steadying of RW, otherwise min guard.    Ambulation/Gait Ambulation/Gait assistance: Min guard, +2 safety/equipment Gait Distance (Feet): 80 Feet Assistive device: Rolling walker (2 wheels) Gait Pattern/deviations: Step-through pattern, Trunk flexed Gait velocity: decr     General Gait Details: Pt ambulated for O2 sat test 878fwith RW and min guard, no physical assist required or overt LOB noted, +2 for safety/equipment.   Stairs             Wheelchair Mobility    Modified Rankin (Stroke Patients Only)       Balance Overall balance assessment: Mild deficits observed, not formally tested                                          Cognition Arousal/Alertness: Awake/alert Behavior During Therapy: WFL for tasks assessed/performed Overall Cognitive Status: Within Functional Limits for tasks assessed  General Comments: daughter present in room able to help with PLOF questions        Exercises      General Comments        Pertinent Vitals/Pain Pain Assessment Pain Assessment: No/denies pain    Home Living                          Prior Function            PT Goals (current goals can now be found in the care plan section) Acute Rehab PT Goals Patient Stated Goal: to go home tomorrow PT Goal Formulation: With patient/family Time For Goal  Achievement: 02/04/22 Potential to Achieve Goals: Good Progress towards PT goals: Progressing toward goals    Frequency    Min 3X/week      PT Plan Current plan remains appropriate    Co-evaluation              AM-PAC PT "6 Clicks" Mobility   Outcome Measure  Help needed turning from your back to your side while in a flat bed without using bedrails?: A Little Help needed moving from lying on your back to sitting on the side of a flat bed without using bedrails?: A Little Help needed moving to and from a bed to a chair (including a wheelchair)?: A Little Help needed standing up from a chair using your arms (e.g., wheelchair or bedside chair)?: A Little Help needed to walk in hospital room?: A Little Help needed climbing 3-5 steps with a railing? : A Lot 6 Click Score: 17    End of Session Equipment Utilized During Treatment: Gait belt Activity Tolerance: Patient tolerated treatment well;No increased pain Patient left: in bed;with call bell/phone within reach;with family/visitor present Nurse Communication: Mobility status PT Visit Diagnosis: Unsteadiness on feet (R26.81)     Time: 0100-7121 PT Time Calculation (min) (ACUTE ONLY): 20 min  Charges:  $Gait Training: 8-22 mins                     Coolidge Breeze, PT, DPT [facility] Office: [phone] Pager: [phone]   Coolidge Breeze 01/22/2022, 1:01 PM

## 2022-01-22 NOTE — Progress Notes (Signed)
PHARMACIST - PHYSICIAN COMMUNICATION DR:   Beverely Risen CONCERNING: Antibiotic IV to Oral Route Change Policy  RECOMMENDATION: This patient is receiving azithromycin by the intravenous route.  Based on criteria approved by the Pharmacy and Therapeutics Committee, the antibiotic(s) is/are being converted to the equivalent oral dose form(s).   DESCRIPTION: These criteria include: Patient being treated for a respiratory tract infection, urinary tract infection, cellulitis or clostridium difficile associated diarrhea if on metronidazole The patient is not neutropenic and does not exhibit a GI malabsorption state The patient is eating (either orally or via tube) and/or has been taking other orally administered medications for a least 24 hours The patient is improving clinically and has a Tmax < 100.5  If you have questions about this conversion, please contact the Pharmacy Department  '[]'$   [phone] )  [facility] '[]'$   [phone] )  [facility]  '[]'$   [phone] )  [facility] '[x]'$   [phone] )  [place], PharmD 01/22/2022 1:07 PM

## 2022-01-22 NOTE — Progress Notes (Signed)
SATURATION QUALIFICATIONS: (This note is used to comply with regulatory documentation for home oxygen)  Patient Saturations on Room Air at Rest = 96%  Patient Saturations on Room Air while Ambulating = 90%  Patient Saturations on 2 Liters of oxygen while Ambulating = 97%  Please briefly explain why patient needs home oxygen: Pt did not drop below 88% on RA while ambulating, maintained at 90%.   Coolidge Breeze, PT, DPT [facility] Office: [phone] Pager: [phone]

## 2022-01-23 DIAGNOSIS — J9601 Acute respiratory failure with hypoxia: Secondary | ICD-10-CM | POA: Diagnosis not present

## 2022-01-23 LAB — MAGNESIUM: Magnesium: 2 mg/dL (ref 1.7–2.4)

## 2022-01-23 LAB — BASIC METABOLIC PANEL
Anion gap: 8 (ref 5–15)
BUN: 23 mg/dL (ref 8–23)
CO2: 29 mmol/L (ref 22–32)
Calcium: 8.6 mg/dL — ABNORMAL LOW (ref 8.9–10.3)
Chloride: 100 mmol/L (ref 98–111)
Creatinine, Ser: 1.07 mg/dL — ABNORMAL HIGH (ref 0.44–1.00)
GFR, Estimated: 48 mL/min — ABNORMAL LOW (ref >60)
Glucose, Bld: 115 mg/dL — ABNORMAL HIGH (ref 70–99)
Potassium: 3.7 mmol/L (ref 3.5–5.1)
Sodium: 137 mmol/L (ref 135–145)

## 2022-01-23 MED ORDER — AZITHROMYCIN 500 MG PO TABS: 500.0000 mg | ORAL_TABLET | Freq: Every day | ORAL | 0 refills | Status: AC

## 2022-01-23 MED ORDER — POTASSIUM CHLORIDE CRYS ER 20 MEQ PO TBCR
20.0000 meq | EXTENDED_RELEASE_TABLET | Freq: Once | ORAL | Status: AC
  Administered 2022-01-23: 20 meq via ORAL
  Filled 2022-01-23: qty 1

## 2022-01-23 MED ORDER — POTASSIUM CHLORIDE CRYS ER 20 MEQ PO TBCR: 20.0000 meq | EXTENDED_RELEASE_TABLET | Freq: Every day | ORAL | 2 refills | Status: DC

## 2022-01-23 MED ORDER — FLUTICASONE PROPIONATE 50 MCG/ACT NA SUSP
2.0000 | Freq: Every day | NASAL | Status: DC
  Filled 2022-01-23: qty 16

## 2022-01-23 MED ORDER — CEFDINIR 300 MG PO CAPS: 300.0000 mg | ORAL_CAPSULE | Freq: Two times a day (BID) | ORAL | 0 refills | Status: AC

## 2022-01-23 MED ORDER — IPRATROPIUM-ALBUTEROL 0.5-2.5 (3) MG/3ML IN SOLN
3.0000 mL | Freq: Four times a day (QID) | RESPIRATORY_TRACT | Status: DC | PRN
  Administered 2022-01-23: 3 mL via RESPIRATORY_TRACT
  Filled 2022-01-23: qty 3

## 2022-01-23 MED ORDER — FUROSEMIDE 40 MG PO TABS: 40.0000 mg | ORAL_TABLET | Freq: Every day | ORAL | 2 refills | Status: DC

## 2022-01-23 NOTE — TOC Initial Note (Signed)
Transition of Care [facility]) - Initial/Assessment Note    Patient Details  Name: Carla Taylor MRN: [id number] Date of Birth: 06-12-1925  Transition of Care [facility] CM/SW Contact:    Lynnell Catalan, RN Phone Number: 01/23/2022, 1:37 PM  Clinical Narrative:                 Spoke with pt and daughter at bedside for dc planning. Pt active with [facility] for home health PT. Pt wants to continue services at dc. Spoke with Marjory Lies at [facility] to inform of orders for resumption of services.  Expected Discharge Plan: [place] Barriers to Discharge: Continued Medical Work up   Patient Goals and CMS Choice Patient states their goals for this hospitalization and ongoing recovery are:: To go home CMS Medicare.gov Compare Post Acute Care list provided to:: Patient Choice offered to / list presented to : Patient  Expected Discharge Plan and Services Expected Discharge Plan: [place]   Discharge Planning Services: CM Consult Post Acute Care Choice: [place] arrangements for the past 2 months: Single Family Home                           HH Arranged: PT, OT, Nurse's Aide, RN HH Agency: [place] Date [place]: 01/23/22 Time HH Agency Contacted: 1000 Representative spoke with at [place]: Marjory Lies  Prior Living Arrangements/Services Living arrangements for the past 2 months: [place] Lives with:: Adult Children Patient language and need for interpreter reviewed:: Yes Do you feel safe going back to the place where you live?: Yes      Need for Family Participation in Patient Care: Yes (Comment) Care giver support system in place?: Yes (comment) Current home services: Home PT Criminal Activity/Legal Involvement Pertinent to Current Situation/Hospitalization: No - Comment as needed  Activities of Daily Living Home Assistive Devices/Equipment: Gilford Rile (specify type) ADL Screening (condition at time of  admission) Patient's cognitive ability adequate to safely complete daily activities?: Yes Is the patient deaf or have difficulty hearing?: No Does the patient have difficulty seeing, even when wearing glasses/contacts?: No Does the patient have difficulty concentrating, remembering, or making decisions?: No Patient able to express need for assistance with ADLs?: Yes Does the patient have difficulty dressing or bathing?: No Independently performs ADLs?: Yes (appropriate for developmental age) Does the patient have difficulty walking or climbing stairs?: Yes Weakness of Legs: Both Weakness of Arms/Hands: None  Permission Sought/Granted Permission sought to share information with : Facility Art therapist granted to share information with : Yes, Verbal Permission Granted     Permission granted to share info w AGENCY: [facility]        Emotional Assessment Appearance:: Appears stated age Attitude/Demeanor/Rapport: Gracious Affect (typically observed): Calm Orientation: : Oriented to Self, Oriented to Place, Oriented to  Time, Oriented to Situation Alcohol / Substance Use: Not Applicable Psych Involvement: No (comment)  Admission diagnosis:  Acute respiratory failure with hypoxia (HCC) [J96.01] PNA (pneumonia) [J18.9] Anemia, unspecified type [D64.9] Community acquired pneumonia, unspecified laterality [J18.9] Patient Active Problem List   Diagnosis Date Noted   Acute respiratory failure with hypoxia ([place]) 01/21/2022   PNA (pneumonia) 01/21/2022   Nail, injury by, initial encounter 04/12/2021   Blood clotting disorder ([place]) 01/04/2021   Primary localized osteoarthritis of right knee 02/05/2016   S/P total knee arthroplasty 02/05/2016   Primary osteoarthritis of right shoulder 09/19/2014   Preop cardiovascular  exam 05/18/2014   Edema 05/18/2014   Hypokalemia 05/07/2014   Morbid obesity ([place]) 05/06/2014   Osteoarthritis, shoulder 05/06/2014   Hypothyroidism  05/06/2014   Acute pancreatitis 05/05/2014   Essential hypertension 05/05/2014   PCP:  Antony Contras, MD Pharmacy:   [facility] ([place], [place] [facility] [address] [place] [address] [place] [address] Phone: [phone] Fax: [place]), [place] - [address] [place] ([place]) [place] [address] Phone: [phone] Fax: [phone]     Social Determinants of Health (SDOH) Interventions    Readmission Risk Interventions    01/23/2022    1:30 PM  Readmission Risk Prevention Plan  Transportation Screening Complete  PCP or Specialist Appt within 5-7 Days Complete  Home Care Screening Complete  Medication Review (RN CM) Complete

## 2022-01-23 NOTE — Progress Notes (Signed)
Patient being discharged home with Family. PIV and Tele removed per orders. Patient sent home with foley (that she was admitted with) attached to new leg bag. Discharge teaching completed and all questions answered. Patient sent home with all personal belongings.

## 2022-01-23 NOTE — Progress Notes (Signed)
Weaned patient to 1L of O2 while at rest then to RA. Sating 97% on RA at rest. While ambulating in hall on RA with 95% SpO2. MD notified.

## 2022-01-23 NOTE — Plan of Care (Signed)

## 2022-01-23 NOTE — Discharge Summary (Signed)
Physician Discharge Summary  Carla Taylor GBT:[id number] DOB: 1924/08/13 DOA: 01/20/2022  PCP: Antony Contras, MD  Admit date: 01/20/2022 Discharge date: 01/23/2022  Admitted From: Home Disposition: Home  Recommendations for Outpatient Follow-up:  Follow up with PCP in 1-2 weeks Discontinued amlodipine and hydrochlorothiazide Started on furosemide 40 mg p.o. daily Please obtain BMP in one week to assess potassium level and renal function Patient instructed to maintain daily weights, please review at next visit  Home Health: PT/OT/aide/RN Equipment/Devices: Chronic Foley catheter, followed by urology outpatient.  Discharge Condition: Stable CODE STATUS: DNR Diet recommendation: Heart healthy diet  History of present illness:  UBAH Taylor is a 88 y.o. female with past medical history significant for essential hypertension, hypothyroidism, hyperlipidemia, CKD stage IIIb who presented to [facility] ED on 7/3 from home with shortness of breath, progressive lower extremity edema.  Patient was noted by home health aide that her oxygen saturation was in the 80s at rest.  Was seen by her PCP same day and was noted again to have a low SPO2 and was sent to the ED for further evaluation.  Patient recently completed antibiotic course for pneumonia roughly 2 weeks ago.   In the ED, temperature 98.4 F, HR 64, RR 17, BP 144/72, SPO2 94% on 2 L nasal cannula.  WBC 9.1, hemoglobin 9.3, platelets 201.  Sodium 141, potassium 3.5, chloride 106, CO2 27, glucose 110, BUN 26, creatinine 1.26.  BNP 247.8.  FOBT negative.  Chest x-ray with left greater than right bibasilar airspace disease.  TRH was consulted for further evaluation management of acute hypoxic respiratory failure secondary to pneumonia versus diastolic CHF exacerbation.  Hospital course:  Acute hypoxic respiratory failure Etiology likely secondary to pneumonia versus diastolic CHF exacerbation.  Was notably hypoxic at home and at PCP office.  Not oxygen  dependent at baseline.  Patient was started on antibiotics and IV diuresis with improvement of respiratory status was able to be titrated off of supplemental oxygen with no desaturations on ambulation.  Pneumonia Chest x-ray with left greater than right bibasilar airspace disease.  Patient was started on IV azithromycin and ceftriaxone I will continue azithromycin and cefdinir on discharge to complete a 5 and 7-day antibiotic course respectively.    Acute on chronic diastolic congestive heart failure Patient presenting with progressive lower extremity edema.  BNP elevated on admission.  TTE with LVEF 55-60%, no LV regional wall motion abnormalities, LA mildly dilated, moderate MR, no aortic stenosis, IVC normal in size.  Patient was started on IV diuresis with Lasix with improvement of her respiratory symptoms.  We will discontinue home amlodipine as this can cause lower extremity edema as well as hydrochlorothiazide in favor of furosemide 40 mg p.o. daily.  Recommend outpatient follow-up with PCP in 1 week with repeat BMP to ensure renal function potassium level remained stable.  Patient instructed to maintain daily weight log to bring to next PCP visit for evaluation.   Hypokalemia Repleted during hospitalization, discharging on potassium 20 mEq daily given now transition to Lasix as above.  Repeat BMP 1 week.   Normocytic anemia Hemoglobin 9.1, stable.  Anemia panel with iron 33, TIBC 287, ferritin 88.  FOBT negative.   Essential hypertension Home regimen includes amlodipine 5 mg p.o. daily, HCTZ 25 mg p.o. daily, lisinopril 40 mg p.o. daily, Cardura 2 mg p.o. daily, metoprolol succinate 75 mg p.o. daily.  Amlodipine was discontinued due to lower extremity edema and hydrochlorothiazide was also discontinued in favor of furosemide.  Outpatient  follow-up PCP   Hyperlipidemia Currently not on statin outpatient.   Hypothyroidism Continue levothyroxine 88 mcg p.o. daily   CKD stage  IIIb Creatinine remained stable during hospitalization, 1.07 at time of discharge.  Recommend repeat BMP 1 week.   Chronic urinary retention Continue Foley catheter, Outpatient follow-up with urology, Dr. Claudia Desanctis   Weakness/deconditioning: Evaluated by PT/OT with recommendations of home health.   Discharge Diagnoses:  Principal Problem:   Acute respiratory failure with hypoxia (HCC) Active Problems:   PNA (pneumonia)    Discharge Instructions  Discharge Instructions     Call MD for:  difficulty breathing, headache or visual disturbances   Complete by: As directed    Call MD for:  extreme fatigue   Complete by: As directed    Call MD for:  persistant dizziness or light-headedness   Complete by: As directed    Call MD for:  persistant nausea and vomiting   Complete by: As directed    Call MD for:  severe uncontrolled pain   Complete by: As directed    Call MD for:  temperature >100.4   Complete by: As directed    Diet - low sodium heart healthy   Complete by: As directed    Increase activity slowly   Complete by: As directed       Allergies as of 01/23/2022   No Known Allergies      Medication List     STOP taking these medications    amLODipine 5 MG tablet Commonly known as: NORVASC   amoxicillin 500 MG capsule Commonly known as: AMOXIL   baclofen 10 MG tablet Commonly known as: LIORESAL   benzonatate 100 MG capsule Commonly known as: TESSALON   cephALEXin 500 MG capsule Commonly known as: KEFLEX   hydrochlorothiazide 25 MG tablet Commonly known as: HYDRODIURIL   HYDROcodone-acetaminophen 10-325 MG tablet Commonly known as: Norco   ondansetron 4 MG tablet Commonly known as: Zofran   rivaroxaban 10 MG Tabs tablet Commonly known as: Xarelto   sennosides-docusate sodium 8.6-50 MG tablet Commonly known as: SENOKOT-S       TAKE these medications    acetaminophen 650 MG CR tablet Commonly known as: TYLENOL Take 1,300 mg by mouth every 6  (six) hours as needed for pain.   azithromycin 500 MG tablet Commonly known as: Zithromax Take 1 tablet (500 mg total) by mouth daily for 1 day. Take 1 tablet daily for 3 days. Start taking on: January 24, 2022   CALCIUM 600 + D PO Take 600 mg by mouth in the morning and at bedtime.   cefdinir 300 MG capsule Commonly known as: OMNICEF Take 1 capsule (300 mg total) by mouth 2 (two) times daily for 3 days. Start taking on: January 24, 2022   dorzolamide-timolol 22.3-6.8 MG/ML ophthalmic solution Commonly known as: COSOPT Place 1 drop into the left eye 2 (two) times daily.   doxazosin 2 MG tablet Commonly known as: CARDURA TAKE 1 TABLET BY MOUTH  DAILY  PLEASE OBTAIN FUTURE  REFILLS FROM PCP. What changed: See the new instructions.   estradiol 0.1 MG/GM vaginal cream Commonly known as: ESTRACE Place 1 Applicatorful vaginally 2 (two) times a week. Tuesday and Friday   furosemide 40 MG tablet Commonly known as: Lasix Take 1 tablet (40 mg total) by mouth daily.   IBUPROFEN PO Take 2 tablets by mouth daily as needed (headache).   ICAPS AREDS FORMULA PO Take 1 tablet by mouth 2 (two) times daily.   latanoprost  0.005 % ophthalmic solution Commonly known as: XALATAN Place 1 drop into the left eye at bedtime.   levothyroxine 88 MCG tablet Commonly known as: SYNTHROID Take 88 mcg by mouth daily before breakfast.   lisinopril 40 MG tablet Commonly known as: ZESTRIL TAKE 1 TABLET BY MOUTH  DAILY   metoprolol succinate 50 MG 24 hr tablet Commonly known as: TOPROL-XL Take 1 and 1/2 tablets by mouth daily. <PLEASE MAKE APPOINTMENT FOR REFILLS> What changed:  how much to take how to take this when to take this additional instructions   oxyCODONE-acetaminophen 7.5-325 MG tablet Commonly known as: PERCOCET Take 1 tablet by mouth every 6 (six) hours as needed for moderate pain or severe pain.   potassium chloride SA 20 MEQ tablet Commonly known as: KLOR-CON M Take 1 tablet (20 mEq  total) by mouth daily.   SYSTANE OP Place 1 drop into both eyes as needed (dryness/itching).   vitamin B-12 500 MCG tablet Commonly known as: CYANOCOBALAMIN Take 500 mcg by mouth daily.   Vitamin D (Ergocalciferol) 1.25 MG (50000 UNIT) Caps capsule Commonly known as: DRISDOL Take 50,000 Units by mouth every 'Sunday.        Follow-up Information     Swayne, David, MD. Schedule an appointment as soon as possible for a visit in 1 week(s).   Specialty: Family Medicine Contact information: [address], [address] [place] [address] [phone]                No Known Allergies  Consultations: None   Procedures/Studies: ECHOCARDIOGRAM COMPLETE  Result Date: 01/22/2022    ECHOCARDIOGRAM REPORT   [name] Date of Exam: 01/22/2022 Medical Rec #:  [id number]    Height:       62.0 in Accession #:    [id number]   Weight:       191.0 lb Date of Birth:  01/06/1925    BSA:          1.875 m Patient Age:    88 years     BP:           143/62 mmHg Patient Gender: F            HR:           65'$  bpm. Exam Location:  Inpatient Procedure: 2D Echo, Cardiac Doppler and Color Doppler Indications:    CHF-Acute Diastolic Z61.09  History:        Patient has prior history of Echocardiogram examinations, most                 recent 06/06/2014. Risk Factors:Hypertension and Dyslipidemia.                 Chronic kidney disease. Hypothyroidism.  Referring Phys: [place]  1. Left ventricular ejection fraction, by estimation, is 55 to 60%. The left ventricle has normal function. The left ventricle has no regional wall motion abnormalities. There is mild concentric left ventricular hypertrophy. Elevated left atrial pressure.  2. Right ventricular systolic function is normal. The right ventricular size is normal. Tricuspid regurgitation signal is inadequate for assessing PA pressure.  3. Left atrial size was mildly dilated.  4. The mitral valve is normal in  structure. Moderate mitral valve regurgitation. No evidence of mitral stenosis. Moderate mitral annular calcification.  5. The aortic valve has an indeterminant number of cusps. Aortic valve regurgitation is trivial. Aortic valve sclerosis is present, with no evidence of aortic  valve stenosis.  6. The inferior vena cava is normal in size with greater than 50% respiratory variability, suggesting right atrial pressure of 3 mmHg. FINDINGS  Left Ventricle: Left ventricular ejection fraction, by estimation, is 55 to 60%. The left ventricle has normal function. The left ventricle has no regional wall motion abnormalities. The left ventricular internal cavity size was normal in size. There is  mild concentric left ventricular hypertrophy. Left ventricular diastolic function could not be evaluated due to mitral annular calcification (moderate or greater). Elevated left atrial pressure. Right Ventricle: The right ventricular size is normal. No increase in right ventricular wall thickness. Right ventricular systolic function is normal. Tricuspid regurgitation signal is inadequate for assessing PA pressure. Left Atrium: Left atrial size was mildly dilated. Right Atrium: Right atrial size was normal in size. Pericardium: There is no evidence of pericardial effusion. Presence of epicardial fat layer. Mitral Valve: The mitral valve is normal in structure. Moderate mitral annular calcification. Moderate mitral valve regurgitation. No evidence of mitral valve stenosis. Tricuspid Valve: The tricuspid valve is normal in structure. Tricuspid valve regurgitation is trivial. No evidence of tricuspid stenosis. Aortic Valve: The aortic valve has an indeterminant number of cusps. Aortic valve regurgitation is trivial. Aortic valve sclerosis is present, with no evidence of aortic valve stenosis. Pulmonic Valve: The pulmonic valve was not well visualized. Pulmonic valve regurgitation is trivial. No evidence of pulmonic stenosis. Aorta: The  aortic root is normal in size and structure. Venous: The inferior vena cava is normal in size with greater than 50% respiratory variability, suggesting right atrial pressure of 3 mmHg. IAS/Shunts: No atrial level shunt detected by color flow Doppler.  LEFT VENTRICLE PLAX 2D LVIDd:         4.30 cm   Diastology LVIDs:         2.80 cm   LV e' medial:    4.05 cm/s LV PW:         1.20 cm   LV E/e' medial:  38.3 LV IVS:        1.20 cm   LV e' lateral:   8.55 cm/s LVOT diam:     1.80 cm   LV E/e' lateral: 18.1 LV SV:         61 LV SV Index:   33 LVOT Area:     2.54 cm  RIGHT VENTRICLE RV S prime:     13.20 cm/s TAPSE (M-mode): 2.0 cm LEFT ATRIUM             Index        RIGHT ATRIUM          Index LA diam:        3.70 cm 1.97 cm/m   RA Area:     9.95 cm LA Vol (A2C):   75.4 ml 40.22 ml/m  RA Volume:   17.30 ml 9.23 ml/m LA Vol (A4C):   68.3 ml 36.43 ml/m LA Biplane Vol: 73.4 ml 39.15 ml/m  AORTIC VALVE LVOT Vmax:   114.00 cm/s LVOT Vmean:  72.200 cm/s LVOT VTI:    0.240 m  AORTA Ao Root diam: 3.00 cm MITRAL VALVE MV Area (PHT): 4.83 cm     SHUNTS MV Decel Time: 157 msec     Systemic VTI:  0.24 m MR Peak grad: 122.8 mmHg    Systemic Diam: 1.80 cm MR Mean grad: 88.0 mmHg MR Vmax:      554.00 cm/s MR Vmean:     450.0 cm/s MV E velocity:  155.00 cm/s MV A velocity: 116.00 cm/s MV E/A ratio:  1.34 Kardie Tobb DO Electronically signed by Berniece Salines DO Signature Date/Time: 01/22/2022/2:05:13 PM    Final    DG Chest 2 View  Result Date: 01/20/2022 CLINICAL DATA:  Shortness of breath EXAM: CHEST - 2 VIEW COMPARISON:  11/08/2021 FINDINGS: Right shoulder replacement. Hazy ill-defined left greater than right basilar airspace disease. Possible tiny effusions. Stable cardiomediastinal silhouette with aortic atherosclerosis. No pneumothorax IMPRESSION: 1. Ill-defined hazy left greater than right basilar airspace disease which may be due to atelectasis or pneumonia 2. Possible tiny pleural effusions Electronically Signed   By:  Donavan Foil M.D.   On: 01/20/2022 20:19     Subjective: Patient seen examined bedside, resting comfortably.  No complaints this morning.  No desaturations once again on ambulatory O2 screen today.  Discharging home.  No other questions or concerns at this time.  Patient instructed to maintain daily weights and to follow-up with PCP in 1-2 weeks.  Denies headache, no dizziness, no chest pain, no palpitations, no shortness of breath, no abdominal pain, no focal weakness, no fever/chills/night sweats, no nausea/vomiting/diarrhea, no abdominal pain, no paresthesias.  No acute events overnight per nurse staff.  Discharge Exam: Vitals:   01/23/22 1325 01/23/22 1336  BP:    Pulse:    Resp:    Temp:    SpO2: 95% 97%   Vitals:   01/23/22 1034 01/23/22 1318 01/23/22 1325 01/23/22 1336  BP: (!) 143/53 (!) 136/53    Pulse: 66 72    Resp:  19    Temp:  (!) 97.4 F (36.3 C)    TempSrc:  Oral    SpO2: 98% 96% 95% 97%  Weight:      Height:        Physical Exam: GEN: NAD, alert and oriented x 3, elderly in appearance HEENT: NCAT, PERRL, EOMI, sclera clear, MMM PULM: CTAB w/o wheezes/crackles, normal respiratory effort, on room air CV: RRR w/o M/G/R GI: abd soft, NTND, NABS, no R/G/M MSK: Trace to 1+ bilateral peripheral edema bilateral lower extremities to mid shin, muscle strength globally intact 5/5 bilateral upper/lower extremities NEURO: CN II-XII intact, no focal deficits, sensation to light touch intact PSYCH: normal mood/affect Integumentary: dry/intact, no rashes or wounds    The results of significant diagnostics from this hospitalization (including imaging, microbiology, ancillary and laboratory) are listed below for reference.     Microbiology: No results found for this or any previous visit (from the past 240 hour(s)).   Labs: BNP (last 3 results) Recent Labs    11/08/21 2100 01/20/22 2016  BNP 154.4* 785.8*   Basic Metabolic Panel: Recent Labs  Lab  01/20/22 2016 01/21/22 0530 01/22/22 0539 01/23/22 0650  NA 141 143 141 137  K 3.5 3.2* 3.5 3.7  CL 106 107 107 100  CO2 '27 28 29 29  '$ GLUCOSE 110* 94 100* 115*  BUN 26* 28* 26* 23  CREATININE 1.26* 1.08* 0.94 1.07*  CALCIUM 9.5 8.8* 9.1 8.6*  MG  --  1.9 2.0 2.0  PHOS  --  3.5  --   --    Liver Function Tests: Recent Labs  Lab 01/21/22 0530  AST 14*  ALT 11  ALKPHOS 67  BILITOT 0.5  PROT 6.4*  ALBUMIN 3.1*   No results for input(s): "LIPASE", "AMYLASE" in the last 168 hours. No results for input(s): "AMMONIA" in the last 168 hours. CBC: Recent Labs  Lab 01/20/22 2124 01/21/22 0530 01/22/22 0539  WBC 9.1 8.6 8.9  NEUTROABS  --  5.5  --   HGB 9.3* 8.9* 9.1*  HCT 29.5* 28.2* 29.0*  MCV 97.0 96.2 97.3  PLT 201 194 185   Cardiac Enzymes: No results for input(s): "CKTOTAL", "CKMB", "CKMBINDEX", "TROPONINI" in the last 168 hours. BNP: Invalid input(s): "POCBNP" CBG: No results for input(s): "GLUCAP" in the last 168 hours. D-Dimer No results for input(s): "DDIMER" in the last 72 hours. Hgb A1c No results for input(s): "HGBA1C" in the last 72 hours. Lipid Profile No results for input(s): "CHOL", "HDL", "LDLCALC", "TRIG", "CHOLHDL", "LDLDIRECT" in the last 72 hours. Thyroid function studies No results for input(s): "TSH", "T4TOTAL", "T3FREE", "THYROIDAB" in the last 72 hours.  Invalid input(s): "FREET3" Anemia work up Recent Labs    01/22/22 0539  FERRITIN 88  TIBC 287  IRON 33   Urinalysis    Component Value Date/Time   COLORURINE YELLOW 07/19/2016 1528   APPEARANCEUR CLOUDY (A) 07/19/2016 1528   LABSPEC 1.019 07/19/2016 1528   PHURINE 7.0 07/19/2016 1528   GLUCOSEU NEGATIVE 07/19/2016 1528   [place] 07/19/2016 [place] 07/19/2016 [employer] 07/19/2016 [place] 07/19/2016 1528   UROBILINOGEN 0.2 04/28/2014 1733   NITRITE NEGATIVE 07/19/2016 1528   LEUKOCYTESUR MODERATE (A) 07/19/2016 1528    Sepsis Labs Recent Labs  Lab 01/20/22 2124 01/21/22 0530 01/22/22 0539  WBC 9.1 8.6 8.9   Microbiology No results found for this or any previous visit (from the past 240 hour(s)).   Time coordinating discharge: Over 30 minutes  SIGNED:   Donnamarie Poag [place] DO  [facility] 01/23/2022, 1:41 PM

## 2022-01-26 DIAGNOSIS — K5900 Constipation, unspecified: Secondary | ICD-10-CM | POA: Diagnosis not present

## 2022-01-26 DIAGNOSIS — Z556 Problems related to health literacy: Secondary | ICD-10-CM | POA: Diagnosis not present

## 2022-01-26 DIAGNOSIS — R296 Repeated falls: Secondary | ICD-10-CM | POA: Diagnosis not present

## 2022-01-26 DIAGNOSIS — H409 Unspecified glaucoma: Secondary | ICD-10-CM | POA: Diagnosis not present

## 2022-01-26 DIAGNOSIS — N3946 Mixed incontinence: Secondary | ICD-10-CM | POA: Diagnosis not present

## 2022-01-26 DIAGNOSIS — M549 Dorsalgia, unspecified: Secondary | ICD-10-CM | POA: Diagnosis not present

## 2022-01-28 DIAGNOSIS — J9601 Acute respiratory failure with hypoxia: Secondary | ICD-10-CM | POA: Diagnosis not present

## 2022-01-28 DIAGNOSIS — E876 Hypokalemia: Secondary | ICD-10-CM | POA: Diagnosis not present

## 2022-01-28 DIAGNOSIS — J189 Pneumonia, unspecified organism: Secondary | ICD-10-CM | POA: Diagnosis not present

## 2022-01-28 DIAGNOSIS — N1832 Chronic kidney disease, stage 3b: Secondary | ICD-10-CM | POA: Diagnosis not present

## 2022-01-28 DIAGNOSIS — I5032 Chronic diastolic (congestive) heart failure: Secondary | ICD-10-CM | POA: Diagnosis not present

## 2022-01-28 DIAGNOSIS — I10 Essential (primary) hypertension: Secondary | ICD-10-CM | POA: Diagnosis not present

## 2022-01-28 DIAGNOSIS — Z09 Encounter for follow-up examination after completed treatment for conditions other than malignant neoplasm: Secondary | ICD-10-CM | POA: Diagnosis not present

## 2022-01-29 DIAGNOSIS — K5900 Constipation, unspecified: Secondary | ICD-10-CM | POA: Diagnosis not present

## 2022-01-29 DIAGNOSIS — H409 Unspecified glaucoma: Secondary | ICD-10-CM | POA: Diagnosis not present

## 2022-01-29 DIAGNOSIS — Z556 Problems related to health literacy: Secondary | ICD-10-CM | POA: Diagnosis not present

## 2022-01-29 DIAGNOSIS — M549 Dorsalgia, unspecified: Secondary | ICD-10-CM | POA: Diagnosis not present

## 2022-01-29 DIAGNOSIS — N3946 Mixed incontinence: Secondary | ICD-10-CM | POA: Diagnosis not present

## 2022-01-29 DIAGNOSIS — R296 Repeated falls: Secondary | ICD-10-CM | POA: Diagnosis not present

## 2022-01-30 DIAGNOSIS — H409 Unspecified glaucoma: Secondary | ICD-10-CM | POA: Diagnosis not present

## 2022-01-30 DIAGNOSIS — N3946 Mixed incontinence: Secondary | ICD-10-CM | POA: Diagnosis not present

## 2022-01-30 DIAGNOSIS — Z556 Problems related to health literacy: Secondary | ICD-10-CM | POA: Diagnosis not present

## 2022-01-30 DIAGNOSIS — R296 Repeated falls: Secondary | ICD-10-CM | POA: Diagnosis not present

## 2022-01-30 DIAGNOSIS — K5900 Constipation, unspecified: Secondary | ICD-10-CM | POA: Diagnosis not present

## 2022-01-30 DIAGNOSIS — M549 Dorsalgia, unspecified: Secondary | ICD-10-CM | POA: Diagnosis not present

## 2022-02-03 DIAGNOSIS — H409 Unspecified glaucoma: Secondary | ICD-10-CM | POA: Diagnosis not present

## 2022-02-03 DIAGNOSIS — M549 Dorsalgia, unspecified: Secondary | ICD-10-CM | POA: Diagnosis not present

## 2022-02-03 DIAGNOSIS — K5900 Constipation, unspecified: Secondary | ICD-10-CM | POA: Diagnosis not present

## 2022-02-03 DIAGNOSIS — R296 Repeated falls: Secondary | ICD-10-CM | POA: Diagnosis not present

## 2022-02-03 DIAGNOSIS — Z556 Problems related to health literacy: Secondary | ICD-10-CM | POA: Diagnosis not present

## 2022-02-03 DIAGNOSIS — N3946 Mixed incontinence: Secondary | ICD-10-CM | POA: Diagnosis not present

## 2022-02-04 DIAGNOSIS — R296 Repeated falls: Secondary | ICD-10-CM | POA: Diagnosis not present

## 2022-02-04 DIAGNOSIS — K5900 Constipation, unspecified: Secondary | ICD-10-CM | POA: Diagnosis not present

## 2022-02-04 DIAGNOSIS — Z556 Problems related to health literacy: Secondary | ICD-10-CM | POA: Diagnosis not present

## 2022-02-04 DIAGNOSIS — N3946 Mixed incontinence: Secondary | ICD-10-CM | POA: Diagnosis not present

## 2022-02-04 DIAGNOSIS — H409 Unspecified glaucoma: Secondary | ICD-10-CM | POA: Diagnosis not present

## 2022-02-04 DIAGNOSIS — M549 Dorsalgia, unspecified: Secondary | ICD-10-CM | POA: Diagnosis not present

## 2022-02-05 DIAGNOSIS — N3946 Mixed incontinence: Secondary | ICD-10-CM | POA: Diagnosis not present

## 2022-02-05 DIAGNOSIS — Z556 Problems related to health literacy: Secondary | ICD-10-CM | POA: Diagnosis not present

## 2022-02-05 DIAGNOSIS — K5900 Constipation, unspecified: Secondary | ICD-10-CM | POA: Diagnosis not present

## 2022-02-05 DIAGNOSIS — R296 Repeated falls: Secondary | ICD-10-CM | POA: Diagnosis not present

## 2022-02-05 DIAGNOSIS — M549 Dorsalgia, unspecified: Secondary | ICD-10-CM | POA: Diagnosis not present

## 2022-02-05 DIAGNOSIS — H409 Unspecified glaucoma: Secondary | ICD-10-CM | POA: Diagnosis not present

## 2022-02-09 DIAGNOSIS — Z556 Problems related to health literacy: Secondary | ICD-10-CM | POA: Diagnosis not present

## 2022-02-09 DIAGNOSIS — H409 Unspecified glaucoma: Secondary | ICD-10-CM | POA: Diagnosis not present

## 2022-02-09 DIAGNOSIS — R296 Repeated falls: Secondary | ICD-10-CM | POA: Diagnosis not present

## 2022-02-09 DIAGNOSIS — J189 Pneumonia, unspecified organism: Secondary | ICD-10-CM | POA: Diagnosis not present

## 2022-02-09 DIAGNOSIS — N3946 Mixed incontinence: Secondary | ICD-10-CM | POA: Diagnosis not present

## 2022-02-09 DIAGNOSIS — Z9181 History of falling: Secondary | ICD-10-CM | POA: Diagnosis not present

## 2022-02-09 DIAGNOSIS — K5900 Constipation, unspecified: Secondary | ICD-10-CM | POA: Diagnosis not present

## 2022-02-09 DIAGNOSIS — R339 Retention of urine, unspecified: Secondary | ICD-10-CM | POA: Diagnosis not present

## 2022-02-09 DIAGNOSIS — I5033 Acute on chronic diastolic (congestive) heart failure: Secondary | ICD-10-CM | POA: Diagnosis not present

## 2022-02-09 DIAGNOSIS — E785 Hyperlipidemia, unspecified: Secondary | ICD-10-CM | POA: Diagnosis not present

## 2022-02-09 DIAGNOSIS — M549 Dorsalgia, unspecified: Secondary | ICD-10-CM | POA: Diagnosis not present

## 2022-02-09 DIAGNOSIS — E039 Hypothyroidism, unspecified: Secondary | ICD-10-CM | POA: Diagnosis not present

## 2022-02-09 DIAGNOSIS — D631 Anemia in chronic kidney disease: Secondary | ICD-10-CM | POA: Diagnosis not present

## 2022-02-09 DIAGNOSIS — Z466 Encounter for fitting and adjustment of urinary device: Secondary | ICD-10-CM | POA: Diagnosis not present

## 2022-02-09 DIAGNOSIS — J9601 Acute respiratory failure with hypoxia: Secondary | ICD-10-CM | POA: Diagnosis not present

## 2022-02-09 DIAGNOSIS — E876 Hypokalemia: Secondary | ICD-10-CM | POA: Diagnosis not present

## 2022-02-09 DIAGNOSIS — Z87440 Personal history of urinary (tract) infections: Secondary | ICD-10-CM | POA: Diagnosis not present

## 2022-02-09 DIAGNOSIS — I130 Hypertensive heart and chronic kidney disease with heart failure and stage 1 through stage 4 chronic kidney disease, or unspecified chronic kidney disease: Secondary | ICD-10-CM | POA: Diagnosis not present

## 2022-02-09 DIAGNOSIS — N1832 Chronic kidney disease, stage 3b: Secondary | ICD-10-CM | POA: Diagnosis not present

## 2022-02-12 DIAGNOSIS — Z466 Encounter for fitting and adjustment of urinary device: Secondary | ICD-10-CM | POA: Diagnosis not present

## 2022-02-12 DIAGNOSIS — H409 Unspecified glaucoma: Secondary | ICD-10-CM | POA: Diagnosis not present

## 2022-02-12 DIAGNOSIS — J189 Pneumonia, unspecified organism: Secondary | ICD-10-CM | POA: Diagnosis not present

## 2022-02-12 DIAGNOSIS — J9601 Acute respiratory failure with hypoxia: Secondary | ICD-10-CM | POA: Diagnosis not present

## 2022-02-12 DIAGNOSIS — N3946 Mixed incontinence: Secondary | ICD-10-CM | POA: Diagnosis not present

## 2022-02-12 DIAGNOSIS — K5900 Constipation, unspecified: Secondary | ICD-10-CM | POA: Diagnosis not present

## 2022-02-13 ENCOUNTER — Encounter: Payer: Self-pay | Admitting: Pulmonary Disease

## 2022-02-13 ENCOUNTER — Ambulatory Visit (INDEPENDENT_AMBULATORY_CARE_PROVIDER_SITE_OTHER): Payer: Medicare Other | Admitting: Pulmonary Disease

## 2022-02-13 VITALS — BP 110/64 | HR 73 | Temp 97.5°F | Ht 62.0 in | Wt 172.0 lb

## 2022-02-13 DIAGNOSIS — J690 Pneumonitis due to inhalation of food and vomit: Secondary | ICD-10-CM

## 2022-02-13 DIAGNOSIS — J9601 Acute respiratory failure with hypoxia: Secondary | ICD-10-CM

## 2022-02-13 NOTE — Progress Notes (Signed)
PCCM attending:  This is a 88 year old female seen today in clinic for oxygen need.  She was recently discharged from the hospital with pneumonia.  Was treated with antibiotics.  Now has weaned off of oxygen.  Has normal saturations on room air.  She is able to get around to do most things that she needs to do.  Her sons with her in the office to helps take care of her.  She has no respiratory complaints today.  They are planning to go to [place] after the appointment.  BP 110/64 (BP Location: Right Arm)   Pulse 73   Temp (!) 97.5 F (36.4 C) (Oral)   Ht '5\' 2"'$  (1.575 m)   Wt 172 lb (78 kg)   SpO2 98%   BMI 31.46 kg/m   General: Elderly female no acute distress resting. HEENT: NCAT tracking appropriately Heart: Regular rhythm S1-S2 Lungs: Clear to auscultation bilaterally Abdomen: Soft nontender  Labs: Reviewed Chest x-ray: Hazy right basilar atelectasis/infiltrate. The patient's images have been independently reviewed by me.    Assessment: Pneumonia, Respiratory failure requiring oxygen supplementation, now resolved  Plan: No longer requiring oxygen. Continue routine ADLs. We talked about aspiration precautions today in the office.  As you get older patients are definitely at high risk for getting recurrent aspiration pneumonia.  Follow-up with [place] in clinic as needed. We appreciate consultation.  I agree with documentation from Farrel Gordon, DO PGY 2.   Garner Nash, DO [facility] 02/13/2022 12:07 PM

## 2022-02-13 NOTE — Progress Notes (Signed)
Synopsis: Referred in July 2023 for exertional dyspnea by Carla Contras, MD  Subjective:   PATIENT ID: Carla Taylor GENDER: female DOB: 18-Oct-1924, MRN: [id number]  No chief complaint on file.   Carla Taylor was referred to our clinic due to concern for chronic respiratory failure with hypoxia.   She was recently discharged from the hospital where she was admitted due to acute hypoxic respiratory failure due to pneumonia. She required supplemental oxygen at admission but was ultimately able to be weaned to room air with appropriate oxygen saturations.She does not require supplemental oxygen at baseline.   She states that she has noticed a mild decrease in the amount of time that she is able to exert herself compared to prior to recent hospitalization. She will get up and complete a task, then sit to rest for a bit before getting back up. She has had fatigue and generalized weakness but notes that the weakness has been present over the last few years. She notes the fatigue began after her hospitalization.  She is otherwise asymptomatic today. No smoking history.  Past Medical History:  Diagnosis Date   Arthritis    Chronic shoulder pain    Hyperlipidemia    Hypertension    Hypothyroid    Macular degeneration    Pancreatitis    Primary localized osteoarthritis of right knee 02/05/2016   Primary osteoarthritis of right shoulder 09/19/2014   Shortness of breath dyspnea    on exertion   Stress bladder incontinence, female      Family History  Problem Relation Age of Onset   CVA Mother    CVA Father      Past Surgical History:  Procedure Laterality Date   carpal tunnel Bilateral    CATARACT EXTRACTION Bilateral    2005   EYE SURGERY     KNEE ARTHROSCOPY Bilateral    2001   No previous surgeries     TOTAL KNEE ARTHROPLASTY Right 02/05/2016   TOTAL KNEE ARTHROPLASTY Right 02/05/2016   Procedure: RIGHT TOTAL KNEE ARTHROPLASTY;  Surgeon: Marchia Bond, MD;  Location: [facility];   Service: Orthopedics;  Laterality: Right;   TOTAL SHOULDER ARTHROPLASTY Right 09/19/2014   Procedure: RIGHT TOTAL SHOULDER ARTHROPLASTY;  Surgeon: Johnny Bridge, MD;  Location: [place];  Service: Orthopedics;  Laterality: Right;    Social History   Socioeconomic History   Marital status: Widowed    Spouse name: Not on file   Number of children: 4   Years of education: Not on file   Highest education level: Not on file  Occupational History    Employer: [employer]  Tobacco Use   Smoking status: Never   Smokeless tobacco: Never  Substance and Sexual Activity   Alcohol use: No    Alcohol/week: 0.0 standard drinks of alcohol   Drug use: No   Sexual activity: Never  Other Topics Concern   Not on file  Social History Narrative   Not on file   Social Determinants of Health   Financial Resource Strain: Not on file  Food Insecurity: Not on file  Transportation Needs: Not on file  Physical Activity: Not on file  Stress: Not on file  Social Connections: Not on file  Intimate Partner Violence: Not on file     No Known Allergies   Outpatient Medications Prior to Visit  Medication Sig Dispense Refill   acetaminophen (TYLENOL) 650 MG CR tablet Take 1,300 mg by mouth every 6 (six) hours as  needed for pain.     Calcium Carb-Cholecalciferol (CALCIUM 600 + D PO) Take 600 mg by mouth in the morning and at bedtime.     dorzolamide-timolol (COSOPT) 22.3-6.8 MG/ML ophthalmic solution Place 1 drop into the left eye 2 (two) times daily.     doxazosin (CARDURA) 2 MG tablet TAKE 1 TABLET BY MOUTH  DAILY  PLEASE OBTAIN FUTURE  REFILLS FROM PCP. (Patient taking differently: Take 2 mg by mouth daily.) 90 tablet 0   estradiol (ESTRACE) 0.1 MG/GM vaginal cream Place 1 Applicatorful vaginally 2 (two) times a week. Tuesday and Friday     furosemide (LASIX) 40 MG tablet Take 1 tablet (40 mg total) by mouth daily. 30 tablet 2   IBUPROFEN PO Take 2 tablets by mouth daily as needed (headache).      latanoprost (XALATAN) 0.005 % ophthalmic solution Place 1 drop into the left eye at bedtime.     levothyroxine (SYNTHROID, LEVOTHROID) 88 MCG tablet Take 88 mcg by mouth daily before breakfast.     lisinopril (PRINIVIL,ZESTRIL) 40 MG tablet TAKE 1 TABLET BY MOUTH  DAILY (Patient taking differently: Take 40 mg by mouth daily.) 90 tablet 0   metoprolol succinate (TOPROL-XL) 50 MG 24 hr tablet Take 1 and 1/2 tablets by mouth daily. <PLEASE MAKE APPOINTMENT FOR REFILLS> (Patient taking differently: Take 75 mg by mouth daily.) 45 tablet 0   Multiple Vitamins-Minerals (ICAPS AREDS FORMULA PO) Take 1 tablet by mouth 2 (two) times daily.     oxyCODONE-acetaminophen (PERCOCET) 7.5-325 MG tablet Take 1 tablet by mouth every 6 (six) hours as needed for moderate pain or severe pain.     Polyethyl Glycol-Propyl Glycol (SYSTANE OP) Place 1 drop into both eyes as needed (dryness/itching).     potassium chloride SA (KLOR-CON M) 20 MEQ tablet Take 1 tablet (20 mEq total) by mouth daily. 30 tablet 2   vitamin B-12 (CYANOCOBALAMIN) 500 MCG tablet Take 500 mcg by mouth daily.     Vitamin D, Ergocalciferol, (DRISDOL) 50000 UNITS CAPS capsule Take 50,000 Units by mouth every Sunday.     Facility-Administered Medications Prior to Visit  Medication Dose Route Frequency Provider Last Rate Last Admin   vancomycin (VANCOCIN) 1,000 mg in sodium chloride 0.9 % 500 mL IVPB  1,000 mg Intravenous Once Marchia Bond, MD        Review of Systems  Constitutional:  Positive for malaise/fatigue. Negative for chills and fever.  HENT:  Positive for hearing loss. Negative for congestion.   Respiratory:  Positive for shortness of breath. Negative for cough.   Musculoskeletal:  Negative for falls.  Neurological:  Positive for weakness. Negative for focal weakness.    Objective:  Constitutional:Elderly female in no acute distress. Cardio:Regular rate and rhythm. No murmurs, rubs, or gallops. Pulm:Clear to auscultation bilaterally.  Normal work of breathing on room air. Abdomen:Soft, nontender, nondistended. MSK:0-1+ pitting edema to bilateral LE. Skin:warm and dry. Neuro:Decreased hearing. No focal deficit. Psych:Pleasant mood and affect.  There were no vitals filed for this visit.   on  RA BMI Readings from Last 3 Encounters:  01/23/22 34.40 kg/m  11/08/21 36.13 kg/m  07/16/17 35.82 kg/m   Wt Readings from Last 3 Encounters:  01/23/22 188 lb 0.8 oz (85.3 kg)  11/08/21 185 lb (83.9 kg)  07/16/17 199 lb (90.3 kg)     CBC    Component Value Date/Time   WBC 8.9 01/22/2022 0539   RBC 2.98 (L) 01/22/2022 0539   HGB 9.1 (L) 01/22/2022 0539  HCT 29.0 (L) 01/22/2022 0539   PLT 185 01/22/2022 0539   MCV 97.3 01/22/2022 0539   MCH 30.5 01/22/2022 0539   MCHC 31.4 01/22/2022 0539   RDW 13.0 01/22/2022 0539   LYMPHSABS 2.0 01/21/2022 0530   MONOABS 0.9 01/21/2022 0530   EOSABS 0.1 01/21/2022 0530   BASOSABS 0.1 01/21/2022 0530     Chest Imaging: Chest x-ray 01/2022 Ill-defined hazy left greater than right basilar airspace disease which may be due to atelectasis or pneumonia. Possible tiny pleural effusions.  CTA chest 10/2021 No evidence of pulmonary embolism or other acute cardiopulmonary disease. Marked severity coronary artery calcification. Small hiatal hernia.  Pulmonary Functions Testing Results:     No data to display          Echocardiogram:  01/2022 1. Left ventricular ejection fraction, by estimation, is 55 to 60%. The left ventricle has normal function. The left ventricle has no regional wall motion abnormalities. There is mild concentric left ventricular hypertrophy. Elevated left atrial pressure.   2. Right ventricular systolic function is normal. The right ventricular size is normal. Tricuspid regurgitation signal is inadequate for assessing PA pressure.   3. Left atrial size was mildly dilated.   4. The mitral valve is normal in structure. Moderate mitral valve regurgitation.  No evidence of mitral stenosis. Moderate mitral annular calcification.   5. The aortic valve has an indeterminant number of cusps. Aortic valve regurgitation is trivial. Aortic valve sclerosis is present, with no evidence of aortic valve stenosis.   6. The inferior vena cava is normal in size with greater than 50% respiratory variability, suggesting right atrial pressure of 3 mmHg.     Assessment & Plan:   No diagnosis found.  Discussion: Dyspnea on exertion Patient presents with mildly increased dyspnea on exertion since recent hospitalization for pneumonia in early July. She is doing well on room air at present time with no supplemental oxygen needs. Her greatest barrier to normal activity compared to before her recent illness is fatigue and general weakness. I suspect that with time her dyspnea will resolve to prior baseline. -No need for scheduled follow-up at this time. We will be available in the future as needed.   Current Outpatient Medications:    acetaminophen (TYLENOL) 650 MG CR tablet, Take 1,300 mg by mouth every 6 (six) hours as needed for pain., Disp: , Rfl:    Calcium Carb-Cholecalciferol (CALCIUM 600 + D PO), Take 600 mg by mouth in the morning and at bedtime., Disp: , Rfl:    dorzolamide-timolol (COSOPT) 22.3-6.8 MG/ML ophthalmic solution, Place 1 drop into the left eye 2 (two) times daily., Disp: , Rfl:    doxazosin (CARDURA) 2 MG tablet, TAKE 1 TABLET BY MOUTH  DAILY  PLEASE OBTAIN FUTURE  REFILLS FROM PCP. (Patient taking differently: Take 2 mg by mouth daily.), Disp: 90 tablet, Rfl: 0   estradiol (ESTRACE) 0.1 MG/GM vaginal cream, Place 1 Applicatorful vaginally 2 (two) times a week. Tuesday and Friday, Disp: , Rfl:    furosemide (LASIX) 40 MG tablet, Take 1 tablet (40 mg total) by mouth daily., Disp: 30 tablet, Rfl: 2   IBUPROFEN PO, Take 2 tablets by mouth daily as needed (headache)., Disp: , Rfl:    latanoprost (XALATAN) 0.005 % ophthalmic solution, Place 1 drop into  the left eye at bedtime., Disp: , Rfl:    levothyroxine (SYNTHROID, LEVOTHROID) 88 MCG tablet, Take 88 mcg by mouth daily before breakfast., Disp: , Rfl:    lisinopril (PRINIVIL,ZESTRIL) 40  MG tablet, TAKE 1 TABLET BY MOUTH  DAILY (Patient taking differently: Take 40 mg by mouth daily.), Disp: 90 tablet, Rfl: 0   metoprolol succinate (TOPROL-XL) 50 MG 24 hr tablet, Take 1 and 1/2 tablets by mouth daily. <PLEASE MAKE APPOINTMENT FOR REFILLS> (Patient taking differently: Take 75 mg by mouth daily.), Disp: 45 tablet, Rfl: 0   Multiple Vitamins-Minerals (ICAPS AREDS FORMULA PO), Take 1 tablet by mouth 2 (two) times daily., Disp: , Rfl:    oxyCODONE-acetaminophen (PERCOCET) 7.5-325 MG tablet, Take 1 tablet by mouth every 6 (six) hours as needed for moderate pain or severe pain., Disp: , Rfl:    Polyethyl Glycol-Propyl Glycol (SYSTANE OP), Place 1 drop into both eyes as needed (dryness/itching)., Disp: , Rfl:    potassium chloride SA (KLOR-CON M) 20 MEQ tablet, Take 1 tablet (20 mEq total) by mouth daily., Disp: 30 tablet, Rfl: 2   vitamin B-12 (CYANOCOBALAMIN) 500 MCG tablet, Take 500 mcg by mouth daily., Disp: , Rfl:    Vitamin D, Ergocalciferol, (DRISDOL) 50000 UNITS CAPS capsule, Take 50,000 Units by mouth every Sunday., Disp: , Rfl:  No current facility-administered medications for this visit.  Facility-Administered Medications Ordered in Other Visits:    vancomycin (VANCOCIN) 1,000 mg in sodium chloride 0.9 % 500 mL IVPB, 1,000 mg, Intravenous, Once, Marchia Bond, MD  I spent 35 minutes dedicated to the care of this patient on the date of this encounter to include pre-visit review of records, face-to-face time with the patient discussing conditions above, post visit ordering of testing, clinical documentation with the electronic health record, making appropriate referrals as documented, and communicating necessary findings to members of the patients care team.   Farrel Gordon, DO [facility] 02/13/2022 9:23 AM

## 2022-02-13 NOTE — Patient Instructions (Signed)
Thank you for visiting Dr. Titianna Loomis at [facility]. Today we recommend the following:  Return if symptoms worsen or fail to improve.    Please do your part to reduce the spread of COVID-19.  

## 2022-02-14 DIAGNOSIS — N3946 Mixed incontinence: Secondary | ICD-10-CM | POA: Diagnosis not present

## 2022-02-14 DIAGNOSIS — J189 Pneumonia, unspecified organism: Secondary | ICD-10-CM | POA: Diagnosis not present

## 2022-02-14 DIAGNOSIS — Z466 Encounter for fitting and adjustment of urinary device: Secondary | ICD-10-CM | POA: Diagnosis not present

## 2022-02-14 DIAGNOSIS — H409 Unspecified glaucoma: Secondary | ICD-10-CM | POA: Diagnosis not present

## 2022-02-14 DIAGNOSIS — K5900 Constipation, unspecified: Secondary | ICD-10-CM | POA: Diagnosis not present

## 2022-02-14 DIAGNOSIS — J9601 Acute respiratory failure with hypoxia: Secondary | ICD-10-CM | POA: Diagnosis not present

## 2022-02-18 DIAGNOSIS — H409 Unspecified glaucoma: Secondary | ICD-10-CM | POA: Diagnosis not present

## 2022-02-18 DIAGNOSIS — Z466 Encounter for fitting and adjustment of urinary device: Secondary | ICD-10-CM | POA: Diagnosis not present

## 2022-02-18 DIAGNOSIS — J9601 Acute respiratory failure with hypoxia: Secondary | ICD-10-CM | POA: Diagnosis not present

## 2022-02-18 DIAGNOSIS — K5900 Constipation, unspecified: Secondary | ICD-10-CM | POA: Diagnosis not present

## 2022-02-18 DIAGNOSIS — N3946 Mixed incontinence: Secondary | ICD-10-CM | POA: Diagnosis not present

## 2022-02-18 DIAGNOSIS — J189 Pneumonia, unspecified organism: Secondary | ICD-10-CM | POA: Diagnosis not present

## 2022-02-20 DIAGNOSIS — K5900 Constipation, unspecified: Secondary | ICD-10-CM | POA: Diagnosis not present

## 2022-02-20 DIAGNOSIS — N3946 Mixed incontinence: Secondary | ICD-10-CM | POA: Diagnosis not present

## 2022-02-20 DIAGNOSIS — H409 Unspecified glaucoma: Secondary | ICD-10-CM | POA: Diagnosis not present

## 2022-02-20 DIAGNOSIS — J9601 Acute respiratory failure with hypoxia: Secondary | ICD-10-CM | POA: Diagnosis not present

## 2022-02-20 DIAGNOSIS — J189 Pneumonia, unspecified organism: Secondary | ICD-10-CM | POA: Diagnosis not present

## 2022-02-20 DIAGNOSIS — Z466 Encounter for fitting and adjustment of urinary device: Secondary | ICD-10-CM | POA: Diagnosis not present

## 2022-02-24 DIAGNOSIS — J189 Pneumonia, unspecified organism: Secondary | ICD-10-CM | POA: Diagnosis not present

## 2022-02-24 DIAGNOSIS — Z466 Encounter for fitting and adjustment of urinary device: Secondary | ICD-10-CM | POA: Diagnosis not present

## 2022-02-24 DIAGNOSIS — H409 Unspecified glaucoma: Secondary | ICD-10-CM | POA: Diagnosis not present

## 2022-02-24 DIAGNOSIS — K5900 Constipation, unspecified: Secondary | ICD-10-CM | POA: Diagnosis not present

## 2022-02-24 DIAGNOSIS — N3946 Mixed incontinence: Secondary | ICD-10-CM | POA: Diagnosis not present

## 2022-02-24 DIAGNOSIS — J9601 Acute respiratory failure with hypoxia: Secondary | ICD-10-CM | POA: Diagnosis not present

## 2022-02-25 DIAGNOSIS — H40052 Ocular hypertension, left eye: Secondary | ICD-10-CM | POA: Diagnosis not present

## 2022-02-25 DIAGNOSIS — H40023 Open angle with borderline findings, high risk, bilateral: Secondary | ICD-10-CM | POA: Diagnosis not present

## 2022-03-03 DIAGNOSIS — E860 Dehydration: Secondary | ICD-10-CM | POA: Diagnosis not present

## 2022-03-03 DIAGNOSIS — E039 Hypothyroidism, unspecified: Secondary | ICD-10-CM | POA: Diagnosis not present

## 2022-03-03 DIAGNOSIS — D649 Anemia, unspecified: Secondary | ICD-10-CM | POA: Diagnosis not present

## 2022-03-06 DIAGNOSIS — N3946 Mixed incontinence: Secondary | ICD-10-CM | POA: Diagnosis not present

## 2022-03-06 DIAGNOSIS — H409 Unspecified glaucoma: Secondary | ICD-10-CM | POA: Diagnosis not present

## 2022-03-06 DIAGNOSIS — Z466 Encounter for fitting and adjustment of urinary device: Secondary | ICD-10-CM | POA: Diagnosis not present

## 2022-03-06 DIAGNOSIS — J9601 Acute respiratory failure with hypoxia: Secondary | ICD-10-CM | POA: Diagnosis not present

## 2022-03-06 DIAGNOSIS — J189 Pneumonia, unspecified organism: Secondary | ICD-10-CM | POA: Diagnosis not present

## 2022-03-06 DIAGNOSIS — K5900 Constipation, unspecified: Secondary | ICD-10-CM | POA: Diagnosis not present

## 2022-03-08 ENCOUNTER — Encounter: Payer: Self-pay | Admitting: Emergency Medicine

## 2022-03-08 ENCOUNTER — Ambulatory Visit
Admission: EM | Admit: 2022-03-08 | Discharge: 2022-03-08 | Disposition: A | Discharge status: Home / Self Care | Payer: Medicare Other

## 2022-03-08 DIAGNOSIS — L723 Sebaceous cyst: Secondary | ICD-10-CM

## 2022-03-08 NOTE — ED Provider Notes (Signed)
[facility]    CSN: [id number] Arrival date & time: 03/08/22  1219      History   Chief Complaint Chief Complaint  Patient presents with   Cyst    HPI Carla Taylor is a 88 y.o. female.   Pt complains of a small cyst to the left side of her neck that she noticed about 5 days ago.  She denies pain, drainage, redness, fever, chills.  She has tried nothing for it.  She has an appointment later this month with her dermatologist for an annual check up.     Past Medical History:  Diagnosis Date   Arthritis    Chronic shoulder pain    Hyperlipidemia    Hypertension    Hypothyroid    Macular degeneration    Pancreatitis    Primary localized osteoarthritis of right knee 02/05/2016   Primary osteoarthritis of right shoulder 09/19/2014   Shortness of breath dyspnea    on exertion   Stress bladder incontinence, female     Patient Active Problem List   Diagnosis Date Noted   Acute respiratory failure with hypoxia ([place]) 01/21/2022   PNA (pneumonia) 01/21/2022   Nail, injury by, initial encounter 04/12/2021   Blood clotting disorder ([place]) 01/04/2021   Primary localized osteoarthritis of right knee 02/05/2016   S/P total knee arthroplasty 02/05/2016   Primary osteoarthritis of right shoulder 09/19/2014   Preop cardiovascular exam 05/18/2014   Edema 05/18/2014   Hypokalemia 05/07/2014   Morbid obesity ([place]) 05/06/2014   Osteoarthritis, shoulder 05/06/2014   Hypothyroidism 05/06/2014   Acute pancreatitis 05/05/2014   Essential hypertension 05/05/2014    Past Surgical History:  Procedure Laterality Date   carpal tunnel Bilateral    CATARACT EXTRACTION Bilateral    2005   EYE SURGERY     KNEE ARTHROSCOPY Bilateral    2001   No previous surgeries     TOTAL KNEE ARTHROPLASTY Right 02/05/2016   TOTAL KNEE ARTHROPLASTY Right 02/05/2016   Procedure: RIGHT TOTAL KNEE ARTHROPLASTY;  Surgeon: Marchia Bond, MD;  Location: [place];  Service: Orthopedics;  Laterality:  Right;   TOTAL SHOULDER ARTHROPLASTY Right 09/19/2014   Procedure: RIGHT TOTAL SHOULDER ARTHROPLASTY;  Surgeon: Johnny Bridge, MD;  Location: [place];  Service: Orthopedics;  Laterality: Right;    OB History   No obstetric history on file.      Home Medications    Prior to Admission medications   Medication Sig Start Date End Date Taking? Authorizing Provider  Calcium Carb-Cholecalciferol (CALCIUM 600 + D PO) Take 600 mg by mouth in the morning and at bedtime.   Yes [provider]  dorzolamide-timolol (COSOPT) 22.3-6.8 MG/ML ophthalmic solution Place 1 drop into the left eye 2 (two) times daily. 01/15/22  Yes [provider]  doxazosin (CARDURA) 2 MG tablet TAKE 1 TABLET BY MOUTH  DAILY  PLEASE OBTAIN FUTURE  REFILLS FROM PCP. Patient taking differently: Take 2 mg by mouth daily. 02/19/16  Yes Lelon Perla, MD  estradiol (ESTRACE) 0.1 MG/GM vaginal cream Place 1 Applicatorful vaginally 2 (two) times a week. Tuesday and Friday   Yes [provider]  furosemide (LASIX) 40 MG tablet Take 1 tablet (40 mg total) by mouth daily. 01/23/22 04/23/22 Yes [place] Eric J, DO  latanoprost (XALATAN) 0.005 % ophthalmic solution Place 1 drop into the left eye at bedtime.   Yes [provider]  levothyroxine (SYNTHROID, LEVOTHROID) 88 MCG tablet Take 88 mcg by mouth daily before breakfast.  Yes [provider]  lisinopril (PRINIVIL,ZESTRIL) 40 MG tablet TAKE 1 TABLET BY MOUTH  DAILY Patient taking differently: Take 40 mg by mouth daily. 02/19/16  Yes Lelon Perla, MD  metoprolol succinate (TOPROL-XL) 50 MG 24 hr tablet Take 1 and 1/2 tablets by mouth daily. <PLEASE MAKE APPOINTMENT FOR REFILLS> Patient taking differently: Take 75 mg by mouth daily. 04/02/17  Yes Lelon Perla, MD  Multiple Vitamins-Minerals (ICAPS AREDS FORMULA PO) Take 1 tablet by mouth 2 (two) times daily.   Yes [provider]  potassium chloride SA (KLOR-CON M) 20 MEQ tablet  Take 1 tablet (20 mEq total) by mouth daily. 01/23/22 04/23/22 Yes [place] Eric J, DO  vitamin B-12 (CYANOCOBALAMIN) 500 MCG tablet Take 500 mcg by mouth daily.   Yes [provider]  Vitamin D, Ergocalciferol, (DRISDOL) 50000 UNITS CAPS capsule Take 50,000 Units by mouth every Sunday.   Yes [provider]  acetaminophen (TYLENOL) 650 MG CR tablet Take 1,300 mg by mouth every 6 (six) hours as needed for pain.    [provider]  IBUPROFEN PO Take 2 tablets by mouth daily as needed (headache).    [provider]  oxyCODONE-acetaminophen (PERCOCET) 7.5-325 MG tablet Take 1 tablet by mouth every 6 (six) hours as needed for moderate pain or severe pain. 01/15/22   [provider]  Polyethyl Glycol-Propyl Glycol (SYSTANE OP) Place 1 drop into both eyes as needed (dryness/itching).    [provider]    Family History Family History  Problem Relation Age of Onset   CVA Mother    CVA Father     Social History Social History   Tobacco Use   Smoking status: Never   Smokeless tobacco: Never  Substance Use Topics   Alcohol use: No    Alcohol/week: 0.0 standard drinks of alcohol   Drug use: No     Allergies   Patient has no known allergies.   Review of Systems Review of Systems  Constitutional:  Negative for chills and fever.  HENT:  Negative for ear pain and sore throat.   Eyes:  Negative for pain and visual disturbance.  Respiratory:  Negative for cough and shortness of breath.   Cardiovascular:  Negative for chest pain and palpitations.  Gastrointestinal:  Negative for abdominal pain and vomiting.  Genitourinary:  Negative for dysuria and hematuria.  Musculoskeletal:  Negative for arthralgias and back pain.  Skin:  Negative for color change and rash.       Cyst  Neurological:  Negative for seizures and syncope.  All other systems reviewed and are negative.    Physical Exam Triage Vital Signs ED Triage Vitals  Enc Vitals  Group     BP 03/08/22 1228 (!) 177/68     Pulse Rate 03/08/22 1228 (!) 57     Resp 03/08/22 1228 20     Temp 03/08/22 1225 (!) 97.5 F (36.4 C)     Temp Source 03/08/22 1225 Oral     SpO2 03/08/22 1228 94 %     Weight --      Height --      Head Circumference --      Peak Flow --      Pain Score 03/08/22 1230 0     Pain Loc --      Pain Edu? --      Excl. in [place]? --    No data found.  Updated Vital Signs BP (!) 177/68   Pulse Marland Kitchen)  57   Temp (!) 97.5 F (36.4 C) (Oral)   Resp 20   SpO2 94%   Visual Acuity Right Eye Distance:   Left Eye Distance:   Bilateral Distance:    Right Eye Near:   Left Eye Near:    Bilateral Near:     Physical Exam Vitals and nursing note reviewed.  Constitutional:      General: She is not in acute distress.    Appearance: She is well-developed.  HENT:     Head: Normocephalic and atraumatic.  Eyes:     Conjunctiva/sclera: Conjunctivae normal.  Cardiovascular:     Rate and Rhythm: Normal rate and regular rhythm.     Heart sounds: No murmur heard. Pulmonary:     Effort: Pulmonary effort is normal. No respiratory distress.     Breath sounds: Normal breath sounds.  Abdominal:     Palpations: Abdomen is soft.     Tenderness: There is no abdominal tenderness.  Musculoskeletal:        General: No swelling.     Cervical back: Neck supple.  Skin:    General: Skin is warm and dry.     Capillary Refill: Capillary refill takes less than 2 seconds.     Comments: Small sebaceous cyst to the left side of her neck, mobile, non tender.  No redness or drainage.   Neurological:     Mental Status: She is alert.  Psychiatric:        Mood and Affect: Mood normal.      UC Treatments / Results  Labs (all labs ordered are listed, but only abnormal results are displayed) Labs Reviewed - No data to display  EKG   Radiology No results found.  Procedures Procedures (including critical care time)  Medications Ordered in UC Medications - No  data to display  Initial Impression / Assessment and Plan / UC Course  I have reviewed the triage vital signs and the nursing notes.  Pertinent labs & imaging results that were available during my care of the patient were reviewed by me and considered in my medical decision making (see chart for details).     Small cyst, pt has upcoming dermatology appointment.  No signs of infection, mobile, non tender.  Stable for discharge and follow up with derm.  Final Clinical Impressions(s) / UC Diagnoses   Final diagnoses:  Sebaceous cyst     Discharge Instructions      Keep upcoming appointment with dermatologist Can apply warm compress Can return here for evaluation earlier if needed    ED Prescriptions   None    PDMP not reviewed this encounter.   Ward, Lenise Arena, PA-C 03/08/22 1252

## 2022-03-08 NOTE — Discharge Instructions (Signed)
Keep upcoming appointment with dermatologist Can apply warm compress Can return here for evaluation earlier if needed

## 2022-03-08 NOTE — ED Triage Notes (Signed)
Patient c/o Cyst on Lft side of neck x 5 days.   Patient denies pain. Patient denies drainage.   Patient endorses irritation and itching.   Patient hasn't taken any medications for symptoms.

## 2022-03-10 ENCOUNTER — Encounter (INDEPENDENT_AMBULATORY_CARE_PROVIDER_SITE_OTHER): Payer: Medicare Other | Admitting: Ophthalmology

## 2022-03-10 DIAGNOSIS — H35033 Hypertensive retinopathy, bilateral: Secondary | ICD-10-CM | POA: Diagnosis not present

## 2022-03-10 DIAGNOSIS — H353231 Exudative age-related macular degeneration, bilateral, with active choroidal neovascularization: Secondary | ICD-10-CM

## 2022-03-10 DIAGNOSIS — I10 Essential (primary) hypertension: Secondary | ICD-10-CM | POA: Diagnosis not present

## 2022-03-10 DIAGNOSIS — H43813 Vitreous degeneration, bilateral: Secondary | ICD-10-CM

## 2022-03-11 DIAGNOSIS — E876 Hypokalemia: Secondary | ICD-10-CM | POA: Diagnosis not present

## 2022-03-11 DIAGNOSIS — Z87440 Personal history of urinary (tract) infections: Secondary | ICD-10-CM | POA: Diagnosis not present

## 2022-03-11 DIAGNOSIS — J9601 Acute respiratory failure with hypoxia: Secondary | ICD-10-CM | POA: Diagnosis not present

## 2022-03-11 DIAGNOSIS — R296 Repeated falls: Secondary | ICD-10-CM | POA: Diagnosis not present

## 2022-03-11 DIAGNOSIS — E039 Hypothyroidism, unspecified: Secondary | ICD-10-CM | POA: Diagnosis not present

## 2022-03-11 DIAGNOSIS — K5900 Constipation, unspecified: Secondary | ICD-10-CM | POA: Diagnosis not present

## 2022-03-11 DIAGNOSIS — M549 Dorsalgia, unspecified: Secondary | ICD-10-CM | POA: Diagnosis not present

## 2022-03-11 DIAGNOSIS — E785 Hyperlipidemia, unspecified: Secondary | ICD-10-CM | POA: Diagnosis not present

## 2022-03-11 DIAGNOSIS — I130 Hypertensive heart and chronic kidney disease with heart failure and stage 1 through stage 4 chronic kidney disease, or unspecified chronic kidney disease: Secondary | ICD-10-CM | POA: Diagnosis not present

## 2022-03-11 DIAGNOSIS — Z466 Encounter for fitting and adjustment of urinary device: Secondary | ICD-10-CM | POA: Diagnosis not present

## 2022-03-11 DIAGNOSIS — N3946 Mixed incontinence: Secondary | ICD-10-CM | POA: Diagnosis not present

## 2022-03-11 DIAGNOSIS — N1832 Chronic kidney disease, stage 3b: Secondary | ICD-10-CM | POA: Diagnosis not present

## 2022-03-11 DIAGNOSIS — I5033 Acute on chronic diastolic (congestive) heart failure: Secondary | ICD-10-CM | POA: Diagnosis not present

## 2022-03-11 DIAGNOSIS — Z9181 History of falling: Secondary | ICD-10-CM | POA: Diagnosis not present

## 2022-03-11 DIAGNOSIS — D631 Anemia in chronic kidney disease: Secondary | ICD-10-CM | POA: Diagnosis not present

## 2022-03-11 DIAGNOSIS — J189 Pneumonia, unspecified organism: Secondary | ICD-10-CM | POA: Diagnosis not present

## 2022-03-11 DIAGNOSIS — H409 Unspecified glaucoma: Secondary | ICD-10-CM | POA: Diagnosis not present

## 2022-03-11 DIAGNOSIS — R339 Retention of urine, unspecified: Secondary | ICD-10-CM | POA: Diagnosis not present

## 2022-03-13 DIAGNOSIS — K5900 Constipation, unspecified: Secondary | ICD-10-CM | POA: Diagnosis not present

## 2022-03-13 DIAGNOSIS — J189 Pneumonia, unspecified organism: Secondary | ICD-10-CM | POA: Diagnosis not present

## 2022-03-13 DIAGNOSIS — N3946 Mixed incontinence: Secondary | ICD-10-CM | POA: Diagnosis not present

## 2022-03-13 DIAGNOSIS — H409 Unspecified glaucoma: Secondary | ICD-10-CM | POA: Diagnosis not present

## 2022-03-13 DIAGNOSIS — Z466 Encounter for fitting and adjustment of urinary device: Secondary | ICD-10-CM | POA: Diagnosis not present

## 2022-03-13 DIAGNOSIS — J9601 Acute respiratory failure with hypoxia: Secondary | ICD-10-CM | POA: Diagnosis not present

## 2022-03-17 DIAGNOSIS — H409 Unspecified glaucoma: Secondary | ICD-10-CM | POA: Diagnosis not present

## 2022-03-17 DIAGNOSIS — J189 Pneumonia, unspecified organism: Secondary | ICD-10-CM | POA: Diagnosis not present

## 2022-03-17 DIAGNOSIS — N3946 Mixed incontinence: Secondary | ICD-10-CM | POA: Diagnosis not present

## 2022-03-17 DIAGNOSIS — Z466 Encounter for fitting and adjustment of urinary device: Secondary | ICD-10-CM | POA: Diagnosis not present

## 2022-03-17 DIAGNOSIS — K5900 Constipation, unspecified: Secondary | ICD-10-CM | POA: Diagnosis not present

## 2022-03-17 DIAGNOSIS — J9601 Acute respiratory failure with hypoxia: Secondary | ICD-10-CM | POA: Diagnosis not present

## 2022-03-20 DIAGNOSIS — Z85828 Personal history of other malignant neoplasm of skin: Secondary | ICD-10-CM | POA: Diagnosis not present

## 2022-03-20 DIAGNOSIS — L089 Local infection of the skin and subcutaneous tissue, unspecified: Secondary | ICD-10-CM | POA: Diagnosis not present

## 2022-03-20 DIAGNOSIS — D224 Melanocytic nevi of scalp and neck: Secondary | ICD-10-CM | POA: Diagnosis not present

## 2022-03-20 DIAGNOSIS — D2271 Melanocytic nevi of right lower limb, including hip: Secondary | ICD-10-CM | POA: Diagnosis not present

## 2022-03-20 DIAGNOSIS — L578 Other skin changes due to chronic exposure to nonionizing radiation: Secondary | ICD-10-CM | POA: Diagnosis not present

## 2022-03-20 DIAGNOSIS — D2272 Melanocytic nevi of left lower limb, including hip: Secondary | ICD-10-CM | POA: Diagnosis not present

## 2022-03-20 DIAGNOSIS — L821 Other seborrheic keratosis: Secondary | ICD-10-CM | POA: Diagnosis not present

## 2022-03-20 DIAGNOSIS — L570 Actinic keratosis: Secondary | ICD-10-CM | POA: Diagnosis not present

## 2022-03-20 DIAGNOSIS — Z808 Family history of malignant neoplasm of other organs or systems: Secondary | ICD-10-CM | POA: Diagnosis not present

## 2022-03-20 DIAGNOSIS — D225 Melanocytic nevi of trunk: Secondary | ICD-10-CM | POA: Diagnosis not present

## 2022-03-25 DIAGNOSIS — H409 Unspecified glaucoma: Secondary | ICD-10-CM | POA: Diagnosis not present

## 2022-03-25 DIAGNOSIS — Z466 Encounter for fitting and adjustment of urinary device: Secondary | ICD-10-CM | POA: Diagnosis not present

## 2022-03-25 DIAGNOSIS — N3946 Mixed incontinence: Secondary | ICD-10-CM | POA: Diagnosis not present

## 2022-03-25 DIAGNOSIS — J189 Pneumonia, unspecified organism: Secondary | ICD-10-CM | POA: Diagnosis not present

## 2022-03-25 DIAGNOSIS — J9601 Acute respiratory failure with hypoxia: Secondary | ICD-10-CM | POA: Diagnosis not present

## 2022-03-25 DIAGNOSIS — K5900 Constipation, unspecified: Secondary | ICD-10-CM | POA: Diagnosis not present

## 2022-03-27 DIAGNOSIS — I5032 Chronic diastolic (congestive) heart failure: Secondary | ICD-10-CM | POA: Diagnosis not present

## 2022-03-27 DIAGNOSIS — I10 Essential (primary) hypertension: Secondary | ICD-10-CM | POA: Diagnosis not present

## 2022-03-27 DIAGNOSIS — E039 Hypothyroidism, unspecified: Secondary | ICD-10-CM | POA: Diagnosis not present

## 2022-03-31 DIAGNOSIS — K5900 Constipation, unspecified: Secondary | ICD-10-CM | POA: Diagnosis not present

## 2022-03-31 DIAGNOSIS — H409 Unspecified glaucoma: Secondary | ICD-10-CM | POA: Diagnosis not present

## 2022-03-31 DIAGNOSIS — J9601 Acute respiratory failure with hypoxia: Secondary | ICD-10-CM | POA: Diagnosis not present

## 2022-03-31 DIAGNOSIS — J189 Pneumonia, unspecified organism: Secondary | ICD-10-CM | POA: Diagnosis not present

## 2022-03-31 DIAGNOSIS — N3946 Mixed incontinence: Secondary | ICD-10-CM | POA: Diagnosis not present

## 2022-03-31 DIAGNOSIS — Z466 Encounter for fitting and adjustment of urinary device: Secondary | ICD-10-CM | POA: Diagnosis not present

## 2022-04-08 DIAGNOSIS — K5900 Constipation, unspecified: Secondary | ICD-10-CM | POA: Diagnosis not present

## 2022-04-08 DIAGNOSIS — N3946 Mixed incontinence: Secondary | ICD-10-CM | POA: Diagnosis not present

## 2022-04-08 DIAGNOSIS — J189 Pneumonia, unspecified organism: Secondary | ICD-10-CM | POA: Diagnosis not present

## 2022-04-08 DIAGNOSIS — Z466 Encounter for fitting and adjustment of urinary device: Secondary | ICD-10-CM | POA: Diagnosis not present

## 2022-04-08 DIAGNOSIS — H409 Unspecified glaucoma: Secondary | ICD-10-CM | POA: Diagnosis not present

## 2022-04-08 DIAGNOSIS — J9601 Acute respiratory failure with hypoxia: Secondary | ICD-10-CM | POA: Diagnosis not present

## 2022-04-09 DIAGNOSIS — Z466 Encounter for fitting and adjustment of urinary device: Secondary | ICD-10-CM | POA: Diagnosis not present

## 2022-04-09 DIAGNOSIS — N3946 Mixed incontinence: Secondary | ICD-10-CM | POA: Diagnosis not present

## 2022-04-09 DIAGNOSIS — K5900 Constipation, unspecified: Secondary | ICD-10-CM | POA: Diagnosis not present

## 2022-04-09 DIAGNOSIS — J189 Pneumonia, unspecified organism: Secondary | ICD-10-CM | POA: Diagnosis not present

## 2022-04-09 DIAGNOSIS — J9601 Acute respiratory failure with hypoxia: Secondary | ICD-10-CM | POA: Diagnosis not present

## 2022-04-09 DIAGNOSIS — H409 Unspecified glaucoma: Secondary | ICD-10-CM | POA: Diagnosis not present

## 2022-04-10 DIAGNOSIS — E876 Hypokalemia: Secondary | ICD-10-CM | POA: Diagnosis not present

## 2022-04-10 DIAGNOSIS — D631 Anemia in chronic kidney disease: Secondary | ICD-10-CM | POA: Diagnosis not present

## 2022-04-10 DIAGNOSIS — R296 Repeated falls: Secondary | ICD-10-CM | POA: Diagnosis not present

## 2022-04-10 DIAGNOSIS — E039 Hypothyroidism, unspecified: Secondary | ICD-10-CM | POA: Diagnosis not present

## 2022-04-10 DIAGNOSIS — H409 Unspecified glaucoma: Secondary | ICD-10-CM | POA: Diagnosis not present

## 2022-04-10 DIAGNOSIS — R339 Retention of urine, unspecified: Secondary | ICD-10-CM | POA: Diagnosis not present

## 2022-04-10 DIAGNOSIS — E785 Hyperlipidemia, unspecified: Secondary | ICD-10-CM | POA: Diagnosis not present

## 2022-04-10 DIAGNOSIS — J9601 Acute respiratory failure with hypoxia: Secondary | ICD-10-CM | POA: Diagnosis not present

## 2022-04-10 DIAGNOSIS — M549 Dorsalgia, unspecified: Secondary | ICD-10-CM | POA: Diagnosis not present

## 2022-04-10 DIAGNOSIS — N1832 Chronic kidney disease, stage 3b: Secondary | ICD-10-CM | POA: Diagnosis not present

## 2022-04-10 DIAGNOSIS — I5033 Acute on chronic diastolic (congestive) heart failure: Secondary | ICD-10-CM | POA: Diagnosis not present

## 2022-04-10 DIAGNOSIS — K5900 Constipation, unspecified: Secondary | ICD-10-CM | POA: Diagnosis not present

## 2022-04-10 DIAGNOSIS — I130 Hypertensive heart and chronic kidney disease with heart failure and stage 1 through stage 4 chronic kidney disease, or unspecified chronic kidney disease: Secondary | ICD-10-CM | POA: Diagnosis not present

## 2022-04-10 DIAGNOSIS — N3946 Mixed incontinence: Secondary | ICD-10-CM | POA: Diagnosis not present

## 2022-04-10 DIAGNOSIS — J189 Pneumonia, unspecified organism: Secondary | ICD-10-CM | POA: Diagnosis not present

## 2022-04-10 DIAGNOSIS — Z466 Encounter for fitting and adjustment of urinary device: Secondary | ICD-10-CM | POA: Diagnosis not present

## 2022-04-10 DIAGNOSIS — Z9181 History of falling: Secondary | ICD-10-CM | POA: Diagnosis not present

## 2022-04-10 DIAGNOSIS — Z87440 Personal history of urinary (tract) infections: Secondary | ICD-10-CM | POA: Diagnosis not present

## 2022-04-14 DIAGNOSIS — I10 Essential (primary) hypertension: Secondary | ICD-10-CM | POA: Diagnosis not present

## 2022-04-14 DIAGNOSIS — N1832 Chronic kidney disease, stage 3b: Secondary | ICD-10-CM | POA: Diagnosis not present

## 2022-04-14 DIAGNOSIS — E7800 Pure hypercholesterolemia, unspecified: Secondary | ICD-10-CM | POA: Diagnosis not present

## 2022-04-14 DIAGNOSIS — E039 Hypothyroidism, unspecified: Secondary | ICD-10-CM | POA: Diagnosis not present

## 2022-04-15 DIAGNOSIS — K5900 Constipation, unspecified: Secondary | ICD-10-CM | POA: Diagnosis not present

## 2022-04-15 DIAGNOSIS — H409 Unspecified glaucoma: Secondary | ICD-10-CM | POA: Diagnosis not present

## 2022-04-15 DIAGNOSIS — N3946 Mixed incontinence: Secondary | ICD-10-CM | POA: Diagnosis not present

## 2022-04-15 DIAGNOSIS — J189 Pneumonia, unspecified organism: Secondary | ICD-10-CM | POA: Diagnosis not present

## 2022-04-15 DIAGNOSIS — J9601 Acute respiratory failure with hypoxia: Secondary | ICD-10-CM | POA: Diagnosis not present

## 2022-04-15 DIAGNOSIS — Z466 Encounter for fitting and adjustment of urinary device: Secondary | ICD-10-CM | POA: Diagnosis not present

## 2022-04-16 ENCOUNTER — Encounter: Payer: Self-pay | Admitting: Podiatry

## 2022-04-16 ENCOUNTER — Ambulatory Visit (INDEPENDENT_AMBULATORY_CARE_PROVIDER_SITE_OTHER): Payer: Medicare Other | Admitting: Podiatry

## 2022-04-16 DIAGNOSIS — M79675 Pain in left toe(s): Secondary | ICD-10-CM | POA: Diagnosis not present

## 2022-04-16 DIAGNOSIS — D689 Coagulation defect, unspecified: Secondary | ICD-10-CM

## 2022-04-16 DIAGNOSIS — B351 Tinea unguium: Secondary | ICD-10-CM | POA: Diagnosis not present

## 2022-04-16 DIAGNOSIS — M79674 Pain in right toe(s): Secondary | ICD-10-CM

## 2022-04-16 NOTE — Progress Notes (Signed)
This patient returns to my office for at risk foot care.  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  This patient is unable to cut nails herself since the patient cannot reach her nails.This patient presents for at risk foot care today. Patient was treated during office blackout.  General Appearance  Alert, conversant and in no acute stress.  Vascular  Dorsalis pedis and posterior tibial  pulses are  weakly palpable  bilaterally.  Capillary return is within normal limits  bilaterally. Temperature is within normal limits  bilaterally.  Neurologic  Senn-Weinstein monofilament wire test within normal limits  bilaterally. Muscle power within normal limits bilaterally.  Nails Thick disfigured discolored nails with subungual debris  from hallux to fifth toes bilaterally. No evidence of bacterial infection or drainage bilaterally.    Orthopedic  No limitations of motion  feet .  No crepitus or effusions noted.  No bony pathology or digital deformities noted.  Skin  normotropic skin with no porokeratosis noted bilaterally.  No signs of infections or ulcers noted.     Onychomycosis  Pain in right toes  Pain in left toes    Consent was obtained for treatment procedures.   Mechanical debridement of nails 1-5  bilaterally performed with a nail nipper.  Filed with dremel without incident.    Return office visit   3 months.                  Told patient to return for periodic foot care and evaluation due to potential at risk complications.   Gardiner Barefoot DPM

## 2022-04-22 DIAGNOSIS — H409 Unspecified glaucoma: Secondary | ICD-10-CM | POA: Diagnosis not present

## 2022-04-22 DIAGNOSIS — K5900 Constipation, unspecified: Secondary | ICD-10-CM | POA: Diagnosis not present

## 2022-04-22 DIAGNOSIS — J9601 Acute respiratory failure with hypoxia: Secondary | ICD-10-CM | POA: Diagnosis not present

## 2022-04-22 DIAGNOSIS — Z466 Encounter for fitting and adjustment of urinary device: Secondary | ICD-10-CM | POA: Diagnosis not present

## 2022-04-22 DIAGNOSIS — J189 Pneumonia, unspecified organism: Secondary | ICD-10-CM | POA: Diagnosis not present

## 2022-04-22 DIAGNOSIS — N3946 Mixed incontinence: Secondary | ICD-10-CM | POA: Diagnosis not present

## 2022-04-24 DIAGNOSIS — N3946 Mixed incontinence: Secondary | ICD-10-CM | POA: Diagnosis not present

## 2022-04-24 DIAGNOSIS — J189 Pneumonia, unspecified organism: Secondary | ICD-10-CM | POA: Diagnosis not present

## 2022-04-24 DIAGNOSIS — J9601 Acute respiratory failure with hypoxia: Secondary | ICD-10-CM | POA: Diagnosis not present

## 2022-04-24 DIAGNOSIS — H409 Unspecified glaucoma: Secondary | ICD-10-CM | POA: Diagnosis not present

## 2022-04-24 DIAGNOSIS — Z466 Encounter for fitting and adjustment of urinary device: Secondary | ICD-10-CM | POA: Diagnosis not present

## 2022-04-24 DIAGNOSIS — K5900 Constipation, unspecified: Secondary | ICD-10-CM | POA: Diagnosis not present

## 2022-04-29 DIAGNOSIS — Z466 Encounter for fitting and adjustment of urinary device: Secondary | ICD-10-CM | POA: Diagnosis not present

## 2022-04-29 DIAGNOSIS — N3946 Mixed incontinence: Secondary | ICD-10-CM | POA: Diagnosis not present

## 2022-04-29 DIAGNOSIS — J189 Pneumonia, unspecified organism: Secondary | ICD-10-CM | POA: Diagnosis not present

## 2022-04-29 DIAGNOSIS — H409 Unspecified glaucoma: Secondary | ICD-10-CM | POA: Diagnosis not present

## 2022-04-29 DIAGNOSIS — K5900 Constipation, unspecified: Secondary | ICD-10-CM | POA: Diagnosis not present

## 2022-04-29 DIAGNOSIS — J9601 Acute respiratory failure with hypoxia: Secondary | ICD-10-CM | POA: Diagnosis not present

## 2022-05-05 ENCOUNTER — Encounter (INDEPENDENT_AMBULATORY_CARE_PROVIDER_SITE_OTHER): Payer: Medicare Other | Admitting: Ophthalmology

## 2022-05-05 DIAGNOSIS — H35033 Hypertensive retinopathy, bilateral: Secondary | ICD-10-CM

## 2022-05-05 DIAGNOSIS — I10 Essential (primary) hypertension: Secondary | ICD-10-CM

## 2022-05-05 DIAGNOSIS — H353231 Exudative age-related macular degeneration, bilateral, with active choroidal neovascularization: Secondary | ICD-10-CM | POA: Diagnosis not present

## 2022-05-05 DIAGNOSIS — H43813 Vitreous degeneration, bilateral: Secondary | ICD-10-CM | POA: Diagnosis not present

## 2022-05-06 DIAGNOSIS — Z466 Encounter for fitting and adjustment of urinary device: Secondary | ICD-10-CM | POA: Diagnosis not present

## 2022-05-06 DIAGNOSIS — H409 Unspecified glaucoma: Secondary | ICD-10-CM | POA: Diagnosis not present

## 2022-05-06 DIAGNOSIS — K5900 Constipation, unspecified: Secondary | ICD-10-CM | POA: Diagnosis not present

## 2022-05-06 DIAGNOSIS — J189 Pneumonia, unspecified organism: Secondary | ICD-10-CM | POA: Diagnosis not present

## 2022-05-06 DIAGNOSIS — J9601 Acute respiratory failure with hypoxia: Secondary | ICD-10-CM | POA: Diagnosis not present

## 2022-05-06 DIAGNOSIS — N3946 Mixed incontinence: Secondary | ICD-10-CM | POA: Diagnosis not present

## 2022-05-08 DIAGNOSIS — N3946 Mixed incontinence: Secondary | ICD-10-CM | POA: Diagnosis not present

## 2022-05-08 DIAGNOSIS — K5900 Constipation, unspecified: Secondary | ICD-10-CM | POA: Diagnosis not present

## 2022-05-08 DIAGNOSIS — J9601 Acute respiratory failure with hypoxia: Secondary | ICD-10-CM | POA: Diagnosis not present

## 2022-05-08 DIAGNOSIS — Z466 Encounter for fitting and adjustment of urinary device: Secondary | ICD-10-CM | POA: Diagnosis not present

## 2022-05-08 DIAGNOSIS — J189 Pneumonia, unspecified organism: Secondary | ICD-10-CM | POA: Diagnosis not present

## 2022-05-08 DIAGNOSIS — H409 Unspecified glaucoma: Secondary | ICD-10-CM | POA: Diagnosis not present

## 2022-05-10 DIAGNOSIS — E039 Hypothyroidism, unspecified: Secondary | ICD-10-CM | POA: Diagnosis not present

## 2022-05-10 DIAGNOSIS — D631 Anemia in chronic kidney disease: Secondary | ICD-10-CM | POA: Diagnosis not present

## 2022-05-10 DIAGNOSIS — I5033 Acute on chronic diastolic (congestive) heart failure: Secondary | ICD-10-CM | POA: Diagnosis not present

## 2022-05-10 DIAGNOSIS — Z9181 History of falling: Secondary | ICD-10-CM | POA: Diagnosis not present

## 2022-05-10 DIAGNOSIS — N1832 Chronic kidney disease, stage 3b: Secondary | ICD-10-CM | POA: Diagnosis not present

## 2022-05-10 DIAGNOSIS — J189 Pneumonia, unspecified organism: Secondary | ICD-10-CM | POA: Diagnosis not present

## 2022-05-10 DIAGNOSIS — E785 Hyperlipidemia, unspecified: Secondary | ICD-10-CM | POA: Diagnosis not present

## 2022-05-10 DIAGNOSIS — H409 Unspecified glaucoma: Secondary | ICD-10-CM | POA: Diagnosis not present

## 2022-05-10 DIAGNOSIS — R296 Repeated falls: Secondary | ICD-10-CM | POA: Diagnosis not present

## 2022-05-10 DIAGNOSIS — I130 Hypertensive heart and chronic kidney disease with heart failure and stage 1 through stage 4 chronic kidney disease, or unspecified chronic kidney disease: Secondary | ICD-10-CM | POA: Diagnosis not present

## 2022-05-10 DIAGNOSIS — Z466 Encounter for fitting and adjustment of urinary device: Secondary | ICD-10-CM | POA: Diagnosis not present

## 2022-05-10 DIAGNOSIS — N3946 Mixed incontinence: Secondary | ICD-10-CM | POA: Diagnosis not present

## 2022-05-10 DIAGNOSIS — M549 Dorsalgia, unspecified: Secondary | ICD-10-CM | POA: Diagnosis not present

## 2022-05-10 DIAGNOSIS — Z87440 Personal history of urinary (tract) infections: Secondary | ICD-10-CM | POA: Diagnosis not present

## 2022-05-10 DIAGNOSIS — K5900 Constipation, unspecified: Secondary | ICD-10-CM | POA: Diagnosis not present

## 2022-05-10 DIAGNOSIS — R339 Retention of urine, unspecified: Secondary | ICD-10-CM | POA: Diagnosis not present

## 2022-05-10 DIAGNOSIS — J9601 Acute respiratory failure with hypoxia: Secondary | ICD-10-CM | POA: Diagnosis not present

## 2022-05-10 DIAGNOSIS — R262 Difficulty in walking, not elsewhere classified: Secondary | ICD-10-CM | POA: Diagnosis not present

## 2022-05-10 DIAGNOSIS — E876 Hypokalemia: Secondary | ICD-10-CM | POA: Diagnosis not present

## 2022-05-17 DIAGNOSIS — Z23 Encounter for immunization: Secondary | ICD-10-CM | POA: Diagnosis not present

## 2022-05-20 DIAGNOSIS — J189 Pneumonia, unspecified organism: Secondary | ICD-10-CM | POA: Diagnosis not present

## 2022-05-20 DIAGNOSIS — J9601 Acute respiratory failure with hypoxia: Secondary | ICD-10-CM | POA: Diagnosis not present

## 2022-05-20 DIAGNOSIS — Z466 Encounter for fitting and adjustment of urinary device: Secondary | ICD-10-CM | POA: Diagnosis not present

## 2022-05-20 DIAGNOSIS — N3946 Mixed incontinence: Secondary | ICD-10-CM | POA: Diagnosis not present

## 2022-05-20 DIAGNOSIS — H409 Unspecified glaucoma: Secondary | ICD-10-CM | POA: Diagnosis not present

## 2022-05-20 DIAGNOSIS — K5900 Constipation, unspecified: Secondary | ICD-10-CM | POA: Diagnosis not present

## 2022-05-21 DIAGNOSIS — R351 Nocturia: Secondary | ICD-10-CM | POA: Diagnosis not present

## 2022-05-21 DIAGNOSIS — N3946 Mixed incontinence: Secondary | ICD-10-CM | POA: Diagnosis not present

## 2022-05-30 DIAGNOSIS — N3946 Mixed incontinence: Secondary | ICD-10-CM | POA: Diagnosis not present

## 2022-05-30 DIAGNOSIS — K5900 Constipation, unspecified: Secondary | ICD-10-CM | POA: Diagnosis not present

## 2022-05-30 DIAGNOSIS — Z466 Encounter for fitting and adjustment of urinary device: Secondary | ICD-10-CM | POA: Diagnosis not present

## 2022-05-30 DIAGNOSIS — J189 Pneumonia, unspecified organism: Secondary | ICD-10-CM | POA: Diagnosis not present

## 2022-05-30 DIAGNOSIS — J9601 Acute respiratory failure with hypoxia: Secondary | ICD-10-CM | POA: Diagnosis not present

## 2022-05-30 DIAGNOSIS — H409 Unspecified glaucoma: Secondary | ICD-10-CM | POA: Diagnosis not present

## 2022-06-09 DIAGNOSIS — J9601 Acute respiratory failure with hypoxia: Secondary | ICD-10-CM | POA: Diagnosis not present

## 2022-06-09 DIAGNOSIS — I5033 Acute on chronic diastolic (congestive) heart failure: Secondary | ICD-10-CM | POA: Diagnosis not present

## 2022-06-09 DIAGNOSIS — R262 Difficulty in walking, not elsewhere classified: Secondary | ICD-10-CM | POA: Diagnosis not present

## 2022-06-09 DIAGNOSIS — D631 Anemia in chronic kidney disease: Secondary | ICD-10-CM | POA: Diagnosis not present

## 2022-06-09 DIAGNOSIS — E039 Hypothyroidism, unspecified: Secondary | ICD-10-CM | POA: Diagnosis not present

## 2022-06-09 DIAGNOSIS — N3946 Mixed incontinence: Secondary | ICD-10-CM | POA: Diagnosis not present

## 2022-06-09 DIAGNOSIS — R339 Retention of urine, unspecified: Secondary | ICD-10-CM | POA: Diagnosis not present

## 2022-06-09 DIAGNOSIS — J189 Pneumonia, unspecified organism: Secondary | ICD-10-CM | POA: Diagnosis not present

## 2022-06-09 DIAGNOSIS — E876 Hypokalemia: Secondary | ICD-10-CM | POA: Diagnosis not present

## 2022-06-09 DIAGNOSIS — R296 Repeated falls: Secondary | ICD-10-CM | POA: Diagnosis not present

## 2022-06-09 DIAGNOSIS — Z9181 History of falling: Secondary | ICD-10-CM | POA: Diagnosis not present

## 2022-06-09 DIAGNOSIS — K5900 Constipation, unspecified: Secondary | ICD-10-CM | POA: Diagnosis not present

## 2022-06-09 DIAGNOSIS — E785 Hyperlipidemia, unspecified: Secondary | ICD-10-CM | POA: Diagnosis not present

## 2022-06-09 DIAGNOSIS — H409 Unspecified glaucoma: Secondary | ICD-10-CM | POA: Diagnosis not present

## 2022-06-09 DIAGNOSIS — M549 Dorsalgia, unspecified: Secondary | ICD-10-CM | POA: Diagnosis not present

## 2022-06-09 DIAGNOSIS — N1832 Chronic kidney disease, stage 3b: Secondary | ICD-10-CM | POA: Diagnosis not present

## 2022-06-09 DIAGNOSIS — I130 Hypertensive heart and chronic kidney disease with heart failure and stage 1 through stage 4 chronic kidney disease, or unspecified chronic kidney disease: Secondary | ICD-10-CM | POA: Diagnosis not present

## 2022-06-09 DIAGNOSIS — Z87440 Personal history of urinary (tract) infections: Secondary | ICD-10-CM | POA: Diagnosis not present

## 2022-06-09 DIAGNOSIS — Z466 Encounter for fitting and adjustment of urinary device: Secondary | ICD-10-CM | POA: Diagnosis not present

## 2022-06-11 DIAGNOSIS — N3946 Mixed incontinence: Secondary | ICD-10-CM | POA: Diagnosis not present

## 2022-06-11 DIAGNOSIS — Z466 Encounter for fitting and adjustment of urinary device: Secondary | ICD-10-CM | POA: Diagnosis not present

## 2022-06-11 DIAGNOSIS — H409 Unspecified glaucoma: Secondary | ICD-10-CM | POA: Diagnosis not present

## 2022-06-11 DIAGNOSIS — K5900 Constipation, unspecified: Secondary | ICD-10-CM | POA: Diagnosis not present

## 2022-06-11 DIAGNOSIS — J189 Pneumonia, unspecified organism: Secondary | ICD-10-CM | POA: Diagnosis not present

## 2022-06-11 DIAGNOSIS — J9601 Acute respiratory failure with hypoxia: Secondary | ICD-10-CM | POA: Diagnosis not present

## 2022-06-16 DIAGNOSIS — Z23 Encounter for immunization: Secondary | ICD-10-CM | POA: Diagnosis not present

## 2022-06-19 DIAGNOSIS — N3946 Mixed incontinence: Secondary | ICD-10-CM | POA: Diagnosis not present

## 2022-06-19 DIAGNOSIS — J189 Pneumonia, unspecified organism: Secondary | ICD-10-CM | POA: Diagnosis not present

## 2022-06-19 DIAGNOSIS — Z466 Encounter for fitting and adjustment of urinary device: Secondary | ICD-10-CM | POA: Diagnosis not present

## 2022-06-19 DIAGNOSIS — K5900 Constipation, unspecified: Secondary | ICD-10-CM | POA: Diagnosis not present

## 2022-06-19 DIAGNOSIS — H409 Unspecified glaucoma: Secondary | ICD-10-CM | POA: Diagnosis not present

## 2022-06-19 DIAGNOSIS — J9601 Acute respiratory failure with hypoxia: Secondary | ICD-10-CM | POA: Diagnosis not present

## 2022-06-30 ENCOUNTER — Encounter (INDEPENDENT_AMBULATORY_CARE_PROVIDER_SITE_OTHER): Payer: Medicare Other | Admitting: Ophthalmology

## 2022-06-30 DIAGNOSIS — I10 Essential (primary) hypertension: Secondary | ICD-10-CM

## 2022-06-30 DIAGNOSIS — H353231 Exudative age-related macular degeneration, bilateral, with active choroidal neovascularization: Secondary | ICD-10-CM

## 2022-06-30 DIAGNOSIS — H43813 Vitreous degeneration, bilateral: Secondary | ICD-10-CM | POA: Diagnosis not present

## 2022-06-30 DIAGNOSIS — H35033 Hypertensive retinopathy, bilateral: Secondary | ICD-10-CM | POA: Diagnosis not present

## 2022-07-06 DIAGNOSIS — J9601 Acute respiratory failure with hypoxia: Secondary | ICD-10-CM | POA: Diagnosis not present

## 2022-07-06 DIAGNOSIS — J189 Pneumonia, unspecified organism: Secondary | ICD-10-CM | POA: Diagnosis not present

## 2022-07-06 DIAGNOSIS — N3946 Mixed incontinence: Secondary | ICD-10-CM | POA: Diagnosis not present

## 2022-07-06 DIAGNOSIS — H409 Unspecified glaucoma: Secondary | ICD-10-CM | POA: Diagnosis not present

## 2022-07-06 DIAGNOSIS — Z466 Encounter for fitting and adjustment of urinary device: Secondary | ICD-10-CM | POA: Diagnosis not present

## 2022-07-06 DIAGNOSIS — K5900 Constipation, unspecified: Secondary | ICD-10-CM | POA: Diagnosis not present

## 2022-07-09 DIAGNOSIS — M1990 Unspecified osteoarthritis, unspecified site: Secondary | ICD-10-CM | POA: Diagnosis not present

## 2022-07-09 DIAGNOSIS — J9601 Acute respiratory failure with hypoxia: Secondary | ICD-10-CM | POA: Diagnosis not present

## 2022-07-09 DIAGNOSIS — Z9181 History of falling: Secondary | ICD-10-CM | POA: Diagnosis not present

## 2022-07-09 DIAGNOSIS — E876 Hypokalemia: Secondary | ICD-10-CM | POA: Diagnosis not present

## 2022-07-09 DIAGNOSIS — Z466 Encounter for fitting and adjustment of urinary device: Secondary | ICD-10-CM | POA: Diagnosis not present

## 2022-07-09 DIAGNOSIS — M549 Dorsalgia, unspecified: Secondary | ICD-10-CM | POA: Diagnosis not present

## 2022-07-09 DIAGNOSIS — N1832 Chronic kidney disease, stage 3b: Secondary | ICD-10-CM | POA: Diagnosis not present

## 2022-07-09 DIAGNOSIS — E785 Hyperlipidemia, unspecified: Secondary | ICD-10-CM | POA: Diagnosis not present

## 2022-07-09 DIAGNOSIS — I130 Hypertensive heart and chronic kidney disease with heart failure and stage 1 through stage 4 chronic kidney disease, or unspecified chronic kidney disease: Secondary | ICD-10-CM | POA: Diagnosis not present

## 2022-07-09 DIAGNOSIS — R296 Repeated falls: Secondary | ICD-10-CM | POA: Diagnosis not present

## 2022-07-09 DIAGNOSIS — I5033 Acute on chronic diastolic (congestive) heart failure: Secondary | ICD-10-CM | POA: Diagnosis not present

## 2022-07-09 DIAGNOSIS — R351 Nocturia: Secondary | ICD-10-CM | POA: Diagnosis not present

## 2022-07-09 DIAGNOSIS — H409 Unspecified glaucoma: Secondary | ICD-10-CM | POA: Diagnosis not present

## 2022-07-09 DIAGNOSIS — N3946 Mixed incontinence: Secondary | ICD-10-CM | POA: Diagnosis not present

## 2022-07-09 DIAGNOSIS — Z8701 Personal history of pneumonia (recurrent): Secondary | ICD-10-CM | POA: Diagnosis not present

## 2022-07-09 DIAGNOSIS — R339 Retention of urine, unspecified: Secondary | ICD-10-CM | POA: Diagnosis not present

## 2022-07-09 DIAGNOSIS — K5900 Constipation, unspecified: Secondary | ICD-10-CM | POA: Diagnosis not present

## 2022-07-09 DIAGNOSIS — E039 Hypothyroidism, unspecified: Secondary | ICD-10-CM | POA: Diagnosis not present

## 2022-07-09 DIAGNOSIS — D631 Anemia in chronic kidney disease: Secondary | ICD-10-CM | POA: Diagnosis not present

## 2022-07-09 DIAGNOSIS — Z87440 Personal history of urinary (tract) infections: Secondary | ICD-10-CM | POA: Diagnosis not present

## 2022-07-16 DIAGNOSIS — I130 Hypertensive heart and chronic kidney disease with heart failure and stage 1 through stage 4 chronic kidney disease, or unspecified chronic kidney disease: Secondary | ICD-10-CM | POA: Diagnosis not present

## 2022-07-16 DIAGNOSIS — I5033 Acute on chronic diastolic (congestive) heart failure: Secondary | ICD-10-CM | POA: Diagnosis not present

## 2022-07-16 DIAGNOSIS — D631 Anemia in chronic kidney disease: Secondary | ICD-10-CM | POA: Diagnosis not present

## 2022-07-16 DIAGNOSIS — Z466 Encounter for fitting and adjustment of urinary device: Secondary | ICD-10-CM | POA: Diagnosis not present

## 2022-07-16 DIAGNOSIS — R339 Retention of urine, unspecified: Secondary | ICD-10-CM | POA: Diagnosis not present

## 2022-07-16 DIAGNOSIS — N1832 Chronic kidney disease, stage 3b: Secondary | ICD-10-CM | POA: Diagnosis not present

## 2022-07-22 ENCOUNTER — Encounter: Payer: Self-pay | Admitting: Podiatry

## 2022-07-22 ENCOUNTER — Ambulatory Visit (INDEPENDENT_AMBULATORY_CARE_PROVIDER_SITE_OTHER): Payer: Medicare Other | Admitting: Podiatry

## 2022-07-22 DIAGNOSIS — M79674 Pain in right toe(s): Secondary | ICD-10-CM

## 2022-07-22 DIAGNOSIS — M79675 Pain in left toe(s): Secondary | ICD-10-CM | POA: Diagnosis not present

## 2022-07-22 DIAGNOSIS — B351 Tinea unguium: Secondary | ICD-10-CM

## 2022-07-22 DIAGNOSIS — R519 Headache, unspecified: Secondary | ICD-10-CM | POA: Diagnosis not present

## 2022-07-22 DIAGNOSIS — L989 Disorder of the skin and subcutaneous tissue, unspecified: Secondary | ICD-10-CM | POA: Diagnosis not present

## 2022-07-22 DIAGNOSIS — D689 Coagulation defect, unspecified: Secondary | ICD-10-CM | POA: Diagnosis not present

## 2022-07-22 DIAGNOSIS — R450 Nervousness: Secondary | ICD-10-CM | POA: Diagnosis not present

## 2022-07-22 NOTE — Progress Notes (Signed)
This patient returns to my office for at risk foot care.  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  This patient is unable to cut nails herself since the patient cannot reach her nails.This patient presents for at risk foot care today.   General Appearance  Alert, conversant and in no acute stress.  Vascular  Dorsalis pedis and posterior tibial  pulses are  weakly palpable  bilaterally.  Capillary return is within normal limits  bilaterally. Temperature is within normal limits  bilaterally.  Neurologic  Senn-Weinstein monofilament wire test within normal limits  bilaterally. Muscle power within normal limits bilaterally.  Nails Thick disfigured discolored nails with subungual debris  from hallux to fifth toes bilaterally. No evidence of bacterial infection or drainage bilaterally.    Orthopedic  No limitations of motion  feet .  No crepitus or effusions noted.  No bony pathology or digital deformities noted.  Skin  normotropic skin with no porokeratosis noted bilaterally.  No signs of infections or ulcers noted.     Onychomycosis  Pain in right toes  Pain in left toes    Consent was obtained for treatment procedures.   Mechanical debridement of nails 1-5  bilaterally performed with a nail nipper.  Filed with dremel without incident.    Return office visit   3 months.                  Told patient to return for periodic foot care and evaluation due to potential at risk complications.   Gardiner Barefoot DPM

## 2022-08-01 DIAGNOSIS — D631 Anemia in chronic kidney disease: Secondary | ICD-10-CM | POA: Diagnosis not present

## 2022-08-01 DIAGNOSIS — N1832 Chronic kidney disease, stage 3b: Secondary | ICD-10-CM | POA: Diagnosis not present

## 2022-08-01 DIAGNOSIS — I5033 Acute on chronic diastolic (congestive) heart failure: Secondary | ICD-10-CM | POA: Diagnosis not present

## 2022-08-01 DIAGNOSIS — Z466 Encounter for fitting and adjustment of urinary device: Secondary | ICD-10-CM | POA: Diagnosis not present

## 2022-08-01 DIAGNOSIS — I130 Hypertensive heart and chronic kidney disease with heart failure and stage 1 through stage 4 chronic kidney disease, or unspecified chronic kidney disease: Secondary | ICD-10-CM | POA: Diagnosis not present

## 2022-08-01 DIAGNOSIS — R339 Retention of urine, unspecified: Secondary | ICD-10-CM | POA: Diagnosis not present

## 2022-08-08 DIAGNOSIS — D631 Anemia in chronic kidney disease: Secondary | ICD-10-CM | POA: Diagnosis not present

## 2022-08-08 DIAGNOSIS — E785 Hyperlipidemia, unspecified: Secondary | ICD-10-CM | POA: Diagnosis not present

## 2022-08-08 DIAGNOSIS — M549 Dorsalgia, unspecified: Secondary | ICD-10-CM | POA: Diagnosis not present

## 2022-08-08 DIAGNOSIS — M1990 Unspecified osteoarthritis, unspecified site: Secondary | ICD-10-CM | POA: Diagnosis not present

## 2022-08-08 DIAGNOSIS — I130 Hypertensive heart and chronic kidney disease with heart failure and stage 1 through stage 4 chronic kidney disease, or unspecified chronic kidney disease: Secondary | ICD-10-CM | POA: Diagnosis not present

## 2022-08-08 DIAGNOSIS — J9601 Acute respiratory failure with hypoxia: Secondary | ICD-10-CM | POA: Diagnosis not present

## 2022-08-08 DIAGNOSIS — R351 Nocturia: Secondary | ICD-10-CM | POA: Diagnosis not present

## 2022-08-08 DIAGNOSIS — E039 Hypothyroidism, unspecified: Secondary | ICD-10-CM | POA: Diagnosis not present

## 2022-08-08 DIAGNOSIS — Z87440 Personal history of urinary (tract) infections: Secondary | ICD-10-CM | POA: Diagnosis not present

## 2022-08-08 DIAGNOSIS — H409 Unspecified glaucoma: Secondary | ICD-10-CM | POA: Diagnosis not present

## 2022-08-08 DIAGNOSIS — Z8701 Personal history of pneumonia (recurrent): Secondary | ICD-10-CM | POA: Diagnosis not present

## 2022-08-08 DIAGNOSIS — Z9181 History of falling: Secondary | ICD-10-CM | POA: Diagnosis not present

## 2022-08-08 DIAGNOSIS — I5033 Acute on chronic diastolic (congestive) heart failure: Secondary | ICD-10-CM | POA: Diagnosis not present

## 2022-08-08 DIAGNOSIS — N3946 Mixed incontinence: Secondary | ICD-10-CM | POA: Diagnosis not present

## 2022-08-08 DIAGNOSIS — R296 Repeated falls: Secondary | ICD-10-CM | POA: Diagnosis not present

## 2022-08-08 DIAGNOSIS — N1832 Chronic kidney disease, stage 3b: Secondary | ICD-10-CM | POA: Diagnosis not present

## 2022-08-08 DIAGNOSIS — R339 Retention of urine, unspecified: Secondary | ICD-10-CM | POA: Diagnosis not present

## 2022-08-08 DIAGNOSIS — K5900 Constipation, unspecified: Secondary | ICD-10-CM | POA: Diagnosis not present

## 2022-08-08 DIAGNOSIS — Z466 Encounter for fitting and adjustment of urinary device: Secondary | ICD-10-CM | POA: Diagnosis not present

## 2022-08-08 DIAGNOSIS — E876 Hypokalemia: Secondary | ICD-10-CM | POA: Diagnosis not present

## 2022-08-22 DIAGNOSIS — E7800 Pure hypercholesterolemia, unspecified: Secondary | ICD-10-CM | POA: Diagnosis not present

## 2022-08-22 DIAGNOSIS — N1832 Chronic kidney disease, stage 3b: Secondary | ICD-10-CM | POA: Diagnosis not present

## 2022-08-22 DIAGNOSIS — E039 Hypothyroidism, unspecified: Secondary | ICD-10-CM | POA: Diagnosis not present

## 2022-08-22 DIAGNOSIS — R32 Unspecified urinary incontinence: Secondary | ICD-10-CM | POA: Diagnosis not present

## 2022-08-22 DIAGNOSIS — M1611 Unilateral primary osteoarthritis, right hip: Secondary | ICD-10-CM | POA: Diagnosis not present

## 2022-08-22 DIAGNOSIS — D649 Anemia, unspecified: Secondary | ICD-10-CM | POA: Diagnosis not present

## 2022-08-22 DIAGNOSIS — M1712 Unilateral primary osteoarthritis, left knee: Secondary | ICD-10-CM | POA: Diagnosis not present

## 2022-08-22 DIAGNOSIS — R609 Edema, unspecified: Secondary | ICD-10-CM | POA: Diagnosis not present

## 2022-08-22 DIAGNOSIS — I10 Essential (primary) hypertension: Secondary | ICD-10-CM | POA: Diagnosis not present

## 2022-08-22 DIAGNOSIS — E559 Vitamin D deficiency, unspecified: Secondary | ICD-10-CM | POA: Diagnosis not present

## 2022-08-22 DIAGNOSIS — H3530 Unspecified macular degeneration: Secondary | ICD-10-CM | POA: Diagnosis not present

## 2022-08-25 ENCOUNTER — Encounter (INDEPENDENT_AMBULATORY_CARE_PROVIDER_SITE_OTHER): Payer: Medicare Other | Admitting: Ophthalmology

## 2022-08-25 DIAGNOSIS — H35033 Hypertensive retinopathy, bilateral: Secondary | ICD-10-CM | POA: Diagnosis not present

## 2022-08-25 DIAGNOSIS — H353231 Exudative age-related macular degeneration, bilateral, with active choroidal neovascularization: Secondary | ICD-10-CM

## 2022-08-25 DIAGNOSIS — I10 Essential (primary) hypertension: Secondary | ICD-10-CM

## 2022-08-25 DIAGNOSIS — H43813 Vitreous degeneration, bilateral: Secondary | ICD-10-CM | POA: Diagnosis not present

## 2022-08-31 DIAGNOSIS — D631 Anemia in chronic kidney disease: Secondary | ICD-10-CM | POA: Diagnosis not present

## 2022-08-31 DIAGNOSIS — N1832 Chronic kidney disease, stage 3b: Secondary | ICD-10-CM | POA: Diagnosis not present

## 2022-08-31 DIAGNOSIS — I5033 Acute on chronic diastolic (congestive) heart failure: Secondary | ICD-10-CM | POA: Diagnosis not present

## 2022-08-31 DIAGNOSIS — Z466 Encounter for fitting and adjustment of urinary device: Secondary | ICD-10-CM | POA: Diagnosis not present

## 2022-08-31 DIAGNOSIS — R339 Retention of urine, unspecified: Secondary | ICD-10-CM | POA: Diagnosis not present

## 2022-08-31 DIAGNOSIS — I130 Hypertensive heart and chronic kidney disease with heart failure and stage 1 through stage 4 chronic kidney disease, or unspecified chronic kidney disease: Secondary | ICD-10-CM | POA: Diagnosis not present

## 2022-09-01 DIAGNOSIS — H524 Presbyopia: Secondary | ICD-10-CM | POA: Diagnosis not present

## 2022-09-01 DIAGNOSIS — H353231 Exudative age-related macular degeneration, bilateral, with active choroidal neovascularization: Secondary | ICD-10-CM | POA: Diagnosis not present

## 2022-09-01 DIAGNOSIS — H40052 Ocular hypertension, left eye: Secondary | ICD-10-CM | POA: Diagnosis not present

## 2022-09-01 DIAGNOSIS — H40023 Open angle with borderline findings, high risk, bilateral: Secondary | ICD-10-CM | POA: Diagnosis not present

## 2022-09-01 DIAGNOSIS — H35033 Hypertensive retinopathy, bilateral: Secondary | ICD-10-CM | POA: Diagnosis not present

## 2022-09-02 DIAGNOSIS — I130 Hypertensive heart and chronic kidney disease with heart failure and stage 1 through stage 4 chronic kidney disease, or unspecified chronic kidney disease: Secondary | ICD-10-CM | POA: Diagnosis not present

## 2022-09-02 DIAGNOSIS — Z466 Encounter for fitting and adjustment of urinary device: Secondary | ICD-10-CM | POA: Diagnosis not present

## 2022-09-02 DIAGNOSIS — I5033 Acute on chronic diastolic (congestive) heart failure: Secondary | ICD-10-CM | POA: Diagnosis not present

## 2022-09-02 DIAGNOSIS — D631 Anemia in chronic kidney disease: Secondary | ICD-10-CM | POA: Diagnosis not present

## 2022-09-02 DIAGNOSIS — N1832 Chronic kidney disease, stage 3b: Secondary | ICD-10-CM | POA: Diagnosis not present

## 2022-09-02 DIAGNOSIS — R339 Retention of urine, unspecified: Secondary | ICD-10-CM | POA: Diagnosis not present

## 2022-09-07 DIAGNOSIS — K5900 Constipation, unspecified: Secondary | ICD-10-CM | POA: Diagnosis not present

## 2022-09-07 DIAGNOSIS — E785 Hyperlipidemia, unspecified: Secondary | ICD-10-CM | POA: Diagnosis not present

## 2022-09-07 DIAGNOSIS — R296 Repeated falls: Secondary | ICD-10-CM | POA: Diagnosis not present

## 2022-09-07 DIAGNOSIS — M1990 Unspecified osteoarthritis, unspecified site: Secondary | ICD-10-CM | POA: Diagnosis not present

## 2022-09-07 DIAGNOSIS — Z87440 Personal history of urinary (tract) infections: Secondary | ICD-10-CM | POA: Diagnosis not present

## 2022-09-07 DIAGNOSIS — N1832 Chronic kidney disease, stage 3b: Secondary | ICD-10-CM | POA: Diagnosis not present

## 2022-09-07 DIAGNOSIS — R339 Retention of urine, unspecified: Secondary | ICD-10-CM | POA: Diagnosis not present

## 2022-09-07 DIAGNOSIS — H409 Unspecified glaucoma: Secondary | ICD-10-CM | POA: Diagnosis not present

## 2022-09-07 DIAGNOSIS — E039 Hypothyroidism, unspecified: Secondary | ICD-10-CM | POA: Diagnosis not present

## 2022-09-07 DIAGNOSIS — N3946 Mixed incontinence: Secondary | ICD-10-CM | POA: Diagnosis not present

## 2022-09-07 DIAGNOSIS — Z8701 Personal history of pneumonia (recurrent): Secondary | ICD-10-CM | POA: Diagnosis not present

## 2022-09-07 DIAGNOSIS — R351 Nocturia: Secondary | ICD-10-CM | POA: Diagnosis not present

## 2022-09-07 DIAGNOSIS — I130 Hypertensive heart and chronic kidney disease with heart failure and stage 1 through stage 4 chronic kidney disease, or unspecified chronic kidney disease: Secondary | ICD-10-CM | POA: Diagnosis not present

## 2022-09-07 DIAGNOSIS — I5032 Chronic diastolic (congestive) heart failure: Secondary | ICD-10-CM | POA: Diagnosis not present

## 2022-09-07 DIAGNOSIS — D631 Anemia in chronic kidney disease: Secondary | ICD-10-CM | POA: Diagnosis not present

## 2022-09-07 DIAGNOSIS — Z466 Encounter for fitting and adjustment of urinary device: Secondary | ICD-10-CM | POA: Diagnosis not present

## 2022-09-21 DIAGNOSIS — I5032 Chronic diastolic (congestive) heart failure: Secondary | ICD-10-CM | POA: Diagnosis not present

## 2022-09-21 DIAGNOSIS — D631 Anemia in chronic kidney disease: Secondary | ICD-10-CM | POA: Diagnosis not present

## 2022-09-21 DIAGNOSIS — N1832 Chronic kidney disease, stage 3b: Secondary | ICD-10-CM | POA: Diagnosis not present

## 2022-09-21 DIAGNOSIS — R339 Retention of urine, unspecified: Secondary | ICD-10-CM | POA: Diagnosis not present

## 2022-09-21 DIAGNOSIS — I130 Hypertensive heart and chronic kidney disease with heart failure and stage 1 through stage 4 chronic kidney disease, or unspecified chronic kidney disease: Secondary | ICD-10-CM | POA: Diagnosis not present

## 2022-09-21 DIAGNOSIS — Z466 Encounter for fitting and adjustment of urinary device: Secondary | ICD-10-CM | POA: Diagnosis not present

## 2022-10-05 DIAGNOSIS — I130 Hypertensive heart and chronic kidney disease with heart failure and stage 1 through stage 4 chronic kidney disease, or unspecified chronic kidney disease: Secondary | ICD-10-CM | POA: Diagnosis not present

## 2022-10-05 DIAGNOSIS — Z466 Encounter for fitting and adjustment of urinary device: Secondary | ICD-10-CM | POA: Diagnosis not present

## 2022-10-05 DIAGNOSIS — I5032 Chronic diastolic (congestive) heart failure: Secondary | ICD-10-CM | POA: Diagnosis not present

## 2022-10-05 DIAGNOSIS — R339 Retention of urine, unspecified: Secondary | ICD-10-CM | POA: Diagnosis not present

## 2022-10-05 DIAGNOSIS — N1832 Chronic kidney disease, stage 3b: Secondary | ICD-10-CM | POA: Diagnosis not present

## 2022-10-05 DIAGNOSIS — D631 Anemia in chronic kidney disease: Secondary | ICD-10-CM | POA: Diagnosis not present

## 2022-10-07 DIAGNOSIS — E785 Hyperlipidemia, unspecified: Secondary | ICD-10-CM | POA: Diagnosis not present

## 2022-10-07 DIAGNOSIS — N3946 Mixed incontinence: Secondary | ICD-10-CM | POA: Diagnosis not present

## 2022-10-07 DIAGNOSIS — R351 Nocturia: Secondary | ICD-10-CM | POA: Diagnosis not present

## 2022-10-07 DIAGNOSIS — E039 Hypothyroidism, unspecified: Secondary | ICD-10-CM | POA: Diagnosis not present

## 2022-10-07 DIAGNOSIS — N1832 Chronic kidney disease, stage 3b: Secondary | ICD-10-CM | POA: Diagnosis not present

## 2022-10-07 DIAGNOSIS — Z8701 Personal history of pneumonia (recurrent): Secondary | ICD-10-CM | POA: Diagnosis not present

## 2022-10-07 DIAGNOSIS — D631 Anemia in chronic kidney disease: Secondary | ICD-10-CM | POA: Diagnosis not present

## 2022-10-07 DIAGNOSIS — I130 Hypertensive heart and chronic kidney disease with heart failure and stage 1 through stage 4 chronic kidney disease, or unspecified chronic kidney disease: Secondary | ICD-10-CM | POA: Diagnosis not present

## 2022-10-07 DIAGNOSIS — K5900 Constipation, unspecified: Secondary | ICD-10-CM | POA: Diagnosis not present

## 2022-10-07 DIAGNOSIS — Z87440 Personal history of urinary (tract) infections: Secondary | ICD-10-CM | POA: Diagnosis not present

## 2022-10-07 DIAGNOSIS — M1990 Unspecified osteoarthritis, unspecified site: Secondary | ICD-10-CM | POA: Diagnosis not present

## 2022-10-07 DIAGNOSIS — R339 Retention of urine, unspecified: Secondary | ICD-10-CM | POA: Diagnosis not present

## 2022-10-07 DIAGNOSIS — H409 Unspecified glaucoma: Secondary | ICD-10-CM | POA: Diagnosis not present

## 2022-10-07 DIAGNOSIS — I5032 Chronic diastolic (congestive) heart failure: Secondary | ICD-10-CM | POA: Diagnosis not present

## 2022-10-07 DIAGNOSIS — R296 Repeated falls: Secondary | ICD-10-CM | POA: Diagnosis not present

## 2022-10-07 DIAGNOSIS — Z466 Encounter for fitting and adjustment of urinary device: Secondary | ICD-10-CM | POA: Diagnosis not present

## 2022-10-20 ENCOUNTER — Encounter (INDEPENDENT_AMBULATORY_CARE_PROVIDER_SITE_OTHER): Payer: Medicare Other | Admitting: Ophthalmology

## 2022-10-20 DIAGNOSIS — H43813 Vitreous degeneration, bilateral: Secondary | ICD-10-CM

## 2022-10-20 DIAGNOSIS — H353231 Exudative age-related macular degeneration, bilateral, with active choroidal neovascularization: Secondary | ICD-10-CM

## 2022-10-20 DIAGNOSIS — I10 Essential (primary) hypertension: Secondary | ICD-10-CM | POA: Diagnosis not present

## 2022-10-20 DIAGNOSIS — H35033 Hypertensive retinopathy, bilateral: Secondary | ICD-10-CM | POA: Diagnosis not present

## 2022-10-21 ENCOUNTER — Encounter: Payer: Self-pay | Admitting: Podiatry

## 2022-10-21 ENCOUNTER — Ambulatory Visit (INDEPENDENT_AMBULATORY_CARE_PROVIDER_SITE_OTHER): Payer: Medicare Other | Admitting: Podiatry

## 2022-10-21 DIAGNOSIS — M79675 Pain in left toe(s): Secondary | ICD-10-CM

## 2022-10-21 DIAGNOSIS — M79674 Pain in right toe(s): Secondary | ICD-10-CM | POA: Diagnosis not present

## 2022-10-21 DIAGNOSIS — B351 Tinea unguium: Secondary | ICD-10-CM | POA: Diagnosis not present

## 2022-10-21 DIAGNOSIS — D689 Coagulation defect, unspecified: Secondary | ICD-10-CM

## 2022-10-21 NOTE — Progress Notes (Signed)
This patient returns to my office for at risk foot care.  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  This patient is unable to cut nails herself since the patient cannot reach her nails.This patient presents for at risk foot care today.   General Appearance  Alert, conversant and in no acute stress.  Vascular  Dorsalis pedis and posterior tibial  pulses are  weakly palpable  bilaterally.  Capillary return is within normal limits  bilaterally. Temperature is within normal limits  bilaterally.  Neurologic  Senn-Weinstein monofilament wire test within normal limits  bilaterally. Muscle power within normal limits bilaterally.  Nails Thick disfigured discolored nails with subungual debris  from hallux to fifth toes bilaterally. No evidence of bacterial infection or drainage bilaterally.    Orthopedic  No limitations of motion  feet .  No crepitus or effusions noted.  No bony pathology or digital deformities noted.  Skin  normotropic skin with no porokeratosis noted bilaterally.  No signs of infections or ulcers noted.     Onychomycosis  Pain in right toes  Pain in left toes    Consent was obtained for treatment procedures.   Mechanical debridement of nails 1-5  bilaterally performed with a nail nipper.  Filed with dremel without incident.    Return office visit   3 months.                  Told patient to return for periodic foot care and evaluation due to potential at risk complications.   Viola Kinnick DPM   

## 2022-10-22 DIAGNOSIS — I130 Hypertensive heart and chronic kidney disease with heart failure and stage 1 through stage 4 chronic kidney disease, or unspecified chronic kidney disease: Secondary | ICD-10-CM | POA: Diagnosis not present

## 2022-10-22 DIAGNOSIS — R339 Retention of urine, unspecified: Secondary | ICD-10-CM | POA: Diagnosis not present

## 2022-10-22 DIAGNOSIS — D631 Anemia in chronic kidney disease: Secondary | ICD-10-CM | POA: Diagnosis not present

## 2022-10-22 DIAGNOSIS — Z466 Encounter for fitting and adjustment of urinary device: Secondary | ICD-10-CM | POA: Diagnosis not present

## 2022-10-22 DIAGNOSIS — N1832 Chronic kidney disease, stage 3b: Secondary | ICD-10-CM | POA: Diagnosis not present

## 2022-10-22 DIAGNOSIS — I5032 Chronic diastolic (congestive) heart failure: Secondary | ICD-10-CM | POA: Diagnosis not present

## 2022-11-03 DIAGNOSIS — R339 Retention of urine, unspecified: Secondary | ICD-10-CM | POA: Diagnosis not present

## 2022-11-03 DIAGNOSIS — Z466 Encounter for fitting and adjustment of urinary device: Secondary | ICD-10-CM | POA: Diagnosis not present

## 2022-11-03 DIAGNOSIS — I130 Hypertensive heart and chronic kidney disease with heart failure and stage 1 through stage 4 chronic kidney disease, or unspecified chronic kidney disease: Secondary | ICD-10-CM | POA: Diagnosis not present

## 2022-11-03 DIAGNOSIS — N1832 Chronic kidney disease, stage 3b: Secondary | ICD-10-CM | POA: Diagnosis not present

## 2022-11-03 DIAGNOSIS — D631 Anemia in chronic kidney disease: Secondary | ICD-10-CM | POA: Diagnosis not present

## 2022-11-03 DIAGNOSIS — I5032 Chronic diastolic (congestive) heart failure: Secondary | ICD-10-CM | POA: Diagnosis not present

## 2022-11-06 DIAGNOSIS — N3946 Mixed incontinence: Secondary | ICD-10-CM | POA: Diagnosis not present

## 2022-11-06 DIAGNOSIS — Z466 Encounter for fitting and adjustment of urinary device: Secondary | ICD-10-CM | POA: Diagnosis not present

## 2022-11-06 DIAGNOSIS — H409 Unspecified glaucoma: Secondary | ICD-10-CM | POA: Diagnosis not present

## 2022-11-06 DIAGNOSIS — E785 Hyperlipidemia, unspecified: Secondary | ICD-10-CM | POA: Diagnosis not present

## 2022-11-06 DIAGNOSIS — K5900 Constipation, unspecified: Secondary | ICD-10-CM | POA: Diagnosis not present

## 2022-11-06 DIAGNOSIS — D631 Anemia in chronic kidney disease: Secondary | ICD-10-CM | POA: Diagnosis not present

## 2022-11-06 DIAGNOSIS — R296 Repeated falls: Secondary | ICD-10-CM | POA: Diagnosis not present

## 2022-11-06 DIAGNOSIS — Z8701 Personal history of pneumonia (recurrent): Secondary | ICD-10-CM | POA: Diagnosis not present

## 2022-11-06 DIAGNOSIS — N1832 Chronic kidney disease, stage 3b: Secondary | ICD-10-CM | POA: Diagnosis not present

## 2022-11-06 DIAGNOSIS — Z87440 Personal history of urinary (tract) infections: Secondary | ICD-10-CM | POA: Diagnosis not present

## 2022-11-06 DIAGNOSIS — M1990 Unspecified osteoarthritis, unspecified site: Secondary | ICD-10-CM | POA: Diagnosis not present

## 2022-11-06 DIAGNOSIS — R351 Nocturia: Secondary | ICD-10-CM | POA: Diagnosis not present

## 2022-11-06 DIAGNOSIS — I130 Hypertensive heart and chronic kidney disease with heart failure and stage 1 through stage 4 chronic kidney disease, or unspecified chronic kidney disease: Secondary | ICD-10-CM | POA: Diagnosis not present

## 2022-11-06 DIAGNOSIS — I5032 Chronic diastolic (congestive) heart failure: Secondary | ICD-10-CM | POA: Diagnosis not present

## 2022-11-06 DIAGNOSIS — R339 Retention of urine, unspecified: Secondary | ICD-10-CM | POA: Diagnosis not present

## 2022-11-06 DIAGNOSIS — E039 Hypothyroidism, unspecified: Secondary | ICD-10-CM | POA: Diagnosis not present

## 2022-11-24 DIAGNOSIS — D631 Anemia in chronic kidney disease: Secondary | ICD-10-CM | POA: Diagnosis not present

## 2022-11-24 DIAGNOSIS — Z466 Encounter for fitting and adjustment of urinary device: Secondary | ICD-10-CM | POA: Diagnosis not present

## 2022-11-24 DIAGNOSIS — I130 Hypertensive heart and chronic kidney disease with heart failure and stage 1 through stage 4 chronic kidney disease, or unspecified chronic kidney disease: Secondary | ICD-10-CM | POA: Diagnosis not present

## 2022-11-24 DIAGNOSIS — N1832 Chronic kidney disease, stage 3b: Secondary | ICD-10-CM | POA: Diagnosis not present

## 2022-11-24 DIAGNOSIS — R339 Retention of urine, unspecified: Secondary | ICD-10-CM | POA: Diagnosis not present

## 2022-11-24 DIAGNOSIS — I5032 Chronic diastolic (congestive) heart failure: Secondary | ICD-10-CM | POA: Diagnosis not present

## 2022-12-01 DIAGNOSIS — Z01419 Encounter for gynecological examination (general) (routine) without abnormal findings: Secondary | ICD-10-CM | POA: Diagnosis not present

## 2022-12-06 DIAGNOSIS — N1832 Chronic kidney disease, stage 3b: Secondary | ICD-10-CM | POA: Diagnosis not present

## 2022-12-06 DIAGNOSIS — R351 Nocturia: Secondary | ICD-10-CM | POA: Diagnosis not present

## 2022-12-06 DIAGNOSIS — N3946 Mixed incontinence: Secondary | ICD-10-CM | POA: Diagnosis not present

## 2022-12-06 DIAGNOSIS — Z466 Encounter for fitting and adjustment of urinary device: Secondary | ICD-10-CM | POA: Diagnosis not present

## 2022-12-06 DIAGNOSIS — D631 Anemia in chronic kidney disease: Secondary | ICD-10-CM | POA: Diagnosis not present

## 2022-12-06 DIAGNOSIS — I5032 Chronic diastolic (congestive) heart failure: Secondary | ICD-10-CM | POA: Diagnosis not present

## 2022-12-06 DIAGNOSIS — I130 Hypertensive heart and chronic kidney disease with heart failure and stage 1 through stage 4 chronic kidney disease, or unspecified chronic kidney disease: Secondary | ICD-10-CM | POA: Diagnosis not present

## 2022-12-06 DIAGNOSIS — M1990 Unspecified osteoarthritis, unspecified site: Secondary | ICD-10-CM | POA: Diagnosis not present

## 2022-12-06 DIAGNOSIS — R296 Repeated falls: Secondary | ICD-10-CM | POA: Diagnosis not present

## 2022-12-06 DIAGNOSIS — H409 Unspecified glaucoma: Secondary | ICD-10-CM | POA: Diagnosis not present

## 2022-12-06 DIAGNOSIS — R339 Retention of urine, unspecified: Secondary | ICD-10-CM | POA: Diagnosis not present

## 2022-12-06 DIAGNOSIS — K5900 Constipation, unspecified: Secondary | ICD-10-CM | POA: Diagnosis not present

## 2022-12-06 DIAGNOSIS — Z87440 Personal history of urinary (tract) infections: Secondary | ICD-10-CM | POA: Diagnosis not present

## 2022-12-06 DIAGNOSIS — E039 Hypothyroidism, unspecified: Secondary | ICD-10-CM | POA: Diagnosis not present

## 2022-12-06 DIAGNOSIS — E785 Hyperlipidemia, unspecified: Secondary | ICD-10-CM | POA: Diagnosis not present

## 2022-12-06 DIAGNOSIS — Z8701 Personal history of pneumonia (recurrent): Secondary | ICD-10-CM | POA: Diagnosis not present

## 2022-12-07 ENCOUNTER — Encounter (HOSPITAL_COMMUNITY): Payer: Self-pay

## 2022-12-07 ENCOUNTER — Emergency Department (HOSPITAL_COMMUNITY): Payer: Medicare Other

## 2022-12-07 ENCOUNTER — Other Ambulatory Visit: Payer: Self-pay

## 2022-12-07 ENCOUNTER — Emergency Department (HOSPITAL_COMMUNITY)
Admission: EM | Admit: 2022-12-07 | Discharge: 2022-12-07 | Disposition: A | Discharge status: Home / Self Care | Payer: Medicare Other | Attending: Emergency Medicine | Admitting: Emergency Medicine

## 2022-12-07 DIAGNOSIS — S32049A Unspecified fracture of fourth lumbar vertebra, initial encounter for closed fracture: Secondary | ICD-10-CM | POA: Diagnosis not present

## 2022-12-07 DIAGNOSIS — M25552 Pain in left hip: Secondary | ICD-10-CM | POA: Diagnosis not present

## 2022-12-07 DIAGNOSIS — M5432 Sciatica, left side: Secondary | ICD-10-CM

## 2022-12-07 DIAGNOSIS — N1832 Chronic kidney disease, stage 3b: Secondary | ICD-10-CM | POA: Diagnosis not present

## 2022-12-07 DIAGNOSIS — Y92008 Other place in unspecified non-institutional (private) residence as the place of occurrence of the external cause: Secondary | ICD-10-CM | POA: Diagnosis not present

## 2022-12-07 DIAGNOSIS — S32040A Wedge compression fracture of fourth lumbar vertebra, initial encounter for closed fracture: Secondary | ICD-10-CM

## 2022-12-07 DIAGNOSIS — I129 Hypertensive chronic kidney disease with stage 1 through stage 4 chronic kidney disease, or unspecified chronic kidney disease: Secondary | ICD-10-CM | POA: Diagnosis not present

## 2022-12-07 DIAGNOSIS — Z79899 Other long term (current) drug therapy: Secondary | ICD-10-CM | POA: Insufficient documentation

## 2022-12-07 DIAGNOSIS — W1839XA Other fall on same level, initial encounter: Secondary | ICD-10-CM | POA: Diagnosis not present

## 2022-12-07 DIAGNOSIS — E039 Hypothyroidism, unspecified: Secondary | ICD-10-CM | POA: Insufficient documentation

## 2022-12-07 DIAGNOSIS — M549 Dorsalgia, unspecified: Secondary | ICD-10-CM | POA: Diagnosis not present

## 2022-12-07 DIAGNOSIS — S3992XA Unspecified injury of lower back, initial encounter: Secondary | ICD-10-CM | POA: Diagnosis present

## 2022-12-07 DIAGNOSIS — M5442 Lumbago with sciatica, left side: Secondary | ICD-10-CM | POA: Diagnosis not present

## 2022-12-07 MED ORDER — LIDOCAINE 5 % EX PTCH: 1.0000 | MEDICATED_PATCH | CUTANEOUS | 0 refills | Status: DC

## 2022-12-07 MED ORDER — OXYCODONE-ACETAMINOPHEN 5-325 MG PO TABS: 1.0000 | ORAL_TABLET | Freq: Four times a day (QID) | ORAL | 0 refills | Status: DC | PRN

## 2022-12-07 MED ORDER — MORPHINE SULFATE (PF) 4 MG/ML IV SOLN
4.0000 mg | Freq: Once | INTRAVENOUS | Status: AC
  Administered 2022-12-07: 4 mg via SUBCUTANEOUS
  Filled 2022-12-07: qty 1

## 2022-12-07 MED ORDER — OXYCODONE-ACETAMINOPHEN 5-325 MG PO TABS
1.0000 | ORAL_TABLET | Freq: Once | ORAL | Status: AC
  Administered 2022-12-07: 1 via ORAL
  Filled 2022-12-07: qty 1

## 2022-12-07 MED ORDER — LIDOCAINE 5 % EX PTCH
1.0000 | MEDICATED_PATCH | CUTANEOUS | Status: DC
Start: 2022-12-07 — End: 2022-12-08
  Administered 2022-12-07: 1 via TRANSDERMAL
  Filled 2022-12-07: qty 1

## 2022-12-07 MED ORDER — ONDANSETRON 4 MG PO TBDP
4.0000 mg | ORAL_TABLET | Freq: Once | ORAL | Status: AC
  Administered 2022-12-07: 4 mg via ORAL
  Filled 2022-12-07: qty 1

## 2022-12-07 NOTE — ED Notes (Signed)
Pt walked well out into hall way. Pt did well pt stated that the pain was coming back when walking back to room however the pain was her leg and her back felt better.

## 2022-12-07 NOTE — ED Provider Notes (Signed)
[place] EMERGENCY DEPARTMENT AT [facility] Provider Note   CSN: [id number] Arrival date & time: 12/07/22  1517     History  Chief Complaint  Patient presents with   Hip Pain   Leg Pain    Carla Taylor is a 89 y.o. female. With pmh essential hypertension, hypothyroidism, hyperlipidemia, CKD stage IIIb presenting with left leg and low back pain after mechanical fall sustained 1 week ago.  Patient lives with her son who also witnessed fall.  She should be walking with her walker but was not using it at the time.  She was on the porch and turned around from getting up from a chair and fell onto her buttocks 1 week ago.  Since then she has been having pain in her lower back radiating down her left leg and having difficulty and pain with changing position or ambulating.  However she is still able to ambulate.  She denies any numbness, tingling or weakness in her lower legs.  Denies any saddle anesthesia.  Denies any loss of control of her bowels.  She has an indwelling Foley catheter.  She has had no fevers or chills.  She has been taking Tylenol and ibuprofen without relief.  She is not on any anticoagulation.  She denied any head injury or loss of consciousness.  She denied any preceding symptoms leading to fall.   Hip Pain  Leg Pain      Home Medications Prior to Admission medications   Medication Sig Start Date End Date Taking? Authorizing Provider  lidocaine (LIDODERM) 5 % Place 1 patch onto the skin daily. Remove & Discard patch within 12 hours or as directed by MD 12/07/22  Yes Mardene Sayer, MD  oxyCODONE-acetaminophen (PERCOCET/ROXICET) 5-325 MG tablet Take 1 tablet by mouth every 6 (six) hours as needed for up to 10 doses for severe pain. 12/07/22  Yes Mardene Sayer, MD  acetaminophen (TYLENOL) 650 MG CR tablet Take 1,300 mg by mouth every 6 (six) hours as needed for pain.    [provider]  Calcium Carb-Cholecalciferol (CALCIUM 600 + D PO) Take  600 mg by mouth in the morning and at bedtime.    [provider]  dorzolamide-timolol (COSOPT) 22.3-6.8 MG/ML ophthalmic solution Place 1 drop into the left eye 2 (two) times daily. 01/15/22   [provider]  doxazosin (CARDURA) 2 MG tablet TAKE 1 TABLET BY MOUTH  DAILY  PLEASE OBTAIN FUTURE  REFILLS FROM PCP. Patient taking differently: Take 2 mg by mouth daily. 02/19/16   Lewayne Bunting, MD  estradiol (ESTRACE) 0.1 MG/GM vaginal cream Place 1 Applicatorful vaginally 2 (two) times a week. Tuesday and Friday    [provider]  furosemide (LASIX) 40 MG tablet Take 1 tablet (40 mg total) by mouth daily. 01/23/22 04/23/22  [place], Eric J, DO  IBUPROFEN PO Take 2 tablets by mouth daily as needed (headache).    [provider]  latanoprost (XALATAN) 0.005 % ophthalmic solution Place 1 drop into the left eye at bedtime.    [provider]  levothyroxine (SYNTHROID, LEVOTHROID) 88 MCG tablet Take 88 mcg by mouth daily before breakfast.    [provider]  lisinopril (PRINIVIL,ZESTRIL) 40 MG tablet TAKE 1 TABLET BY MOUTH  DAILY Patient taking differently: Take 40 mg by mouth daily. 02/19/16   Lewayne Bunting, MD  metoprolol succinate (TOPROL-XL) 50 MG 24 hr tablet Take 1 and 1/2 tablets by mouth daily. <PLEASE MAKE APPOINTMENT FOR  REFILLS> Patient taking differently: Take 75 mg by mouth daily. 04/02/17   Lewayne Bunting, MD  Multiple Vitamins-Minerals (ICAPS AREDS FORMULA PO) Take 1 tablet by mouth 2 (two) times daily.    [provider]  oxyCODONE-acetaminophen (PERCOCET) 7.5-325 MG tablet Take 1 tablet by mouth every 6 (six) hours as needed for moderate pain or severe pain. 01/15/22   [provider]  Polyethyl Glycol-Propyl Glycol (SYSTANE OP) Place 1 drop into both eyes as needed (dryness/itching).    [provider]  potassium chloride SA (KLOR-CON M) 20 MEQ tablet Take 1 tablet (20 mEq total) by mouth daily. 01/23/22  04/23/22  [place], Alvira Philips, DO  vitamin B-12 (CYANOCOBALAMIN) 500 MCG tablet Take 500 mcg by mouth daily.    [provider]  Vitamin D, Ergocalciferol, (DRISDOL) 50000 UNITS CAPS capsule Take 50,000 Units by mouth every Sunday.    [provider]      Allergies    Patient has no known allergies.    Review of Systems   Review of Systems  Physical Exam Updated Vital Signs BP (!) 154/74   Pulse 71   Temp 97.6 F (36.4 C)   Resp 18   Ht 5\' 2"  (1.575 m)   Wt 78 kg   SpO2 99%   BMI 31.45 kg/m  Physical Exam Constitutional: Alert and oriented. Well appearing and in no distress. Eyes: Conjunctivae are normal. ENT      Head: Normocephalic and atraumatic.      Neck: No midline ttp, stepoff or deformity. Cardiovascular: S1, S2,  equal palpable DP pulses/PT pulses regular rate Respiratory: Normal respiratory effort. O2 sat 98 on RA Gastrointestinal: Soft and nontender. There is no CVA tenderness. Musculoskeletal: Normal range of motion in all extremities. TTP of left lower midline lumbar spine and left sided paraspinal region.      Right lower leg: No tenderness or edema.      Left lower leg: No tenderness or edema. + SLT  Neurologic: Normal speech and language. 5/5 b/l hip flexor, knee extensor, foot dorsiflexion. Sensation grossly intact in b/l LE. Steady gait with assistance. No gross focal neurologic deficits are appreciated. Skin: Skin is warm, dry and intact. No rash noted. Psychiatric: Mood and affect are normal. Speech and behavior are normal.  ED Results / Procedures / Treatments   Labs (all labs ordered are listed, but only abnormal results are displayed) Labs Reviewed - No data to display  EKG None  Radiology DG Hip Unilat W or Wo Pelvis 2-3 Views Left  Result Date: 12/07/2022 CLINICAL DATA:  Fall from standing one week ago with 1 day history of left hip pain EXAM: DG HIP (WITH OR WITHOUT PELVIS) 3V LEFT COMPARISON:  Left hip and pelvis radiograph  dated 09/08/2003 FINDINGS: There is no evidence of hip fracture or dislocation. Symmetric severe degenerative changes of the bilateral hips. Urinary catheter projects over the midline pelvis. IMPRESSION: 1. No acute fracture or dislocation. 2. Symmetric severe degenerative changes of the bilateral hips. Electronically Signed   By: Agustin Cree M.D.   On: 12/07/2022 16:23   DG Lumbar Spine Complete  Result Date: 12/07/2022 CLINICAL DATA:  Back pain with no known injury. EXAM: LUMBAR SPINE - COMPLETE 5 VIEW COMPARISON:  CT abdomen and pelvis dated 07/19/2016 FINDINGS: Age indeterminate superior endplate compression of L4, new from 07/19/2016. Multilevel degenerative changes of the lumbar spine. Grade 1 anterolisthesis at L3-4, unchanged. IMPRESSION: 1. Age indeterminate superior endplate compression of L4, new from 07/19/2016.  2. Multilevel degenerative changes of the lumbar spine with unchanged grade 1 anterolisthesis at L3-4. Electronically Signed   By: Agustin Cree M.D.   On: 12/07/2022 16:06    Procedures Procedures    Medications Ordered in ED Medications  oxyCODONE-acetaminophen (PERCOCET/ROXICET) 5-325 MG per tablet 1 tablet (1 tablet Oral Given 12/07/22 1655)  morphine (PF) 4 MG/ML injection 4 mg (4 mg Subcutaneous Given 12/07/22 1825)  ondansetron (ZOFRAN-ODT) disintegrating tablet 4 mg (4 mg Oral Given 12/07/22 1825)    ED Course/ Medical Decision Making/ A&P Clinical Course as of 12/08/22 0924  Sun Dec 07, 2022  1802 Attempted ambulation and standing patient up after Percocet and lidocaine patches she was in too much pain to transfer.  Will attempt subcutaneous morphine and reassess. [VB]  [id number] Patient felt much better after morphine shot.  She was able to stand up and ambulate with assistance.  I have consulted TOC to help with home health and assistance.  I have sent for pain control medications to pharmacy.  Advise close follow-up with PCP.  Also provided neurosurgery/spine surgery  information for outpatient follow-up and return precautions. [VB]    Clinical Course User Index [VB] Mardene Sayer, MD                             Medical Decision Making SOPHIAMARIE KNAUP is a 89 y.o. female. With pmh essential hypertension, hypothyroidism, hyperlipidemia, CKD stage IIIb presenting with left leg and low back pain after mechanical fall sustained 1 week ago.  Patient lives with her son who also witnessed fall.  There was no syncope or head injury no indication for head imaging.  Patient also not on anticoagulation.  This was a mechanical fall.  Patient's pain localizes in her lower lumbar spine as well as positive straight leg test on the left indicative of sciatica on the left.  She has no concerning deficits on exam suggestive of cauda equina or spinal cord etiology of symptoms.  She has no abdominal tenderness on exam or palpable masses concerning for AAA and no pulse deficits concerning for dissection.  She has no urinary symptoms, no concern for UTI and no CVAT, no concern for pyelonephritis or nephrolithiasis.  Plain films of x-ray and hips obtained.  No acute fracture or dislocation in hips however notable age-indeterminate superior endplate compression fracture of L4 noted on L-spine imaging as well as multilevel degenerative changes of lumbar spine.  Patient's pain is likely related to L4 compression fracture as well as sciatica on the left.  She was given pain control in ED and was able to ambulate with assistance.  She would like to go home with pain control.  Risks outweigh benefits for patient in terms of ambulatory status and pain control for surgical intervention or brace.  However I have consulted TOC to help with patient obtain home health and outpatient /PT OT.I have sent for pain control medications to pharmacy.  Advise close follow-up with PCP.  Also provided neurosurgery/spine surgery information for outpatient follow-up and return precautions.  Patient and  patient's son in agreement with plan.  [VB]  Amount and/or Complexity of Data Reviewed Radiology: ordered.  Risk Prescription drug management.     Final Clinical Impression(s) / ED Diagnoses Final diagnoses:  Closed compression fracture of L4 lumbar vertebra, initial encounter (HCC)  Sciatica of left side    Rx / DC Orders ED Discharge Orders  Ordered    oxyCODONE-acetaminophen (PERCOCET/ROXICET) 5-325 MG tablet  Every 6 hours PRN        12/07/22 1751    lidocaine (LIDODERM) 5 %  Every 24 hours        12/07/22 1751              Mardene Sayer, MD 12/08/22 [id number]

## 2022-12-07 NOTE — Discharge Instructions (Addendum)
You have been seen in the Emergency Department (ED) today following a fall.  As we discussed, your x-ray did show a compression fracture of your L4 spine.  Continue resting as needed for pain control as well as using Tylenol and ibuprofen for pain control.  If pain is still severe, you can take a half to full dose of Percocet as needed.  Do not take while driving a vehicle or drinking alcohol.  This medication can make you drowsy.  You can also use the lidocaine patches we have prescribed.  Please follow up with your primary care doctor as soon as possible regarding today's ED visit and your recent accident.  We have also put in information for the spine surgery clinic and rehab facility in case you would like to pursue further outpatient management such as physical therapy.   Call your doctor or return to the ED if you develop a sudden or severe headache, confusion, slurred speech, facial droop, weakness or numbness in any arm or leg, loss of control of your bladder or bowels, new numbness or tingling or weakness in your legs or pelvis, extreme fatigue, vomiting more than two times, severe abdominal pain, difficulty breathing or any other concerning signs or symptoms.

## 2022-12-07 NOTE — ED Notes (Signed)
Got pt to the side on the bed pt was in to much pain. Was able to stand then pt sat back down because of the pain EDMD notified.

## 2022-12-07 NOTE — ED Triage Notes (Signed)
Pt states she was on the porch and turned around from getting up from a chair and fell onto her buttocks a week ago. Pt c/o left leg/hip pain started yesterday. Pt states it was hard to walk yesterday due to the pain. Pt has 2+ swelling of left ankle. Pt has 2+ left pedal pulse, warm to touch

## 2022-12-08 ENCOUNTER — Telehealth: Payer: Self-pay | Admitting: Surgery

## 2022-12-08 ENCOUNTER — Telehealth (HOSPITAL_BASED_OUTPATIENT_CLINIC_OR_DEPARTMENT_OTHER): Payer: Self-pay | Admitting: Emergency Medicine

## 2022-12-08 MED ORDER — OXYCODONE-ACETAMINOPHEN 5-325 MG PO TABS: 1.0000 | ORAL_TABLET | Freq: Four times a day (QID) | ORAL | 0 refills | Status: DC | PRN

## 2022-12-08 NOTE — Telephone Encounter (Signed)
Patient requested Percocet to be sent to [facility] as initial prescription sent to mail order pharmacy and wanted pharmacy to have prescription now.  [facility]

## 2022-12-08 NOTE — Telephone Encounter (Signed)
Percocet Prescription called into [facility] on [address] Patient's son notified. No further ED RNCM needs

## 2022-12-08 NOTE — Telephone Encounter (Signed)
ED RNCM received call from patient's son concerning proscription for pain meds going to a mail order pharmacy. He is requesting prescription be sent to [facility] on [address], message sent to Dr Elpidio Anis EDP.

## 2022-12-12 DIAGNOSIS — S32040D Wedge compression fracture of fourth lumbar vertebra, subsequent encounter for fracture with routine healing: Secondary | ICD-10-CM | POA: Diagnosis not present

## 2022-12-15 DIAGNOSIS — D631 Anemia in chronic kidney disease: Secondary | ICD-10-CM | POA: Diagnosis not present

## 2022-12-15 DIAGNOSIS — R339 Retention of urine, unspecified: Secondary | ICD-10-CM | POA: Diagnosis not present

## 2022-12-15 DIAGNOSIS — Z466 Encounter for fitting and adjustment of urinary device: Secondary | ICD-10-CM | POA: Diagnosis not present

## 2022-12-15 DIAGNOSIS — I130 Hypertensive heart and chronic kidney disease with heart failure and stage 1 through stage 4 chronic kidney disease, or unspecified chronic kidney disease: Secondary | ICD-10-CM | POA: Diagnosis not present

## 2022-12-15 DIAGNOSIS — I5032 Chronic diastolic (congestive) heart failure: Secondary | ICD-10-CM | POA: Diagnosis not present

## 2022-12-15 DIAGNOSIS — N1832 Chronic kidney disease, stage 3b: Secondary | ICD-10-CM | POA: Diagnosis not present

## 2022-12-16 ENCOUNTER — Encounter (INDEPENDENT_AMBULATORY_CARE_PROVIDER_SITE_OTHER): Payer: Medicare Other | Admitting: Ophthalmology

## 2022-12-18 DIAGNOSIS — S32040D Wedge compression fracture of fourth lumbar vertebra, subsequent encounter for fracture with routine healing: Secondary | ICD-10-CM | POA: Diagnosis not present

## 2022-12-18 DIAGNOSIS — K5909 Other constipation: Secondary | ICD-10-CM | POA: Diagnosis not present

## 2022-12-18 DIAGNOSIS — R5381 Other malaise: Secondary | ICD-10-CM | POA: Diagnosis not present

## 2022-12-18 DIAGNOSIS — R4589 Other symptoms and signs involving emotional state: Secondary | ICD-10-CM | POA: Diagnosis not present

## 2022-12-24 DIAGNOSIS — D631 Anemia in chronic kidney disease: Secondary | ICD-10-CM | POA: Diagnosis not present

## 2022-12-24 DIAGNOSIS — I130 Hypertensive heart and chronic kidney disease with heart failure and stage 1 through stage 4 chronic kidney disease, or unspecified chronic kidney disease: Secondary | ICD-10-CM | POA: Diagnosis not present

## 2022-12-24 DIAGNOSIS — R339 Retention of urine, unspecified: Secondary | ICD-10-CM | POA: Diagnosis not present

## 2022-12-24 DIAGNOSIS — N1832 Chronic kidney disease, stage 3b: Secondary | ICD-10-CM | POA: Diagnosis not present

## 2022-12-24 DIAGNOSIS — Z466 Encounter for fitting and adjustment of urinary device: Secondary | ICD-10-CM | POA: Diagnosis not present

## 2022-12-24 DIAGNOSIS — I5032 Chronic diastolic (congestive) heart failure: Secondary | ICD-10-CM | POA: Diagnosis not present

## 2022-12-31 DIAGNOSIS — R339 Retention of urine, unspecified: Secondary | ICD-10-CM | POA: Diagnosis not present

## 2022-12-31 DIAGNOSIS — I130 Hypertensive heart and chronic kidney disease with heart failure and stage 1 through stage 4 chronic kidney disease, or unspecified chronic kidney disease: Secondary | ICD-10-CM | POA: Diagnosis not present

## 2022-12-31 DIAGNOSIS — D631 Anemia in chronic kidney disease: Secondary | ICD-10-CM | POA: Diagnosis not present

## 2022-12-31 DIAGNOSIS — N1832 Chronic kidney disease, stage 3b: Secondary | ICD-10-CM | POA: Diagnosis not present

## 2022-12-31 DIAGNOSIS — I5032 Chronic diastolic (congestive) heart failure: Secondary | ICD-10-CM | POA: Diagnosis not present

## 2022-12-31 DIAGNOSIS — Z466 Encounter for fitting and adjustment of urinary device: Secondary | ICD-10-CM | POA: Diagnosis not present

## 2023-01-01 DIAGNOSIS — D631 Anemia in chronic kidney disease: Secondary | ICD-10-CM | POA: Diagnosis not present

## 2023-01-01 DIAGNOSIS — N1832 Chronic kidney disease, stage 3b: Secondary | ICD-10-CM | POA: Diagnosis not present

## 2023-01-01 DIAGNOSIS — R339 Retention of urine, unspecified: Secondary | ICD-10-CM | POA: Diagnosis not present

## 2023-01-01 DIAGNOSIS — Z466 Encounter for fitting and adjustment of urinary device: Secondary | ICD-10-CM | POA: Diagnosis not present

## 2023-01-01 DIAGNOSIS — I5032 Chronic diastolic (congestive) heart failure: Secondary | ICD-10-CM | POA: Diagnosis not present

## 2023-01-01 DIAGNOSIS — I130 Hypertensive heart and chronic kidney disease with heart failure and stage 1 through stage 4 chronic kidney disease, or unspecified chronic kidney disease: Secondary | ICD-10-CM | POA: Diagnosis not present

## 2023-01-05 DIAGNOSIS — Z8701 Personal history of pneumonia (recurrent): Secondary | ICD-10-CM | POA: Diagnosis not present

## 2023-01-05 DIAGNOSIS — I130 Hypertensive heart and chronic kidney disease with heart failure and stage 1 through stage 4 chronic kidney disease, or unspecified chronic kidney disease: Secondary | ICD-10-CM | POA: Diagnosis not present

## 2023-01-05 DIAGNOSIS — E039 Hypothyroidism, unspecified: Secondary | ICD-10-CM | POA: Diagnosis not present

## 2023-01-05 DIAGNOSIS — Z466 Encounter for fitting and adjustment of urinary device: Secondary | ICD-10-CM | POA: Diagnosis not present

## 2023-01-05 DIAGNOSIS — N1832 Chronic kidney disease, stage 3b: Secondary | ICD-10-CM | POA: Diagnosis not present

## 2023-01-05 DIAGNOSIS — R351 Nocturia: Secondary | ICD-10-CM | POA: Diagnosis not present

## 2023-01-05 DIAGNOSIS — H409 Unspecified glaucoma: Secondary | ICD-10-CM | POA: Diagnosis not present

## 2023-01-05 DIAGNOSIS — R339 Retention of urine, unspecified: Secondary | ICD-10-CM | POA: Diagnosis not present

## 2023-01-05 DIAGNOSIS — K5900 Constipation, unspecified: Secondary | ICD-10-CM | POA: Diagnosis not present

## 2023-01-05 DIAGNOSIS — N3946 Mixed incontinence: Secondary | ICD-10-CM | POA: Diagnosis not present

## 2023-01-05 DIAGNOSIS — D631 Anemia in chronic kidney disease: Secondary | ICD-10-CM | POA: Diagnosis not present

## 2023-01-05 DIAGNOSIS — I5032 Chronic diastolic (congestive) heart failure: Secondary | ICD-10-CM | POA: Diagnosis not present

## 2023-01-05 DIAGNOSIS — E785 Hyperlipidemia, unspecified: Secondary | ICD-10-CM | POA: Diagnosis not present

## 2023-01-05 DIAGNOSIS — Z87440 Personal history of urinary (tract) infections: Secondary | ICD-10-CM | POA: Diagnosis not present

## 2023-01-05 DIAGNOSIS — M1990 Unspecified osteoarthritis, unspecified site: Secondary | ICD-10-CM | POA: Diagnosis not present

## 2023-01-06 DIAGNOSIS — I130 Hypertensive heart and chronic kidney disease with heart failure and stage 1 through stage 4 chronic kidney disease, or unspecified chronic kidney disease: Secondary | ICD-10-CM | POA: Diagnosis not present

## 2023-01-06 DIAGNOSIS — R339 Retention of urine, unspecified: Secondary | ICD-10-CM | POA: Diagnosis not present

## 2023-01-06 DIAGNOSIS — D631 Anemia in chronic kidney disease: Secondary | ICD-10-CM | POA: Diagnosis not present

## 2023-01-06 DIAGNOSIS — Z466 Encounter for fitting and adjustment of urinary device: Secondary | ICD-10-CM | POA: Diagnosis not present

## 2023-01-06 DIAGNOSIS — I5032 Chronic diastolic (congestive) heart failure: Secondary | ICD-10-CM | POA: Diagnosis not present

## 2023-01-06 DIAGNOSIS — N1832 Chronic kidney disease, stage 3b: Secondary | ICD-10-CM | POA: Diagnosis not present

## 2023-01-07 DIAGNOSIS — I130 Hypertensive heart and chronic kidney disease with heart failure and stage 1 through stage 4 chronic kidney disease, or unspecified chronic kidney disease: Secondary | ICD-10-CM | POA: Diagnosis not present

## 2023-01-07 DIAGNOSIS — I5032 Chronic diastolic (congestive) heart failure: Secondary | ICD-10-CM | POA: Diagnosis not present

## 2023-01-07 DIAGNOSIS — D631 Anemia in chronic kidney disease: Secondary | ICD-10-CM | POA: Diagnosis not present

## 2023-01-07 DIAGNOSIS — N1832 Chronic kidney disease, stage 3b: Secondary | ICD-10-CM | POA: Diagnosis not present

## 2023-01-07 DIAGNOSIS — Z466 Encounter for fitting and adjustment of urinary device: Secondary | ICD-10-CM | POA: Diagnosis not present

## 2023-01-07 DIAGNOSIS — R339 Retention of urine, unspecified: Secondary | ICD-10-CM | POA: Diagnosis not present

## 2023-01-09 DIAGNOSIS — R339 Retention of urine, unspecified: Secondary | ICD-10-CM | POA: Diagnosis not present

## 2023-01-09 DIAGNOSIS — I5032 Chronic diastolic (congestive) heart failure: Secondary | ICD-10-CM | POA: Diagnosis not present

## 2023-01-09 DIAGNOSIS — Z466 Encounter for fitting and adjustment of urinary device: Secondary | ICD-10-CM | POA: Diagnosis not present

## 2023-01-09 DIAGNOSIS — D631 Anemia in chronic kidney disease: Secondary | ICD-10-CM | POA: Diagnosis not present

## 2023-01-09 DIAGNOSIS — I130 Hypertensive heart and chronic kidney disease with heart failure and stage 1 through stage 4 chronic kidney disease, or unspecified chronic kidney disease: Secondary | ICD-10-CM | POA: Diagnosis not present

## 2023-01-09 DIAGNOSIS — N1832 Chronic kidney disease, stage 3b: Secondary | ICD-10-CM | POA: Diagnosis not present

## 2023-01-14 DIAGNOSIS — D631 Anemia in chronic kidney disease: Secondary | ICD-10-CM | POA: Diagnosis not present

## 2023-01-14 DIAGNOSIS — R339 Retention of urine, unspecified: Secondary | ICD-10-CM | POA: Diagnosis not present

## 2023-01-14 DIAGNOSIS — N1832 Chronic kidney disease, stage 3b: Secondary | ICD-10-CM | POA: Diagnosis not present

## 2023-01-14 DIAGNOSIS — Z466 Encounter for fitting and adjustment of urinary device: Secondary | ICD-10-CM | POA: Diagnosis not present

## 2023-01-14 DIAGNOSIS — I5032 Chronic diastolic (congestive) heart failure: Secondary | ICD-10-CM | POA: Diagnosis not present

## 2023-01-14 DIAGNOSIS — I130 Hypertensive heart and chronic kidney disease with heart failure and stage 1 through stage 4 chronic kidney disease, or unspecified chronic kidney disease: Secondary | ICD-10-CM | POA: Diagnosis not present

## 2023-01-15 DIAGNOSIS — N1832 Chronic kidney disease, stage 3b: Secondary | ICD-10-CM | POA: Diagnosis not present

## 2023-01-15 DIAGNOSIS — I130 Hypertensive heart and chronic kidney disease with heart failure and stage 1 through stage 4 chronic kidney disease, or unspecified chronic kidney disease: Secondary | ICD-10-CM | POA: Diagnosis not present

## 2023-01-15 DIAGNOSIS — Z466 Encounter for fitting and adjustment of urinary device: Secondary | ICD-10-CM | POA: Diagnosis not present

## 2023-01-15 DIAGNOSIS — D631 Anemia in chronic kidney disease: Secondary | ICD-10-CM | POA: Diagnosis not present

## 2023-01-15 DIAGNOSIS — I5032 Chronic diastolic (congestive) heart failure: Secondary | ICD-10-CM | POA: Diagnosis not present

## 2023-01-15 DIAGNOSIS — R339 Retention of urine, unspecified: Secondary | ICD-10-CM | POA: Diagnosis not present

## 2023-01-16 DIAGNOSIS — D631 Anemia in chronic kidney disease: Secondary | ICD-10-CM | POA: Diagnosis not present

## 2023-01-16 DIAGNOSIS — N1832 Chronic kidney disease, stage 3b: Secondary | ICD-10-CM | POA: Diagnosis not present

## 2023-01-16 DIAGNOSIS — R339 Retention of urine, unspecified: Secondary | ICD-10-CM | POA: Diagnosis not present

## 2023-01-16 DIAGNOSIS — I5032 Chronic diastolic (congestive) heart failure: Secondary | ICD-10-CM | POA: Diagnosis not present

## 2023-01-16 DIAGNOSIS — Z466 Encounter for fitting and adjustment of urinary device: Secondary | ICD-10-CM | POA: Diagnosis not present

## 2023-01-16 DIAGNOSIS — I130 Hypertensive heart and chronic kidney disease with heart failure and stage 1 through stage 4 chronic kidney disease, or unspecified chronic kidney disease: Secondary | ICD-10-CM | POA: Diagnosis not present

## 2023-01-20 ENCOUNTER — Ambulatory Visit: Payer: Medicare Other | Admitting: Podiatry

## 2023-01-21 DIAGNOSIS — R339 Retention of urine, unspecified: Secondary | ICD-10-CM | POA: Diagnosis not present

## 2023-01-21 DIAGNOSIS — Z466 Encounter for fitting and adjustment of urinary device: Secondary | ICD-10-CM | POA: Diagnosis not present

## 2023-01-21 DIAGNOSIS — I130 Hypertensive heart and chronic kidney disease with heart failure and stage 1 through stage 4 chronic kidney disease, or unspecified chronic kidney disease: Secondary | ICD-10-CM | POA: Diagnosis not present

## 2023-01-21 DIAGNOSIS — I5032 Chronic diastolic (congestive) heart failure: Secondary | ICD-10-CM | POA: Diagnosis not present

## 2023-01-21 DIAGNOSIS — N1832 Chronic kidney disease, stage 3b: Secondary | ICD-10-CM | POA: Diagnosis not present

## 2023-01-21 DIAGNOSIS — D631 Anemia in chronic kidney disease: Secondary | ICD-10-CM | POA: Diagnosis not present

## 2023-01-23 DIAGNOSIS — N1832 Chronic kidney disease, stage 3b: Secondary | ICD-10-CM | POA: Diagnosis not present

## 2023-01-23 DIAGNOSIS — I130 Hypertensive heart and chronic kidney disease with heart failure and stage 1 through stage 4 chronic kidney disease, or unspecified chronic kidney disease: Secondary | ICD-10-CM | POA: Diagnosis not present

## 2023-01-23 DIAGNOSIS — I5032 Chronic diastolic (congestive) heart failure: Secondary | ICD-10-CM | POA: Diagnosis not present

## 2023-01-23 DIAGNOSIS — R339 Retention of urine, unspecified: Secondary | ICD-10-CM | POA: Diagnosis not present

## 2023-01-23 DIAGNOSIS — D631 Anemia in chronic kidney disease: Secondary | ICD-10-CM | POA: Diagnosis not present

## 2023-01-23 DIAGNOSIS — Z466 Encounter for fitting and adjustment of urinary device: Secondary | ICD-10-CM | POA: Diagnosis not present

## 2023-01-29 DIAGNOSIS — I5032 Chronic diastolic (congestive) heart failure: Secondary | ICD-10-CM | POA: Diagnosis not present

## 2023-01-29 DIAGNOSIS — D631 Anemia in chronic kidney disease: Secondary | ICD-10-CM | POA: Diagnosis not present

## 2023-01-29 DIAGNOSIS — I130 Hypertensive heart and chronic kidney disease with heart failure and stage 1 through stage 4 chronic kidney disease, or unspecified chronic kidney disease: Secondary | ICD-10-CM | POA: Diagnosis not present

## 2023-01-29 DIAGNOSIS — R339 Retention of urine, unspecified: Secondary | ICD-10-CM | POA: Diagnosis not present

## 2023-01-29 DIAGNOSIS — Z466 Encounter for fitting and adjustment of urinary device: Secondary | ICD-10-CM | POA: Diagnosis not present

## 2023-01-29 DIAGNOSIS — N1832 Chronic kidney disease, stage 3b: Secondary | ICD-10-CM | POA: Diagnosis not present

## 2023-01-30 DIAGNOSIS — Z466 Encounter for fitting and adjustment of urinary device: Secondary | ICD-10-CM | POA: Diagnosis not present

## 2023-01-30 DIAGNOSIS — I130 Hypertensive heart and chronic kidney disease with heart failure and stage 1 through stage 4 chronic kidney disease, or unspecified chronic kidney disease: Secondary | ICD-10-CM | POA: Diagnosis not present

## 2023-01-30 DIAGNOSIS — N1832 Chronic kidney disease, stage 3b: Secondary | ICD-10-CM | POA: Diagnosis not present

## 2023-01-30 DIAGNOSIS — D631 Anemia in chronic kidney disease: Secondary | ICD-10-CM | POA: Diagnosis not present

## 2023-01-30 DIAGNOSIS — R339 Retention of urine, unspecified: Secondary | ICD-10-CM | POA: Diagnosis not present

## 2023-01-30 DIAGNOSIS — I5032 Chronic diastolic (congestive) heart failure: Secondary | ICD-10-CM | POA: Diagnosis not present

## 2023-02-02 DIAGNOSIS — N1832 Chronic kidney disease, stage 3b: Secondary | ICD-10-CM | POA: Diagnosis not present

## 2023-02-02 DIAGNOSIS — D631 Anemia in chronic kidney disease: Secondary | ICD-10-CM | POA: Diagnosis not present

## 2023-02-02 DIAGNOSIS — I130 Hypertensive heart and chronic kidney disease with heart failure and stage 1 through stage 4 chronic kidney disease, or unspecified chronic kidney disease: Secondary | ICD-10-CM | POA: Diagnosis not present

## 2023-02-02 DIAGNOSIS — Z466 Encounter for fitting and adjustment of urinary device: Secondary | ICD-10-CM | POA: Diagnosis not present

## 2023-02-02 DIAGNOSIS — R339 Retention of urine, unspecified: Secondary | ICD-10-CM | POA: Diagnosis not present

## 2023-02-02 DIAGNOSIS — I5032 Chronic diastolic (congestive) heart failure: Secondary | ICD-10-CM | POA: Diagnosis not present

## 2023-02-04 DIAGNOSIS — R351 Nocturia: Secondary | ICD-10-CM | POA: Diagnosis not present

## 2023-02-04 DIAGNOSIS — R339 Retention of urine, unspecified: Secondary | ICD-10-CM | POA: Diagnosis not present

## 2023-02-04 DIAGNOSIS — Z87440 Personal history of urinary (tract) infections: Secondary | ICD-10-CM | POA: Diagnosis not present

## 2023-02-04 DIAGNOSIS — Z8701 Personal history of pneumonia (recurrent): Secondary | ICD-10-CM | POA: Diagnosis not present

## 2023-02-04 DIAGNOSIS — K5900 Constipation, unspecified: Secondary | ICD-10-CM | POA: Diagnosis not present

## 2023-02-04 DIAGNOSIS — Z466 Encounter for fitting and adjustment of urinary device: Secondary | ICD-10-CM | POA: Diagnosis not present

## 2023-02-04 DIAGNOSIS — I5032 Chronic diastolic (congestive) heart failure: Secondary | ICD-10-CM | POA: Diagnosis not present

## 2023-02-04 DIAGNOSIS — N1832 Chronic kidney disease, stage 3b: Secondary | ICD-10-CM | POA: Diagnosis not present

## 2023-02-04 DIAGNOSIS — H409 Unspecified glaucoma: Secondary | ICD-10-CM | POA: Diagnosis not present

## 2023-02-04 DIAGNOSIS — I130 Hypertensive heart and chronic kidney disease with heart failure and stage 1 through stage 4 chronic kidney disease, or unspecified chronic kidney disease: Secondary | ICD-10-CM | POA: Diagnosis not present

## 2023-02-04 DIAGNOSIS — E785 Hyperlipidemia, unspecified: Secondary | ICD-10-CM | POA: Diagnosis not present

## 2023-02-04 DIAGNOSIS — E039 Hypothyroidism, unspecified: Secondary | ICD-10-CM | POA: Diagnosis not present

## 2023-02-04 DIAGNOSIS — M1990 Unspecified osteoarthritis, unspecified site: Secondary | ICD-10-CM | POA: Diagnosis not present

## 2023-02-04 DIAGNOSIS — D631 Anemia in chronic kidney disease: Secondary | ICD-10-CM | POA: Diagnosis not present

## 2023-02-04 DIAGNOSIS — N3946 Mixed incontinence: Secondary | ICD-10-CM | POA: Diagnosis not present

## 2023-02-09 DIAGNOSIS — Z466 Encounter for fitting and adjustment of urinary device: Secondary | ICD-10-CM | POA: Diagnosis not present

## 2023-02-09 DIAGNOSIS — I130 Hypertensive heart and chronic kidney disease with heart failure and stage 1 through stage 4 chronic kidney disease, or unspecified chronic kidney disease: Secondary | ICD-10-CM | POA: Diagnosis not present

## 2023-02-09 DIAGNOSIS — I5032 Chronic diastolic (congestive) heart failure: Secondary | ICD-10-CM | POA: Diagnosis not present

## 2023-02-09 DIAGNOSIS — R339 Retention of urine, unspecified: Secondary | ICD-10-CM | POA: Diagnosis not present

## 2023-02-09 DIAGNOSIS — D631 Anemia in chronic kidney disease: Secondary | ICD-10-CM | POA: Diagnosis not present

## 2023-02-09 DIAGNOSIS — N1832 Chronic kidney disease, stage 3b: Secondary | ICD-10-CM | POA: Diagnosis not present

## 2023-02-11 ENCOUNTER — Encounter (INDEPENDENT_AMBULATORY_CARE_PROVIDER_SITE_OTHER): Payer: Medicare Other | Admitting: Ophthalmology

## 2023-02-11 DIAGNOSIS — H35033 Hypertensive retinopathy, bilateral: Secondary | ICD-10-CM | POA: Diagnosis not present

## 2023-02-11 DIAGNOSIS — H353231 Exudative age-related macular degeneration, bilateral, with active choroidal neovascularization: Secondary | ICD-10-CM | POA: Diagnosis not present

## 2023-02-11 DIAGNOSIS — I10 Essential (primary) hypertension: Secondary | ICD-10-CM

## 2023-02-11 DIAGNOSIS — H43813 Vitreous degeneration, bilateral: Secondary | ICD-10-CM | POA: Diagnosis not present

## 2023-02-12 DIAGNOSIS — Z466 Encounter for fitting and adjustment of urinary device: Secondary | ICD-10-CM | POA: Diagnosis not present

## 2023-02-12 DIAGNOSIS — N1832 Chronic kidney disease, stage 3b: Secondary | ICD-10-CM | POA: Diagnosis not present

## 2023-02-12 DIAGNOSIS — R339 Retention of urine, unspecified: Secondary | ICD-10-CM | POA: Diagnosis not present

## 2023-02-12 DIAGNOSIS — I5032 Chronic diastolic (congestive) heart failure: Secondary | ICD-10-CM | POA: Diagnosis not present

## 2023-02-12 DIAGNOSIS — I130 Hypertensive heart and chronic kidney disease with heart failure and stage 1 through stage 4 chronic kidney disease, or unspecified chronic kidney disease: Secondary | ICD-10-CM | POA: Diagnosis not present

## 2023-02-12 DIAGNOSIS — D631 Anemia in chronic kidney disease: Secondary | ICD-10-CM | POA: Diagnosis not present

## 2023-02-17 DIAGNOSIS — Z466 Encounter for fitting and adjustment of urinary device: Secondary | ICD-10-CM | POA: Diagnosis not present

## 2023-02-17 DIAGNOSIS — N1832 Chronic kidney disease, stage 3b: Secondary | ICD-10-CM | POA: Diagnosis not present

## 2023-02-17 DIAGNOSIS — D631 Anemia in chronic kidney disease: Secondary | ICD-10-CM | POA: Diagnosis not present

## 2023-02-17 DIAGNOSIS — I130 Hypertensive heart and chronic kidney disease with heart failure and stage 1 through stage 4 chronic kidney disease, or unspecified chronic kidney disease: Secondary | ICD-10-CM | POA: Diagnosis not present

## 2023-02-17 DIAGNOSIS — I5032 Chronic diastolic (congestive) heart failure: Secondary | ICD-10-CM | POA: Diagnosis not present

## 2023-02-17 DIAGNOSIS — R339 Retention of urine, unspecified: Secondary | ICD-10-CM | POA: Diagnosis not present

## 2023-02-19 DIAGNOSIS — D631 Anemia in chronic kidney disease: Secondary | ICD-10-CM | POA: Diagnosis not present

## 2023-02-19 DIAGNOSIS — I5032 Chronic diastolic (congestive) heart failure: Secondary | ICD-10-CM | POA: Diagnosis not present

## 2023-02-19 DIAGNOSIS — I130 Hypertensive heart and chronic kidney disease with heart failure and stage 1 through stage 4 chronic kidney disease, or unspecified chronic kidney disease: Secondary | ICD-10-CM | POA: Diagnosis not present

## 2023-02-19 DIAGNOSIS — R339 Retention of urine, unspecified: Secondary | ICD-10-CM | POA: Diagnosis not present

## 2023-02-19 DIAGNOSIS — N1832 Chronic kidney disease, stage 3b: Secondary | ICD-10-CM | POA: Diagnosis not present

## 2023-02-19 DIAGNOSIS — Z466 Encounter for fitting and adjustment of urinary device: Secondary | ICD-10-CM | POA: Diagnosis not present

## 2023-02-20 ENCOUNTER — Ambulatory Visit (INDEPENDENT_AMBULATORY_CARE_PROVIDER_SITE_OTHER): Payer: Medicare Other | Admitting: Podiatry

## 2023-02-20 ENCOUNTER — Encounter: Payer: Self-pay | Admitting: Podiatry

## 2023-02-20 DIAGNOSIS — I10 Essential (primary) hypertension: Secondary | ICD-10-CM | POA: Diagnosis not present

## 2023-02-20 DIAGNOSIS — B351 Tinea unguium: Secondary | ICD-10-CM

## 2023-02-20 DIAGNOSIS — H6121 Impacted cerumen, right ear: Secondary | ICD-10-CM | POA: Diagnosis not present

## 2023-02-20 DIAGNOSIS — M79675 Pain in left toe(s): Secondary | ICD-10-CM | POA: Diagnosis not present

## 2023-02-20 DIAGNOSIS — D689 Coagulation defect, unspecified: Secondary | ICD-10-CM

## 2023-02-20 DIAGNOSIS — M79674 Pain in right toe(s): Secondary | ICD-10-CM

## 2023-02-20 DIAGNOSIS — R001 Bradycardia, unspecified: Secondary | ICD-10-CM | POA: Diagnosis not present

## 2023-02-20 NOTE — Progress Notes (Signed)
This patient returns to my office for at risk foot care.  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  This patient is unable to cut nails herself since the patient cannot reach her nails.This patient presents for at risk foot care today.   General Appearance  Alert, conversant and in no acute stress.  Vascular  Dorsalis pedis and posterior tibial  pulses are  weakly palpable  bilaterally.  Capillary return is within normal limits  bilaterally. Temperature is within normal limits  bilaterally.  Neurologic  Senn-Weinstein monofilament wire test within normal limits  bilaterally. Muscle power within normal limits bilaterally.  Nails Thick disfigured discolored nails with subungual debris  from hallux to fifth toes bilaterally. No evidence of bacterial infection or drainage bilaterally.    Orthopedic  No limitations of motion  feet .  No crepitus or effusions noted.  No bony pathology or digital deformities noted.  Skin  normotropic skin with no porokeratosis noted bilaterally.  No signs of infections or ulcers noted.     Onychomycosis  Pain in right toes  Pain in left toes    Consent was obtained for treatment procedures.   Mechanical debridement of nails 1-5  bilaterally performed with a nail nipper.  Filed with dremel without incident.    Return office visit   3 months.                  Told patient to return for periodic foot care and evaluation due to potential at risk complications.   Gardiner Barefoot DPM

## 2023-02-23 DIAGNOSIS — N1832 Chronic kidney disease, stage 3b: Secondary | ICD-10-CM | POA: Diagnosis not present

## 2023-02-23 DIAGNOSIS — R339 Retention of urine, unspecified: Secondary | ICD-10-CM | POA: Diagnosis not present

## 2023-02-23 DIAGNOSIS — Z466 Encounter for fitting and adjustment of urinary device: Secondary | ICD-10-CM | POA: Diagnosis not present

## 2023-02-23 DIAGNOSIS — I130 Hypertensive heart and chronic kidney disease with heart failure and stage 1 through stage 4 chronic kidney disease, or unspecified chronic kidney disease: Secondary | ICD-10-CM | POA: Diagnosis not present

## 2023-02-23 DIAGNOSIS — D631 Anemia in chronic kidney disease: Secondary | ICD-10-CM | POA: Diagnosis not present

## 2023-02-23 DIAGNOSIS — I5032 Chronic diastolic (congestive) heart failure: Secondary | ICD-10-CM | POA: Diagnosis not present

## 2023-02-24 DIAGNOSIS — R339 Retention of urine, unspecified: Secondary | ICD-10-CM | POA: Diagnosis not present

## 2023-02-24 DIAGNOSIS — I5032 Chronic diastolic (congestive) heart failure: Secondary | ICD-10-CM | POA: Diagnosis not present

## 2023-02-24 DIAGNOSIS — I130 Hypertensive heart and chronic kidney disease with heart failure and stage 1 through stage 4 chronic kidney disease, or unspecified chronic kidney disease: Secondary | ICD-10-CM | POA: Diagnosis not present

## 2023-02-24 DIAGNOSIS — Z466 Encounter for fitting and adjustment of urinary device: Secondary | ICD-10-CM | POA: Diagnosis not present

## 2023-02-24 DIAGNOSIS — D631 Anemia in chronic kidney disease: Secondary | ICD-10-CM | POA: Diagnosis not present

## 2023-02-24 DIAGNOSIS — N1832 Chronic kidney disease, stage 3b: Secondary | ICD-10-CM | POA: Diagnosis not present

## 2023-03-03 DIAGNOSIS — H40052 Ocular hypertension, left eye: Secondary | ICD-10-CM | POA: Diagnosis not present

## 2023-03-03 DIAGNOSIS — H40023 Open angle with borderline findings, high risk, bilateral: Secondary | ICD-10-CM | POA: Diagnosis not present

## 2023-03-04 DIAGNOSIS — I5032 Chronic diastolic (congestive) heart failure: Secondary | ICD-10-CM | POA: Diagnosis not present

## 2023-03-04 DIAGNOSIS — Z466 Encounter for fitting and adjustment of urinary device: Secondary | ICD-10-CM | POA: Diagnosis not present

## 2023-03-04 DIAGNOSIS — I130 Hypertensive heart and chronic kidney disease with heart failure and stage 1 through stage 4 chronic kidney disease, or unspecified chronic kidney disease: Secondary | ICD-10-CM | POA: Diagnosis not present

## 2023-03-04 DIAGNOSIS — N1832 Chronic kidney disease, stage 3b: Secondary | ICD-10-CM | POA: Diagnosis not present

## 2023-03-04 DIAGNOSIS — D631 Anemia in chronic kidney disease: Secondary | ICD-10-CM | POA: Diagnosis not present

## 2023-03-04 DIAGNOSIS — R339 Retention of urine, unspecified: Secondary | ICD-10-CM | POA: Diagnosis not present

## 2023-03-05 DIAGNOSIS — N1832 Chronic kidney disease, stage 3b: Secondary | ICD-10-CM | POA: Diagnosis not present

## 2023-03-05 DIAGNOSIS — I130 Hypertensive heart and chronic kidney disease with heart failure and stage 1 through stage 4 chronic kidney disease, or unspecified chronic kidney disease: Secondary | ICD-10-CM | POA: Diagnosis not present

## 2023-03-05 DIAGNOSIS — I5032 Chronic diastolic (congestive) heart failure: Secondary | ICD-10-CM | POA: Diagnosis not present

## 2023-03-05 DIAGNOSIS — R339 Retention of urine, unspecified: Secondary | ICD-10-CM | POA: Diagnosis not present

## 2023-03-05 DIAGNOSIS — D631 Anemia in chronic kidney disease: Secondary | ICD-10-CM | POA: Diagnosis not present

## 2023-03-05 DIAGNOSIS — Z466 Encounter for fitting and adjustment of urinary device: Secondary | ICD-10-CM | POA: Diagnosis not present

## 2023-03-06 DIAGNOSIS — R339 Retention of urine, unspecified: Secondary | ICD-10-CM | POA: Diagnosis not present

## 2023-03-06 DIAGNOSIS — D631 Anemia in chronic kidney disease: Secondary | ICD-10-CM | POA: Diagnosis not present

## 2023-03-06 DIAGNOSIS — R351 Nocturia: Secondary | ICD-10-CM | POA: Diagnosis not present

## 2023-03-06 DIAGNOSIS — Z466 Encounter for fitting and adjustment of urinary device: Secondary | ICD-10-CM | POA: Diagnosis not present

## 2023-03-06 DIAGNOSIS — H409 Unspecified glaucoma: Secondary | ICD-10-CM | POA: Diagnosis not present

## 2023-03-06 DIAGNOSIS — I130 Hypertensive heart and chronic kidney disease with heart failure and stage 1 through stage 4 chronic kidney disease, or unspecified chronic kidney disease: Secondary | ICD-10-CM | POA: Diagnosis not present

## 2023-03-06 DIAGNOSIS — Z8701 Personal history of pneumonia (recurrent): Secondary | ICD-10-CM | POA: Diagnosis not present

## 2023-03-06 DIAGNOSIS — Z87440 Personal history of urinary (tract) infections: Secondary | ICD-10-CM | POA: Diagnosis not present

## 2023-03-06 DIAGNOSIS — K5900 Constipation, unspecified: Secondary | ICD-10-CM | POA: Diagnosis not present

## 2023-03-06 DIAGNOSIS — E039 Hypothyroidism, unspecified: Secondary | ICD-10-CM | POA: Diagnosis not present

## 2023-03-06 DIAGNOSIS — N1832 Chronic kidney disease, stage 3b: Secondary | ICD-10-CM | POA: Diagnosis not present

## 2023-03-06 DIAGNOSIS — N3946 Mixed incontinence: Secondary | ICD-10-CM | POA: Diagnosis not present

## 2023-03-06 DIAGNOSIS — M1990 Unspecified osteoarthritis, unspecified site: Secondary | ICD-10-CM | POA: Diagnosis not present

## 2023-03-06 DIAGNOSIS — E785 Hyperlipidemia, unspecified: Secondary | ICD-10-CM | POA: Diagnosis not present

## 2023-03-06 DIAGNOSIS — I5032 Chronic diastolic (congestive) heart failure: Secondary | ICD-10-CM | POA: Diagnosis not present

## 2023-03-09 DIAGNOSIS — I130 Hypertensive heart and chronic kidney disease with heart failure and stage 1 through stage 4 chronic kidney disease, or unspecified chronic kidney disease: Secondary | ICD-10-CM | POA: Diagnosis not present

## 2023-03-09 DIAGNOSIS — N1832 Chronic kidney disease, stage 3b: Secondary | ICD-10-CM | POA: Diagnosis not present

## 2023-03-09 DIAGNOSIS — I5032 Chronic diastolic (congestive) heart failure: Secondary | ICD-10-CM | POA: Diagnosis not present

## 2023-03-09 DIAGNOSIS — R339 Retention of urine, unspecified: Secondary | ICD-10-CM | POA: Diagnosis not present

## 2023-03-09 DIAGNOSIS — D631 Anemia in chronic kidney disease: Secondary | ICD-10-CM | POA: Diagnosis not present

## 2023-03-09 DIAGNOSIS — Z466 Encounter for fitting and adjustment of urinary device: Secondary | ICD-10-CM | POA: Diagnosis not present

## 2023-03-10 DIAGNOSIS — M1712 Unilateral primary osteoarthritis, left knee: Secondary | ICD-10-CM | POA: Diagnosis not present

## 2023-03-10 DIAGNOSIS — H3530 Unspecified macular degeneration: Secondary | ICD-10-CM | POA: Diagnosis not present

## 2023-03-10 DIAGNOSIS — R32 Unspecified urinary incontinence: Secondary | ICD-10-CM | POA: Diagnosis not present

## 2023-03-10 DIAGNOSIS — R609 Edema, unspecified: Secondary | ICD-10-CM | POA: Diagnosis not present

## 2023-03-10 DIAGNOSIS — N1832 Chronic kidney disease, stage 3b: Secondary | ICD-10-CM | POA: Diagnosis not present

## 2023-03-10 DIAGNOSIS — Z1331 Encounter for screening for depression: Secondary | ICD-10-CM | POA: Diagnosis not present

## 2023-03-10 DIAGNOSIS — E559 Vitamin D deficiency, unspecified: Secondary | ICD-10-CM | POA: Diagnosis not present

## 2023-03-10 DIAGNOSIS — E7800 Pure hypercholesterolemia, unspecified: Secondary | ICD-10-CM | POA: Diagnosis not present

## 2023-03-10 DIAGNOSIS — M1611 Unilateral primary osteoarthritis, right hip: Secondary | ICD-10-CM | POA: Diagnosis not present

## 2023-03-10 DIAGNOSIS — E039 Hypothyroidism, unspecified: Secondary | ICD-10-CM | POA: Diagnosis not present

## 2023-03-10 DIAGNOSIS — Z0000 Encounter for general adult medical examination without abnormal findings: Secondary | ICD-10-CM | POA: Diagnosis not present

## 2023-03-10 DIAGNOSIS — I10 Essential (primary) hypertension: Secondary | ICD-10-CM | POA: Diagnosis not present

## 2023-03-20 DIAGNOSIS — N1832 Chronic kidney disease, stage 3b: Secondary | ICD-10-CM | POA: Diagnosis not present

## 2023-03-20 DIAGNOSIS — I5032 Chronic diastolic (congestive) heart failure: Secondary | ICD-10-CM | POA: Diagnosis not present

## 2023-03-20 DIAGNOSIS — I130 Hypertensive heart and chronic kidney disease with heart failure and stage 1 through stage 4 chronic kidney disease, or unspecified chronic kidney disease: Secondary | ICD-10-CM | POA: Diagnosis not present

## 2023-03-20 DIAGNOSIS — D631 Anemia in chronic kidney disease: Secondary | ICD-10-CM | POA: Diagnosis not present

## 2023-03-20 DIAGNOSIS — Z466 Encounter for fitting and adjustment of urinary device: Secondary | ICD-10-CM | POA: Diagnosis not present

## 2023-03-20 DIAGNOSIS — R339 Retention of urine, unspecified: Secondary | ICD-10-CM | POA: Diagnosis not present

## 2023-03-25 DIAGNOSIS — L821 Other seborrheic keratosis: Secondary | ICD-10-CM | POA: Diagnosis not present

## 2023-03-25 DIAGNOSIS — L570 Actinic keratosis: Secondary | ICD-10-CM | POA: Diagnosis not present

## 2023-03-25 DIAGNOSIS — Z808 Family history of malignant neoplasm of other organs or systems: Secondary | ICD-10-CM | POA: Diagnosis not present

## 2023-03-25 DIAGNOSIS — D2272 Melanocytic nevi of left lower limb, including hip: Secondary | ICD-10-CM | POA: Diagnosis not present

## 2023-03-25 DIAGNOSIS — Z85828 Personal history of other malignant neoplasm of skin: Secondary | ICD-10-CM | POA: Diagnosis not present

## 2023-03-25 DIAGNOSIS — D2271 Melanocytic nevi of right lower limb, including hip: Secondary | ICD-10-CM | POA: Diagnosis not present

## 2023-03-25 DIAGNOSIS — D225 Melanocytic nevi of trunk: Secondary | ICD-10-CM | POA: Diagnosis not present

## 2023-03-25 DIAGNOSIS — D224 Melanocytic nevi of scalp and neck: Secondary | ICD-10-CM | POA: Diagnosis not present

## 2023-03-25 DIAGNOSIS — L578 Other skin changes due to chronic exposure to nonionizing radiation: Secondary | ICD-10-CM | POA: Diagnosis not present

## 2023-03-25 DIAGNOSIS — D044 Carcinoma in situ of skin of scalp and neck: Secondary | ICD-10-CM | POA: Diagnosis not present

## 2023-03-25 DIAGNOSIS — D485 Neoplasm of uncertain behavior of skin: Secondary | ICD-10-CM | POA: Diagnosis not present

## 2023-03-27 DIAGNOSIS — Z23 Encounter for immunization: Secondary | ICD-10-CM | POA: Diagnosis not present

## 2023-03-27 DIAGNOSIS — E039 Hypothyroidism, unspecified: Secondary | ICD-10-CM | POA: Diagnosis not present

## 2023-03-27 DIAGNOSIS — E559 Vitamin D deficiency, unspecified: Secondary | ICD-10-CM | POA: Diagnosis not present

## 2023-03-27 DIAGNOSIS — E7800 Pure hypercholesterolemia, unspecified: Secondary | ICD-10-CM | POA: Diagnosis not present

## 2023-03-30 DIAGNOSIS — I5032 Chronic diastolic (congestive) heart failure: Secondary | ICD-10-CM | POA: Diagnosis not present

## 2023-03-30 DIAGNOSIS — R339 Retention of urine, unspecified: Secondary | ICD-10-CM | POA: Diagnosis not present

## 2023-03-30 DIAGNOSIS — I130 Hypertensive heart and chronic kidney disease with heart failure and stage 1 through stage 4 chronic kidney disease, or unspecified chronic kidney disease: Secondary | ICD-10-CM | POA: Diagnosis not present

## 2023-03-30 DIAGNOSIS — Z466 Encounter for fitting and adjustment of urinary device: Secondary | ICD-10-CM | POA: Diagnosis not present

## 2023-03-30 DIAGNOSIS — N1832 Chronic kidney disease, stage 3b: Secondary | ICD-10-CM | POA: Diagnosis not present

## 2023-03-30 DIAGNOSIS — D631 Anemia in chronic kidney disease: Secondary | ICD-10-CM | POA: Diagnosis not present

## 2023-03-31 DIAGNOSIS — Z466 Encounter for fitting and adjustment of urinary device: Secondary | ICD-10-CM | POA: Diagnosis not present

## 2023-03-31 DIAGNOSIS — N1832 Chronic kidney disease, stage 3b: Secondary | ICD-10-CM | POA: Diagnosis not present

## 2023-03-31 DIAGNOSIS — I130 Hypertensive heart and chronic kidney disease with heart failure and stage 1 through stage 4 chronic kidney disease, or unspecified chronic kidney disease: Secondary | ICD-10-CM | POA: Diagnosis not present

## 2023-03-31 DIAGNOSIS — D631 Anemia in chronic kidney disease: Secondary | ICD-10-CM | POA: Diagnosis not present

## 2023-03-31 DIAGNOSIS — R339 Retention of urine, unspecified: Secondary | ICD-10-CM | POA: Diagnosis not present

## 2023-03-31 DIAGNOSIS — I5032 Chronic diastolic (congestive) heart failure: Secondary | ICD-10-CM | POA: Diagnosis not present

## 2023-04-05 DIAGNOSIS — K5900 Constipation, unspecified: Secondary | ICD-10-CM | POA: Diagnosis not present

## 2023-04-05 DIAGNOSIS — I130 Hypertensive heart and chronic kidney disease with heart failure and stage 1 through stage 4 chronic kidney disease, or unspecified chronic kidney disease: Secondary | ICD-10-CM | POA: Diagnosis not present

## 2023-04-05 DIAGNOSIS — E039 Hypothyroidism, unspecified: Secondary | ICD-10-CM | POA: Diagnosis not present

## 2023-04-05 DIAGNOSIS — E785 Hyperlipidemia, unspecified: Secondary | ICD-10-CM | POA: Diagnosis not present

## 2023-04-05 DIAGNOSIS — I5032 Chronic diastolic (congestive) heart failure: Secondary | ICD-10-CM | POA: Diagnosis not present

## 2023-04-05 DIAGNOSIS — H409 Unspecified glaucoma: Secondary | ICD-10-CM | POA: Diagnosis not present

## 2023-04-05 DIAGNOSIS — M1990 Unspecified osteoarthritis, unspecified site: Secondary | ICD-10-CM | POA: Diagnosis not present

## 2023-04-05 DIAGNOSIS — R351 Nocturia: Secondary | ICD-10-CM | POA: Diagnosis not present

## 2023-04-05 DIAGNOSIS — R339 Retention of urine, unspecified: Secondary | ICD-10-CM | POA: Diagnosis not present

## 2023-04-05 DIAGNOSIS — D631 Anemia in chronic kidney disease: Secondary | ICD-10-CM | POA: Diagnosis not present

## 2023-04-05 DIAGNOSIS — N3946 Mixed incontinence: Secondary | ICD-10-CM | POA: Diagnosis not present

## 2023-04-05 DIAGNOSIS — N1832 Chronic kidney disease, stage 3b: Secondary | ICD-10-CM | POA: Diagnosis not present

## 2023-04-05 DIAGNOSIS — Z87440 Personal history of urinary (tract) infections: Secondary | ICD-10-CM | POA: Diagnosis not present

## 2023-04-05 DIAGNOSIS — Z466 Encounter for fitting and adjustment of urinary device: Secondary | ICD-10-CM | POA: Diagnosis not present

## 2023-04-05 DIAGNOSIS — Z8701 Personal history of pneumonia (recurrent): Secondary | ICD-10-CM | POA: Diagnosis not present

## 2023-04-15 DIAGNOSIS — C4492 Squamous cell carcinoma of skin, unspecified: Secondary | ICD-10-CM | POA: Diagnosis not present

## 2023-04-15 DIAGNOSIS — C4442 Squamous cell carcinoma of skin of scalp and neck: Secondary | ICD-10-CM | POA: Diagnosis not present

## 2023-04-16 DIAGNOSIS — Z23 Encounter for immunization: Secondary | ICD-10-CM | POA: Diagnosis not present

## 2023-04-16 DIAGNOSIS — D631 Anemia in chronic kidney disease: Secondary | ICD-10-CM | POA: Diagnosis not present

## 2023-04-16 DIAGNOSIS — N1832 Chronic kidney disease, stage 3b: Secondary | ICD-10-CM | POA: Diagnosis not present

## 2023-04-16 DIAGNOSIS — Z466 Encounter for fitting and adjustment of urinary device: Secondary | ICD-10-CM | POA: Diagnosis not present

## 2023-04-16 DIAGNOSIS — I130 Hypertensive heart and chronic kidney disease with heart failure and stage 1 through stage 4 chronic kidney disease, or unspecified chronic kidney disease: Secondary | ICD-10-CM | POA: Diagnosis not present

## 2023-04-16 DIAGNOSIS — I5032 Chronic diastolic (congestive) heart failure: Secondary | ICD-10-CM | POA: Diagnosis not present

## 2023-04-16 DIAGNOSIS — R339 Retention of urine, unspecified: Secondary | ICD-10-CM | POA: Diagnosis not present

## 2023-04-30 DIAGNOSIS — N1832 Chronic kidney disease, stage 3b: Secondary | ICD-10-CM | POA: Diagnosis not present

## 2023-04-30 DIAGNOSIS — Z466 Encounter for fitting and adjustment of urinary device: Secondary | ICD-10-CM | POA: Diagnosis not present

## 2023-04-30 DIAGNOSIS — I5032 Chronic diastolic (congestive) heart failure: Secondary | ICD-10-CM | POA: Diagnosis not present

## 2023-04-30 DIAGNOSIS — I130 Hypertensive heart and chronic kidney disease with heart failure and stage 1 through stage 4 chronic kidney disease, or unspecified chronic kidney disease: Secondary | ICD-10-CM | POA: Diagnosis not present

## 2023-04-30 DIAGNOSIS — D631 Anemia in chronic kidney disease: Secondary | ICD-10-CM | POA: Diagnosis not present

## 2023-04-30 DIAGNOSIS — R339 Retention of urine, unspecified: Secondary | ICD-10-CM | POA: Diagnosis not present

## 2023-05-05 DIAGNOSIS — E039 Hypothyroidism, unspecified: Secondary | ICD-10-CM | POA: Diagnosis not present

## 2023-05-05 DIAGNOSIS — K5900 Constipation, unspecified: Secondary | ICD-10-CM | POA: Diagnosis not present

## 2023-05-05 DIAGNOSIS — M1990 Unspecified osteoarthritis, unspecified site: Secondary | ICD-10-CM | POA: Diagnosis not present

## 2023-05-05 DIAGNOSIS — I5032 Chronic diastolic (congestive) heart failure: Secondary | ICD-10-CM | POA: Diagnosis not present

## 2023-05-05 DIAGNOSIS — Z87440 Personal history of urinary (tract) infections: Secondary | ICD-10-CM | POA: Diagnosis not present

## 2023-05-05 DIAGNOSIS — Z466 Encounter for fitting and adjustment of urinary device: Secondary | ICD-10-CM | POA: Diagnosis not present

## 2023-05-05 DIAGNOSIS — Z8701 Personal history of pneumonia (recurrent): Secondary | ICD-10-CM | POA: Diagnosis not present

## 2023-05-05 DIAGNOSIS — E785 Hyperlipidemia, unspecified: Secondary | ICD-10-CM | POA: Diagnosis not present

## 2023-05-05 DIAGNOSIS — H409 Unspecified glaucoma: Secondary | ICD-10-CM | POA: Diagnosis not present

## 2023-05-05 DIAGNOSIS — I130 Hypertensive heart and chronic kidney disease with heart failure and stage 1 through stage 4 chronic kidney disease, or unspecified chronic kidney disease: Secondary | ICD-10-CM | POA: Diagnosis not present

## 2023-05-05 DIAGNOSIS — D631 Anemia in chronic kidney disease: Secondary | ICD-10-CM | POA: Diagnosis not present

## 2023-05-05 DIAGNOSIS — N1832 Chronic kidney disease, stage 3b: Secondary | ICD-10-CM | POA: Diagnosis not present

## 2023-05-14 DIAGNOSIS — Z466 Encounter for fitting and adjustment of urinary device: Secondary | ICD-10-CM | POA: Diagnosis not present

## 2023-05-14 DIAGNOSIS — I130 Hypertensive heart and chronic kidney disease with heart failure and stage 1 through stage 4 chronic kidney disease, or unspecified chronic kidney disease: Secondary | ICD-10-CM | POA: Diagnosis not present

## 2023-05-14 DIAGNOSIS — I5032 Chronic diastolic (congestive) heart failure: Secondary | ICD-10-CM | POA: Diagnosis not present

## 2023-05-14 DIAGNOSIS — D631 Anemia in chronic kidney disease: Secondary | ICD-10-CM | POA: Diagnosis not present

## 2023-05-14 DIAGNOSIS — M1990 Unspecified osteoarthritis, unspecified site: Secondary | ICD-10-CM | POA: Diagnosis not present

## 2023-05-14 DIAGNOSIS — N1832 Chronic kidney disease, stage 3b: Secondary | ICD-10-CM | POA: Diagnosis not present

## 2023-05-20 ENCOUNTER — Encounter: Payer: Self-pay | Admitting: Podiatry

## 2023-05-20 ENCOUNTER — Ambulatory Visit (INDEPENDENT_AMBULATORY_CARE_PROVIDER_SITE_OTHER): Payer: Medicare Other | Admitting: Podiatry

## 2023-05-20 DIAGNOSIS — M79675 Pain in left toe(s): Secondary | ICD-10-CM

## 2023-05-20 DIAGNOSIS — T1490XA Injury, unspecified, initial encounter: Secondary | ICD-10-CM | POA: Insufficient documentation

## 2023-05-20 DIAGNOSIS — B351 Tinea unguium: Secondary | ICD-10-CM | POA: Diagnosis not present

## 2023-05-20 DIAGNOSIS — M79674 Pain in right toe(s): Secondary | ICD-10-CM

## 2023-05-20 DIAGNOSIS — D689 Coagulation defect, unspecified: Secondary | ICD-10-CM

## 2023-05-20 NOTE — Progress Notes (Signed)
This patient returns to my office for at risk foot care.  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  This patient is unable to cut nails herself since the patient cannot reach her nails.  She says she injured her 3.4 toes left foot this morning.  This patient presents for at risk foot care today.   General Appearance  Alert, conversant and in no acute stress.  Vascular  Dorsalis pedis and posterior tibial  pulses are  weakly palpable  bilaterally.  Capillary return is within normal limits  bilaterally. Temperature is within normal limits  bilaterally.  Neurologic  Senn-Weinstein monofilament wire test within normal limits  bilaterally. Muscle power within normal limits bilaterally.  Nails Thick disfigured discolored nails with subungual debris  from hallux to fifth toes bilaterally. No evidence of bacterial infection or drainage bilaterally.    Orthopedic  No limitations of motion  feet .  No crepitus or effusions noted.  No bony pathology or digital deformities noted.  Skin  normotropic skin with no porokeratosis noted bilaterally.  No signs of infections or ulcers noted.     Onychomycosis  Pain in right toes  Pain in left toes  Tissue injury 3,4 toe left foot.  Consent was obtained for treatment procedures.   Mechanical debridement of nails 1-5  bilaterally performed with a nail nipper.  Filed with dremel without incident.    Return office visit   3 months.                  Told patient to return for periodic foot care and evaluation due to potential at risk complications.  Bandage neosporin/DSD applied to 3,4 toes left foot.   Helane Gunther DPM

## 2023-05-26 ENCOUNTER — Ambulatory Visit: Payer: Medicare Other | Admitting: Podiatry

## 2023-05-27 ENCOUNTER — Encounter (INDEPENDENT_AMBULATORY_CARE_PROVIDER_SITE_OTHER): Payer: Medicare Other | Admitting: Ophthalmology

## 2023-05-27 DIAGNOSIS — H35033 Hypertensive retinopathy, bilateral: Secondary | ICD-10-CM | POA: Diagnosis not present

## 2023-05-27 DIAGNOSIS — I10 Essential (primary) hypertension: Secondary | ICD-10-CM

## 2023-05-27 DIAGNOSIS — H353231 Exudative age-related macular degeneration, bilateral, with active choroidal neovascularization: Secondary | ICD-10-CM | POA: Diagnosis not present

## 2023-05-27 DIAGNOSIS — H43813 Vitreous degeneration, bilateral: Secondary | ICD-10-CM | POA: Diagnosis not present

## 2023-05-29 DIAGNOSIS — D631 Anemia in chronic kidney disease: Secondary | ICD-10-CM | POA: Diagnosis not present

## 2023-05-29 DIAGNOSIS — Z466 Encounter for fitting and adjustment of urinary device: Secondary | ICD-10-CM | POA: Diagnosis not present

## 2023-05-29 DIAGNOSIS — M1990 Unspecified osteoarthritis, unspecified site: Secondary | ICD-10-CM | POA: Diagnosis not present

## 2023-05-29 DIAGNOSIS — N1832 Chronic kidney disease, stage 3b: Secondary | ICD-10-CM | POA: Diagnosis not present

## 2023-05-29 DIAGNOSIS — I5032 Chronic diastolic (congestive) heart failure: Secondary | ICD-10-CM | POA: Diagnosis not present

## 2023-05-29 DIAGNOSIS — I130 Hypertensive heart and chronic kidney disease with heart failure and stage 1 through stage 4 chronic kidney disease, or unspecified chronic kidney disease: Secondary | ICD-10-CM | POA: Diagnosis not present

## 2023-06-04 DIAGNOSIS — I5032 Chronic diastolic (congestive) heart failure: Secondary | ICD-10-CM | POA: Diagnosis not present

## 2023-06-04 DIAGNOSIS — Z87440 Personal history of urinary (tract) infections: Secondary | ICD-10-CM | POA: Diagnosis not present

## 2023-06-04 DIAGNOSIS — H409 Unspecified glaucoma: Secondary | ICD-10-CM | POA: Diagnosis not present

## 2023-06-04 DIAGNOSIS — Z466 Encounter for fitting and adjustment of urinary device: Secondary | ICD-10-CM | POA: Diagnosis not present

## 2023-06-04 DIAGNOSIS — M1990 Unspecified osteoarthritis, unspecified site: Secondary | ICD-10-CM | POA: Diagnosis not present

## 2023-06-04 DIAGNOSIS — E785 Hyperlipidemia, unspecified: Secondary | ICD-10-CM | POA: Diagnosis not present

## 2023-06-04 DIAGNOSIS — Z8701 Personal history of pneumonia (recurrent): Secondary | ICD-10-CM | POA: Diagnosis not present

## 2023-06-04 DIAGNOSIS — E039 Hypothyroidism, unspecified: Secondary | ICD-10-CM | POA: Diagnosis not present

## 2023-06-04 DIAGNOSIS — D631 Anemia in chronic kidney disease: Secondary | ICD-10-CM | POA: Diagnosis not present

## 2023-06-04 DIAGNOSIS — N1832 Chronic kidney disease, stage 3b: Secondary | ICD-10-CM | POA: Diagnosis not present

## 2023-06-04 DIAGNOSIS — K5900 Constipation, unspecified: Secondary | ICD-10-CM | POA: Diagnosis not present

## 2023-06-04 DIAGNOSIS — I130 Hypertensive heart and chronic kidney disease with heart failure and stage 1 through stage 4 chronic kidney disease, or unspecified chronic kidney disease: Secondary | ICD-10-CM | POA: Diagnosis not present

## 2023-06-23 DIAGNOSIS — I5032 Chronic diastolic (congestive) heart failure: Secondary | ICD-10-CM | POA: Diagnosis not present

## 2023-06-23 DIAGNOSIS — N1832 Chronic kidney disease, stage 3b: Secondary | ICD-10-CM | POA: Diagnosis not present

## 2023-06-23 DIAGNOSIS — I130 Hypertensive heart and chronic kidney disease with heart failure and stage 1 through stage 4 chronic kidney disease, or unspecified chronic kidney disease: Secondary | ICD-10-CM | POA: Diagnosis not present

## 2023-06-23 DIAGNOSIS — D631 Anemia in chronic kidney disease: Secondary | ICD-10-CM | POA: Diagnosis not present

## 2023-06-23 DIAGNOSIS — Z466 Encounter for fitting and adjustment of urinary device: Secondary | ICD-10-CM | POA: Diagnosis not present

## 2023-06-23 DIAGNOSIS — M1990 Unspecified osteoarthritis, unspecified site: Secondary | ICD-10-CM | POA: Diagnosis not present

## 2023-07-02 DIAGNOSIS — Z466 Encounter for fitting and adjustment of urinary device: Secondary | ICD-10-CM | POA: Diagnosis not present

## 2023-07-02 DIAGNOSIS — I130 Hypertensive heart and chronic kidney disease with heart failure and stage 1 through stage 4 chronic kidney disease, or unspecified chronic kidney disease: Secondary | ICD-10-CM | POA: Diagnosis not present

## 2023-07-02 DIAGNOSIS — D631 Anemia in chronic kidney disease: Secondary | ICD-10-CM | POA: Diagnosis not present

## 2023-07-02 DIAGNOSIS — I5032 Chronic diastolic (congestive) heart failure: Secondary | ICD-10-CM | POA: Diagnosis not present

## 2023-07-02 DIAGNOSIS — M1990 Unspecified osteoarthritis, unspecified site: Secondary | ICD-10-CM | POA: Diagnosis not present

## 2023-07-02 DIAGNOSIS — N1832 Chronic kidney disease, stage 3b: Secondary | ICD-10-CM | POA: Diagnosis not present

## 2023-07-04 DIAGNOSIS — I130 Hypertensive heart and chronic kidney disease with heart failure and stage 1 through stage 4 chronic kidney disease, or unspecified chronic kidney disease: Secondary | ICD-10-CM | POA: Diagnosis not present

## 2023-07-04 DIAGNOSIS — E039 Hypothyroidism, unspecified: Secondary | ICD-10-CM | POA: Diagnosis not present

## 2023-07-04 DIAGNOSIS — I5032 Chronic diastolic (congestive) heart failure: Secondary | ICD-10-CM | POA: Diagnosis not present

## 2023-07-04 DIAGNOSIS — D631 Anemia in chronic kidney disease: Secondary | ICD-10-CM | POA: Diagnosis not present

## 2023-07-04 DIAGNOSIS — N1832 Chronic kidney disease, stage 3b: Secondary | ICD-10-CM | POA: Diagnosis not present

## 2023-07-04 DIAGNOSIS — K5900 Constipation, unspecified: Secondary | ICD-10-CM | POA: Diagnosis not present

## 2023-07-04 DIAGNOSIS — Z8701 Personal history of pneumonia (recurrent): Secondary | ICD-10-CM | POA: Diagnosis not present

## 2023-07-04 DIAGNOSIS — Z466 Encounter for fitting and adjustment of urinary device: Secondary | ICD-10-CM | POA: Diagnosis not present

## 2023-07-04 DIAGNOSIS — H409 Unspecified glaucoma: Secondary | ICD-10-CM | POA: Diagnosis not present

## 2023-07-04 DIAGNOSIS — M1990 Unspecified osteoarthritis, unspecified site: Secondary | ICD-10-CM | POA: Diagnosis not present

## 2023-07-04 DIAGNOSIS — Z87440 Personal history of urinary (tract) infections: Secondary | ICD-10-CM | POA: Diagnosis not present

## 2023-07-04 DIAGNOSIS — E785 Hyperlipidemia, unspecified: Secondary | ICD-10-CM | POA: Diagnosis not present

## 2023-07-21 DIAGNOSIS — Z466 Encounter for fitting and adjustment of urinary device: Secondary | ICD-10-CM | POA: Diagnosis not present

## 2023-07-21 DIAGNOSIS — D631 Anemia in chronic kidney disease: Secondary | ICD-10-CM | POA: Diagnosis not present

## 2023-07-21 DIAGNOSIS — M1990 Unspecified osteoarthritis, unspecified site: Secondary | ICD-10-CM | POA: Diagnosis not present

## 2023-07-21 DIAGNOSIS — I5032 Chronic diastolic (congestive) heart failure: Secondary | ICD-10-CM | POA: Diagnosis not present

## 2023-07-21 DIAGNOSIS — I130 Hypertensive heart and chronic kidney disease with heart failure and stage 1 through stage 4 chronic kidney disease, or unspecified chronic kidney disease: Secondary | ICD-10-CM | POA: Diagnosis not present

## 2023-07-21 DIAGNOSIS — N1832 Chronic kidney disease, stage 3b: Secondary | ICD-10-CM | POA: Diagnosis not present

## 2023-08-03 DIAGNOSIS — I130 Hypertensive heart and chronic kidney disease with heart failure and stage 1 through stage 4 chronic kidney disease, or unspecified chronic kidney disease: Secondary | ICD-10-CM | POA: Diagnosis not present

## 2023-08-03 DIAGNOSIS — E785 Hyperlipidemia, unspecified: Secondary | ICD-10-CM | POA: Diagnosis not present

## 2023-08-03 DIAGNOSIS — Z87440 Personal history of urinary (tract) infections: Secondary | ICD-10-CM | POA: Diagnosis not present

## 2023-08-03 DIAGNOSIS — H409 Unspecified glaucoma: Secondary | ICD-10-CM | POA: Diagnosis not present

## 2023-08-03 DIAGNOSIS — Z466 Encounter for fitting and adjustment of urinary device: Secondary | ICD-10-CM | POA: Diagnosis not present

## 2023-08-03 DIAGNOSIS — K5900 Constipation, unspecified: Secondary | ICD-10-CM | POA: Diagnosis not present

## 2023-08-03 DIAGNOSIS — Z8701 Personal history of pneumonia (recurrent): Secondary | ICD-10-CM | POA: Diagnosis not present

## 2023-08-03 DIAGNOSIS — N1832 Chronic kidney disease, stage 3b: Secondary | ICD-10-CM | POA: Diagnosis not present

## 2023-08-03 DIAGNOSIS — D631 Anemia in chronic kidney disease: Secondary | ICD-10-CM | POA: Diagnosis not present

## 2023-08-03 DIAGNOSIS — M1990 Unspecified osteoarthritis, unspecified site: Secondary | ICD-10-CM | POA: Diagnosis not present

## 2023-08-03 DIAGNOSIS — I5032 Chronic diastolic (congestive) heart failure: Secondary | ICD-10-CM | POA: Diagnosis not present

## 2023-08-03 DIAGNOSIS — E039 Hypothyroidism, unspecified: Secondary | ICD-10-CM | POA: Diagnosis not present

## 2023-08-20 ENCOUNTER — Encounter: Payer: Self-pay | Admitting: Podiatry

## 2023-08-20 ENCOUNTER — Ambulatory Visit (INDEPENDENT_AMBULATORY_CARE_PROVIDER_SITE_OTHER): Payer: Medicare Other | Admitting: Podiatry

## 2023-08-20 DIAGNOSIS — M79674 Pain in right toe(s): Secondary | ICD-10-CM | POA: Diagnosis not present

## 2023-08-20 DIAGNOSIS — M79675 Pain in left toe(s): Secondary | ICD-10-CM

## 2023-08-20 DIAGNOSIS — D689 Coagulation defect, unspecified: Secondary | ICD-10-CM | POA: Diagnosis not present

## 2023-08-20 DIAGNOSIS — B351 Tinea unguium: Secondary | ICD-10-CM | POA: Diagnosis not present

## 2023-08-20 NOTE — Progress Notes (Signed)
This patient returns to my office for at risk foot care.  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  This patient is unable to cut nails herself since the patient cannot reach her nails.  She says she injured her 3.4 toes left foot this morning.  This patient presents for at risk foot care today.   General Appearance  Alert, conversant and in no acute stress.  Vascular  Dorsalis pedis and posterior tibial  pulses are  weakly palpable  bilaterally.  Capillary return is within normal limits  bilaterally. Temperature is within normal limits  bilaterally.  Neurologic  Senn-Weinstein monofilament wire test within normal limits  bilaterally. Muscle power within normal limits bilaterally.  Nails Thick disfigured discolored nails with subungual debris  from hallux to fifth toes bilaterally. No evidence of bacterial infection or drainage bilaterally.    Orthopedic  No limitations of motion  feet .  No crepitus or effusions noted.  No bony pathology or digital deformities noted.  Skin  normotropic skin with no porokeratosis noted bilaterally.  No signs of infections or ulcers noted.     Onychomycosis  Pain in right toes  Pain in left toes  Tissue injury 3,4 toe left foot.  Consent was obtained for treatment procedures.   Mechanical debridement of nails 1-5  bilaterally performed with a nail nipper.  Filed with dremel without incident.    Return office visit   3 months.                  Told patient to return for periodic foot care and evaluation due to potential at risk complications.     Helane Gunther DPM

## 2023-08-24 DIAGNOSIS — I130 Hypertensive heart and chronic kidney disease with heart failure and stage 1 through stage 4 chronic kidney disease, or unspecified chronic kidney disease: Secondary | ICD-10-CM | POA: Diagnosis not present

## 2023-08-24 DIAGNOSIS — Z466 Encounter for fitting and adjustment of urinary device: Secondary | ICD-10-CM | POA: Diagnosis not present

## 2023-08-24 DIAGNOSIS — M1990 Unspecified osteoarthritis, unspecified site: Secondary | ICD-10-CM | POA: Diagnosis not present

## 2023-08-24 DIAGNOSIS — I5032 Chronic diastolic (congestive) heart failure: Secondary | ICD-10-CM | POA: Diagnosis not present

## 2023-08-24 DIAGNOSIS — N1832 Chronic kidney disease, stage 3b: Secondary | ICD-10-CM | POA: Diagnosis not present

## 2023-08-24 DIAGNOSIS — D631 Anemia in chronic kidney disease: Secondary | ICD-10-CM | POA: Diagnosis not present

## 2023-08-31 DIAGNOSIS — N1832 Chronic kidney disease, stage 3b: Secondary | ICD-10-CM | POA: Diagnosis not present

## 2023-08-31 DIAGNOSIS — D631 Anemia in chronic kidney disease: Secondary | ICD-10-CM | POA: Diagnosis not present

## 2023-08-31 DIAGNOSIS — I5032 Chronic diastolic (congestive) heart failure: Secondary | ICD-10-CM | POA: Diagnosis not present

## 2023-08-31 DIAGNOSIS — I130 Hypertensive heart and chronic kidney disease with heart failure and stage 1 through stage 4 chronic kidney disease, or unspecified chronic kidney disease: Secondary | ICD-10-CM | POA: Diagnosis not present

## 2023-08-31 DIAGNOSIS — M1990 Unspecified osteoarthritis, unspecified site: Secondary | ICD-10-CM | POA: Diagnosis not present

## 2023-08-31 DIAGNOSIS — Z466 Encounter for fitting and adjustment of urinary device: Secondary | ICD-10-CM | POA: Diagnosis not present

## 2023-09-02 ENCOUNTER — Encounter (INDEPENDENT_AMBULATORY_CARE_PROVIDER_SITE_OTHER): Payer: Medicare Other | Admitting: Ophthalmology

## 2023-09-02 DIAGNOSIS — H35033 Hypertensive retinopathy, bilateral: Secondary | ICD-10-CM

## 2023-09-02 DIAGNOSIS — E785 Hyperlipidemia, unspecified: Secondary | ICD-10-CM | POA: Diagnosis not present

## 2023-09-02 DIAGNOSIS — D631 Anemia in chronic kidney disease: Secondary | ICD-10-CM | POA: Diagnosis not present

## 2023-09-02 DIAGNOSIS — H353231 Exudative age-related macular degeneration, bilateral, with active choroidal neovascularization: Secondary | ICD-10-CM | POA: Diagnosis not present

## 2023-09-02 DIAGNOSIS — I10 Essential (primary) hypertension: Secondary | ICD-10-CM | POA: Diagnosis not present

## 2023-09-02 DIAGNOSIS — H409 Unspecified glaucoma: Secondary | ICD-10-CM | POA: Diagnosis not present

## 2023-09-02 DIAGNOSIS — Z87440 Personal history of urinary (tract) infections: Secondary | ICD-10-CM | POA: Diagnosis not present

## 2023-09-02 DIAGNOSIS — K5900 Constipation, unspecified: Secondary | ICD-10-CM | POA: Diagnosis not present

## 2023-09-02 DIAGNOSIS — Z466 Encounter for fitting and adjustment of urinary device: Secondary | ICD-10-CM | POA: Diagnosis not present

## 2023-09-02 DIAGNOSIS — I5032 Chronic diastolic (congestive) heart failure: Secondary | ICD-10-CM | POA: Diagnosis not present

## 2023-09-02 DIAGNOSIS — H43813 Vitreous degeneration, bilateral: Secondary | ICD-10-CM | POA: Diagnosis not present

## 2023-09-02 DIAGNOSIS — N1832 Chronic kidney disease, stage 3b: Secondary | ICD-10-CM | POA: Diagnosis not present

## 2023-09-02 DIAGNOSIS — Z8701 Personal history of pneumonia (recurrent): Secondary | ICD-10-CM | POA: Diagnosis not present

## 2023-09-02 DIAGNOSIS — M1990 Unspecified osteoarthritis, unspecified site: Secondary | ICD-10-CM | POA: Diagnosis not present

## 2023-09-02 DIAGNOSIS — E039 Hypothyroidism, unspecified: Secondary | ICD-10-CM | POA: Diagnosis not present

## 2023-09-02 DIAGNOSIS — I130 Hypertensive heart and chronic kidney disease with heart failure and stage 1 through stage 4 chronic kidney disease, or unspecified chronic kidney disease: Secondary | ICD-10-CM | POA: Diagnosis not present

## 2023-09-10 DIAGNOSIS — D649 Anemia, unspecified: Secondary | ICD-10-CM | POA: Diagnosis not present

## 2023-09-10 DIAGNOSIS — I10 Essential (primary) hypertension: Secondary | ICD-10-CM | POA: Diagnosis not present

## 2023-09-10 DIAGNOSIS — M1712 Unilateral primary osteoarthritis, left knee: Secondary | ICD-10-CM | POA: Diagnosis not present

## 2023-09-10 DIAGNOSIS — H3530 Unspecified macular degeneration: Secondary | ICD-10-CM | POA: Diagnosis not present

## 2023-09-10 DIAGNOSIS — E039 Hypothyroidism, unspecified: Secondary | ICD-10-CM | POA: Diagnosis not present

## 2023-09-10 DIAGNOSIS — E7800 Pure hypercholesterolemia, unspecified: Secondary | ICD-10-CM | POA: Diagnosis not present

## 2023-09-10 DIAGNOSIS — M1611 Unilateral primary osteoarthritis, right hip: Secondary | ICD-10-CM | POA: Diagnosis not present

## 2023-09-10 DIAGNOSIS — R609 Edema, unspecified: Secondary | ICD-10-CM | POA: Diagnosis not present

## 2023-09-10 DIAGNOSIS — N1832 Chronic kidney disease, stage 3b: Secondary | ICD-10-CM | POA: Diagnosis not present

## 2023-09-10 DIAGNOSIS — E559 Vitamin D deficiency, unspecified: Secondary | ICD-10-CM | POA: Diagnosis not present

## 2023-09-10 DIAGNOSIS — R32 Unspecified urinary incontinence: Secondary | ICD-10-CM | POA: Diagnosis not present

## 2023-09-21 DIAGNOSIS — M1990 Unspecified osteoarthritis, unspecified site: Secondary | ICD-10-CM | POA: Diagnosis not present

## 2023-09-21 DIAGNOSIS — I130 Hypertensive heart and chronic kidney disease with heart failure and stage 1 through stage 4 chronic kidney disease, or unspecified chronic kidney disease: Secondary | ICD-10-CM | POA: Diagnosis not present

## 2023-09-21 DIAGNOSIS — Z466 Encounter for fitting and adjustment of urinary device: Secondary | ICD-10-CM | POA: Diagnosis not present

## 2023-09-21 DIAGNOSIS — I5032 Chronic diastolic (congestive) heart failure: Secondary | ICD-10-CM | POA: Diagnosis not present

## 2023-09-21 DIAGNOSIS — D631 Anemia in chronic kidney disease: Secondary | ICD-10-CM | POA: Diagnosis not present

## 2023-09-21 DIAGNOSIS — N1832 Chronic kidney disease, stage 3b: Secondary | ICD-10-CM | POA: Diagnosis not present

## 2023-10-02 DIAGNOSIS — Z466 Encounter for fitting and adjustment of urinary device: Secondary | ICD-10-CM | POA: Diagnosis not present

## 2023-10-02 DIAGNOSIS — Z8701 Personal history of pneumonia (recurrent): Secondary | ICD-10-CM | POA: Diagnosis not present

## 2023-10-02 DIAGNOSIS — E039 Hypothyroidism, unspecified: Secondary | ICD-10-CM | POA: Diagnosis not present

## 2023-10-02 DIAGNOSIS — M1990 Unspecified osteoarthritis, unspecified site: Secondary | ICD-10-CM | POA: Diagnosis not present

## 2023-10-02 DIAGNOSIS — K5900 Constipation, unspecified: Secondary | ICD-10-CM | POA: Diagnosis not present

## 2023-10-02 DIAGNOSIS — E785 Hyperlipidemia, unspecified: Secondary | ICD-10-CM | POA: Diagnosis not present

## 2023-10-02 DIAGNOSIS — Z87440 Personal history of urinary (tract) infections: Secondary | ICD-10-CM | POA: Diagnosis not present

## 2023-10-02 DIAGNOSIS — I5032 Chronic diastolic (congestive) heart failure: Secondary | ICD-10-CM | POA: Diagnosis not present

## 2023-10-02 DIAGNOSIS — I130 Hypertensive heart and chronic kidney disease with heart failure and stage 1 through stage 4 chronic kidney disease, or unspecified chronic kidney disease: Secondary | ICD-10-CM | POA: Diagnosis not present

## 2023-10-02 DIAGNOSIS — N1832 Chronic kidney disease, stage 3b: Secondary | ICD-10-CM | POA: Diagnosis not present

## 2023-10-02 DIAGNOSIS — H409 Unspecified glaucoma: Secondary | ICD-10-CM | POA: Diagnosis not present

## 2023-10-02 DIAGNOSIS — D631 Anemia in chronic kidney disease: Secondary | ICD-10-CM | POA: Diagnosis not present

## 2023-10-08 DIAGNOSIS — B880 Other acariasis: Secondary | ICD-10-CM | POA: Diagnosis not present

## 2023-10-08 DIAGNOSIS — H01004 Unspecified blepharitis left upper eyelid: Secondary | ICD-10-CM | POA: Diagnosis not present

## 2023-10-08 DIAGNOSIS — H01001 Unspecified blepharitis right upper eyelid: Secondary | ICD-10-CM | POA: Diagnosis not present

## 2023-10-08 DIAGNOSIS — H40023 Open angle with borderline findings, high risk, bilateral: Secondary | ICD-10-CM | POA: Diagnosis not present

## 2023-10-08 DIAGNOSIS — H40052 Ocular hypertension, left eye: Secondary | ICD-10-CM | POA: Diagnosis not present

## 2023-10-22 DIAGNOSIS — M1990 Unspecified osteoarthritis, unspecified site: Secondary | ICD-10-CM | POA: Diagnosis not present

## 2023-10-22 DIAGNOSIS — I130 Hypertensive heart and chronic kidney disease with heart failure and stage 1 through stage 4 chronic kidney disease, or unspecified chronic kidney disease: Secondary | ICD-10-CM | POA: Diagnosis not present

## 2023-10-22 DIAGNOSIS — I5032 Chronic diastolic (congestive) heart failure: Secondary | ICD-10-CM | POA: Diagnosis not present

## 2023-10-22 DIAGNOSIS — N1832 Chronic kidney disease, stage 3b: Secondary | ICD-10-CM | POA: Diagnosis not present

## 2023-10-22 DIAGNOSIS — Z466 Encounter for fitting and adjustment of urinary device: Secondary | ICD-10-CM | POA: Diagnosis not present

## 2023-10-22 DIAGNOSIS — D631 Anemia in chronic kidney disease: Secondary | ICD-10-CM | POA: Diagnosis not present

## 2023-10-28 DIAGNOSIS — D631 Anemia in chronic kidney disease: Secondary | ICD-10-CM | POA: Diagnosis not present

## 2023-10-28 DIAGNOSIS — I5032 Chronic diastolic (congestive) heart failure: Secondary | ICD-10-CM | POA: Diagnosis not present

## 2023-10-28 DIAGNOSIS — M1990 Unspecified osteoarthritis, unspecified site: Secondary | ICD-10-CM | POA: Diagnosis not present

## 2023-10-28 DIAGNOSIS — I130 Hypertensive heart and chronic kidney disease with heart failure and stage 1 through stage 4 chronic kidney disease, or unspecified chronic kidney disease: Secondary | ICD-10-CM | POA: Diagnosis not present

## 2023-10-28 DIAGNOSIS — N1832 Chronic kidney disease, stage 3b: Secondary | ICD-10-CM | POA: Diagnosis not present

## 2023-10-28 DIAGNOSIS — Z466 Encounter for fitting and adjustment of urinary device: Secondary | ICD-10-CM | POA: Diagnosis not present

## 2023-11-01 DIAGNOSIS — I5032 Chronic diastolic (congestive) heart failure: Secondary | ICD-10-CM | POA: Diagnosis not present

## 2023-11-01 DIAGNOSIS — H409 Unspecified glaucoma: Secondary | ICD-10-CM | POA: Diagnosis not present

## 2023-11-01 DIAGNOSIS — Z8701 Personal history of pneumonia (recurrent): Secondary | ICD-10-CM | POA: Diagnosis not present

## 2023-11-01 DIAGNOSIS — Z87440 Personal history of urinary (tract) infections: Secondary | ICD-10-CM | POA: Diagnosis not present

## 2023-11-01 DIAGNOSIS — E039 Hypothyroidism, unspecified: Secondary | ICD-10-CM | POA: Diagnosis not present

## 2023-11-01 DIAGNOSIS — K5900 Constipation, unspecified: Secondary | ICD-10-CM | POA: Diagnosis not present

## 2023-11-01 DIAGNOSIS — N1832 Chronic kidney disease, stage 3b: Secondary | ICD-10-CM | POA: Diagnosis not present

## 2023-11-01 DIAGNOSIS — I130 Hypertensive heart and chronic kidney disease with heart failure and stage 1 through stage 4 chronic kidney disease, or unspecified chronic kidney disease: Secondary | ICD-10-CM | POA: Diagnosis not present

## 2023-11-01 DIAGNOSIS — M1990 Unspecified osteoarthritis, unspecified site: Secondary | ICD-10-CM | POA: Diagnosis not present

## 2023-11-01 DIAGNOSIS — D631 Anemia in chronic kidney disease: Secondary | ICD-10-CM | POA: Diagnosis not present

## 2023-11-01 DIAGNOSIS — E785 Hyperlipidemia, unspecified: Secondary | ICD-10-CM | POA: Diagnosis not present

## 2023-11-01 DIAGNOSIS — Z466 Encounter for fitting and adjustment of urinary device: Secondary | ICD-10-CM | POA: Diagnosis not present

## 2023-11-18 ENCOUNTER — Encounter: Payer: Self-pay | Admitting: Podiatry

## 2023-11-18 ENCOUNTER — Ambulatory Visit (INDEPENDENT_AMBULATORY_CARE_PROVIDER_SITE_OTHER): Payer: Medicare Other | Admitting: Podiatry

## 2023-11-18 DIAGNOSIS — D689 Coagulation defect, unspecified: Secondary | ICD-10-CM

## 2023-11-18 DIAGNOSIS — M79675 Pain in left toe(s): Secondary | ICD-10-CM | POA: Diagnosis not present

## 2023-11-18 DIAGNOSIS — N1832 Chronic kidney disease, stage 3b: Secondary | ICD-10-CM | POA: Diagnosis not present

## 2023-11-18 DIAGNOSIS — M79674 Pain in right toe(s): Secondary | ICD-10-CM | POA: Diagnosis not present

## 2023-11-18 DIAGNOSIS — B351 Tinea unguium: Secondary | ICD-10-CM | POA: Diagnosis not present

## 2023-11-18 DIAGNOSIS — M1611 Unilateral primary osteoarthritis, right hip: Secondary | ICD-10-CM | POA: Diagnosis not present

## 2023-11-18 DIAGNOSIS — I10 Essential (primary) hypertension: Secondary | ICD-10-CM | POA: Diagnosis not present

## 2023-11-18 DIAGNOSIS — H409 Unspecified glaucoma: Secondary | ICD-10-CM | POA: Diagnosis not present

## 2023-11-18 NOTE — Progress Notes (Signed)
 This patient returns to my office for at risk foot care.  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  This patient is unable to cut nails herself since the patient cannot reach her nails.  She says she injured her 3.4 toes left foot this morning.  This patient presents for at risk foot care today.   General Appearance  Alert, conversant and in no acute stress.  Vascular  Dorsalis pedis and posterior tibial  pulses are  weakly palpable  bilaterally.  Capillary return is within normal limits  bilaterally. Temperature is within normal limits  bilaterally.  Neurologic  Senn-Weinstein monofilament wire test within normal limits  bilaterally. Muscle power within normal limits bilaterally.  Nails Thick disfigured discolored nails with subungual debris  from hallux to fifth toes bilaterally. No evidence of bacterial infection or drainage bilaterally.    Orthopedic  No limitations of motion  feet .  No crepitus or effusions noted.  No bony pathology or digital deformities noted.  Skin  normotropic skin with no porokeratosis noted bilaterally.  No signs of infections or ulcers noted.     Onychomycosis  Pain in right toes  Pain in left toes  Tissue injury 3,4 toe left foot.  Consent was obtained for treatment procedures.   Mechanical debridement of nails 1-5  bilaterally performed with a nail nipper.  Filed with dremel without incident.    Return office visit   3 months.                  Told patient to return for periodic foot care and evaluation due to potential at risk complications.     Helane Gunther DPM

## 2023-11-20 DIAGNOSIS — I130 Hypertensive heart and chronic kidney disease with heart failure and stage 1 through stage 4 chronic kidney disease, or unspecified chronic kidney disease: Secondary | ICD-10-CM | POA: Diagnosis not present

## 2023-11-20 DIAGNOSIS — Z466 Encounter for fitting and adjustment of urinary device: Secondary | ICD-10-CM | POA: Diagnosis not present

## 2023-11-20 DIAGNOSIS — I5032 Chronic diastolic (congestive) heart failure: Secondary | ICD-10-CM | POA: Diagnosis not present

## 2023-11-20 DIAGNOSIS — N1832 Chronic kidney disease, stage 3b: Secondary | ICD-10-CM | POA: Diagnosis not present

## 2023-11-20 DIAGNOSIS — D631 Anemia in chronic kidney disease: Secondary | ICD-10-CM | POA: Diagnosis not present

## 2023-11-20 DIAGNOSIS — M1990 Unspecified osteoarthritis, unspecified site: Secondary | ICD-10-CM | POA: Diagnosis not present

## 2023-11-30 DIAGNOSIS — H40023 Open angle with borderline findings, high risk, bilateral: Secondary | ICD-10-CM | POA: Diagnosis not present

## 2023-11-30 DIAGNOSIS — H01004 Unspecified blepharitis left upper eyelid: Secondary | ICD-10-CM | POA: Diagnosis not present

## 2023-11-30 DIAGNOSIS — H01001 Unspecified blepharitis right upper eyelid: Secondary | ICD-10-CM | POA: Diagnosis not present

## 2023-11-30 DIAGNOSIS — B880 Other acariasis: Secondary | ICD-10-CM | POA: Diagnosis not present

## 2023-12-01 DIAGNOSIS — I130 Hypertensive heart and chronic kidney disease with heart failure and stage 1 through stage 4 chronic kidney disease, or unspecified chronic kidney disease: Secondary | ICD-10-CM | POA: Diagnosis not present

## 2023-12-01 DIAGNOSIS — K5900 Constipation, unspecified: Secondary | ICD-10-CM | POA: Diagnosis not present

## 2023-12-01 DIAGNOSIS — Z466 Encounter for fitting and adjustment of urinary device: Secondary | ICD-10-CM | POA: Diagnosis not present

## 2023-12-01 DIAGNOSIS — Z87440 Personal history of urinary (tract) infections: Secondary | ICD-10-CM | POA: Diagnosis not present

## 2023-12-01 DIAGNOSIS — Z8701 Personal history of pneumonia (recurrent): Secondary | ICD-10-CM | POA: Diagnosis not present

## 2023-12-01 DIAGNOSIS — E785 Hyperlipidemia, unspecified: Secondary | ICD-10-CM | POA: Diagnosis not present

## 2023-12-01 DIAGNOSIS — M1990 Unspecified osteoarthritis, unspecified site: Secondary | ICD-10-CM | POA: Diagnosis not present

## 2023-12-01 DIAGNOSIS — H409 Unspecified glaucoma: Secondary | ICD-10-CM | POA: Diagnosis not present

## 2023-12-01 DIAGNOSIS — N1832 Chronic kidney disease, stage 3b: Secondary | ICD-10-CM | POA: Diagnosis not present

## 2023-12-01 DIAGNOSIS — I5032 Chronic diastolic (congestive) heart failure: Secondary | ICD-10-CM | POA: Diagnosis not present

## 2023-12-01 DIAGNOSIS — D631 Anemia in chronic kidney disease: Secondary | ICD-10-CM | POA: Diagnosis not present

## 2023-12-01 DIAGNOSIS — E039 Hypothyroidism, unspecified: Secondary | ICD-10-CM | POA: Diagnosis not present

## 2023-12-02 ENCOUNTER — Encounter (INDEPENDENT_AMBULATORY_CARE_PROVIDER_SITE_OTHER): Payer: Medicare Other | Admitting: Ophthalmology

## 2023-12-02 DIAGNOSIS — H43813 Vitreous degeneration, bilateral: Secondary | ICD-10-CM | POA: Diagnosis not present

## 2023-12-02 DIAGNOSIS — H353231 Exudative age-related macular degeneration, bilateral, with active choroidal neovascularization: Secondary | ICD-10-CM | POA: Diagnosis not present

## 2023-12-02 DIAGNOSIS — H35033 Hypertensive retinopathy, bilateral: Secondary | ICD-10-CM | POA: Diagnosis not present

## 2023-12-02 DIAGNOSIS — I10 Essential (primary) hypertension: Secondary | ICD-10-CM

## 2023-12-04 DIAGNOSIS — I5032 Chronic diastolic (congestive) heart failure: Secondary | ICD-10-CM | POA: Diagnosis not present

## 2023-12-04 DIAGNOSIS — N1832 Chronic kidney disease, stage 3b: Secondary | ICD-10-CM | POA: Diagnosis not present

## 2023-12-04 DIAGNOSIS — I130 Hypertensive heart and chronic kidney disease with heart failure and stage 1 through stage 4 chronic kidney disease, or unspecified chronic kidney disease: Secondary | ICD-10-CM | POA: Diagnosis not present

## 2023-12-04 DIAGNOSIS — I10 Essential (primary) hypertension: Secondary | ICD-10-CM | POA: Diagnosis not present

## 2023-12-19 DIAGNOSIS — I10 Essential (primary) hypertension: Secondary | ICD-10-CM | POA: Diagnosis not present

## 2023-12-19 DIAGNOSIS — N1832 Chronic kidney disease, stage 3b: Secondary | ICD-10-CM | POA: Diagnosis not present

## 2023-12-19 DIAGNOSIS — M1611 Unilateral primary osteoarthritis, right hip: Secondary | ICD-10-CM | POA: Diagnosis not present

## 2023-12-19 DIAGNOSIS — H409 Unspecified glaucoma: Secondary | ICD-10-CM | POA: Diagnosis not present

## 2023-12-22 DIAGNOSIS — N1832 Chronic kidney disease, stage 3b: Secondary | ICD-10-CM | POA: Diagnosis not present

## 2023-12-22 DIAGNOSIS — I130 Hypertensive heart and chronic kidney disease with heart failure and stage 1 through stage 4 chronic kidney disease, or unspecified chronic kidney disease: Secondary | ICD-10-CM | POA: Diagnosis not present

## 2023-12-22 DIAGNOSIS — Z466 Encounter for fitting and adjustment of urinary device: Secondary | ICD-10-CM | POA: Diagnosis not present

## 2023-12-22 DIAGNOSIS — M1990 Unspecified osteoarthritis, unspecified site: Secondary | ICD-10-CM | POA: Diagnosis not present

## 2023-12-22 DIAGNOSIS — D631 Anemia in chronic kidney disease: Secondary | ICD-10-CM | POA: Diagnosis not present

## 2023-12-22 DIAGNOSIS — I5032 Chronic diastolic (congestive) heart failure: Secondary | ICD-10-CM | POA: Diagnosis not present

## 2023-12-28 DIAGNOSIS — M1990 Unspecified osteoarthritis, unspecified site: Secondary | ICD-10-CM | POA: Diagnosis not present

## 2023-12-28 DIAGNOSIS — D631 Anemia in chronic kidney disease: Secondary | ICD-10-CM | POA: Diagnosis not present

## 2023-12-28 DIAGNOSIS — N1832 Chronic kidney disease, stage 3b: Secondary | ICD-10-CM | POA: Diagnosis not present

## 2023-12-28 DIAGNOSIS — I130 Hypertensive heart and chronic kidney disease with heart failure and stage 1 through stage 4 chronic kidney disease, or unspecified chronic kidney disease: Secondary | ICD-10-CM | POA: Diagnosis not present

## 2023-12-28 DIAGNOSIS — Z466 Encounter for fitting and adjustment of urinary device: Secondary | ICD-10-CM | POA: Diagnosis not present

## 2023-12-28 DIAGNOSIS — I5032 Chronic diastolic (congestive) heart failure: Secondary | ICD-10-CM | POA: Diagnosis not present

## 2023-12-31 DIAGNOSIS — I5032 Chronic diastolic (congestive) heart failure: Secondary | ICD-10-CM | POA: Diagnosis not present

## 2023-12-31 DIAGNOSIS — K5900 Constipation, unspecified: Secondary | ICD-10-CM | POA: Diagnosis not present

## 2023-12-31 DIAGNOSIS — Z87440 Personal history of urinary (tract) infections: Secondary | ICD-10-CM | POA: Diagnosis not present

## 2023-12-31 DIAGNOSIS — H409 Unspecified glaucoma: Secondary | ICD-10-CM | POA: Diagnosis not present

## 2023-12-31 DIAGNOSIS — Z8701 Personal history of pneumonia (recurrent): Secondary | ICD-10-CM | POA: Diagnosis not present

## 2023-12-31 DIAGNOSIS — I130 Hypertensive heart and chronic kidney disease with heart failure and stage 1 through stage 4 chronic kidney disease, or unspecified chronic kidney disease: Secondary | ICD-10-CM | POA: Diagnosis not present

## 2023-12-31 DIAGNOSIS — E785 Hyperlipidemia, unspecified: Secondary | ICD-10-CM | POA: Diagnosis not present

## 2023-12-31 DIAGNOSIS — Z466 Encounter for fitting and adjustment of urinary device: Secondary | ICD-10-CM | POA: Diagnosis not present

## 2023-12-31 DIAGNOSIS — E039 Hypothyroidism, unspecified: Secondary | ICD-10-CM | POA: Diagnosis not present

## 2023-12-31 DIAGNOSIS — M1990 Unspecified osteoarthritis, unspecified site: Secondary | ICD-10-CM | POA: Diagnosis not present

## 2023-12-31 DIAGNOSIS — D631 Anemia in chronic kidney disease: Secondary | ICD-10-CM | POA: Diagnosis not present

## 2023-12-31 DIAGNOSIS — N1832 Chronic kidney disease, stage 3b: Secondary | ICD-10-CM | POA: Diagnosis not present

## 2024-01-17 DIAGNOSIS — I130 Hypertensive heart and chronic kidney disease with heart failure and stage 1 through stage 4 chronic kidney disease, or unspecified chronic kidney disease: Secondary | ICD-10-CM | POA: Diagnosis not present

## 2024-01-17 DIAGNOSIS — D631 Anemia in chronic kidney disease: Secondary | ICD-10-CM | POA: Diagnosis not present

## 2024-01-17 DIAGNOSIS — Z466 Encounter for fitting and adjustment of urinary device: Secondary | ICD-10-CM | POA: Diagnosis not present

## 2024-01-17 DIAGNOSIS — M1990 Unspecified osteoarthritis, unspecified site: Secondary | ICD-10-CM | POA: Diagnosis not present

## 2024-01-17 DIAGNOSIS — N1832 Chronic kidney disease, stage 3b: Secondary | ICD-10-CM | POA: Diagnosis not present

## 2024-01-17 DIAGNOSIS — I5032 Chronic diastolic (congestive) heart failure: Secondary | ICD-10-CM | POA: Diagnosis not present

## 2024-01-27 ENCOUNTER — Emergency Department (HOSPITAL_COMMUNITY)

## 2024-01-27 ENCOUNTER — Observation Stay (HOSPITAL_COMMUNITY)

## 2024-01-27 ENCOUNTER — Encounter (HOSPITAL_COMMUNITY): Payer: Self-pay

## 2024-01-27 ENCOUNTER — Observation Stay (HOSPITAL_COMMUNITY)
Admission: EM | Admit: 2024-01-27 | Discharge: 2024-01-29 | Disposition: A | Discharge status: Home / Self Care | Attending: Internal Medicine | Admitting: Internal Medicine

## 2024-01-27 DIAGNOSIS — R0602 Shortness of breath: Secondary | ICD-10-CM

## 2024-01-27 DIAGNOSIS — E876 Hypokalemia: Secondary | ICD-10-CM

## 2024-01-27 DIAGNOSIS — G9341 Metabolic encephalopathy: Secondary | ICD-10-CM | POA: Diagnosis not present

## 2024-01-27 DIAGNOSIS — I130 Hypertensive heart and chronic kidney disease with heart failure and stage 1 through stage 4 chronic kidney disease, or unspecified chronic kidney disease: Secondary | ICD-10-CM | POA: Diagnosis not present

## 2024-01-27 DIAGNOSIS — N1832 Chronic kidney disease, stage 3b: Secondary | ICD-10-CM | POA: Diagnosis not present

## 2024-01-27 DIAGNOSIS — R9082 White matter disease, unspecified: Secondary | ICD-10-CM | POA: Diagnosis not present

## 2024-01-27 DIAGNOSIS — Z79899 Other long term (current) drug therapy: Secondary | ICD-10-CM | POA: Diagnosis not present

## 2024-01-27 DIAGNOSIS — I4891 Unspecified atrial fibrillation: Secondary | ICD-10-CM | POA: Diagnosis not present

## 2024-01-27 DIAGNOSIS — M1711 Unilateral primary osteoarthritis, right knee: Secondary | ICD-10-CM

## 2024-01-27 DIAGNOSIS — I10 Essential (primary) hypertension: Secondary | ICD-10-CM

## 2024-01-27 DIAGNOSIS — D689 Coagulation defect, unspecified: Secondary | ICD-10-CM

## 2024-01-27 DIAGNOSIS — R4182 Altered mental status, unspecified: Secondary | ICD-10-CM | POA: Diagnosis not present

## 2024-01-27 DIAGNOSIS — W450XXA Nail entering through skin, initial encounter: Secondary | ICD-10-CM

## 2024-01-27 DIAGNOSIS — J9601 Acute respiratory failure with hypoxia: Secondary | ICD-10-CM

## 2024-01-27 DIAGNOSIS — M19011 Primary osteoarthritis, right shoulder: Secondary | ICD-10-CM

## 2024-01-27 DIAGNOSIS — R0600 Dyspnea, unspecified: Secondary | ICD-10-CM | POA: Insufficient documentation

## 2024-01-27 DIAGNOSIS — I5033 Acute on chronic diastolic (congestive) heart failure: Secondary | ICD-10-CM | POA: Insufficient documentation

## 2024-01-27 DIAGNOSIS — I499 Cardiac arrhythmia, unspecified: Secondary | ICD-10-CM | POA: Diagnosis not present

## 2024-01-27 DIAGNOSIS — F0390 Unspecified dementia without behavioral disturbance: Secondary | ICD-10-CM | POA: Diagnosis not present

## 2024-01-27 DIAGNOSIS — T1490XA Injury, unspecified, initial encounter: Secondary | ICD-10-CM

## 2024-01-27 DIAGNOSIS — I4892 Unspecified atrial flutter: Secondary | ICD-10-CM | POA: Diagnosis present

## 2024-01-27 DIAGNOSIS — Z96611 Presence of right artificial shoulder joint: Secondary | ICD-10-CM | POA: Diagnosis not present

## 2024-01-27 DIAGNOSIS — J690 Pneumonitis due to inhalation of food and vomit: Secondary | ICD-10-CM

## 2024-01-27 DIAGNOSIS — Z01810 Encounter for preprocedural cardiovascular examination: Secondary | ICD-10-CM

## 2024-01-27 DIAGNOSIS — E039 Hypothyroidism, unspecified: Secondary | ICD-10-CM | POA: Insufficient documentation

## 2024-01-27 DIAGNOSIS — Z96651 Presence of right artificial knee joint: Secondary | ICD-10-CM | POA: Insufficient documentation

## 2024-01-27 LAB — URINALYSIS, ROUTINE W REFLEX MICROSCOPIC
Bilirubin Urine: NEGATIVE
Glucose, UA: NEGATIVE mg/dL
Ketones, ur: NEGATIVE mg/dL
Nitrite: NEGATIVE
Protein, ur: 30 mg/dL — AB
Specific Gravity, Urine: 1.008 (ref 1.005–1.030)
pH: 8 (ref 5.0–8.0)

## 2024-01-27 LAB — COMPREHENSIVE METABOLIC PANEL WITH GFR
ALT: 12 U/L (ref 0–44)
AST: 18 U/L (ref 15–41)
Albumin: 3.6 g/dL (ref 3.5–5.0)
Alkaline Phosphatase: 97 U/L (ref 38–126)
Anion gap: 13 (ref 5–15)
BUN: 36 mg/dL — ABNORMAL HIGH (ref 8–23)
CO2: 24 mmol/L (ref 22–32)
Calcium: 10.1 mg/dL (ref 8.9–10.3)
Chloride: 103 mmol/L (ref 98–111)
Creatinine, Ser: 1.34 mg/dL — ABNORMAL HIGH (ref 0.44–1.00)
GFR, Estimated: 36 mL/min — ABNORMAL LOW (ref >60)
Glucose, Bld: 100 mg/dL — ABNORMAL HIGH (ref 70–99)
Potassium: 3.8 mmol/L (ref 3.5–5.1)
Sodium: 140 mmol/L (ref 135–145)
Total Bilirubin: 1.1 mg/dL (ref 0.0–1.2)
Total Protein: 7.1 g/dL (ref 6.5–8.1)

## 2024-01-27 LAB — CBC
HCT: 36 % (ref 36.0–46.0)
Hemoglobin: 11.3 g/dL — ABNORMAL LOW (ref 12.0–15.0)
MCH: 31.1 pg (ref 26.0–34.0)
MCHC: 31.4 g/dL (ref 30.0–36.0)
MCV: 99.2 fL (ref 80.0–100.0)
Platelets: 141 K/uL — ABNORMAL LOW (ref 150–400)
RBC: 3.63 MIL/uL — ABNORMAL LOW (ref 3.87–5.11)
RDW: 14.7 % (ref 11.5–15.5)
WBC: 5.7 K/uL (ref 4.0–10.5)
nRBC: 0 % (ref 0.0–0.2)

## 2024-01-27 LAB — TROPONIN I (HIGH SENSITIVITY)
Troponin I (High Sensitivity): 9 ng/L (ref <18)
Troponin I (High Sensitivity): 9 ng/L (ref <18)

## 2024-01-27 LAB — TSH: TSH: 4.722 u[IU]/mL — ABNORMAL HIGH (ref 0.350–4.500)

## 2024-01-27 LAB — BRAIN NATRIURETIC PEPTIDE: B Natriuretic Peptide: 606.3 pg/mL — ABNORMAL HIGH (ref 0.0–100.0)

## 2024-01-27 LAB — T4, FREE: Free T4: 1.26 ng/dL — ABNORMAL HIGH (ref 0.61–1.12)

## 2024-01-27 LAB — VITAMIN B12: Vitamin B-12: 2383 pg/mL — ABNORMAL HIGH (ref 180–914)

## 2024-01-27 MED ORDER — FUROSEMIDE 10 MG/ML IJ SOLN
20.0000 mg | Freq: Once | INTRAMUSCULAR | Status: AC
  Administered 2024-01-27: 20 mg via INTRAVENOUS
  Filled 2024-01-27: qty 2

## 2024-01-27 MED ORDER — DILTIAZEM HCL 25 MG/5ML IV SOLN
20.0000 mg | Freq: Once | INTRAVENOUS | Status: AC
  Administered 2024-01-27: 20 mg via INTRAVENOUS
  Filled 2024-01-27: qty 5

## 2024-01-27 MED ORDER — ACETAMINOPHEN 650 MG RE SUPP: 650.0000 mg | Freq: Four times a day (QID) | RECTAL | Status: DC | PRN

## 2024-01-27 MED ORDER — SENNOSIDES-DOCUSATE SODIUM 8.6-50 MG PO TABS
1.0000 | ORAL_TABLET | Freq: Every evening | ORAL | Status: DC | PRN
  Administered 2024-01-28: 1 via ORAL
  Filled 2024-01-27: qty 1

## 2024-01-27 MED ORDER — LACTATED RINGERS IV BOLUS
500.0000 mL | Freq: Once | INTRAVENOUS | Status: AC
  Administered 2024-01-27: 500 mL via INTRAVENOUS

## 2024-01-27 MED ORDER — LEVOTHYROXINE SODIUM 88 MCG PO TABS
88.0000 ug | ORAL_TABLET | Freq: Every day | ORAL | Status: DC
  Administered 2024-01-28 – 2024-01-29 (×2): 88 ug via ORAL
  Filled 2024-01-27 (×2): qty 1

## 2024-01-27 MED ORDER — POLYETHYL GLYCOL-PROPYL GLYCOL 0.4-0.3 % OP GEL: OPHTHALMIC | Status: DC | PRN

## 2024-01-27 MED ORDER — ONDANSETRON HCL 4 MG PO TABS: 4.0000 mg | ORAL_TABLET | Freq: Four times a day (QID) | ORAL | Status: DC | PRN

## 2024-01-27 MED ORDER — METOPROLOL TARTRATE 12.5 MG HALF TABLET
12.5000 mg | ORAL_TABLET | Freq: Three times a day (TID) | ORAL | Status: DC
  Administered 2024-01-27: 12.5 mg via ORAL
  Filled 2024-01-27: qty 1

## 2024-01-27 MED ORDER — ONDANSETRON HCL 4 MG/2ML IJ SOLN: 4.0000 mg | Freq: Four times a day (QID) | INTRAMUSCULAR | Status: DC | PRN

## 2024-01-27 MED ORDER — ACETAMINOPHEN 325 MG PO TABS: 650.0000 mg | ORAL_TABLET | Freq: Four times a day (QID) | ORAL | Status: DC | PRN

## 2024-01-27 MED ORDER — LATANOPROST 0.005 % OP SOLN
1.0000 [drp] | Freq: Every day | OPHTHALMIC | Status: DC
  Administered 2024-01-27 – 2024-01-28 (×2): 1 [drp] via OPHTHALMIC
  Filled 2024-01-27: qty 2.5

## 2024-01-27 NOTE — ED Provider Notes (Signed)
 [place] [facility] AT [facility] Provider Note   CSN: [id number] Arrival date & time: 01/27/24  1234     Patient presents with: Atrial Fibrillation   Carla Taylor is a 89 y.o. female.   89 year old female with a past medical history of dementia, hypertension, hypothyroidism, and stage III CKD who presents to the emergency department with new onset A-fib RVR.  The patient denies any prior history of A-fib in the past.  She has had increased fatigue and shortness of breath over the last few days.  She was found to be in A-fib at her primary care physician's office.  She is not on blood thinners.   Atrial Fibrillation       Prior to Admission medications   Medication Sig Start Date End Date Taking? Authorizing Provider  acetaminophen  (TYLENOL ) 650 MG CR tablet Take 1,300 mg by mouth every 6 (six) hours as needed for pain.    [provider]  Calcium  Carb-Cholecalciferol (CALCIUM  600 + D PO) Take 600 mg by mouth in the morning and at bedtime.    [provider]  dorzolamide -timolol  (COSOPT ) 22.3-6.8 MG/ML ophthalmic solution Place 1 drop into the left eye 2 (two) times daily. 01/15/22   [provider]  doxazosin  (CARDURA ) 2 MG tablet TAKE 1 TABLET BY MOUTH  DAILY  PLEASE OBTAIN FUTURE  REFILLS FROM PCP. Patient taking differently: Take 2 mg by mouth daily. 02/19/16   Pietro Redell RAMAN, MD  estradiol (ESTRACE) 0.1 MG/GM vaginal cream Place 1 Applicatorful vaginally 2 (two) times a week. Tuesday and Friday    [provider]  furosemide  (LASIX ) 40 MG tablet Take 1 tablet (40 mg total) by mouth daily. 01/23/22 04/23/22  [place], Eric J, DO  IBUPROFEN PO Take 2 tablets by mouth daily as needed (headache).    [provider]  latanoprost  (XALATAN ) 0.005 % ophthalmic solution Place 1 drop into the left eye at bedtime.    [provider]  levothyroxine  (SYNTHROID , LEVOTHROID) 88 MCG tablet Take 88 mcg by mouth daily before  breakfast.    [provider]  lidocaine  (LIDODERM ) 5 % Place 1 patch onto the skin daily. Remove & Discard patch within 12 hours or as directed by MD 12/07/22   Ethyl Richerd BROCKS, MD  lisinopril  (PRINIVIL ,ZESTRIL ) 40 MG tablet TAKE 1 TABLET BY MOUTH  DAILY Patient taking differently: Take 40 mg by mouth daily. 02/19/16   Pietro Redell RAMAN, MD  metoprolol  succinate (TOPROL -XL) 50 MG 24 hr tablet Take 1 and 1/2 tablets by mouth daily. <PLEASE MAKE APPOINTMENT FOR REFILLS> Patient taking differently: Take 75 mg by mouth daily. 04/02/17   Pietro Redell RAMAN, MD  Multiple Vitamins-Minerals (ICAPS AREDS FORMULA PO) Take 1 tablet by mouth 2 (two) times daily.    [provider]  oxyCODONE -acetaminophen  (PERCOCET/ROXICET) 5-325 MG tablet Take 1 tablet by mouth every 6 (six) hours as needed for severe pain. 12/08/22   Branham, Victoria C, MD  Polyethyl Glycol-Propyl Glycol (SYSTANE OP) Place 1 drop into both eyes as needed (dryness/itching).    [provider]  potassium chloride  SA (KLOR-CON  M) 20 MEQ tablet Take 1 tablet (20 mEq total) by mouth daily. 01/23/22 04/23/22  [place], Camellia PARAS, DO  vitamin B-12 (CYANOCOBALAMIN ) 500 MCG tablet Take 500 mcg by mouth daily.    [provider]  Vitamin D , Ergocalciferol , (DRISDOL ) 50000 UNITS CAPS capsule Take 50,000 Units by mouth every Sunday.    [provider]    Allergies:  Patient has no known allergies.    Review of Systems  All other systems reviewed and are negative.   Updated Vital Signs BP 104/61   Pulse 68   Temp (!) 97.5 F (36.4 C)   Resp 20   Ht 5' 2 (1.575 m)   Wt 80.3 kg   SpO2 98%   BMI 32.37 kg/m   Physical Exam Vitals reviewed.  HENT:     Mouth/Throat:     Mouth: Mucous membranes are moist.  Eyes:     Conjunctiva/sclera: Conjunctivae normal.  Cardiovascular:     Rate and Rhythm: Tachycardia present. Rhythm irregular.     Pulses: Normal pulses.  Pulmonary:     Effort: Pulmonary effort  is normal.     Breath sounds: Normal breath sounds.  Skin:    General: Skin is dry.     Capillary Refill: Capillary refill takes less than 2 seconds.  Neurological:     Mental Status: She is alert. Mental status is at baseline.     (all labs ordered are listed, but only abnormal results are displayed) Labs Reviewed  CBC - Abnormal; Notable for the following components:      Result Value   RBC 3.63 (*)    Hemoglobin 11.3 (*)    Platelets 141 (*)    All other components within normal limits  COMPREHENSIVE METABOLIC PANEL WITH GFR - Abnormal; Notable for the following components:   Glucose, Bld 100 (*)    BUN 36 (*)    Creatinine, Ser 1.34 (*)    GFR, Estimated 36 (*)    All other components within normal limits  BRAIN NATRIURETIC PEPTIDE  TSH  TROPONIN I (HIGH SENSITIVITY)  TROPONIN I (HIGH SENSITIVITY)    EKG: EKG Interpretation Date/Time:  Wednesday January 27 2024 12:46:13 EDT Ventricular Rate:  111 PR Interval:    QRS Duration:  70 QT Interval:  310 QTC Calculation: 421 R Axis:   39  Text Interpretation: Atrial fibrillation with rapid ventricular response Abnormal ECG When compared with ECG of 27-Jan-2024 12:45, PREVIOUS ECG IS PRESENT Confirmed by Corinthia No ([id number]) on 01/27/2024 2:19:04 PM  Radiology: [facility] Chest 2 View Result Date: 01/27/2024 CLINICAL DATA:  Shortness of breath. Atrial fibrillation. 01/20/2022 EXAM: CHEST - 2 VIEW COMPARISON:  01/20/2022 FINDINGS: The cardiac silhouette is borderline enlarged with a mild increase in size. Increased prominence of the pulmonary vasculature. No significant change in mild chronic interstitial prominence and possible tiny bilateral pleural effusions. Stable right shoulder prosthesis. Left shoulder degenerative changes. Thoracic spine degenerative changes. IMPRESSION: 1. Interval borderline cardiomegaly and pulmonary vascular congestion compatible with mild CHF. 2. No significant change in mild chronic interstitial lung disease  and possible tiny bilateral pleural effusions. Electronically Signed   By: Elspeth Bathe M.D.   On: 01/27/2024 14:00     Procedures   Medications Ordered in the ED  diltiazem  (CARDIZEM ) injection 20 mg (20 mg Intravenous Given 01/27/24 1356)  lactated ringers  bolus 500 mL (0 mLs Intravenous Stopped 01/27/24 1455)                                    Medical Decision Making 89 year old female who was found to be in A-fib with RVR after she presented to her physician's office for evaluation.  She has reportedly been more confused, had shortness of breath, and had increased fatigue over the last few days.  This is a new  diagnosis and family deny any history of A-fib in the past. Patient's vitals were stable upon presentation but she was tachycardic.  She did receive a dose of Cardizem  which improved her rate down into the 60s.  She continues to be in A-fib.  Chest x-ray does appear to show evidence of pulmonary vascular congestion.  Cardiology will be consulted for her new onset atrial fibrillation.  Cardiology consulted and plans to see the patient.  They are requesting medicine admission due to her age.  Amount and/or Complexity of Data Reviewed Labs: ordered.  Risk Prescription drug management.     Final diagnoses:  Atrial fibrillation with RVR [facility])    ED Discharge Orders     None          Jahmai Finelli, DO 01/27/24 1502

## 2024-01-27 NOTE — ED Triage Notes (Signed)
 Pt here sent from MD for afib , no other complaints

## 2024-01-27 NOTE — ED Provider Triage Note (Signed)
 Emergency Medicine Provider Triage Evaluation Note  Carla Taylor , a 89 y.o. female  was evaluated in triage.  Pt complains of shortness of breath. Went to her pcp today with 2 days of symptoms, found to be in afib rvr. She has no history of this and is not anticoagulated. Denies any chest pain. Her legs are swollen but reports this is no different from baseline.   Review of Systems  Positive:  Negative:   Physical Exam  BP (!) 128/91 (BP Location: Left Arm)   Pulse (!) 128   Temp (!) 97.5 F (36.4 C)   Resp 20   Ht 5' 2 (1.575 m)   Wt 80.3 kg   SpO2 94%   BMI 32.37 kg/m  Gen:   Awake, no distress   Resp:  Normal effort MSK:   Moves extremities without difficulty  Other:  Mild bilateral lower leg pitting edema. Speaking in complete sentences on room air  Medical Decision Making  Medically screening exam initiated at 12:50 PM.  Appropriate orders placed.  Carla Taylor was informed that the remainder of the evaluation will be completed by another provider, this initial triage assessment does not replace that evaluation, and the importance of remaining in the ED until their evaluation is complete.  New onset afib with rvr   Nora Lauraine LABOR, PA-C 01/27/24 1252

## 2024-01-27 NOTE — Consult Note (Addendum)
 Cardiology Consultation   Patient ID: DESHANNON SEIDE MRN: [id number]; DOB: 13-Jan-1925  Admit date: 01/27/2024 Date of Consult: 01/27/2024  PCP:  Seabron Lenis, MD   [facility] Providers Cardiologist:  None     Patient Profile: Carla Taylor is a 89 y.o. female with a hx of HTN, HLD, CKD stage 3b, hypothyroidism, dementia, macular degeneration, chronic foley, who is being seen 01/27/2024 for the evaluation of new atrial fibrillation with RVR at the request of D. Lowther.  History of Present Illness: Ms. Alpert has past medical history as stated above. She presented to [facility] ED on 01/27/2024 after being found to be in A. Fib at her PCP. She noted increased fatigue and shortness of breath over the last couple of days. EKG in the ED noted her to be in A. Fib with RVR, HR 117. She denies any known history of A. Fib.  Relevant workup in the ED/since admission: TSH pending, troponin negative x 2, CMP showed creatinine 1.34, BNP 606, CXR showed cardiomegaly, pulmonary vascular congestion, mild ILD.   After speaking with the patient, she tells me that she went to here PCP today who told her she was in A. Fib and sent her to the hospital for further workup. She tells me that over the past couple of months she feels as if she was getting progressively more short of breath and having increased LE edema. She typically takes PO Lasix  40 mg daily at home but reports that recently that has not been keeping her fluid off. Her other home medications include: lisinopril  40 mg daily, Toprol  75 mg daily. She tells me that she believes she has been compliant with her medications.   Past Medical History:  Diagnosis Date   Arthritis    Chronic shoulder pain    Hyperlipidemia    Hypertension    Hypothyroid    Macular degeneration    Pancreatitis    Primary localized osteoarthritis of right knee 02/05/2016   Primary osteoarthritis of right shoulder 09/19/2014   Shortness of breath dyspnea    on exertion    Stress bladder incontinence, female    Past Surgical History:  Procedure Laterality Date   carpal tunnel Bilateral    CATARACT EXTRACTION Bilateral    2005   EYE SURGERY     KNEE ARTHROSCOPY Bilateral    2001   No previous surgeries     TOTAL KNEE ARTHROPLASTY Right 02/05/2016   TOTAL KNEE ARTHROPLASTY Right 02/05/2016   Procedure: RIGHT TOTAL KNEE ARTHROPLASTY;  Surgeon: Fonda Olmsted, MD;  Location: [facility] OR;  Service: Orthopedics;  Laterality: Right;   TOTAL SHOULDER ARTHROPLASTY Right 09/19/2014   Procedure: RIGHT TOTAL SHOULDER ARTHROPLASTY;  Surgeon: Fonda SHAUNNA Olmsted, MD;  Location: [facility] OR;  Service: Orthopedics;  Laterality: Right;    Home Medications:  Prior to Admission medications   Medication Sig Start Date End Date Taking? Authorizing Provider  acetaminophen  (TYLENOL ) 650 MG CR tablet Take 1,300 mg by mouth every 6 (six) hours as needed for pain.    [provider]  Calcium  Carb-Cholecalciferol (CALCIUM  600 + D PO) Take 600 mg by mouth in the morning and at bedtime.    [provider]  dorzolamide -timolol  (COSOPT ) 22.3-6.8 MG/ML ophthalmic solution Place 1 drop into the left eye 2 (two) times daily. 01/15/22   [provider]  doxazosin  (CARDURA ) 2 MG tablet TAKE 1 TABLET BY MOUTH  DAILY  PLEASE OBTAIN FUTURE  REFILLS FROM PCP. Patient taking differently: Take 2  mg by mouth daily. 02/19/16   Pietro Redell RAMAN, MD  estradiol (ESTRACE) 0.1 MG/GM vaginal cream Place 1 Applicatorful vaginally 2 (two) times a week. Tuesday and Friday    [provider]  furosemide  (LASIX ) 40 MG tablet Take 1 tablet (40 mg total) by mouth daily. 01/23/22 04/23/22  [place], Eric J, DO  IBUPROFEN PO Take 2 tablets by mouth daily as needed (headache).    [provider]  latanoprost  (XALATAN ) 0.005 % ophthalmic solution Place 1 drop into the left eye at bedtime.    [provider]  levothyroxine  (SYNTHROID , LEVOTHROID) 88 MCG tablet Take 88 mcg by mouth daily  before breakfast.    [provider]  lidocaine  (LIDODERM ) 5 % Place 1 patch onto the skin daily. Remove & Discard patch within 12 hours or as directed by MD 12/07/22   Ethyl Richerd BROCKS, MD  lisinopril  (PRINIVIL ,ZESTRIL ) 40 MG tablet TAKE 1 TABLET BY MOUTH  DAILY Patient taking differently: Take 40 mg by mouth daily. 02/19/16   Pietro Redell RAMAN, MD  metoprolol  succinate (TOPROL -XL) 50 MG 24 hr tablet Take 1 and 1/2 tablets by mouth daily. <PLEASE MAKE APPOINTMENT FOR REFILLS> Patient taking differently: Take 75 mg by mouth daily. 04/02/17   Pietro Redell RAMAN, MD  Multiple Vitamins-Minerals (ICAPS AREDS FORMULA PO) Take 1 tablet by mouth 2 (two) times daily.    [provider]  oxyCODONE -acetaminophen  (PERCOCET/ROXICET) 5-325 MG tablet Take 1 tablet by mouth every 6 (six) hours as needed for severe pain. 12/08/22   Branham, Victoria C, MD  Polyethyl Glycol-Propyl Glycol (SYSTANE OP) Place 1 drop into both eyes as needed (dryness/itching).    [provider]  potassium chloride  SA (KLOR-CON  M) 20 MEQ tablet Take 1 tablet (20 mEq total) by mouth daily. 01/23/22 04/23/22  [place], Camellia PARAS, DO  vitamin B-12 (CYANOCOBALAMIN ) 500 MCG tablet Take 500 mcg by mouth daily.    [provider]  Vitamin D , Ergocalciferol , (DRISDOL ) 50000 UNITS CAPS capsule Take 50,000 Units by mouth every Sunday.    [provider]    Scheduled Meds:  furosemide   20 mg Intravenous Once   Continuous Infusions:  PRN Meds:  Allergies:   No Known Allergies  Social History:   Social History   Socioeconomic History   Marital status: Widowed    Spouse name: Not on file   Number of children: 4   Years of education: Not on file   Highest education level: Not on file  Occupational History    Employer: [employer]  Tobacco Use   Smoking status: Never   Smokeless tobacco: Never  Substance and Sexual Activity   Alcohol  use: No    Alcohol /week: 0.0 standard drinks of alcohol     Drug use: No   Sexual activity: Never  Other Topics Concern   Not on file  Social History Narrative   Not on file   Social Drivers of Health   Financial Resource Strain: Not on file  Food Insecurity: Not on file  Transportation Needs: Not on file  Physical Activity: Not on file  Stress: Not on file  Social Connections: Not on file  Intimate Partner Violence: Not on file    Family History:   Family History  Problem Relation Age of Onset   CVA Mother    CVA Father      ROS:  Please see the history of present illness.  All other ROS reviewed and negative.     Physical Exam/Data:  Vitals:   01/27/24 1237 01/27/24 1242 01/27/24 1400  BP: (!) 128/91  104/61  Pulse: (!) 128  68  Resp: 20  20  Temp: (!) 97.5 F (36.4 C)    SpO2: 94%  98%  Weight:  80.3 kg   Height:  5' 2 (1.575 m)     Intake/Output Summary (Last 24 hours) at 01/27/2024 1705 Last data filed at 01/27/2024 1412 Gross per 24 hour  Intake --  Output 450 ml  Net -450 ml      01/27/2024   12:42 PM 12/07/2022    3:34 PM 02/13/2022   11:34 AM  Last 3 Weights  Weight (lbs) 177 lb 171 lb 15.3 oz 172 lb  Weight (kg) 80.287 kg 78 kg 78.019 kg     Body mass index is 32.37 kg/m.  General:  Well nourished, well developed, in no acute distress HEENT: normal Neck: no JVD Vascular: No carotid bruits; Distal pulses 2+ bilaterally Cardiac:  irregularly irregular rhythm; no murmur  Lungs:  clear to auscultation bilaterally, no wheezing, rhonchi or rales  Abd: soft, nontender, no hepatomegaly  Ext: 1+ pitting LE edema Musculoskeletal:  No deformities, BUE and BLE strength normal and equal Skin: warm and dry  Neuro:  no focal abnormalities noted Psych:  Normal affect   Telemetry:  Telemetry was personally reviewed and demonstrates:  atrial fibrillation with HR 70-80s  Relevant CV Studies: Echocardiogram, 01/27/2024 Ordered, pending results   Laboratory Data: High Sensitivity Troponin:   Recent Labs  Lab  01/27/24 1400 01/27/24 1508  TROPONINIHS 9 9     Chemistry Recent Labs  Lab 01/27/24 1400  NA 140  K 3.8  CL 103  CO2 24  GLUCOSE 100*  BUN 36*  CREATININE 1.34*  CALCIUM  10.1  GFRNONAA 36*  ANIONGAP 13    Recent Labs  Lab 01/27/24 1400  PROT 7.1  ALBUMIN 3.6  AST 18  ALT 12  ALKPHOS 97  BILITOT 1.1   Lipids No results for input(s): CHOL, TRIG, HDL, LABVLDL, LDLCALC, CHOLHDL in the last 168 hours.  Hematology Recent Labs  Lab 01/27/24 1400  WBC 5.7  RBC 3.63*  HGB 11.3*  HCT 36.0  MCV 99.2  MCH 31.1  MCHC 31.4  RDW 14.7  PLT 141*   Thyroid   Recent Labs  Lab 01/27/24 1322  TSH 4.722*    BNP Recent Labs  Lab 01/27/24 1400  BNP 606.3*    DDimer No results for input(s): DDIMER in the last 168 hours.  Radiology/Studies:  DG Chest 2 View Result Date: 01/27/2024 CLINICAL DATA:  Shortness of breath. Atrial fibrillation. 01/20/2022 EXAM: CHEST - 2 VIEW COMPARISON:  01/20/2022 FINDINGS: The cardiac silhouette is borderline enlarged with a mild increase in size. Increased prominence of the pulmonary vasculature. No significant change in mild chronic interstitial prominence and possible tiny bilateral pleural effusions. Stable right shoulder prosthesis. Left shoulder degenerative changes. Thoracic spine degenerative changes. IMPRESSION: 1. Interval borderline cardiomegaly and pulmonary vascular congestion compatible with mild CHF. 2. No significant change in mild chronic interstitial lung disease and possible tiny bilateral pleural effusions. Electronically Signed   By: Elspeth Bathe M.D.   On: 01/27/2024 14:00   Assessment and Plan:  Atrial fibrillation with RVR, new onset  Hypertension  Presented to ED from PCP with new A. Fib with HR 130s Patient denied any prior history of A. Fib  Given IV diltiazem  20 mg x 1 in ED with HR to 60s Currently in A. Fib with HR  70-80s Home meds: PO Lasix  40 mg daily, lisinopril  40 mg daily, Toprol  75 mg  daily Continue to monitor on telemetry  Will discuss anticoagulation with MD, patient has dementia, uses a walker to get around and reports that she does not frequently fall but worries that if she were to fall she would not be able to get up  Shortness of breath  LE edema Patient has history of LE edema but states it has been getting worse over the last couple of months Reports increased DOE in the last few months  BNP 606 Home meds: PO Lasix  40 mg daily, lisinopril  40 mg daily, Toprol  75 mg daily IV Lasix  20 mg x 1 will monitor response and re-dose as needed  Updating echocardiogram, pending results  Continue with strict I&O's, daily BMPs, daily weights  Per primary Hypothyroidism Acute metabolic encephalopathy   Risk Assessment/Risk Scores:      New [place]  Heart Association (NYHA) Functional Class NYHA Class II  CHA2DS2-VASc Score = 4   This indicates a 4.8% annual risk of stroke. The patient's score is based upon: CHF History: 0 HTN History: 1 Diabetes History: 0 Stroke History: 0 Vascular Disease History: 0 Age Score: 2 Gender Score: 1    For questions or updates, please contact [facility] Please consult [email or web address] for contact info under    Signed, Waddell DELENA Donath, PA-C  01/27/2024 5:05 PM  History and all data above reviewed.  I personally took the history today, performed the physical exam and formulated the assessment and plan.  She saw Dr. Pietro years ago.  She had HTN and mild leg edema.  She did not need work up.  She had an echo a couple of years ago with NL LV and mild aortic sclerosis.   The patient has no past history of fib.  She went to see her primary provider because her family said she was irritable.  She lives with one of her children.  She gets around with a walker.  She does pretty well and does not notice any palpitations.  She does not really have any falls because she is pretty careful.  She had one several months ago but again does  not walk without her walker and only in her house.  She does not have presyncope or syncope.  She does have dyspnea with exertion occasionally.  Sounds like she has been sleeping up in a recliner for years because she has had trouble lying flat.  She has had some lower extremity swelling.  Notes she has an indwelling Foley for years because of incontinence.  I reviewed all relevant tests and studies. Patient examined.  I agree with the findings as above.  The patient exam reveals COR: Irregular, 3/6 holosystolic murmur radiating at the aortic outflow tract, no diastolic,  Lungs: Clear to auscultation bilaterally.,  Abd: Positive bowel sounds normal frequency pitch, bruits, rebound, guarding, Ext moderate lower extremity swelling..  All available labs, radiology testing, previous records reviewed. Agree with documented assessment and plan.   New onset atrial fib:  Rate control and anticoagulation.  I would suggest starting Eliquis .  We can revisit this if she has frequent falls in the future but the risk of stroke is high and she is not a Watchman candidate. I will start a low dose of metoprolol  tartrate for rate control as she was on beta blocker prior to admission.  HTN:  BP is labile.  Hold off on Lisinopril  for now.  Will use beta  blocker as primary rate control.  Dyspnea:   Suspect HF secondary to fib.  Likely diastolic but echo should be ordered.  Agree with Lasix  given and reassess in the AM.   Murmur:  I suspect MR.  Echo as above.   Lynwood Schilling  5:33 PM  01/27/2024

## 2024-01-27 NOTE — H&P (Signed)
 History and Physical    Patient: Carla Taylor FMW:[id number] DOB: [birth year] DOA: 01/27/2024 DOS: the patient was seen and examined on 01/27/2024 PCP: Seabron Lenis, MD  Patient coming from: Home  Chief Complaint:  Chief Complaint  Patient presents with   Atrial Fibrillation   HPI: Carla Taylor is a 89 y.o. female with medical history significant of hypertension, hypothyroidism, macular degeneration, Chronic foley catheter. arthritis, presents referred from PCP for evaluation of new onset A-fib.  Patient went to see her PCP for increased fatigue and shortness of breath.  Patient has been having worsening shortness of breath on exertion if she walk around her house she gets short of breath.  She also reports cough when she lies down.  Patient has noted that she has been having worsening fatigue, has been more sleepy over the last couple of days.  She has been also noted to be more confused and having more cognitive issues.  She does knitting, has been having issues following instructions and reading instruction for knitting.   She was noted to be in A-fib RVR at her PCP office.  Evaluation in the ED heart rate was 130s A-fib.  Labs:  sodium 140, potassium 3.8, BUN 36, creatinine 1.3, BNP 606, troponin 9, white blood cell 5.7, hemoglobin 11, platelets 141. -Chest x-ray:Interval borderline cardiomegaly and pulmonary vascular congestion compatible with mild CHF. No significant change in mild chronic interstitial lung disease and possible tiny bilateral pleural effusions.  Review of Systems: As mentioned in the history of present illness. All other systems reviewed and are negative. Past Medical History:  Diagnosis Date   Arthritis    Chronic shoulder pain    Hyperlipidemia    Hypertension    Hypothyroid    Macular degeneration    Pancreatitis    Primary localized osteoarthritis of right knee 02/05/2016   Primary osteoarthritis of right shoulder 09/19/2014   Shortness of breath dyspnea    on  exertion   Stress bladder incontinence, female    Past Surgical History:  Procedure Laterality Date   carpal tunnel Bilateral    CATARACT EXTRACTION Bilateral    2005   EYE SURGERY     KNEE ARTHROSCOPY Bilateral    2001   No previous surgeries     TOTAL KNEE ARTHROPLASTY Right 02/05/2016   TOTAL KNEE ARTHROPLASTY Right 02/05/2016   Procedure: RIGHT TOTAL KNEE ARTHROPLASTY;  Surgeon: Fonda Olmsted, MD;  Location: [facility] OR;  Service: Orthopedics;  Laterality: Right;   TOTAL SHOULDER ARTHROPLASTY Right 09/19/2014   Procedure: RIGHT TOTAL SHOULDER ARTHROPLASTY;  Surgeon: Fonda SHAUNNA Olmsted, MD;  Location: [facility] OR;  Service: Orthopedics;  Laterality: Right;   Social History:  reports that she has never smoked. She has never used smokeless tobacco. She reports that she does not drink alcohol  and does not use drugs.  No Known Allergies  Family History  Problem Relation Age of Onset   CVA Mother    CVA Father     Prior to Admission medications   Medication Sig Start Date End Date Taking? Authorizing Provider  acetaminophen  (TYLENOL ) 650 MG CR tablet Take 1,300 mg by mouth every 6 (six) hours as needed for pain.    [provider]  Calcium  Carb-Cholecalciferol (CALCIUM  600 + D PO) Take 600 mg by mouth in the morning and at bedtime.    [provider]  dorzolamide -timolol  (COSOPT ) 22.3-6.8 MG/ML ophthalmic solution Place 1 drop into the left eye 2 (two) times daily. 01/15/22   [provider]  doxazosin  (CARDURA ) 2 MG tablet TAKE 1 TABLET BY MOUTH  DAILY  PLEASE OBTAIN FUTURE  REFILLS FROM PCP. Patient taking differently: Take 2 mg by mouth daily. 02/19/16   Pietro Redell RAMAN, MD  estradiol (ESTRACE) 0.1 MG/GM vaginal cream Place 1 Applicatorful vaginally 2 (two) times a week. Tuesday and Friday    [provider]  furosemide  (LASIX ) 40 MG tablet Take 1 tablet (40 mg total) by mouth daily. 01/23/22 04/23/22  [place], Eric J, DO  IBUPROFEN PO Take 2 tablets by mouth daily  as needed (headache).    [provider]  latanoprost  (XALATAN ) 0.005 % ophthalmic solution Place 1 drop into the left eye at bedtime.    [provider]  levothyroxine  (SYNTHROID , LEVOTHROID) 88 MCG tablet Take 88 mcg by mouth daily before breakfast.    [provider]  lidocaine  (LIDODERM ) 5 % Place 1 patch onto the skin daily. Remove & Discard patch within 12 hours or as directed by MD 12/07/22   Ethyl Richerd BROCKS, MD  lisinopril  (PRINIVIL ,ZESTRIL ) 40 MG tablet TAKE 1 TABLET BY MOUTH  DAILY Patient taking differently: Take 40 mg by mouth daily. 02/19/16   Pietro Redell RAMAN, MD  metoprolol  succinate (TOPROL -XL) 50 MG 24 hr tablet Take 1 and 1/2 tablets by mouth daily. <PLEASE MAKE APPOINTMENT FOR REFILLS> Patient taking differently: Take 75 mg by mouth daily. 04/02/17   Pietro Redell RAMAN, MD  Multiple Vitamins-Minerals (ICAPS AREDS FORMULA PO) Take 1 tablet by mouth 2 (two) times daily.    [provider]  oxyCODONE -acetaminophen  (PERCOCET/ROXICET) 5-325 MG tablet Take 1 tablet by mouth every 6 (six) hours as needed for severe pain. 12/08/22   Branham, Victoria C, MD  Polyethyl Glycol-Propyl Glycol (SYSTANE OP) Place 1 drop into both eyes as needed (dryness/itching).    [provider]  potassium chloride  SA (KLOR-CON  M) 20 MEQ tablet Take 1 tablet (20 mEq total) by mouth daily. 01/23/22 04/23/22  [place], Camellia PARAS, DO  vitamin B-12 (CYANOCOBALAMIN ) 500 MCG tablet Take 500 mcg by mouth daily.    [provider]  Vitamin D , Ergocalciferol , (DRISDOL ) 50000 UNITS CAPS capsule Take 50,000 Units by mouth every Sunday.    [provider]    Physical Exam: Vitals:   01/27/24 1237 01/27/24 1242 01/27/24 1400  BP: (!) 128/91  104/61  Pulse: (!) 128  68  Resp: 20  20  Temp: (!) 97.5 F (36.4 C)    SpO2: 94%  98%  Weight:  80.3 kg   Height:  5' 2 (1.575 m)    General; No acute distress. Very pleasant.  CVS; S 1, S 2 IRR Lungs: BL crackles.   Abdomen: BS present, soft, nt Extremities; trace edema Neuro: alert, answer questions appropriately. Moves all 4 extremities.   Data Reviewed:  Labs review.   Assessment and Plan: No notes have been filed under this hospital service. Service: Hospitalist  -new onset A-fib, RVR:  -Presents with shortness of breath, tachycardia, heart rate 130s EKG show A-fib RVR -Received IV Cardizem , HR down to 60 -Cardiology consulted, will defer to cardio further rate controlled meds and or anticoagulation.   Dyspnea:  Could be related to acute on chronic Diastolic HF exacerbation. She has pulmonary edema, elevated BNP .  Will give one time dose IV lasix .  Further diuretics per cardiology   Hypothyroidism: -Continue with Synthroid .  TSH Mildly elevated, check Free T 3 and T 4.   Hypertension: hold lisinopril  in case we need rate  controlled meds and to avoid hypotension.  Acute metabolic Encephalopathy Family has notice worsening cognition over last 2 weeks. She has also been more sleepy.  -Plan to check CT head and B 12. Check UA    Advance Care Planning:   Code Status: Prior DNR, would want intervention.   Consults: Cardiology   Family Communication: Son who was at bedside.   Severity of Illness: The appropriate patient status for this patient is OBSERVATION. Observation status is judged to be reasonable and necessary in order to provide the required intensity of service to ensure the patient's safety. The patient's presenting symptoms, physical exam findings, and initial radiographic and laboratory data in the context of their medical condition is felt to place them at decreased risk for further clinical deterioration. Furthermore, it is anticipated that the patient will be medically stable for discharge from the hospital within 2 midnights of admission.   Author: Owen DELENA Lore, MD 01/27/2024 3:38 PM  For on call review [email or web address].

## 2024-01-28 ENCOUNTER — Observation Stay (HOSPITAL_COMMUNITY)

## 2024-01-28 ENCOUNTER — Telehealth (HOSPITAL_COMMUNITY): Payer: Self-pay | Admitting: Pharmacy Technician

## 2024-01-28 ENCOUNTER — Other Ambulatory Visit (HOSPITAL_COMMUNITY): Payer: Self-pay

## 2024-01-28 DIAGNOSIS — I4892 Unspecified atrial flutter: Secondary | ICD-10-CM | POA: Diagnosis not present

## 2024-01-28 DIAGNOSIS — I10 Essential (primary) hypertension: Secondary | ICD-10-CM | POA: Diagnosis not present

## 2024-01-28 DIAGNOSIS — I4891 Unspecified atrial fibrillation: Secondary | ICD-10-CM | POA: Diagnosis not present

## 2024-01-28 DIAGNOSIS — R0602 Shortness of breath: Secondary | ICD-10-CM | POA: Diagnosis not present

## 2024-01-28 DIAGNOSIS — R0609 Other forms of dyspnea: Secondary | ICD-10-CM

## 2024-01-28 LAB — BASIC METABOLIC PANEL WITH GFR
Anion gap: 14 (ref 5–15)
BUN: 35 mg/dL — ABNORMAL HIGH (ref 8–23)
CO2: 23 mmol/L (ref 22–32)
Calcium: 9.6 mg/dL (ref 8.9–10.3)
Chloride: 104 mmol/L (ref 98–111)
Creatinine, Ser: 1.32 mg/dL — ABNORMAL HIGH (ref 0.44–1.00)
GFR, Estimated: 37 mL/min — ABNORMAL LOW (ref >60)
Glucose, Bld: 90 mg/dL (ref 70–99)
Potassium: 3.7 mmol/L (ref 3.5–5.1)
Sodium: 141 mmol/L (ref 135–145)

## 2024-01-28 LAB — CBC
HCT: 34.7 % — ABNORMAL LOW (ref 36.0–46.0)
Hemoglobin: 10.9 g/dL — ABNORMAL LOW (ref 12.0–15.0)
MCH: 31 pg (ref 26.0–34.0)
MCHC: 31.4 g/dL (ref 30.0–36.0)
MCV: 98.6 fL (ref 80.0–100.0)
Platelets: 129 K/uL — ABNORMAL LOW (ref 150–400)
RBC: 3.52 MIL/uL — ABNORMAL LOW (ref 3.87–5.11)
RDW: 15.1 % (ref 11.5–15.5)
WBC: 5 K/uL (ref 4.0–10.5)
nRBC: 0 % (ref 0.0–0.2)

## 2024-01-28 LAB — ECHOCARDIOGRAM COMPLETE
Height: 62 in
MV VTI: 1.91 cm2
S' Lateral: 2.3 cm
Weight: 2832 [oz_av]

## 2024-01-28 MED ORDER — DORZOLAMIDE HCL-TIMOLOL MAL 2-0.5 % OP SOLN
1.0000 [drp] | Freq: Two times a day (BID) | OPHTHALMIC | Status: DC
  Administered 2024-01-28 – 2024-01-29 (×2): 1 [drp] via OPHTHALMIC

## 2024-01-28 MED ORDER — FUROSEMIDE 40 MG PO TABS
40.0000 mg | ORAL_TABLET | Freq: Every day | ORAL | Status: DC
  Administered 2024-01-28 – 2024-01-29 (×2): 40 mg via ORAL
  Filled 2024-01-28 (×2): qty 1

## 2024-01-28 MED ORDER — APIXABAN 5 MG PO TABS
5.0000 mg | ORAL_TABLET | Freq: Two times a day (BID) | ORAL | Status: DC
  Administered 2024-01-28 – 2024-01-29 (×3): 5 mg via ORAL
  Filled 2024-01-28 (×3): qty 1

## 2024-01-28 MED ORDER — METOPROLOL SUCCINATE ER 50 MG PO TB24: 50.0000 mg | ORAL_TABLET | Freq: Every day | ORAL | Status: DC

## 2024-01-28 MED ORDER — POLYETHYLENE GLYCOL 3350 17 G PO PACK
17.0000 g | PACK | Freq: Every day | ORAL | Status: DC
  Administered 2024-01-28 – 2024-01-29 (×2): 17 g via ORAL
  Filled 2024-01-28 (×2): qty 1

## 2024-01-28 MED ORDER — DORZOLAMIDE HCL-TIMOLOL MAL 2-0.5 % OP SOLN
1.0000 [drp] | Freq: Two times a day (BID) | OPHTHALMIC | Status: DC
  Administered 2024-01-28: 1 [drp] via OPHTHALMIC
  Filled 2024-01-28: qty 10

## 2024-01-28 MED ORDER — METOPROLOL SUCCINATE ER 50 MG PO TB24
75.0000 mg | ORAL_TABLET | Freq: Every day | ORAL | Status: DC
  Administered 2024-01-28: 75 mg via ORAL
  Filled 2024-01-28: qty 1

## 2024-01-28 NOTE — Progress Notes (Signed)
 PROGRESS NOTE    Carla Taylor  FMW:[id number] DOB: 1924/09/01 DOA: 01/27/2024 PCP: Seabron Lenis, MD   Brief Narrative: Carla Taylor is a 89 y.o. female with medical history significant of hypertension, hypothyroidism, macular degeneration, Chronic foley catheter. arthritis, presents referred from PCP for evaluation of new onset A-fib.  Patient went to see her PCP for increased fatigue and shortness of breath. Symptoms worse over last couple of days. Family also notice some confusion and cognition declines.    Assessment & Plan:   Principal Problem:   Atrial fib/flutter, transient (HCC)  -new onset A-fib, RVR:  -Presents with shortness of breath, tachycardia, heart rate 130s EKG show A-fib RVR -Received IV Cardizem , HR down to 60 -Cardiology consulted, plan to transition to metoprolol  Xl.  -started on Eliquis .    Dyspnea:  Could be related to acute on chronic Diastolic HF exacerbation. She has pulmonary edema, elevated BNP .  Received one dose IV lasix ,  Plan to resume oral lasix .  ECHO pending.    Hypothyroidism: -Continue with Synthroid .  TSH Mildly elevated, check Free T 3 pending and T 4. 1.2  would continue with current dose for now.   Hypertension: hold lisinopril  in case we need rate controlled meds and to avoid hypotension.   Acute metabolic Encephalopathy Family has notice worsening cognition over last 2 weeks. She has also been more sleepy.  -CT head:  Mild-to-moderate periventricular white matter disease. No apparent acute process. -B 12: 2383.  -UA 6-10 WBC. Urine culture ordered.   advised patient will need out patient neurology follow up for evaluation of dementia.  Estimated body mass index is 32.37 kg/m as calculated from the following:   Height as of this encounter: 5' 2 (1.575 m).   Weight as of this encounter: 80.3 kg.   DVT prophylaxis: Eliquis  Code Status: DNR-Intervention  Family Communication: Son who was at bedside Disposition Plan:  Status  is: Observation The patient remains OBS appropriate and will d/c before 2 midnights.    Consultants:  Cardiology   Procedures:  ECHO  Antimicrobials:    Subjective: She is alert, doesn't have her hearing aids on. Feels ok, denies dyspnea.   Objective: Vitals:   01/27/24 1929 01/27/24 2134 01/27/24 2301 01/28/24 0303  BP: 122/86 124/88 137/74 (!) 139/95  Pulse: 78  70 (!) 52  Resp: 20  20 20   Temp: 98.2 F (36.8 C)  97.7 F (36.5 C) 97.7 F (36.5 C)  TempSrc: Oral  Oral Oral  SpO2: (!) 86%  94% 93%  Weight:      Height:        Intake/Output Summary (Last 24 hours) at 01/28/2024 0658 Last data filed at 01/28/2024 0509 Gross per 24 hour  Intake 250 ml  Output 2250 ml  Net -2000 ml   Filed Weights   01/27/24 1242  Weight: 80.3 kg    Examination:  General exam: Appears calm and comfortable  Respiratory system: Crackles bases.  Respiratory effort normal. Cardiovascular system: S1 & S2 heard, IRR.  Gastrointestinal system: Abdomen is nondistended, soft and nontender. No organomegaly or masses felt. Normal bowel sounds heard. Central nervous system: Alert and oriented.  Extremities: Symmetric 5 x 5 power.   Data Reviewed: I have personally reviewed following labs and imaging studies  CBC: Recent Labs  Lab 01/27/24 1400 01/28/24 0337  WBC 5.7 5.0  HGB 11.3* 10.9*  HCT 36.0 34.7*  MCV 99.2 98.6  PLT 141* 129*   Basic Metabolic Panel: Recent Labs  Lab 01/27/24 1400 01/28/24 0337  NA 140 141  K 3.8 3.7  CL 103 104  CO2 24 23  GLUCOSE 100* 90  BUN 36* 35*  CREATININE 1.34* 1.32*  CALCIUM  10.1 9.6   GFR: Estimated Creatinine Clearance: 24.5 mL/min (A) (by C-G formula based on SCr of 1.32 mg/dL (H)). Liver Function Tests: Recent Labs  Lab 01/27/24 1400  AST 18  ALT 12  ALKPHOS 97  BILITOT 1.1  PROT 7.1  ALBUMIN 3.6   No results for input(s): LIPASE, AMYLASE in the last 168 hours. No results for input(s): AMMONIA in the last 168  hours. Coagulation Profile: No results for input(s): INR, PROTIME in the last 168 hours. Cardiac Enzymes: No results for input(s): CKTOTAL, CKMB, CKMBINDEX, TROPONINI in the last 168 hours. BNP (last 3 results) No results for input(s): PROBNP in the last 8760 hours. HbA1C: No results for input(s): HGBA1C in the last 72 hours. CBG: No results for input(s): GLUCAP in the last 168 hours. Lipid Profile: No results for input(s): CHOL, HDL, LDLCALC, TRIG, CHOLHDL, LDLDIRECT in the last 72 hours. Thyroid  Function Tests: Recent Labs    01/27/24 1322 01/27/24 1938  TSH 4.722*  --   FREET4  --  1.26*   Anemia Panel: Recent Labs    01/27/24 1938  VITAMINB12 2,383*   Sepsis Labs: No results for input(s): PROCALCITON, LATICACIDVEN in the last 168 hours.  No results found for this or any previous visit (from the past 240 hours).       Radiology Studies: CT HEAD WO CONTRAST ( ) Result Date: 01/27/2024 CLINICAL DATA:  Altered mental status, nontraumatic (Ped 0-17y) EXAM: CT HEAD WITHOUT CONTRAST TECHNIQUE: Contiguous axial images were obtained from the base of the skull through the vertex without intravenous contrast. RADIATION DOSE REDUCTION: This exam was performed according to the departmental dose-optimization program which includes automated exposure control, adjustment of the mA and/or kV according to patient size and/or use of iterative reconstruction technique. COMPARISON:  CT of the head dated May 05, 2014. FINDINGS: Brain: Age-related atrophy. Mild to moderate periventricular white matter disease. No evidence of hemorrhage, mass, acute cortical infarct or hydrocephalus. Vascular: Moderate vascular calcifications. Skull: Intact.  No osseous lesions are present. Sinuses/Orbits: The visualized paranasal sinuses are clear. The patient is status post bilateral lens replacement and scleral banding. Other: None. IMPRESSION: 1. Mild-to-moderate  periventricular white matter disease. No apparent acute process. Electronically Signed   By: Evalene Coho M.D.   On: 01/27/2024 17:15   DG Chest 2 View Result Date: 01/27/2024 CLINICAL DATA:  Shortness of breath. Atrial fibrillation. 01/20/2022 EXAM: CHEST - 2 VIEW COMPARISON:  01/20/2022 FINDINGS: The cardiac silhouette is borderline enlarged with a mild increase in size. Increased prominence of the pulmonary vasculature. No significant change in mild chronic interstitial prominence and possible tiny bilateral pleural effusions. Stable right shoulder prosthesis. Left shoulder degenerative changes. Thoracic spine degenerative changes. IMPRESSION: 1. Interval borderline cardiomegaly and pulmonary vascular congestion compatible with mild CHF. 2. No significant change in mild chronic interstitial lung disease and possible tiny bilateral pleural effusions. Electronically Signed   By: Elspeth Bathe M.D.   On: 01/27/2024 14:00        Scheduled Meds:  latanoprost   1 drop Left Eye QHS   levothyroxine   88 mcg Oral QAC breakfast   metoprolol  tartrate  12.5 mg Oral TID   Continuous Infusions:   LOS: 0 days    Time spent: 35 minutes    Takuma Cifelli DELENA Lore, MD [facility]   If 7PM-7AM, please contact night-coverage [email or web address]  01/28/2024, 6:58 AM

## 2024-01-28 NOTE — Progress Notes (Signed)
 Progress Note  Patient Name: Carla Taylor Date of Encounter: 01/28/2024  Primary Cardiologist:   None   Subjective   Denies pain.  Denies SOB.  She does not feel the fib.   Inpatient Medications    Scheduled Meds:  latanoprost   1 drop Left Eye QHS   levothyroxine   88 mcg Oral QAC breakfast   metoprolol  tartrate  12.5 mg Oral TID   Continuous Infusions:  PRN Meds: acetaminophen  **OR** acetaminophen , ondansetron  **OR** ondansetron  (ZOFRAN ) IV, Polyethyl Glycol-Propyl Glycol, senna-docusate   Vital Signs    Vitals:   01/27/24 2134 01/27/24 2301 01/28/24 0303 01/28/24 0724  BP: 124/88 137/74 (!) 139/95 131/73  Pulse:  70 (!) 52 (!) 115  Resp:  20 20 (!) 24  Temp:  97.7 F (36.5 C) 97.7 F (36.5 C) (!) 97.5 F (36.4 C)  TempSrc:  Oral Oral Oral  SpO2:  94% 93% 94%  Weight:      Height:        Intake/Output Summary (Last 24 hours) at 01/28/2024 0740 Last data filed at 01/28/2024 9297 Gross per 24 hour  Intake 250 ml  Output 2500 ml  Net -2250 ml   Filed Weights   01/27/24 1242  Weight: 80.3 kg    Telemetry    Atrial fib with mildly increased rate - Personally Reviewed  ECG    NA - Personally Reviewed  Physical Exam   GEN: No acute distress.   Neck: No  JVD Cardiac: Irregular RR, 3/6 apical systolic murmur, no diastolic murmurs, rubs, or gallops.  Respiratory: Clear  to auscultation bilaterally. GI: Soft, nontender, non-distended  MS:     Mild  edema; No deformity. Neuro:  Nonfocal  Psych: Normal affect   Labs    Chemistry Recent Labs  Lab 01/27/24 1400 01/28/24 0337  NA 140 141  K 3.8 3.7  CL 103 104  CO2 24 23  GLUCOSE 100* 90  BUN 36* 35*  CREATININE 1.34* 1.32*  CALCIUM  10.1 9.6  PROT 7.1  --   ALBUMIN 3.6  --   AST 18  --   ALT 12  --   ALKPHOS 97  --   BILITOT 1.1  --   GFRNONAA 36* 37*  ANIONGAP 13 14     Hematology Recent Labs  Lab 01/27/24 1400 01/28/24 0337  WBC 5.7 5.0  RBC 3.63* 3.52*  HGB 11.3* 10.9*   HCT 36.0 34.7*  MCV 99.2 98.6  MCH 31.1 31.0  MCHC 31.4 31.4  RDW 14.7 15.1  PLT 141* 129*    Cardiac EnzymesNo results for input(s): TROPONINI in the last 168 hours. No results for input(s): TROPIPOC in the last 168 hours.   BNP Recent Labs  Lab 01/27/24 1400  BNP 606.3*     DDimer No results for input(s): DDIMER in the last 168 hours.   Radiology    CT HEAD WO CONTRAST ( ) Result Date: 01/27/2024 CLINICAL DATA:  Altered mental status, nontraumatic (Ped 0-17y) EXAM: CT HEAD WITHOUT CONTRAST TECHNIQUE: Contiguous axial images were obtained from the base of the skull through the vertex without intravenous contrast. RADIATION DOSE REDUCTION: This exam was performed according to the departmental dose-optimization program which includes automated exposure control, adjustment of the mA and/or kV according to patient size and/or use of iterative reconstruction technique. COMPARISON:  CT of the head dated May 05, 2014. FINDINGS: Brain: Age-related atrophy. Mild to moderate periventricular white matter disease. No evidence of hemorrhage, mass, acute cortical infarct or  hydrocephalus. Vascular: Moderate vascular calcifications. Skull: Intact.  No osseous lesions are present. Sinuses/Orbits: The visualized paranasal sinuses are clear. The patient is status post bilateral lens replacement and scleral banding. Other: None. IMPRESSION: 1. Mild-to-moderate periventricular white matter disease. No apparent acute process. Electronically Signed   By: Evalene Coho M.D.   On: 01/27/2024 17:15   DG Chest 2 View Result Date: 01/27/2024 CLINICAL DATA:  Shortness of breath. Atrial fibrillation. 01/20/2022 EXAM: CHEST - 2 VIEW COMPARISON:  01/20/2022 FINDINGS: The cardiac silhouette is borderline enlarged with a mild increase in size. Increased prominence of the pulmonary vasculature. No significant change in mild chronic interstitial prominence and possible tiny bilateral pleural effusions. Stable  right shoulder prosthesis. Left shoulder degenerative changes. Thoracic spine degenerative changes. IMPRESSION: 1. Interval borderline cardiomegaly and pulmonary vascular congestion compatible with mild CHF. 2. No significant change in mild chronic interstitial lung disease and possible tiny bilateral pleural effusions. Electronically Signed   By: Elspeth Bathe M.D.   On: 01/27/2024 14:00    Cardiac Studies   Echo pending   Patient Profile     89 y.o. female with a hx of HTN, HLD, CKD stage 3b, hypothyroidism, dementia, macular degeneration, chronic foley, who is being seen 01/27/2024 for the evaluation of new atrial fibrillation with RVR at the request of D. Lowther.   Assessment & Plan    Atrial fib with RVR:  Will order Eliquis .  Rate is still slightly elevated.  Will change to Toprol  XL at previous dose.  Can likely increase and avoid lisinopril  using beta blocker for BP control.   HTN:  As above.   SOB:  Echo pending.  Good UO overnight.   Given one dose of Lasix  IV.  Resume previous PO lasix  dose.      Murmur:  Likely MR.  Echo pending.  .    For questions or updates, please contact [facility] Please consult [email or web address] for contact info under Cardiology/STEMI.   Signed, Lynwood Schilling, MD  01/28/2024, 7:40 AM

## 2024-01-28 NOTE — Plan of Care (Signed)
 ?  Problem: Clinical Measurements: ?Goal: Will remain free from infection ?Outcome: Progressing ?  ?

## 2024-01-28 NOTE — Care Management Obs Status (Signed)
 MEDICARE OBSERVATION STATUS NOTIFICATION   Patient Details  Name: Carla Taylor MRN: [id number] Date of Birth: [birth year]   Medicare Observation Status Notification Given:  Yes    Jon Cruel 01/28/2024, 3:19 PM

## 2024-01-28 NOTE — Progress Notes (Signed)
 Echocardiogram 2D Echocardiogram has been performed.  Carla Taylor, RDCS 01/28/2024, 8:13 AM

## 2024-01-28 NOTE — Progress Notes (Signed)
 PHARMACY - ANTICOAGULATION CONSULT NOTE  Pharmacy Consult for Eliquis  Indication: atrial fibrillation  No Known Allergies  Patient Measurements: Height: 5' 2 (157.5 cm) Weight: 80.3 kg (177 lb) IBW/kg (Calculated) : 50.1 HEPARIN  DW (KG): 67.9  Vital Signs: Temp: 97.5 F (36.4 C) (07/10 0724) Temp Source: Oral (07/10 0724) BP: 131/73 (07/10 0724) Pulse Rate: 115 (07/10 0724)  Labs: Recent Labs    01/27/24 1400 01/27/24 1508 01/28/24 0337  HGB 11.3*  --  10.9*  HCT 36.0  --  34.7*  PLT 141*  --  129*  CREATININE 1.34*  --  1.32*  TROPONINIHS 9 9  --     Estimated Creatinine Clearance: 24.5 mL/min (A) (by C-G formula based on SCr of 1.32 mg/dL (H)).   Medical History: Past Medical History:  Diagnosis Date   Arthritis    Hyperlipidemia    Hypertension    Hypothyroid    Macular degeneration    Pancreatitis    Primary localized osteoarthritis of right knee 02/05/2016   Primary osteoarthritis of right shoulder 09/19/2014   Stress bladder incontinence, female     Medications:  Medications Prior to Admission  Medication Sig Dispense Refill Last Dose/Taking   acetaminophen  (TYLENOL ) 650 MG CR tablet Take 1,300 mg by mouth every 6 (six) hours as needed for pain.   Unknown   BESIVANCE 0.6 % SUSP Place 4 drops into both eyes See admin instructions. 4 drops before shot & 4 drops after shot   Unknown   Calcium  Carb-Cholecalciferol (CALCIUM  600 + D PO) Take 600 mg by mouth in the morning and at bedtime.   01/27/2024   dorzolamide -timolol  (COSOPT ) 22.3-6.8 MG/ML ophthalmic solution Place 1 drop into the left eye 2 (two) times daily.   01/27/2024   doxazosin  (CARDURA ) 2 MG tablet TAKE 1 TABLET BY MOUTH  DAILY  PLEASE OBTAIN FUTURE  REFILLS FROM PCP. (Patient taking differently: Take 2 mg by mouth daily.) 90 tablet 0 01/27/2024   furosemide  (LASIX ) 40 MG tablet Take 1 tablet (40 mg total) by mouth daily. 30 tablet 2 01/27/2024   IBUPROFEN PO Take 2 tablets by mouth daily as needed  (headache).   Unknown   latanoprost  (XALATAN ) 0.005 % ophthalmic solution Place 1 drop into the left eye at bedtime.   01/26/2024   levothyroxine  (SYNTHROID , LEVOTHROID) 88 MCG tablet Take 88 mcg by mouth daily before breakfast.   01/27/2024   lisinopril  (PRINIVIL ,ZESTRIL ) 40 MG tablet TAKE 1 TABLET BY MOUTH  DAILY (Patient taking differently: Take 40 mg by mouth daily.) 90 tablet 0 01/27/2024   metoprolol  succinate (TOPROL -XL) 50 MG 24 hr tablet Take 1 and 1/2 tablets by mouth daily. <PLEASE MAKE APPOINTMENT FOR REFILLS> (Patient taking differently: Take 75 mg by mouth daily.) 45 tablet 0 01/27/2024   Multiple Vitamins-Minerals (ICAPS AREDS FORMULA PO) Take 1 tablet by mouth 2 (two) times daily.   01/27/2024   Polyethyl Glycol-Propyl Glycol (SYSTANE OP) Place 1 drop into both eyes as needed (dryness/itching).   Unknown   potassium chloride  SA (KLOR-CON  M) 20 MEQ tablet Take 1 tablet (20 mEq total) by mouth daily. 30 tablet 2 01/27/2024   sennosides-docusate sodium  (SENOKOT-S) 8.6-50 MG tablet Take 1 tablet by mouth daily as needed for constipation.   Unknown   vitamin B-12 (CYANOCOBALAMIN ) 500 MCG tablet Take 500 mcg by mouth daily.   01/27/2024   Vitamin D , Ergocalciferol , (DRISDOL ) 50000 UNITS CAPS capsule Take 50,000 Units by mouth every Sunday.   01/24/2024    Assessment: 89 y.o  female with new onset nonvalvular Afib w/RVR.  Not on anticoagulation PTA.   Hgb 10.9, pltc 141> 129K  Age > 80,  wt 80.3 kg , Scr 1.32.  Qualifies for 5mg  bid. Will monitor SCr and if increases to 1.5 or greater , will need to reduce dose.     Goal of Therapy:  Monitor platelets by anticoagulation protocol: Yes   Plan:  Eliquis  5mg  BID Monitor renal function and for s/sx of bleeding Will educate patient and family/caregive as pt noted to have dementia.    Levorn Gaskins, RPh Clinical Pharmacist 01/28/2024,8:53 AM Please check AMION for all [facility] Pharmacy phone numbers After 10:00 PM, call Main Pharmacy [phone]

## 2024-01-28 NOTE — Evaluation (Signed)
 Physical Therapy Evaluation Patient Details Name: Carla Taylor MRN: [id number] DOB: 05-26-1925 Today's Date: 01/28/2024  History of Present Illness  [name] is a 89 y.o. female admitted 01/27/2024 for the evaluation of new atrial fibrillation with RVR PMH: HTN, HLD, CKD stage 3b, hypothyroidism, dementia, macular degeneration, chronic foley  Clinical Impression  Pt admitted with above diagnosis. Pt was able to ambulate with RW with CGA and cues for posture as pt flexes body forward with poor posture in standing overall. Sons state that her gait is close to baseline. Pt did have incr HR to 142 bpm with activity therefoere didn't walk too far.  Sons feel that they have equipment and that they can progress pt at home as they have an exercise program that pt did previously. Will follow pt acutely.  Pt currently with functional limitations due to the deficits listed below (see PT Problem List). Pt will benefit from acute skilled PT to increase their independence and safety with mobility to allow discharge.           If plan is discharge home, recommend the following: A little help with walking and/or transfers;A little help with bathing/dressing/bathroom;Assistance with cooking/housework;Assist for transportation;Help with stairs or ramp for entrance   Can travel by private vehicle        Equipment Recommendations None recommended by PT  Recommendations for Other Services       Functional Status Assessment Patient has had a recent decline in their functional status and demonstrates the ability to make significant improvements in function in a reasonable and predictable amount of time.     Precautions / Restrictions Precautions Precautions: Fall Restrictions Weight Bearing Restrictions Per Provider Order: No      Mobility  Bed Mobility Overal bed mobility: Independent             General bed mobility comments: incr time but pt was able to come to eOB.    Transfers Overall  transfer level: Needs assistance Equipment used: Rolling walker (2 wheels) Transfers: Sit to/from Stand Sit to Stand: Min assist           General transfer comment: Pt needed assist as she had slight posterior lean needing tactile cues for upright stance. Pt maintains flexed posture and sons say this is baseline for pt.    Ambulation/Gait Ambulation/Gait assistance: Contact guard assist Gait Distance (Feet): 25 Feet Assistive device: Rolling walker (2 wheels) Gait Pattern/deviations: Step-through pattern, Decreased stride length, Trunk flexed, Wide base of support, Drifts right/left   Gait velocity interpretation: <1.31 ft/sec, indicative of household ambulator   General Gait Details: Pt was able ambulate with RW short distance with CGA and cues as pt tends to flex and needed cues to steer RW and for upright posture.  Stairs            Wheelchair Mobility     Tilt Bed    Modified Rankin (Stroke Patients Only)       Balance                                             Pertinent Vitals/Pain Pain Assessment Pain Assessment: No/denies pain    Home Living Family/patient expects to be discharged to:: Private residence Living Arrangements: Children (son and wife) Available Help at Discharge: Family;Available PRN/intermittently (family there at night, daughter in law retired, son still works) Type of  Home: House Home Access: Ramped entrance       Home Layout: One level Home Equipment: Cane - single point;Wheelchair - Event organiser (2 wheels);Rollator (4 wheels);BSC/3in1;Shower seat - built in;Grab bars - toilet;Grab bars - tub/shower;Hand held shower head Additional Comments: folding clothes lost balance and fell per sons    Prior Function Prior Level of Function : Independent/Modified Independent;History of Falls (last six months)             Mobility Comments: uses RW in house, rollator out doors and wheelchair outdoors  due to SOB ADLs Comments: B/D self, cooking limited on her own, knits, weeds flowers does her own laundry     Extremity/Trunk Assessment   Upper Extremity Assessment Upper Extremity Assessment: Defer to OT evaluation    Lower Extremity Assessment Lower Extremity Assessment: Generalized weakness    Cervical / Trunk Assessment Cervical / Trunk Assessment: Kyphotic  Communication   Communication Communication: Impaired Factors Affecting Communication: Hearing impaired    Cognition Arousal: Alert Behavior During Therapy: WFL for tasks assessed/performed   PT - Cognitive impairments: No apparent impairments                         Following commands: Intact       Cueing       General Comments General comments (skin integrity, edema, etc.): 108 to 142 bpm with activity, 97% RA    Exercises General Exercises - Lower Extremity Ankle Circles/Pumps: AROM, Both, 10 reps, Seated Long Arc Quad: AROM, Both, 10 reps, Seated   Assessment/Plan    PT Assessment Patient needs continued PT services  PT Problem List Decreased activity tolerance;Decreased balance;Decreased mobility;Decreased knowledge of use of DME;Decreased safety awareness;Decreased knowledge of precautions;Cardiopulmonary status limiting activity       PT Treatment Interventions DME instruction;Gait training;Functional mobility training;Therapeutic activities;Therapeutic exercise;Balance training;Patient/family education    PT Goals (Current goals can be found in the Care Plan section)  Acute Rehab PT Goals Patient Stated Goal: to go home PT Goal Formulation: With patient Time For Goal Achievement: 02/11/24 Potential to Achieve Goals: Good    Frequency Min 2X/week     Co-evaluation               AM-PAC PT 6 Clicks Mobility  Outcome Measure Help needed turning from your back to your side while in a flat bed without using bedrails?: None Help needed moving from lying on your back to  sitting on the side of a flat bed without using bedrails?: A Little Help needed moving to and from a bed to a chair (including a wheelchair)?: A Little Help needed standing up from a chair using your arms (e.g., wheelchair or bedside chair)?: A Little Help needed to walk in hospital room?: A Little Help needed climbing 3-5 steps with a railing? : Total 6 Click Score: 17    End of Session Equipment Utilized During Treatment: Gait belt Activity Tolerance: Patient limited by fatigue (incr HR) Patient left: in chair;with call bell/phone within reach;with chair alarm set Nurse Communication: Mobility status PT Visit Diagnosis: Muscle weakness (generalized) (M62.81)    Time: 0922-0959 PT Time Calculation (min) (ACUTE ONLY): 37 min   Charges:   PT Evaluation $PT Eval Moderate Complexity: 1 Mod PT Treatments $Gait Training: 8-22 mins PT General Charges $$ ACUTE PT VISIT: 1 Visit         Willowdean Luhmann M,PT Acute Rehab Services [phone]   Stephane JULIANNA Bevel 01/28/2024, 1:19 PM

## 2024-01-28 NOTE — Discharge Instructions (Signed)

## 2024-01-28 NOTE — Telephone Encounter (Signed)

## 2024-01-28 NOTE — Plan of Care (Signed)
   Problem: Clinical Measurements: Goal: Will remain free from infection Outcome: Progressing Goal: Diagnostic test results will improve Outcome: Progressing

## 2024-01-29 DIAGNOSIS — I4892 Unspecified atrial flutter: Secondary | ICD-10-CM | POA: Diagnosis not present

## 2024-01-29 DIAGNOSIS — I4891 Unspecified atrial fibrillation: Secondary | ICD-10-CM | POA: Diagnosis not present

## 2024-01-29 DIAGNOSIS — I10 Essential (primary) hypertension: Secondary | ICD-10-CM | POA: Diagnosis not present

## 2024-01-29 DIAGNOSIS — R0602 Shortness of breath: Secondary | ICD-10-CM | POA: Diagnosis not present

## 2024-01-29 LAB — T3, FREE: T3, Free: 2.2 pg/mL (ref 2.0–4.4)

## 2024-01-29 MED ORDER — CEPHALEXIN 500 MG PO CAPS
500.0000 mg | ORAL_CAPSULE | Freq: Two times a day (BID) | ORAL | Status: DC
  Administered 2024-01-29: 500 mg via ORAL
  Filled 2024-01-29: qty 1

## 2024-01-29 MED ORDER — APIXABAN 5 MG PO TABS: 5.0000 mg | ORAL_TABLET | Freq: Two times a day (BID) | ORAL | 3 refills | Status: DC

## 2024-01-29 MED ORDER — FUROSEMIDE 40 MG PO TABS: 40.0000 mg | ORAL_TABLET | Freq: Every day | ORAL | 2 refills | Status: DC

## 2024-01-29 MED ORDER — METOPROLOL SUCCINATE ER 100 MG PO TB24
100.0000 mg | ORAL_TABLET | Freq: Every day | ORAL | Status: DC
  Administered 2024-01-29: 100 mg via ORAL
  Filled 2024-01-29: qty 1

## 2024-01-29 MED ORDER — POTASSIUM CHLORIDE CRYS ER 20 MEQ PO TBCR: 20.0000 meq | EXTENDED_RELEASE_TABLET | Freq: Every day | ORAL | 0 refills | Status: DC

## 2024-01-29 MED ORDER — METOPROLOL SUCCINATE ER 100 MG PO TB24: 100.0000 mg | ORAL_TABLET | Freq: Every day | ORAL | 3 refills | Status: DC

## 2024-01-29 MED ORDER — CEPHALEXIN 500 MG PO CAPS: 500.0000 mg | ORAL_CAPSULE | Freq: Two times a day (BID) | ORAL | 0 refills | Status: AC

## 2024-01-29 NOTE — Progress Notes (Signed)
 PHARMACY NOTE:  ANTIMICROBIAL RENAL DOSAGE ADJUSTMENT  Current antimicrobial regimen includes a mismatch between antimicrobial dosage and estimated renal function.  As per policy approved by the Pharmacy & Therapeutics and Medical Executive Committees, the antimicrobial dosage will be adjusted accordingly.  Current antimicrobial dosage:  Keflex  500mg  TID  Indication: UTI  Renal Function:  Estimated Creatinine Clearance: 24.5 mL/min (A) (by C-G formula based on SCr of 1.32 mg/dL (H)). []      On intermittent HD, scheduled: []      On CRRT    Antimicrobial dosage has been changed to:  Keflex  500mg  BID  Additional comments:   Sergio Batch, PharmD, BCIDP, AAHIVP, CPP Infectious Disease Pharmacist 01/29/2024 10:54 AM

## 2024-01-29 NOTE — Plan of Care (Signed)
   Problem: Education: Goal: Knowledge of General Education information will improve Description: Including pain rating scale, medication(s)/side effects and non-pharmacologic comfort measures Outcome: Progressing   Problem: Clinical Measurements: Goal: Respiratory complications will improve Outcome: Progressing

## 2024-01-29 NOTE — Progress Notes (Addendum)
 Patient being discharged home. Patient to be transported by her son.  IV removed with the catheter intact.  Discharge instructions and prescription information given to the patient and son who verbalized understanding. Patient discharged with chronic foley.

## 2024-01-29 NOTE — Discharge Summary (Signed)
 Physician Discharge Summary   Patient: Carla Taylor MRN: [id number] DOB: 04-25-1925  Admit date:     01/27/2024  Discharge date: 01/29/24  Discharge Physician: Owen DELENA Lore   PCP: Seabron Lenis, MD   Recommendations at discharge:    Follow up with cardiology for further care of A fib and Mitral Valve regurgitation.  Follow up with neurology for evaluation of cognitive decline.  Please follow up urine culture.   Discharge Diagnoses: Principal Problem:   Atrial fib/flutter, transient (HCC)  Resolved Problems:   * No resolved hospital problems. *  Hospital Course: Carla Taylor is a 89 y.o. female with medical history significant of hypertension, hypothyroidism, macular degeneration, Chronic foley catheter. arthritis, presents referred from PCP for evaluation of new onset A-fib.  Patient went to see her PCP for increased fatigue and shortness of breath. Symptoms worse over last couple of days. Family also notice some confusion and cognition declines.     Assessment and Plan: New onset A-fib, RVR:  -Presents with shortness of breath, tachycardia, heart rate 130s EKG show A-fib RVR -Received IV Cardizem , HR down to 60 -Cardiology consulted, plan to transition to metoprolol  Xl. Dose increase to 100 mg.  -started on Eliquis .    Dyspnea:  Acute on chronic Diastolic HF exacerbation. She has pulmonary edema, elevated BNP .  Received one dose IV lasix ,  Plan to resume oral lasix .  ECHO moderate mitral valve regurgitation    Hypothyroidism: -Continue with Synthroid .  TSH Mildly elevated, check Free T 3 pending and T 4. 1.2  would continue with current dose for now.    Hypertension: hold lisinopril  in case we need rate controlled meds and to avoid hypotension.   Acute metabolic Encephalopathy Family has notice worsening cognition over last 2 weeks. She has also been more sleepy.  -CT head:  Mild-to-moderate periventricular white matter disease. No apparent acute process. -B 12:  2383.  -UA 6-10 WBC. Urine culture ordered. 40,000 E coli will tx due to AMS  advised patient will need out patient neurology follow up for evaluation of dementia.   Estimated body mass index is 32.37 kg/m as calculated from the following:   Height as of this encounter: 5' 2 (1.575 m).   Weight as of this encounter: 80.3 kg.            Consultants: Cardiology  Procedures performed: ECHO  Disposition: Home Diet recommendation:  Discharge Diet Orders (From admission, onward)     Start     Ordered   01/29/24 0000  Diet - low sodium heart healthy        01/29/24 1053           Cardiac diet DISCHARGE MEDICATION: Allergies as of 01/29/2024   No Known Allergies      Medication List     STOP taking these medications    IBUPROFEN PO   lisinopril  40 MG tablet Commonly known as: ZESTRIL        TAKE these medications    acetaminophen  650 MG CR tablet Commonly known as: TYLENOL  Take 1,300 mg by mouth every 6 (six) hours as needed for pain.   apixaban  5 MG Tabs tablet Commonly known as: ELIQUIS  Take 1 tablet (5 mg total) by mouth 2 (two) times daily.   Besivance 0.6 % Susp Generic drug: Besifloxacin HCl Place 4 drops into both eyes See admin instructions. 4 drops before shot & 4 drops after shot   CALCIUM  600 + D PO Take 600 mg by mouth  in the morning and at bedtime.   cephALEXin  500 MG capsule Commonly known as: KEFLEX  Take 1 capsule (500 mg total) by mouth 2 (two) times daily for 3 days.   dorzolamide -timolol  2-0.5 % ophthalmic solution Commonly known as: COSOPT  Place 1 drop into both eyes 2 (two) times daily.   doxazosin  2 MG tablet Commonly known as: CARDURA  TAKE 1 TABLET BY MOUTH  DAILY  PLEASE OBTAIN FUTURE  REFILLS FROM PCP. What changed: See the new instructions.   furosemide  40 MG tablet Commonly known as: Lasix  Take 1 tablet (40 mg total) by mouth daily.   ICAPS AREDS FORMULA PO Take 1 tablet by mouth 2 (two) times daily.    latanoprost  0.005 % ophthalmic solution Commonly known as: XALATAN  Place 1 drop into the left eye at bedtime.   levothyroxine  88 MCG tablet Commonly known as: SYNTHROID  Take 88 mcg by mouth daily before breakfast.   metoprolol  succinate 100 MG 24 hr tablet Commonly known as: TOPROL -XL Take 1 tablet (100 mg total) by mouth daily. Take with or immediately following a meal. What changed:  medication strength how much to take how to take this when to take this additional instructions   potassium chloride  SA 20 MEQ tablet Commonly known as: KLOR-CON  M Take 1 tablet (20 mEq total) by mouth daily.   sennosides-docusate sodium  8.6-50 MG tablet Commonly known as: SENOKOT-S Take 1 tablet by mouth daily as needed for constipation.   SYSTANE OP Place 1 drop into both eyes as needed (dryness/itching).   vitamin B-12 500 MCG tablet Commonly known as: CYANOCOBALAMIN  Take 500 mcg by mouth daily.   Vitamin D  (Ergocalciferol ) 1.25 MG (50000 UNIT) Caps capsule Commonly known as: DRISDOL  Take 50,000 Units by mouth every Sunday.        Follow-up Information     Swayne, David, MD Follow up in 1 week(s).   Specialty: Family Medicine Contact information: [address], [address] [place] [address] [phone]                Discharge Exam: Filed Weights   01/27/24 1242 01/29/24 0300  Weight: 80.3 kg 80.6 kg   General; NAD  Condition at discharge: stable  The results of significant diagnostics from this hospitalization (including imaging, microbiology, ancillary and laboratory) are listed below for reference.   Imaging Studies: ECHOCARDIOGRAM COMPLETE Result Date: 01/28/2024    ECHOCARDIOGRAM REPORT   [name] Date of Exam: 01/28/2024 Medical Rec #:  [id number]    Height:       62.0 in Accession #:    [id number]   Weight:       177.0 lb Date of Birth:  10/10/1924    BSA:          1.815 m Patient Age:    89 years     BP:           131/73 mmHg  Patient Gender: F            HR:           10 1 bpm. Exam Location:  Inpatient Procedure: 2D Echo, Color Doppler and Cardiac Doppler (Both Spectral and Color            Flow Doppler were utilized during procedure). Indications:    Dyspnea R06.00  History:        Patient has prior history of Echocardiogram examinations, most  recent 01/22/2022. Arrythmias:Atrial Fibrillation; Risk                 Factors:Dyslipidemia and Hypertension.  Sonographer:    Koleen Popper RDCS Referring Phys: [phone] Tysheka Fanguy A Arelene Moroni IMPRESSIONS  1. Left ventricular ejection fraction, by estimation, is 60 to 65%. The left ventricle has normal function. The left ventricle has no regional wall motion abnormalities. There is moderate left ventricular hypertrophy. Left ventricular diastolic function  could not be evaluated.  2. Right ventricular systolic function is normal. The right ventricular size is normal. There is moderately elevated pulmonary artery systolic pressure.  3. Left atrial size was moderately dilated.  4. Right atrial size was moderately dilated.  5. There is severe mitral annular calcification with restricted movement of the posterior leaflet with moderate MR. The mitral valve is abnormal. Moderate mitral valve regurgitation. No evidence of mitral stenosis. Severe mitral annular calcification.  6. The aortic valve is tricuspid. There is moderate calcification of the aortic valve. Aortic valve regurgitation is trivial. Aortic valve sclerosis/calcification is present, without any evidence of aortic stenosis.  7. The inferior vena cava is dilated in size with >50% respiratory variability, suggesting right atrial pressure of 8 mmHg. FINDINGS  Left Ventricle: Left ventricular ejection fraction, by estimation, is 60 to 65%. The left ventricle has normal function. The left ventricle has no regional wall motion abnormalities. The left ventricular internal cavity size was normal in size. There is  moderate left ventricular  hypertrophy. Left ventricular diastolic function could not be evaluated due to atrial fibrillation. Left ventricular diastolic function could not be evaluated. Right Ventricle: The right ventricular size is normal. No increase in right ventricular wall thickness. Right ventricular systolic function is normal. There is moderately elevated pulmonary artery systolic pressure. The tricuspid regurgitant velocity is 3.27 m/s, and with an assumed right atrial pressure of 8 mmHg, the estimated right ventricular systolic pressure is 50.8 mmHg. Left Atrium: Left atrial size was moderately dilated. Right Atrium: Right atrial size was moderately dilated. Pericardium: There is no evidence of pericardial effusion. Mitral Valve: There is severe mitral annular calcification with restricted movement of the posterior leaflet with moderate MR. The mitral valve is abnormal. There is mild calcification of the mitral valve leaflet(s). Severe mitral annular calcification. Moderate mitral valve regurgitation. No evidence of mitral valve stenosis. MV peak gradient, 12.8 mmHg. The mean mitral valve gradient is 3.0 mmHg. Tricuspid Valve: The tricuspid valve is normal in structure. Tricuspid valve regurgitation is mild . No evidence of tricuspid stenosis. Aortic Valve: The aortic valve is tricuspid. There is moderate calcification of the aortic valve. Aortic valve regurgitation is trivial. Aortic valve sclerosis/calcification is present, without any evidence of aortic stenosis. Pulmonic Valve: The pulmonic valve was normal in structure. Pulmonic valve regurgitation is trivial. No evidence of pulmonic stenosis. Aorta: The aortic root is normal in size and structure. Venous: The inferior vena cava is dilated in size with greater than 50% respiratory variability, suggesting right atrial pressure of 8 mmHg. IAS/Shunts: No atrial level shunt detected by color flow Doppler.  LEFT VENTRICLE PLAX 2D LVIDd:         3.30 cm LVIDs:         2.30 cm LV PW:          1.70 cm LV IVS:        1.10 cm LVOT diam:     2.00 cm LV SV:         51 LV SV Index:   28  LVOT Area:     3.14 cm  RIGHT VENTRICLE            IVC RV Basal diam:  3.90 cm    IVC diam: 2.10 cm RV S prime:     9.46 cm/s TAPSE (M-mode): 1.2 cm LEFT ATRIUM             Index        RIGHT ATRIUM           Index LA diam:        3.30 cm 1.82 cm/m   RA Area:     22.60 cm LA Vol (A2C):   60.5 ml 33.33 ml/m  RA Volume:   74.70 ml  41.16 ml/m LA Vol (A4C):   47.9 ml 26.39 ml/m LA Biplane Vol: 53.8 ml 29.64 ml/m  AORTIC VALVE LVOT Vmax:   79.50 cm/s LVOT Vmean:  53.000 cm/s LVOT VTI:    0.162 m  AORTA Ao Root diam: 3.20 cm Ao Asc diam:  3.20 cm MITRAL VALVE             TRICUSPID VALVE MV Area VTI:  1.91 cm   TR Peak grad:   42.8 mmHg MV Peak grad: 12.8 mmHg  TR Vmax:        327.00 cm/s MV Mean grad: 3.0 mmHg MV Vmax:      1.79 m/s   SHUNTS MV Vmean:     73.4 cm/s  Systemic VTI:  0.16 m                          Systemic Diam: 2.00 cm Toribio Fuel MD Electronically signed by Toribio Fuel MD Signature Date/Time: 01/28/2024/9:46:09 AM    Final    CT HEAD WO CONTRAST ( ) Result Date: 01/27/2024 CLINICAL DATA:  Altered mental status, nontraumatic (Ped 0-17y) EXAM: CT HEAD WITHOUT CONTRAST TECHNIQUE: Contiguous axial images were obtained from the base of the skull through the vertex without intravenous contrast. RADIATION DOSE REDUCTION: This exam was performed according to the departmental dose-optimization program which includes automated exposure control, adjustment of the mA and/or kV according to patient size and/or use of iterative reconstruction technique. COMPARISON:  CT of the head dated May 05, 2014. FINDINGS: Brain: Age-related atrophy. Mild to moderate periventricular white matter disease. No evidence of hemorrhage, mass, acute cortical infarct or hydrocephalus. Vascular: Moderate vascular calcifications. Skull: Intact.  No osseous lesions are present. Sinuses/Orbits: The visualized paranasal  sinuses are clear. The patient is status post bilateral lens replacement and scleral banding. Other: None. IMPRESSION: 1. Mild-to-moderate periventricular white matter disease. No apparent acute process. Electronically Signed   By: Evalene Coho M.D.   On: 01/27/2024 17:15   DG Chest 2 View Result Date: 01/27/2024 CLINICAL DATA:  Shortness of breath. Atrial fibrillation. 01/20/2022 EXAM: CHEST - 2 VIEW COMPARISON:  01/20/2022 FINDINGS: The cardiac silhouette is borderline enlarged with a mild increase in size. Increased prominence of the pulmonary vasculature. No significant change in mild chronic interstitial prominence and possible tiny bilateral pleural effusions. Stable right shoulder prosthesis. Left shoulder degenerative changes. Thoracic spine degenerative changes. IMPRESSION: 1. Interval borderline cardiomegaly and pulmonary vascular congestion compatible with mild CHF. 2. No significant change in mild chronic interstitial lung disease and possible tiny bilateral pleural effusions. Electronically Signed   By: Elspeth Bathe M.D.   On: 01/27/2024 14:00    Microbiology: Results for orders placed or performed during the hospital encounter of 01/27/24  Remove and  replace urinary cath (placed > 5 days) then obtain urine culture from new indwelling urinary catheter.     Status: Abnormal (Preliminary result)   Collection Time: 01/28/24  7:03 AM   Specimen: Urine, Catheterized  Result Value Ref Range Status   Specimen Description URINE, CATHETERIZED  Final   Special Requests NONE  Final   Culture (A)  Final    40,000 COLONIES/mL ESCHERICHIA COLI SUSCEPTIBILITIES TO FOLLOW Performed at [facility], [address] [place], [place] [address]    Report Status PENDING  Incomplete    Labs: CBC: Recent Labs  Lab 01/27/24 1400 01/28/24 0337  WBC 5.7 5.0  HGB 11.3* 10.9*  HCT 36.0 34.7*  MCV 99.2 98.6  PLT 141* 129*   Basic Metabolic Panel: Recent Labs  Lab 01/27/24 1400  01/28/24 0337  NA 140 141  K 3.8 3.7  CL 103 104  CO2 24 23  GLUCOSE 100* 90  BUN 36* 35*  CREATININE 1.34* 1.32*  CALCIUM  10.1 9.6   Liver Function Tests: Recent Labs  Lab 01/27/24 1400  AST 18  ALT 12  ALKPHOS 97  BILITOT 1.1  PROT 7.1  ALBUMIN 3.6   CBG: No results for input(s): GLUCAP in the last 168 hours.  Discharge time spent: greater than 30 minutes.  Signed: Owen DELENA Lore, MD [facility] 01/29/2024

## 2024-01-29 NOTE — Progress Notes (Signed)
 Progress Note  Patient Name: Carla Taylor Date of Encounter: 01/29/2024  Primary Cardiologist:   None   Subjective   No chest pain.  No SOB.    Inpatient Medications    Scheduled Meds:  apixaban   5 mg Oral BID   dorzolamide -timolol   1 drop Both Eyes BID   furosemide   40 mg Oral Daily   latanoprost   1 drop Left Eye QHS   levothyroxine   88 mcg Oral QAC breakfast   metoprolol  succinate  75 mg Oral Daily   polyethylene glycol  17 g Oral Daily   Continuous Infusions:  PRN Meds: acetaminophen  **OR** acetaminophen , ondansetron  **OR** ondansetron  (ZOFRAN ) IV, Polyethyl Glycol-Propyl Glycol, senna-docusate   Vital Signs    Vitals:   01/28/24 1956 01/28/24 2300 01/29/24 0256 01/29/24 0300  BP: (!) 129/98 129/69 120/76 120/76  Pulse: (!) 138 60 77 (!) 57  Resp: 20 20 20 16   Temp: 97.6 F (36.4 C) 97.6 F (36.4 C) 97.7 F (36.5 C)   TempSrc: Oral Oral Oral   SpO2: 94% (!) 85% 95% 93%  Weight:    80.6 kg  Height:        Intake/Output Summary (Last 24 hours) at 01/29/2024 0717 Last data filed at 01/29/2024 0658 Gross per 24 hour  Intake --  Output 2700 ml  Net -2700 ml   Filed Weights   01/27/24 1242 01/29/24 0300  Weight: 80.3 kg 80.6 kg    Telemetry    Atrial fib with rate slightly elevated  - Personally Reviewed  ECG    NA - Personally Reviewed  Physical Exam   GEN: No  acute distress.   Neck: No  JVD Cardiac: Irregular RR, 3/6 apical systolic murmur, no diastolic murmurs, rubs, or gallops.  Respiratory: Clear   to auscultation bilaterally. GI: Soft, nontender, non-distended, normal bowel sounds  MS:  Mild leg edema; No deformity. Neuro:   Nonfocal  Psych: Oriented and appropriate    Labs    Chemistry Recent Labs  Lab 01/27/24 1400 01/28/24 0337  NA 140 141  K 3.8 3.7  CL 103 104  CO2 24 23  GLUCOSE 100* 90  BUN 36* 35*  CREATININE 1.34* 1.32*  CALCIUM  10.1 9.6  PROT 7.1  --   ALBUMIN 3.6  --   AST 18  --   ALT 12  --   ALKPHOS  97  --   BILITOT 1.1  --   GFRNONAA 36* 37*  ANIONGAP 13 14     Hematology Recent Labs  Lab 01/27/24 1400 01/28/24 0337  WBC 5.7 5.0  RBC 3.63* 3.52*  HGB 11.3* 10.9*  HCT 36.0 34.7*  MCV 99.2 98.6  MCH 31.1 31.0  MCHC 31.4 31.4  RDW 14.7 15.1  PLT 141* 129*    Cardiac EnzymesNo results for input(s): TROPONINI in the last 168 hours. No results for input(s): TROPIPOC in the last 168 hours.   BNP Recent Labs  Lab 01/27/24 1400  BNP 606.3*     DDimer No results for input(s): DDIMER in the last 168 hours.   Radiology    ECHOCARDIOGRAM COMPLETE Result Date: 01/28/2024    ECHOCARDIOGRAM REPORT   [name] Date of Exam: 01/28/2024 Medical Rec #:  [id number]    Height:       62.0 in Accession #:    [id number]   Weight:       177.0 lb Date of Birth:  10/10/1924    BSA:  1.815 m Patient Age:    89 years     BP:           131/73 mmHg Patient Gender: F            HR:           101 bpm. Exam Location:  Inpatient Procedure: 2D Echo, Color Doppler and Cardiac Doppler (Both Spectral and Color            Flow Doppler were utilized during procedure). Indications:    Dyspnea R06.00  History:        Patient has prior history of Echocardiogram examinations, most                 recent 01/22/2022. Arrythmias:Atrial Fibrillation; Risk                 Factors:Dyslipidemia and Hypertension.  Sonographer:    Koleen Popper RDCS Referring Phys: [phone] BELKYS A REGALADO IMPRESSIONS  1. Left ventricular ejection fraction, by estimation, is 60 to 65%. The left ventricle has normal function. The left ventricle has no regional wall motion abnormalities. There is moderate left ventricular hypertrophy. Left ventricular diastolic function  could not be evaluated.  2. Right ventricular systolic function is normal. The right ventricular size is normal. There is moderately elevated pulmonary artery systolic pressure.  3. Left atrial size was moderately dilated.  4. Right atrial size was  moderately dilated.  5. There is severe mitral annular calcification with restricted movement of the posterior leaflet with moderate MR. The mitral valve is abnormal. Moderate mitral valve regurgitation. No evidence of mitral stenosis. Severe mitral annular calcification.  6. The aortic valve is tricuspid. There is moderate calcification of the aortic valve. Aortic valve regurgitation is trivial. Aortic valve sclerosis/calcification is present, without any evidence of aortic stenosis.  7. The inferior vena cava is dilated in size with >50% respiratory variability, suggesting right atrial pressure of 8 mmHg. FINDINGS  Left Ventricle: Left ventricular ejection fraction, by estimation, is 60 to 65%. The left ventricle has normal function. The left ventricle has no regional wall motion abnormalities. The left ventricular internal cavity size was normal in size. There is  moderate left ventricular hypertrophy. Left ventricular diastolic function could not be evaluated due to atrial fibrillation. Left ventricular diastolic function could not be evaluated. Right Ventricle: The right ventricular size is normal. No increase in right ventricular wall thickness. Right ventricular systolic function is normal. There is moderately elevated pulmonary artery systolic pressure. The tricuspid regurgitant velocity is 3.27 m/s, and with an assumed right atrial pressure of 8 mmHg, the estimated right ventricular systolic pressure is 50.8 mmHg. Left Atrium: Left atrial size was moderately dilated. Right Atrium: Right atrial size was moderately dilated. Pericardium: There is no evidence of pericardial effusion. Mitral Valve: There is severe mitral annular calcification with restricted movement of the posterior leaflet with moderate MR. The mitral valve is abnormal. There is mild calcification of the mitral valve leaflet(s). Severe mitral annular calcification. Moderate mitral valve regurgitation. No evidence of mitral valve stenosis. MV  peak gradient, 12.8 mmHg. The mean mitral valve gradient is 3.0 mmHg. Tricuspid Valve: The tricuspid valve is normal in structure. Tricuspid valve regurgitation is mild . No evidence of tricuspid stenosis. Aortic Valve: The aortic valve is tricuspid. There is moderate calcification of the aortic valve. Aortic valve regurgitation is trivial. Aortic valve sclerosis/calcification is present, without any evidence of aortic stenosis. Pulmonic Valve: The pulmonic valve was normal in structure. Pulmonic valve  regurgitation is trivial. No evidence of pulmonic stenosis. Aorta: The aortic root is normal in size and structure. Venous: The inferior vena cava is dilated in size with greater than 50% respiratory variability, suggesting right atrial pressure of 8 mmHg. IAS/Shunts: No atrial level shunt detected by color flow Doppler.  LEFT VENTRICLE PLAX 2D LVIDd:         3.30 cm LVIDs:         2.30 cm LV PW:         1.70 cm LV IVS:        1.10 cm LVOT diam:     2.00 cm LV SV:         51 LV SV Index:   28 LVOT Area:     3.14 cm  RIGHT VENTRICLE            IVC RV Basal diam:  3.90 cm    IVC diam: 2.10 cm RV S prime:     9.46 cm/s TAPSE (M-mode): 1.2 cm LEFT ATRIUM             Index        RIGHT ATRIUM           Index LA diam:        3.30 cm 1.82 cm/m   RA Area:     22.60 cm LA Vol (A2C):   60.5 ml 33.33 ml/m  RA Volume:   74.70 ml  41.16 ml/m LA Vol (A4C):   47.9 ml 26.39 ml/m LA Biplane Vol: 53.8 ml 29.64 ml/m  AORTIC VALVE LVOT Vmax:   79.50 cm/s LVOT Vmean:  53.000 cm/s LVOT VTI:    0.162 m  AORTA Ao Root diam: 3.20 cm Ao Asc diam:  3.20 cm MITRAL VALVE             TRICUSPID VALVE MV Area VTI:  1.91 cm   TR Peak grad:   42.8 mmHg MV Peak grad: 12.8 mmHg  TR Vmax:        327.00 cm/s MV Mean grad: 3.0 mmHg MV Vmax:      1.79 m/s   SHUNTS MV Vmean:     73.4 cm/s  Systemic VTI:  0.16 m                          Systemic Diam: 2.00 cm Toribio Fuel MD Electronically signed by Toribio Fuel MD Signature Date/Time:  01/28/2024/9:46:09 AM    Final    CT HEAD WO CONTRAST ( ) Result Date: 01/27/2024 CLINICAL DATA:  Altered mental status, nontraumatic (Ped 0-17y) EXAM: CT HEAD WITHOUT CONTRAST TECHNIQUE: Contiguous axial images were obtained from the base of the skull through the vertex without intravenous contrast. RADIATION DOSE REDUCTION: This exam was performed according to the departmental dose-optimization program which includes automated exposure control, adjustment of the mA and/or kV according to patient size and/or use of iterative reconstruction technique. COMPARISON:  CT of the head dated May 05, 2014. FINDINGS: Brain: Age-related atrophy. Mild to moderate periventricular white matter disease. No evidence of hemorrhage, mass, acute cortical infarct or hydrocephalus. Vascular: Moderate vascular calcifications. Skull: Intact.  No osseous lesions are present. Sinuses/Orbits: The visualized paranasal sinuses are clear. The patient is status post bilateral lens replacement and scleral banding. Other: None. IMPRESSION: 1. Mild-to-moderate periventricular white matter disease. No apparent acute process. Electronically Signed   By: Evalene Coho M.D.   On: 01/27/2024 17:15   DG Chest 2 View Result Date:  01/27/2024 CLINICAL DATA:  Shortness of breath. Atrial fibrillation. 01/20/2022 EXAM: CHEST - 2 VIEW COMPARISON:  01/20/2022 FINDINGS: The cardiac silhouette is borderline enlarged with a mild increase in size. Increased prominence of the pulmonary vasculature. No significant change in mild chronic interstitial prominence and possible tiny bilateral pleural effusions. Stable right shoulder prosthesis. Left shoulder degenerative changes. Thoracic spine degenerative changes. IMPRESSION: 1. Interval borderline cardiomegaly and pulmonary vascular congestion compatible with mild CHF. 2. No significant change in mild chronic interstitial lung disease and possible tiny bilateral pleural effusions. Electronically Signed    By: Elspeth Bathe M.D.   On: 01/27/2024 14:00    Cardiac Studies   Echo as above   Patient Profile     89 y.o. female with a hx of HTN, HLD, CKD stage 3b, hypothyroidism, dementia, macular degeneration, chronic foley, who is being seen 01/27/2024 for the evaluation of new atrial fibrillation with RVR at the request of D. Lowther.   Assessment & Plan    Atrial fib with RVR:   Started on Eliquis .   Increased metoprolol  yesterday.   HR is mildly elevated but I think that she can go home with mildly increased dose of Toprol  XL.        HTN:  As above.   I would stop the lisinopril  at discharge.  I am not sure why she was on the Cardura  and would stop this.  Continue Lasix  and resume the Kdur at discharge.    SOB:  Improved.  Net negative almost 5 liters since admission. Meds as above at discharge.   Murmur:    Echo as above with NO EF.  Moderately elevated pulmonary pressure.   Moderate MR.  Given her advanced age I would suggest medical manatement.    Follow up:  Will be arranged in [place].   For questions or updates, please contact [facility] Please consult [email or web address] for contact info under Cardiology/STEMI.   Signed, Lynwood Schilling, MD  01/29/2024, 7:17 AM

## 2024-01-30 DIAGNOSIS — K5900 Constipation, unspecified: Secondary | ICD-10-CM | POA: Diagnosis not present

## 2024-01-30 DIAGNOSIS — E039 Hypothyroidism, unspecified: Secondary | ICD-10-CM | POA: Diagnosis not present

## 2024-01-30 DIAGNOSIS — M1990 Unspecified osteoarthritis, unspecified site: Secondary | ICD-10-CM | POA: Diagnosis not present

## 2024-01-30 DIAGNOSIS — I5032 Chronic diastolic (congestive) heart failure: Secondary | ICD-10-CM | POA: Diagnosis not present

## 2024-01-30 DIAGNOSIS — E785 Hyperlipidemia, unspecified: Secondary | ICD-10-CM | POA: Diagnosis not present

## 2024-01-30 DIAGNOSIS — N1832 Chronic kidney disease, stage 3b: Secondary | ICD-10-CM | POA: Diagnosis not present

## 2024-01-30 DIAGNOSIS — Z466 Encounter for fitting and adjustment of urinary device: Secondary | ICD-10-CM | POA: Diagnosis not present

## 2024-01-30 DIAGNOSIS — H409 Unspecified glaucoma: Secondary | ICD-10-CM | POA: Diagnosis not present

## 2024-01-30 DIAGNOSIS — D631 Anemia in chronic kidney disease: Secondary | ICD-10-CM | POA: Diagnosis not present

## 2024-01-30 DIAGNOSIS — Z8701 Personal history of pneumonia (recurrent): Secondary | ICD-10-CM | POA: Diagnosis not present

## 2024-01-30 DIAGNOSIS — Z87440 Personal history of urinary (tract) infections: Secondary | ICD-10-CM | POA: Diagnosis not present

## 2024-01-30 DIAGNOSIS — I130 Hypertensive heart and chronic kidney disease with heart failure and stage 1 through stage 4 chronic kidney disease, or unspecified chronic kidney disease: Secondary | ICD-10-CM | POA: Diagnosis not present

## 2024-02-02 DIAGNOSIS — D631 Anemia in chronic kidney disease: Secondary | ICD-10-CM | POA: Diagnosis not present

## 2024-02-02 DIAGNOSIS — M1990 Unspecified osteoarthritis, unspecified site: Secondary | ICD-10-CM | POA: Diagnosis not present

## 2024-02-02 DIAGNOSIS — I130 Hypertensive heart and chronic kidney disease with heart failure and stage 1 through stage 4 chronic kidney disease, or unspecified chronic kidney disease: Secondary | ICD-10-CM | POA: Diagnosis not present

## 2024-02-02 DIAGNOSIS — I5032 Chronic diastolic (congestive) heart failure: Secondary | ICD-10-CM | POA: Diagnosis not present

## 2024-02-02 DIAGNOSIS — N1832 Chronic kidney disease, stage 3b: Secondary | ICD-10-CM | POA: Diagnosis not present

## 2024-02-02 DIAGNOSIS — Z466 Encounter for fitting and adjustment of urinary device: Secondary | ICD-10-CM | POA: Diagnosis not present

## 2024-02-02 LAB — URINE CULTURE: Culture: 40000 — AB

## 2024-02-07 NOTE — Progress Notes (Unsigned)
 Cardiology Office Note   Date:  02/09/2024  ID:  Elwyn, Lowden 03-Jul-1925, MRN [id number] PCP: Seabron Lenis, MD  [facility] Providers Cardiologist:  Lamar Fitch, MD     History of Present Illness BINTA STATZER is a 89 y.o. female with a past medical history of paroxysmal atrial fibrillation, HFpEF, moderate mitral regurgitation, CKD stage IIIb, hypertension, hypothyroidism, chronic Foley catheter follows with Dr. Elisabeth.  01/28/2024 echo EF 60 to 65%, moderate LVH, moderately elevated PASP, biatrial enlargement, moderate MR 01/22/2022 echo EF 55 to 60%, mild LVH, elevated LA pressure, LA mildly dilated, moderate MR 06/06/2014 MPI normal, low risk  Longstanding patient of Dr. Pietro but last evaluated by him in 2018.  Most recently she was admitted [facility] on 01/27/2024 to 01/29/2024--she presented with shortness of breath, tachycardia, EKG revealed A-fib with RVR, she was given IV Cardizem  and transition to metoprolol  XL and started on Eliquis .  She also had a heart failure exacerbation, diuresed with Lasix  and advised to follow back up with cardiology outpatient.  She presents today accompanied by her son for follow-up of her newly diagnosed atrial fibrillation.  It appears she was overall asymptomatic, presented to her PCP for cognitive changes, noted to be in A-fib and was sent to the emergency department, she was also treated for UTI at this time.  She is overall feeling okay, we did talk about different strategies for managing her atrial fibrillation, she is overall feeling well and so the decision was made to proceed with rate control strategies.  She is tolerating her Eliquis  without any adverse side effects.  She has some pedal edema however this is baseline for her and she is wearing compression socks.  She denies chest pain, palpitations, dyspnea, pnd, orthopnea, n, v, dizziness, syncope, weight gain, or early satiety.   ROS: Review of Systems  Constitutional:   Positive for malaise/fatigue.  Cardiovascular:  Positive for leg swelling.  All other systems reviewed and are negative.    Studies Reviewed EKG Interpretation Date/Time:  Tuesday February 09 2024 08:13:10 EDT Ventricular Rate:  79 PR Interval:    QRS Duration:  76 QT Interval:  374 QTC Calculation: 428 R Axis:   44  Text Interpretation: Atrial fibrillation When compared with ECG of 27-Jan-2024 12:46, No significant change was found Confirmed by Carlin Nest [phone]) on 02/09/2024 8:15:59 AM    Cardiac Studies & Procedures   ______________________________________________________________________________________________     ECHOCARDIOGRAM  ECHOCARDIOGRAM COMPLETE 01/28/2024  Narrative ECHOCARDIOGRAM REPORT    Patient Name:   SUMIE REMSEN Scharnhorst Date of Exam: 01/28/2024 Medical Rec #:  [id number]    Height:       62.0 in Accession #:    [id number]   Weight:       177.0 lb Date of Birth:  Jan 06, 1925    BSA:          1.815 m Patient Age:    89 years     BP:           131/73 mmHg Patient Gender: F            HR:           101 bpm. Exam Location:  Inpatient  Procedure: 2D Echo, Color Doppler and Cardiac Doppler (Both Spectral and Color Flow Doppler were utilized during procedure).  Indications:    Dyspnea R06.00  History:        Patient has prior history of Echocardiogram examinations, most recent 01/22/2022. Arrythmias:Atrial Fibrillation;  Risk Factors:Dyslipidemia and Hypertension.  Sonographer:    Koleen Popper RDCS Referring Phys: [phone] BELKYS A REGALADO  IMPRESSIONS   1. Left ventricular ejection fraction, by estimation, is 60 to 65%. The left ventricle has normal function. The left ventricle has no regional wall motion abnormalities. There is moderate left ventricular hypertrophy. Left ventricular diastolic function could not be evaluated. 2. Right ventricular systolic function is normal. The right ventricular size is normal. There is moderately elevated pulmonary artery  systolic pressure. 3. Left atrial size was moderately dilated. 4. Right atrial size was moderately dilated. 5. There is severe mitral annular calcification with restricted movement of the posterior leaflet with moderate MR. The mitral valve is abnormal. Moderate mitral valve regurgitation. No evidence of mitral stenosis. Severe mitral annular calcification. 6. The aortic valve is tricuspid. There is moderate calcification of the aortic valve. Aortic valve regurgitation is trivial. Aortic valve sclerosis/calcification is present, without any evidence of aortic stenosis. 7. The inferior vena cava is dilated in size with >50% respiratory variability, suggesting right atrial pressure of 8 mmHg.  FINDINGS Left Ventricle: Left ventricular ejection fraction, by estimation, is 60 to 65%. The left ventricle has normal function. The left ventricle has no regional wall motion abnormalities. The left ventricular internal cavity size was normal in size. There is moderate left ventricular hypertrophy. Left ventricular diastolic function could not be evaluated due to atrial fibrillation. Left ventricular diastolic function could not be evaluated.  Right Ventricle: The right ventricular size is normal. No increase in right ventricular wall thickness. Right ventricular systolic function is normal. There is moderately elevated pulmonary artery systolic pressure. The tricuspid regurgitant velocity is 3.27 m/s, and with an assumed right atrial pressure of 8 mmHg, the estimated right ventricular systolic pressure is 50.8 mmHg.  Left Atrium: Left atrial size was moderately dilated.  Right Atrium: Right atrial size was moderately dilated.  Pericardium: There is no evidence of pericardial effusion.  Mitral Valve: There is severe mitral annular calcification with restricted movement of the posterior leaflet with moderate MR. The mitral valve is abnormal. There is mild calcification of the mitral valve leaflet(s). Severe  mitral annular calcification. Moderate mitral valve regurgitation. No evidence of mitral valve stenosis. MV peak gradient, 12.8 mmHg. The mean mitral valve gradient is 3.0 mmHg.  Tricuspid Valve: The tricuspid valve is normal in structure. Tricuspid valve regurgitation is mild . No evidence of tricuspid stenosis.  Aortic Valve: The aortic valve is tricuspid. There is moderate calcification of the aortic valve. Aortic valve regurgitation is trivial. Aortic valve sclerosis/calcification is present, without any evidence of aortic stenosis.  Pulmonic Valve: The pulmonic valve was normal in structure. Pulmonic valve regurgitation is trivial. No evidence of pulmonic stenosis.  Aorta: The aortic root is normal in size and structure.  Venous: The inferior vena cava is dilated in size with greater than 50% respiratory variability, suggesting right atrial pressure of 8 mmHg.  IAS/Shunts: No atrial level shunt detected by color flow Doppler.   LEFT VENTRICLE PLAX 2D LVIDd:         3.30 cm LVIDs:         2.30 cm LV PW:         1.70 cm LV IVS:        1.10 cm LVOT diam:     2.00 cm LV SV:         51 LV SV Index:   28 LVOT Area:     3.14 cm   RIGHT VENTRICLE  IVC RV Basal diam:  3.90 cm    IVC diam: 2.10 cm RV S prime:     9.46 cm/s TAPSE (M-mode): 1.2 cm  LEFT ATRIUM             Index        RIGHT ATRIUM           Index LA diam:        3.30 cm 1.82 cm/m   RA Area:     22.60 cm LA Vol (A2C):   60.5 ml 33.33 ml/m  RA Volume:   74.70 ml  41.16 ml/m LA Vol (A4C):   47.9 ml 26.39 ml/m LA Biplane Vol: 53.8 ml 29.64 ml/m AORTIC VALVE LVOT Vmax:   79.50 cm/s LVOT Vmean:  53.000 cm/s LVOT VTI:    0.162 m  AORTA Ao Root diam: 3.20 cm Ao Asc diam:  3.20 cm  MITRAL VALVE             TRICUSPID VALVE MV Area VTI:  1.91 cm   TR Peak grad:   42.8 mmHg MV Peak grad: 12.8 mmHg  TR Vmax:        327.00 cm/s MV Mean grad: 3.0 mmHg MV Vmax:      1.79 m/s   SHUNTS MV Vmean:     73.4  cm/s  Systemic VTI:  0.16 m Systemic Diam: 2.00 cm  Toribio Fuel MD Electronically signed by Toribio Fuel MD Signature Date/Time: 01/28/2024/9:46:09 AM    Final          ______________________________________________________________________________________________      Risk Assessment/Calculations  CHA2DS2-VASc Score = 4   This indicates a 4.8% annual risk of stroke. The patient's score is based upon: CHF History: 0 HTN History: 1 Diabetes History: 0 Stroke History: 0 Vascular Disease History: 0 Age Score: 2 Gender Score: 1            Physical Exam VS:  BP 130/66   Pulse 79   Ht 5' 2 (1.575 m)   Wt 173 lb (78.5 kg)   SpO2 98%   BMI 31.64 kg/m        Wt Readings from Last 3 Encounters:  02/09/24 173 lb (78.5 kg)  01/29/24 177 lb 11.1 oz (80.6 kg)  12/07/22 171 lb 15.3 oz (78 kg)    GEN: Well nourished, well developed in no acute distress, is in a wheelchair NECK: No JVD; No carotid bruits CARDIAC: Irregularly irregular, 3/6 systolic murmur, rubs, gallops RESPIRATORY:  Clear to auscultation without rales, wheezing or rhonchi  ABDOMEN: Soft, non-tender, non-distended EXTREMITIES: No edema appreciated today but she does have compression socks on; No deformity   ASSESSMENT AND PLAN Paroxysmal atrial fibrillation/hypercoagulable state-CHA2DS2-VASc score of 4, she has opted for rate control strategies.  Continue Eliquis  5 mg twice daily--no indication for dose reduction as her weight is 173 and her creatinine is 1.32, denies hematochezia, hematuria, hemoptysis.  Continue Toprol  100 mg daily.  HFpEF-BNP 606 during hospitalization, she was started on Lasix  40 mg daily--will repeat BMET today, continue metoprolol  100 mg daily.  Moderate mitral regurgitation-she has some fatigue, no dizziness, worsening shortness of breath.  Hypertension-blood pressure is controlled at 130/66, continue current antihypertensive regimen       Dispo: BMET today, follow-up  in 6 months.  Signed, Delon JAYSON Hoover, NP

## 2024-02-08 DIAGNOSIS — N1832 Chronic kidney disease, stage 3b: Secondary | ICD-10-CM | POA: Diagnosis not present

## 2024-02-08 DIAGNOSIS — F29 Unspecified psychosis not due to a substance or known physiological condition: Secondary | ICD-10-CM | POA: Diagnosis not present

## 2024-02-08 DIAGNOSIS — I10 Essential (primary) hypertension: Secondary | ICD-10-CM | POA: Diagnosis not present

## 2024-02-08 DIAGNOSIS — I5033 Acute on chronic diastolic (congestive) heart failure: Secondary | ICD-10-CM | POA: Diagnosis not present

## 2024-02-08 DIAGNOSIS — I4891 Unspecified atrial fibrillation: Secondary | ICD-10-CM | POA: Diagnosis not present

## 2024-02-08 DIAGNOSIS — N390 Urinary tract infection, site not specified: Secondary | ICD-10-CM | POA: Diagnosis not present

## 2024-02-09 ENCOUNTER — Ambulatory Visit: Attending: Cardiology | Admitting: Cardiology

## 2024-02-09 ENCOUNTER — Encounter: Payer: Self-pay | Admitting: Cardiology

## 2024-02-09 VITALS — BP 130/66 | HR 79 | Ht 62.0 in | Wt 173.0 lb

## 2024-02-09 DIAGNOSIS — I700 Atherosclerosis of aorta: Secondary | ICD-10-CM | POA: Insufficient documentation

## 2024-02-09 DIAGNOSIS — I480 Paroxysmal atrial fibrillation: Secondary | ICD-10-CM

## 2024-02-09 DIAGNOSIS — Z6836 Body mass index (BMI) 36.0-36.9, adult: Secondary | ICD-10-CM | POA: Insufficient documentation

## 2024-02-09 DIAGNOSIS — E559 Vitamin D deficiency, unspecified: Secondary | ICD-10-CM | POA: Insufficient documentation

## 2024-02-09 DIAGNOSIS — D6859 Other primary thrombophilia: Secondary | ICD-10-CM | POA: Diagnosis present

## 2024-02-09 DIAGNOSIS — I340 Nonrheumatic mitral (valve) insufficiency: Secondary | ICD-10-CM

## 2024-02-09 DIAGNOSIS — I10 Essential (primary) hypertension: Secondary | ICD-10-CM

## 2024-02-09 DIAGNOSIS — N183 Chronic kidney disease, stage 3 unspecified: Secondary | ICD-10-CM | POA: Insufficient documentation

## 2024-02-09 DIAGNOSIS — R32 Unspecified urinary incontinence: Secondary | ICD-10-CM | POA: Insufficient documentation

## 2024-02-09 DIAGNOSIS — M25511 Pain in right shoulder: Secondary | ICD-10-CM | POA: Insufficient documentation

## 2024-02-09 DIAGNOSIS — C4491 Basal cell carcinoma of skin, unspecified: Secondary | ICD-10-CM | POA: Insufficient documentation

## 2024-02-09 DIAGNOSIS — I5032 Chronic diastolic (congestive) heart failure: Secondary | ICD-10-CM | POA: Diagnosis present

## 2024-02-09 DIAGNOSIS — M8580 Other specified disorders of bone density and structure, unspecified site: Secondary | ICD-10-CM | POA: Insufficient documentation

## 2024-02-09 DIAGNOSIS — E66812 Obesity, class 2: Secondary | ICD-10-CM | POA: Insufficient documentation

## 2024-02-09 DIAGNOSIS — F4320 Adjustment disorder, unspecified: Secondary | ICD-10-CM | POA: Insufficient documentation

## 2024-02-09 DIAGNOSIS — E7800 Pure hypercholesterolemia, unspecified: Secondary | ICD-10-CM | POA: Insufficient documentation

## 2024-02-09 DIAGNOSIS — Q525 Fusion of labia: Secondary | ICD-10-CM | POA: Insufficient documentation

## 2024-02-09 DIAGNOSIS — N1832 Chronic kidney disease, stage 3b: Secondary | ICD-10-CM | POA: Insufficient documentation

## 2024-02-09 NOTE — Patient Instructions (Signed)
 Medication Instructions:  Your physician recommends that you continue on your current medications as directed. Please refer to the Current Medication list given to you today.  *If you need a refill on your cardiac medications before your next appointment, please call your pharmacy*  Lab Work: Your physician recommends that you return for lab work in:   Labs today: BMP  If you have labs (blood work) drawn today and your tests are completely normal, you will receive your results only by: MyChart Message (if you have MyChart) OR A paper copy in the mail If you have any lab test that is abnormal or we need to change your treatment, we will call you to review the results.  Testing/Procedures: None  Follow-Up: At [facility], you and your health needs are our priority.  As part of our continuing mission to provide you with exceptional heart care, our providers are all part of one team.  This team includes your primary Cardiologist (physician) and Advanced Practice Providers or APPs (Physician Assistants and Nurse Practitioners) who all work together to provide you with the care you need, when you need it.  Your next appointment:   6 month(s)  Provider:   Lamar Fitch, MD    We recommend signing up for the patient portal called MyChart.  Sign up information is provided on this After Visit Summary.  MyChart is used to connect with patients for Virtual Visits (Telemedicine).  Patients are able to view lab/test results, encounter notes, upcoming appointments, etc.  Non-urgent messages can be sent to your provider as well.   To learn more about what you can do with MyChart, go to [email or web address].   Other Instructions None

## 2024-02-10 ENCOUNTER — Other Ambulatory Visit: Payer: Self-pay

## 2024-02-10 ENCOUNTER — Ambulatory Visit: Payer: Self-pay | Admitting: Cardiology

## 2024-02-10 LAB — BASIC METABOLIC PANEL WITH GFR
BUN/Creatinine Ratio: 25 (ref 12–28)
BUN: 39 mg/dL — ABNORMAL HIGH (ref 10–36)
CO2: 20 mmol/L (ref 20–29)
Calcium: 9.9 mg/dL (ref 8.7–10.3)
Chloride: 102 mmol/L (ref 96–106)
Creatinine, Ser: 1.53 mg/dL — ABNORMAL HIGH (ref 0.57–1.00)
Glucose: 88 mg/dL (ref 70–99)
Potassium: 4 mmol/L (ref 3.5–5.2)
Sodium: 140 mmol/L (ref 134–144)
eGFR: 31 mL/min/1.73 — ABNORMAL LOW

## 2024-02-10 MED ORDER — FUROSEMIDE 40 MG PO TABS: 40.0000 mg | ORAL_TABLET | ORAL | 3 refills | Status: DC

## 2024-02-10 NOTE — Progress Notes (Signed)
 Left message for the patient to call back.

## 2024-02-15 ENCOUNTER — Encounter: Payer: Self-pay | Admitting: Physician Assistant

## 2024-02-15 DIAGNOSIS — N1832 Chronic kidney disease, stage 3b: Secondary | ICD-10-CM | POA: Diagnosis not present

## 2024-02-15 DIAGNOSIS — I130 Hypertensive heart and chronic kidney disease with heart failure and stage 1 through stage 4 chronic kidney disease, or unspecified chronic kidney disease: Secondary | ICD-10-CM | POA: Diagnosis not present

## 2024-02-15 DIAGNOSIS — M1990 Unspecified osteoarthritis, unspecified site: Secondary | ICD-10-CM | POA: Diagnosis not present

## 2024-02-15 DIAGNOSIS — Z466 Encounter for fitting and adjustment of urinary device: Secondary | ICD-10-CM | POA: Diagnosis not present

## 2024-02-15 DIAGNOSIS — I5032 Chronic diastolic (congestive) heart failure: Secondary | ICD-10-CM | POA: Diagnosis not present

## 2024-02-15 DIAGNOSIS — D631 Anemia in chronic kidney disease: Secondary | ICD-10-CM | POA: Diagnosis not present

## 2024-02-17 ENCOUNTER — Ambulatory Visit (INDEPENDENT_AMBULATORY_CARE_PROVIDER_SITE_OTHER): Admitting: Podiatry

## 2024-02-17 ENCOUNTER — Encounter: Payer: Self-pay | Admitting: Podiatry

## 2024-02-17 DIAGNOSIS — B351 Tinea unguium: Secondary | ICD-10-CM | POA: Diagnosis not present

## 2024-02-17 DIAGNOSIS — M79674 Pain in right toe(s): Secondary | ICD-10-CM | POA: Diagnosis not present

## 2024-02-17 DIAGNOSIS — M79675 Pain in left toe(s): Secondary | ICD-10-CM

## 2024-02-17 DIAGNOSIS — D689 Coagulation defect, unspecified: Secondary | ICD-10-CM

## 2024-02-17 NOTE — Progress Notes (Signed)
This patient returns to my office for at risk foot care.  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  This patient is unable to cut nails herself since the patient cannot reach her nails.This patient presents for at risk foot care today.   General Appearance  Alert, conversant and in no acute stress.  Vascular  Dorsalis pedis and posterior tibial  pulses are  weakly palpable  bilaterally.  Capillary return is within normal limits  bilaterally. Temperature is within normal limits  bilaterally.  Neurologic  Senn-Weinstein monofilament wire test within normal limits  bilaterally. Muscle power within normal limits bilaterally.  Nails Thick disfigured discolored nails with subungual debris  from hallux to fifth toes bilaterally. No evidence of bacterial infection or drainage bilaterally.    Orthopedic  No limitations of motion  feet .  No crepitus or effusions noted.  No bony pathology or digital deformities noted.  Skin  normotropic skin with no porokeratosis noted bilaterally.  No signs of infections or ulcers noted.     Onychomycosis  Pain in right toes  Pain in left toes    Consent was obtained for treatment procedures.   Mechanical debridement of nails 1-5  bilaterally performed with a nail nipper.  Filed with dremel without incident.    Return office visit   3 months.                  Told patient to return for periodic foot care and evaluation due to potential at risk complications.   Gardiner Barefoot DPM

## 2024-02-18 ENCOUNTER — Emergency Department (HOSPITAL_COMMUNITY)

## 2024-02-18 ENCOUNTER — Encounter (HOSPITAL_COMMUNITY): Payer: Self-pay | Admitting: Emergency Medicine

## 2024-02-18 ENCOUNTER — Observation Stay (HOSPITAL_COMMUNITY)
Admission: EM | Admit: 2024-02-18 | Discharge: 2024-02-19 | Disposition: A | Discharge status: Home Health | Source: Ambulatory Visit | Attending: Internal Medicine | Admitting: Internal Medicine

## 2024-02-18 DIAGNOSIS — R2689 Other abnormalities of gait and mobility: Secondary | ICD-10-CM | POA: Diagnosis not present

## 2024-02-18 DIAGNOSIS — E039 Hypothyroidism, unspecified: Secondary | ICD-10-CM | POA: Diagnosis not present

## 2024-02-18 DIAGNOSIS — I130 Hypertensive heart and chronic kidney disease with heart failure and stage 1 through stage 4 chronic kidney disease, or unspecified chronic kidney disease: Secondary | ICD-10-CM | POA: Diagnosis not present

## 2024-02-18 DIAGNOSIS — Z978 Presence of other specified devices: Secondary | ICD-10-CM

## 2024-02-18 DIAGNOSIS — N1832 Chronic kidney disease, stage 3b: Secondary | ICD-10-CM | POA: Diagnosis not present

## 2024-02-18 DIAGNOSIS — R531 Weakness: Secondary | ICD-10-CM

## 2024-02-18 DIAGNOSIS — Z960 Presence of urogenital implants: Secondary | ICD-10-CM | POA: Diagnosis not present

## 2024-02-18 DIAGNOSIS — I4891 Unspecified atrial fibrillation: Secondary | ICD-10-CM | POA: Diagnosis not present

## 2024-02-18 DIAGNOSIS — I5032 Chronic diastolic (congestive) heart failure: Secondary | ICD-10-CM | POA: Diagnosis not present

## 2024-02-18 DIAGNOSIS — I517 Cardiomegaly: Secondary | ICD-10-CM | POA: Diagnosis not present

## 2024-02-18 DIAGNOSIS — R0989 Other specified symptoms and signs involving the circulatory and respiratory systems: Secondary | ICD-10-CM | POA: Diagnosis not present

## 2024-02-18 DIAGNOSIS — D696 Thrombocytopenia, unspecified: Secondary | ICD-10-CM | POA: Insufficient documentation

## 2024-02-18 DIAGNOSIS — M7989 Other specified soft tissue disorders: Secondary | ICD-10-CM | POA: Diagnosis not present

## 2024-02-18 DIAGNOSIS — D649 Anemia, unspecified: Secondary | ICD-10-CM | POA: Diagnosis not present

## 2024-02-18 DIAGNOSIS — U071 COVID-19: Secondary | ICD-10-CM | POA: Diagnosis not present

## 2024-02-18 DIAGNOSIS — M1611 Unilateral primary osteoarthritis, right hip: Secondary | ICD-10-CM | POA: Diagnosis not present

## 2024-02-18 DIAGNOSIS — I10 Essential (primary) hypertension: Secondary | ICD-10-CM | POA: Diagnosis not present

## 2024-02-18 DIAGNOSIS — I4819 Other persistent atrial fibrillation: Secondary | ICD-10-CM | POA: Diagnosis not present

## 2024-02-18 DIAGNOSIS — R051 Acute cough: Secondary | ICD-10-CM | POA: Diagnosis not present

## 2024-02-18 DIAGNOSIS — I700 Atherosclerosis of aorta: Secondary | ICD-10-CM | POA: Diagnosis not present

## 2024-02-18 DIAGNOSIS — Z0389 Encounter for observation for other suspected diseases and conditions ruled out: Secondary | ICD-10-CM | POA: Diagnosis not present

## 2024-02-18 DIAGNOSIS — R059 Cough, unspecified: Secondary | ICD-10-CM | POA: Diagnosis present

## 2024-02-18 DIAGNOSIS — H409 Unspecified glaucoma: Secondary | ICD-10-CM | POA: Diagnosis not present

## 2024-02-18 DIAGNOSIS — I480 Paroxysmal atrial fibrillation: Secondary | ICD-10-CM | POA: Diagnosis not present

## 2024-02-18 LAB — CBC WITH DIFFERENTIAL/PLATELET
Abs Immature Granulocytes: 0.05 K/uL (ref 0.00–0.07)
Basophils Absolute: 0.1 K/uL (ref 0.0–0.1)
Basophils Relative: 1 %
Eosinophils Absolute: 0.1 K/uL (ref 0.0–0.5)
Eosinophils Relative: 1 %
HCT: 35.6 % — ABNORMAL LOW (ref 36.0–46.0)
Hemoglobin: 11.1 g/dL — ABNORMAL LOW (ref 12.0–15.0)
Immature Granulocytes: 1 %
Lymphocytes Relative: 7 %
Lymphs Abs: 0.6 K/uL — ABNORMAL LOW (ref 0.7–4.0)
MCH: 31.1 pg (ref 26.0–34.0)
MCHC: 31.2 g/dL (ref 30.0–36.0)
MCV: 99.7 fL (ref 80.0–100.0)
Monocytes Absolute: 0.9 K/uL (ref 0.1–1.0)
Monocytes Relative: 10 %
Neutro Abs: 6.8 K/uL (ref 1.7–7.7)
Neutrophils Relative %: 80 %
Platelets: 136 K/uL — ABNORMAL LOW (ref 150–400)
RBC: 3.57 MIL/uL — ABNORMAL LOW (ref 3.87–5.11)
RDW: 15.1 % (ref 11.5–15.5)
WBC: 8.5 K/uL (ref 4.0–10.5)
nRBC: 0 % (ref 0.0–0.2)

## 2024-02-18 LAB — BASIC METABOLIC PANEL WITH GFR
Anion gap: 9 (ref 5–15)
BUN: 31 mg/dL — ABNORMAL HIGH (ref 8–23)
CO2: 22 mmol/L (ref 22–32)
Calcium: 9.2 mg/dL (ref 8.9–10.3)
Chloride: 103 mmol/L (ref 98–111)
Creatinine, Ser: 1.27 mg/dL — ABNORMAL HIGH (ref 0.44–1.00)
GFR, Estimated: 39 mL/min — ABNORMAL LOW (ref >60)
Glucose, Bld: 121 mg/dL — ABNORMAL HIGH (ref 70–99)
Potassium: 4.8 mmol/L (ref 3.5–5.1)
Sodium: 134 mmol/L — ABNORMAL LOW (ref 135–145)

## 2024-02-18 LAB — TROPONIN I (HIGH SENSITIVITY): Troponin I (High Sensitivity): 9 ng/L (ref <18)

## 2024-02-18 LAB — BRAIN NATRIURETIC PEPTIDE: B Natriuretic Peptide: 574.7 pg/mL — ABNORMAL HIGH (ref 0.0–100.0)

## 2024-02-18 MED ORDER — LEVOTHYROXINE SODIUM 88 MCG PO TABS
88.0000 ug | ORAL_TABLET | Freq: Every day | ORAL | Status: DC
  Administered 2024-02-19: 88 ug via ORAL
  Filled 2024-02-18: qty 1

## 2024-02-18 MED ORDER — ONDANSETRON HCL 4 MG/2ML IJ SOLN: 4.0000 mg | Freq: Four times a day (QID) | INTRAMUSCULAR | Status: DC | PRN

## 2024-02-18 MED ORDER — ALBUTEROL SULFATE (2.5 MG/3ML) 0.083% IN NEBU: 2.5000 mg | INHALATION_SOLUTION | Freq: Four times a day (QID) | RESPIRATORY_TRACT | Status: DC | PRN

## 2024-02-18 MED ORDER — LATANOPROST 0.005 % OP SOLN
1.0000 [drp] | Freq: Every day | OPHTHALMIC | Status: DC
  Administered 2024-02-19: 1 [drp] via OPHTHALMIC
  Filled 2024-02-18: qty 2.5

## 2024-02-18 MED ORDER — FUROSEMIDE 40 MG PO TABS
40.0000 mg | ORAL_TABLET | Freq: Every day | ORAL | Status: DC
  Administered 2024-02-19: 40 mg via ORAL
  Filled 2024-02-18: qty 2
  Filled 2024-02-18: qty 1

## 2024-02-18 MED ORDER — SENNOSIDES-DOCUSATE SODIUM 8.6-50 MG PO TABS: 1.0000 | ORAL_TABLET | Freq: Every evening | ORAL | Status: DC | PRN

## 2024-02-18 MED ORDER — DORZOLAMIDE HCL-TIMOLOL MAL 2-0.5 % OP SOLN
1.0000 [drp] | Freq: Two times a day (BID) | OPHTHALMIC | Status: DC
  Administered 2024-02-19: 1 [drp] via OPHTHALMIC
  Filled 2024-02-18: qty 10

## 2024-02-18 MED ORDER — APIXABAN 5 MG PO TABS: 5.0000 mg | ORAL_TABLET | Freq: Once | ORAL | Status: DC

## 2024-02-18 MED ORDER — METOPROLOL SUCCINATE ER 100 MG PO TB24
100.0000 mg | ORAL_TABLET | Freq: Every day | ORAL | Status: DC
  Administered 2024-02-19: 100 mg via ORAL
  Filled 2024-02-18: qty 1

## 2024-02-18 MED ORDER — APIXABAN 2.5 MG PO TABS
2.5000 mg | ORAL_TABLET | Freq: Once | ORAL | Status: AC
  Administered 2024-02-18: 2.5 mg via ORAL
  Filled 2024-02-18: qty 1

## 2024-02-18 MED ORDER — GUAIFENESIN ER 600 MG PO TB12
600.0000 mg | ORAL_TABLET | Freq: Two times a day (BID) | ORAL | Status: DC
  Administered 2024-02-19 (×2): 600 mg via ORAL
  Filled 2024-02-18 (×2): qty 1

## 2024-02-18 MED ORDER — ONDANSETRON HCL 4 MG PO TABS
4.0000 mg | ORAL_TABLET | Freq: Four times a day (QID) | ORAL | Status: DC | PRN
Start: 2024-02-18 — End: 2024-02-19

## 2024-02-18 MED ORDER — ACETAMINOPHEN 650 MG RE SUPP: 650.0000 mg | Freq: Four times a day (QID) | RECTAL | Status: DC | PRN

## 2024-02-18 MED ORDER — ACETAMINOPHEN 325 MG PO TABS: 650.0000 mg | ORAL_TABLET | Freq: Four times a day (QID) | ORAL | Status: DC | PRN

## 2024-02-18 MED ORDER — APIXABAN 5 MG PO TABS
5.0000 mg | ORAL_TABLET | Freq: Two times a day (BID) | ORAL | Status: DC
  Administered 2024-02-19: 5 mg via ORAL
  Filled 2024-02-18: qty 1

## 2024-02-18 MED ORDER — POTASSIUM CHLORIDE CRYS ER 20 MEQ PO TBCR
20.0000 meq | EXTENDED_RELEASE_TABLET | Freq: Every day | ORAL | Status: DC
  Administered 2024-02-19: 20 meq via ORAL
  Filled 2024-02-18: qty 1

## 2024-02-18 MED ORDER — METOPROLOL TARTRATE 25 MG PO TABS
25.0000 mg | ORAL_TABLET | Freq: Once | ORAL | Status: AC
  Administered 2024-02-18: 25 mg via ORAL
  Filled 2024-02-18: qty 1

## 2024-02-18 MED ORDER — SODIUM CHLORIDE 0.9% FLUSH
3.0000 mL | Freq: Two times a day (BID) | INTRAVENOUS | Status: DC
  Administered 2024-02-19: 3 mL via INTRAVENOUS

## 2024-02-18 NOTE — Hospital Course (Signed)
 Carla Taylor is a 89 y.o. female with medical history significant for recently diagnosed atrial fibrillation now on Eliquis , chronic HFpEF, CKD stage IIIb, HTN, HLD, urinary retention with chronic indwelling Foley catheter, anemia of chronic disease, hypothyroidism who is admitted with generalized weakness in setting of COVID-19 infection associated with subacute on chronic HFpEF and persistent A-fib with intermittent RVR.

## 2024-02-18 NOTE — ED Provider Triage Note (Signed)
 Emergency Medicine Provider Triage Evaluation Note  Carla Taylor , a 89 y.o. female  was evaluated in triage.  Pt complains of [age]-year-old female presenting today with 2-day history of cough and worsening shortness of breath.  COVID-positive.  Her son for concerns for having pleural effusions noted on chest x-ray today by PCP at [facility].  Was told to come into the emergency department due to her worsening shortness of breath and COVID-positive test.  Daily weights have not increased.  Noted to have recently decreased her diuretic.  Chronic leg swelling at baseline and does not seem to have been increased.  Foley catheter in place, last changed 1 week ago.  Denies fever, chest pain, abdominal pain, nausea, vomiting, diarrhea  Review of Systems  Positive: N/a Negative: N/a  Physical Exam  BP (!) 124/97 (BP Location: Right Arm)   Pulse 94   Temp 98.5 F (36.9 C)   Resp 16   SpO2 96%  Gen:   Awake, no distress   Resp:  Normal effort  MSK:   Moves extremities without difficulty  Other:    Medical Decision Making  Medically screening exam initiated at 7:57 PM.  Appropriate orders placed.  Carla Taylor was informed that the remainder of the evaluation will be completed by another provider, this initial triage assessment does not replace that evaluation, and the importance of remaining in the ED until their evaluation is complete.     Carla Taylor, [place] 02/18/24 2001

## 2024-02-18 NOTE — H&P (Incomplete)
 History and Physical    Carla Taylor FMW:[id number] DOB: Jan 27, 1925 DOA: 02/18/2024  PCP: Seabron Lenis, MD  Patient coming from: Home  I have personally briefly reviewed patient's old medical records in [facility]  Chief Complaint: Cough, generalized weakness, dyspnea  HPI: Carla Taylor is a 89 y.o. female with medical history significant for recently diagnosed atrial fibrillation now on Eliquis , chronic HFpEF, CKD stage IIIb, HTN, HLD, urinary retention with chronic indwelling Foley catheter, anemia of chronic disease, hypothyroidism who presented to the ED for evaluation of generalized weakness, dyspnea, in setting of positive COVID-19 test.  Patient recently admitted 7/9-7/11 for new onset A-fib with RVR initially treated with IV diltiazem  with improvement in heart rate.  She was seen by cardiology and started on Toprol -XL and Eliquis .  She was also felt to have component of acute on chronic HFpEF and given IV Lasix  initially then transition to oral Lasix .  She was also noted to have worsening cognition for the 2 weeks prior.  She was treated for UTI after urine culture grew E. coli.  Patient states that she was feeling fairly well when she left the hospital.  She had follow-up with cardiology in clinic on 7/22.  She was advised to switch taking Lasix  from 40 mg daily to every other day.  Over the last 3 days patient has developed a new nonproductive cough.  These last few days she has been very fatigued and has not felt like doing anything.  She has been generally weak.  Today she developed new exertional dyspnea.  She has not had fevers, chills, diaphoresis, palpitations.  She has had slightly decreased urine output than expected.  She has chronic swelling to her lower extremities which is unchanged from baseline and improved with use of compression stockings and elevation.  Normally she ambulates on her own in the house and outside the house will use walker and sometimes wheelchair  for longer distances.  Her she lives with her son who states that over the last month she has been requiring a wheelchair more often.  She was seen in the [place] walk-in clinic today for further evaluation.  She was sent to the ED for further evaluation and reportedly tested positive for COVID-19.  ED Course  Labs/Imaging on admission: I have personally reviewed following labs and imaging studies.  Initial vitals showed BP 124/97, pulse 94, RR 16, temp 98.5 F, SpO2 96% on room air.  Pulse has been variable from 90s-120s while in A-fib.  Labs showed BNP 574.7, troponin 9, sodium 134, potassium 4.8, bicarb 22, BUN 31, creatinine 1.27, serum glucose 121, WBC 8.5, hemoglobin 11.1, platelets 136.  2 view chest x-ray showed increased central vascular congestion when compared to previous.  No sizable effusion.  Patient was given oral Lopressor  25 mg and home dose Eliquis  2.5 mg.  The hospitalist service was consulted for admission.  Review of Systems: All systems reviewed and are negative except as documented in history of present illness above.   Past Medical History:  Diagnosis Date  . Arthritis   . Hyperlipidemia   . Hypertension   . Hypothyroid   . Macular degeneration   . Pancreatitis   . Primary localized osteoarthritis of right knee 02/05/2016  . 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 Stress bladder incontinence, female     Past Surgical History:  Procedure Laterality Date  . carpal tunnel Bilateral   . CATARACT EXTRACTION Bilateral    2005  . EYE SURGERY    .  KNEE ARTHROSCOPY Bilateral    2001  . No previous surgeries    . TOTAL KNEE ARTHROPLASTY Right 02/05/2016  . TOTAL KNEE ARTHROPLASTY Right 02/05/2016   Procedure: RIGHT TOTAL KNEE ARTHROPLASTY;  Surgeon: Fonda Olmsted, MD;  Location: [facility] OR;  Service: Orthopedics;  Laterality: Right;  . TOTAL SHOULDER ARTHROPLASTY Right 09/19/2014   Procedure: RIGHT TOTAL SHOULDER ARTHROPLASTY;  Surgeon: Fonda SHAUNNA Olmsted,  MD;  Location: [facility] OR;  Service: Orthopedics;  Laterality: Right;    Social History: Social History   Tobacco Use  . Smoking status: Never  . Smokeless tobacco: Never  Substance Use Topics  . Alcohol  use: No    Alcohol /week: 0.0 standard drinks of alcohol   . Drug use: No   No Known Allergies  Family History  Problem Relation Age of Onset  . CVA Mother   . CVA Father      Prior to Admission medications   Medication Sig Start Date End Date Taking? Authorizing Provider  acetaminophen  (TYLENOL ) 650 MG CR tablet Take 1,300 mg by mouth every 6 (six) hours as needed for pain.    [provider]  apixaban  (ELIQUIS ) 5 MG TABS tablet Take 1 tablet (5 mg total) by mouth 2 (two) times daily. 01/29/24   Regalado, Belkys A, MD  BESIVANCE 0.6 % SUSP Place 4 drops into both eyes See admin instructions. 4 drops before shot & 4 drops after shot 12/04/23   [provider]  Calcium  Carb-Cholecalciferol (CALCIUM  600 + D PO) Take 600 mg by mouth in the morning and at bedtime.    [provider]  dorzolamide -timolol  (COSOPT ) 22.3-6.8 MG/ML ophthalmic solution Place 1 drop into both eyes 2 (two) times daily. 01/15/22   [provider]  doxazosin  (CARDURA ) 2 MG tablet TAKE 1 TABLET BY MOUTH  DAILY  PLEASE OBTAIN FUTURE  REFILLS FROM PCP. Patient taking differently: Take 2 mg by mouth daily. 02/19/16   Pietro Redell RAMAN, MD  furosemide  (LASIX ) 40 MG tablet Take 1 tablet (40 mg total) by mouth every other day. 02/10/24   Carlin Delon BROCKS, NP  latanoprost  (XALATAN ) 0.005 % ophthalmic solution Place 1 drop into the left eye at bedtime.    [provider]  levothyroxine  (SYNTHROID , LEVOTHROID) 88 MCG tablet Take 88 mcg by mouth daily before breakfast.    [provider]  metoprolol  succinate (TOPROL -XL) 100 MG 24 hr tablet Take 1 tablet (100 mg total) by mouth daily. Take with or immediately following a meal. 01/29/24   Regalado, Belkys A, MD  Multiple  Vitamins-Minerals (ICAPS AREDS FORMULA PO) Take 1 tablet by mouth 2 (two) times daily.    [provider]  Polyethyl Glycol-Propyl Glycol (SYSTANE OP) Place 1 drop into both eyes as needed (dryness/itching).    [provider]  potassium chloride  SA (KLOR-CON  M) 20 MEQ tablet Take 1 tablet (20 mEq total) by mouth daily. 01/29/24 04/28/24  Regalado, Owen A, MD  sennosides-docusate sodium  (SENOKOT-S) 8.6-50 MG tablet Take 1 tablet by mouth daily as needed for constipation.    [provider]  vitamin B-12 (CYANOCOBALAMIN ) 500 MCG tablet Take 500 mcg by mouth daily.    [provider]  Vitamin D , Ergocalciferol , (DRISDOL ) 50000 UNITS CAPS capsule Take 50,000 Units by mouth every Sunday.    [provider]    Physical Exam: Vitals:   02/18/24 1945 02/18/24 2315 02/18/24 2316 02/18/24 2330  BP: (!) 124/97 113/77  (!) 130/92  Pulse: 94 (!) 116  (!) 104  Resp:  16 (!) 23  (!) 24  Temp: 98.5 F (36.9 C)  98.2 F (36.8 C)   SpO2: 96% 95%  98%   Constitutional: Elderly woman resting in bed with head elevated.  NAD, calm, comfortable Eyes: EOMI, lids and conjunctivae normal ENMT: Mucous membranes are moist. Posterior pharynx clear of any exudate or lesions.Normal dentition.  Neck: normal, supple, no masses. Respiratory: clear to auscultation bilaterally, no wheezing, no crackles. Normal respiratory effort while on 2 L O2 [place]. No accessory muscle use.  Cardiovascular: Irregularly irregular, no murmurs / rubs / gallops.  Trace bilateral lower extremity edema, compression stockings in place. 2+ pedal pulses. Abdomen: no tenderness, no masses palpated. Musculoskeletal: no clubbing / cyanosis. No joint deformity upper and lower extremities. Good ROM, no contractures. Normal muscle tone.  Skin: no rashes, lesions, ulcers. No induration Neurologic: Sensation intact. Strength 5/5 in all 4.  Psychiatric: Normal judgment and insight. Alert and oriented x 3. Normal  mood.   EKG: Personally reviewed.  Atrial fibrillation RVR, rate 123.  Rate is faster when compared to previous.  Assessment/Plan Principal Problem:   COVID-19 virus infection Active Problems:   Persistent atrial fibrillation (HCC)   Chronic heart failure with preserved ejection fraction (HFpEF, >= 50%) (HCC)   Chronic kidney disease, stage 3b (HCC)   Essential hypertension   Hypothyroidism   Thrombocytopenia (HCC)   Chronic indwelling Foley catheter   Normocytic anemia   Generalized weakness   Carla Taylor is a 89 y.o. female with medical history significant for recently diagnosed atrial fibrillation now on Eliquis , chronic HFpEF, CKD stage IIIb, HTN, HLD, urinary retention with chronic indwelling Foley catheter, anemia of chronic disease, hypothyroidism who is admitted with generalized weakness in setting of COVID-19 infection associated with subacute on chronic HFpEF and persistent A-fib with intermittent RVR. *** Assessment and Plan: Generalized weakness due to COVID-19 infection: Reportedly tested positive for COVID-19 in [place] walk-in clinic 7/31.  Has associated generalized weakness, fatigue, nonproductive cough.  Has been afebrile.  She has not been hypoxic but was placed on 2 L [place] in the ED for comfort.  CXR more consistent with vascular congestion rather than viral pneumonia changes. - Continue supportive care - PT/OT eval - Supplemental oxygen as needed - IS, FV, Mucinex   Persistent atrial fibrillation: Recently diagnosed with A-fib 3 weeks ago and remains in atrial fibrillation this admission with variable rate.  Chronic HFpEF: ***  CKD stage IIIb: ***  Thrombocytopenia: Mild without obvious bleeding.  Monitor while on Eliquis .  Hypertension: ***  Chronic urinary retention with chronic indwelling Foley catheter: ***  Hypothyroidism: ***  Normocytic anemia: Chronic and stable.  Hyperlipidemia: ***    DVT prophylaxis: ***  Code Status: ***  Family  Communication: ***  Disposition Plan: ***  Consults called: ***  Severity of Illness: The appropriate patient status for this patient is OBSERVATION. Observation status is judged to be reasonable and necessary in order to provide the required intensity of service to ensure the patient's safety. The patient's presenting symptoms, physical exam findings, and initial radiographic and laboratory data in the context of their medical condition is felt to place them at decreased risk for further clinical deterioration. Furthermore, it is anticipated that the patient will be medically stable for discharge from the hospital within 2 midnights of admission.   Carla Blanch MD [facility]  If 7PM-7AM, please contact night-coverage [email or web address]  02/19/2024, 12:01 AM

## 2024-02-18 NOTE — ED Notes (Signed)
 Patient transported to X-ray

## 2024-02-18 NOTE — H&P (Signed)
 History and Physical    Carla Taylor FMW:[id number] DOB: 13-Mar-1925 DOA: 02/18/2024  PCP: Seabron Lenis, MD  Patient coming from: Home  I have personally briefly reviewed patient's old medical records in [facility] Link  Chief Complaint: Cough, generalized weakness, dyspnea  HPI: Carla Taylor is a 89 y.o. female with medical history significant for recently diagnosed atrial fibrillation now on Eliquis , chronic HFpEF, CKD stage IIIb, HTN, HLD, urinary retention with chronic indwelling Foley catheter, anemia of chronic disease, hypothyroidism who presented to the ED for evaluation of generalized weakness, dyspnea, in setting of positive COVID-19 test.  Patient recently admitted 7/9-7/11 for new onset A-fib with RVR initially treated with IV diltiazem  with improvement in heart rate.  She was seen by cardiology and started on Toprol -XL and Eliquis .  She was also felt to have component of acute on chronic HFpEF and given IV Lasix  initially then transition to oral Lasix .  She was also noted to have worsening cognition for the 2 weeks prior.  She was treated for UTI after urine culture grew E. coli.  Patient states that she was feeling fairly well when she left the hospital.  She had follow-up with cardiology in clinic on 7/22.  She was advised to switch taking Lasix  from 40 mg daily to every other day.  Over the last 3 days patient has developed a new nonproductive cough.  These last few days she has been very fatigued and has not felt like doing anything.  She has been generally weak.  Today she developed new exertional dyspnea.  She has not had fevers, chills, diaphoresis, palpitations.  She has had slightly decreased urine output than expected.  She has chronic swelling to her lower extremities which is unchanged from baseline and improved with use of compression stockings and elevation.  Normally she ambulates on her own in the house and outside the house will use walker and sometimes wheelchair  for longer distances.  Her she lives with her son who states that over the last month she has been requiring a wheelchair more often.  She was seen in the [place] walk-in clinic today for further evaluation.  She was sent to the ED for further evaluation and reportedly tested positive for COVID-19.  ED Course  Labs/Imaging on admission: I have personally reviewed following labs and imaging studies.  Initial vitals showed BP 124/97, pulse 94, RR 16, temp 98.5 F, SpO2 96% on room air.  Pulse has been variable from 90s-120s while in A-fib.  Labs showed BNP 574.7, troponin 9, sodium 134, potassium 4.8, bicarb 22, BUN 31, creatinine 1.27, serum glucose 121, WBC 8.5, hemoglobin 11.1, platelets 136.  2 view chest x-ray showed increased central vascular congestion when compared to previous.  No sizable effusion.  Patient was given oral Lopressor  25 mg and home dose Eliquis  2.5 mg.  The hospitalist service was consulted for admission.  Review of Systems: All systems reviewed and are negative except as documented in history of present illness above.   Past Medical History:  Diagnosis Date   Arthritis    Hyperlipidemia    Hypertension    Hypothyroid    Macular degeneration    Pancreatitis    Primary localized osteoarthritis of right knee 02/05/2016   Primary osteoarthritis of right shoulder 09/19/2014   Stress bladder incontinence, female     Past Surgical History:  Procedure Laterality Date   carpal tunnel Bilateral    CATARACT EXTRACTION Bilateral    2005   EYE SURGERY  KNEE ARTHROSCOPY Bilateral    2001   No previous surgeries     TOTAL KNEE ARTHROPLASTY Right 02/05/2016   TOTAL KNEE ARTHROPLASTY Right 02/05/2016   Procedure: RIGHT TOTAL KNEE ARTHROPLASTY;  Surgeon: Fonda Olmsted, MD;  Location: [facility] OR;  Service: Orthopedics;  Laterality: Right;   TOTAL SHOULDER ARTHROPLASTY Right 09/19/2014   Procedure: RIGHT TOTAL SHOULDER ARTHROPLASTY;  Surgeon: Fonda SHAUNNA Olmsted, MD;  Location: [facility]  OR;  Service: Orthopedics;  Laterality: Right;    Social History: Social History   Tobacco Use   Smoking status: Never   Smokeless tobacco: Never  Substance Use Topics   Alcohol  use: No    Alcohol /week: 0.0 standard drinks of alcohol    Drug use: No   No Known Allergies  Family History  Problem Relation Age of Onset   CVA Mother    CVA Father      Prior to Admission medications   Medication Sig Start Date End Date Taking? Authorizing Provider  acetaminophen  (TYLENOL ) 650 MG CR tablet Take 1,300 mg by mouth every 6 (six) hours as needed for pain.    [provider]  apixaban  (ELIQUIS ) 5 MG TABS tablet Take 1 tablet (5 mg total) by mouth 2 (two) times daily. 01/29/24   Regalado, Belkys A, MD  BESIVANCE 0.6 % SUSP Place 4 drops into both eyes See admin instructions. 4 drops before shot & 4 drops after shot 12/04/23   [provider]  Calcium  Carb-Cholecalciferol (CALCIUM  600 + D PO) Take 600 mg by mouth in the morning and at bedtime.    [provider]  dorzolamide -timolol  (COSOPT ) 22.3-6.8 MG/ML ophthalmic solution Place 1 drop into both eyes 2 (two) times daily. 01/15/22   [provider]  doxazosin  (CARDURA ) 2 MG tablet TAKE 1 TABLET BY MOUTH  DAILY  PLEASE OBTAIN FUTURE  REFILLS FROM PCP. Patient taking differently: Take 2 mg by mouth daily. 02/19/16   Pietro Redell RAMAN, MD  furosemide  (LASIX ) 40 MG tablet Take 1 tablet (40 mg total) by mouth every other day. 02/10/24   Carlin Delon BROCKS, NP  latanoprost  (XALATAN ) 0.005 % ophthalmic solution Place 1 drop into the left eye at bedtime.    [provider]  levothyroxine  (SYNTHROID , LEVOTHROID) 88 MCG tablet Take 88 mcg by mouth daily before breakfast.    [provider]  metoprolol  succinate (TOPROL -XL) 100 MG 24 hr tablet Take 1 tablet (100 mg total) by mouth daily. Take with or immediately following a meal. 01/29/24   Regalado, Belkys A, MD  Multiple Vitamins-Minerals (ICAPS AREDS  FORMULA PO) Take 1 tablet by mouth 2 (two) times daily.    [provider]  Polyethyl Glycol-Propyl Glycol (SYSTANE OP) Place 1 drop into both eyes as needed (dryness/itching).    [provider]  potassium chloride  SA (KLOR-CON  M) 20 MEQ tablet Take 1 tablet (20 mEq total) by mouth daily. 01/29/24 04/28/24  Regalado, Belkys A, MD  sennosides-docusate sodium  (SENOKOT-S) 8.6-50 MG tablet Take 1 tablet by mouth daily as needed for constipation.    [provider]  vitamin B-12 (CYANOCOBALAMIN ) 500 MCG tablet Take 500 mcg by mouth daily.    [provider]  Vitamin D , Ergocalciferol , (DRISDOL ) 50000 UNITS CAPS capsule Take 50,000 Units by mouth every Sunday.    [provider]    Physical Exam: Vitals:   02/18/24 1945 02/18/24 2315 02/18/24 2316 02/18/24 2330  BP: (!) 124/97 113/77  (!) 130/92  Pulse: 94 (!) 116  (!) 104  Resp:  16 (!) 23  (!) 24  Temp: 98.5 F (36.9 C)  98.2 F (36.8 C)   SpO2: 96% 95%  98%   Constitutional: Elderly woman resting in bed with head elevated.  NAD, calm, comfortable Eyes: EOMI, lids and conjunctivae normal ENMT: Mucous membranes are moist. Posterior pharynx clear of any exudate or lesions.Normal dentition.  Neck: normal, supple, no masses. Respiratory: clear to auscultation bilaterally, no wheezing, no crackles. Normal respiratory effort while on 2 L O2 [place]. No accessory muscle use.  Cardiovascular: Irregularly irregular, no murmurs / rubs / gallops.  Trace bilateral lower extremity edema, compression stockings in place. 2+ pedal pulses. Abdomen: no tenderness, no masses palpated. Musculoskeletal: no clubbing / cyanosis. No joint deformity upper and lower extremities. Good ROM, no contractures. Normal muscle tone.  Skin: no rashes, lesions, ulcers. No induration Neurologic: Sensation intact. Strength 5/5 in all 4.  Psychiatric: Normal judgment and insight. Alert and oriented x 3. Normal mood.   EKG: Personally  reviewed.  Atrial fibrillation RVR, rate 123.  Rate is faster when compared to previous.  Assessment/Plan Principal Problem:   COVID-19 virus infection Active Problems:   Persistent atrial fibrillation (HCC)   Chronic heart failure with preserved ejection fraction (HFpEF, >= 50%) (HCC)   Chronic kidney disease, stage 3b (HCC)   Essential hypertension   Hypothyroidism   Thrombocytopenia (HCC)   Chronic indwelling Foley catheter   Normocytic anemia   Generalized weakness   Carla Taylor is a 89 y.o. female with medical history significant for recently diagnosed atrial fibrillation now on Eliquis , chronic HFpEF, CKD stage IIIb, HTN, HLD, urinary retention with chronic indwelling Foley catheter, anemia of chronic disease, hypothyroidism who is admitted with generalized weakness in setting of COVID-19 infection associated with subacute on chronic HFpEF and persistent A-fib with intermittent RVR.  Assessment and Plan: Generalized weakness due to COVID-19 infection: Reportedly tested positive for COVID-19 in [place] walk-in clinic 7/31.  Has associated generalized weakness, fatigue, nonproductive cough.  Has been afebrile.  She has not been hypoxic but was placed on 2 L [place] in the ED for comfort.  CXR more consistent with vascular congestion rather than viral pneumonia changes. - Continue supportive care - PT/OT eval - Supplemental oxygen as needed - IS, FV, Mucinex   Persistent atrial fibrillation: Recently diagnosed with A-fib 3 weeks ago and remains in atrial fibrillation this admission with variable rate 90-120s. - Resume Toprol -XL 100 mg daily - Continue Eliquis   Chronic HFpEF: Likely some component of subacute CHF contributing to symptoms.  CXR with increased central vascular congestion.  BNP 574.  Recently her home Lasix  was changed from 40 mg daily to every other day dosing. TTE 01/28/2024 showed EF 60-65%, moderate LVH, moderately elevated PASP, moderate MR. - Start back on po Lasix  40  mg daily including tonight - Continue Toprol -XL - Monitor I/O's  CKD stage IIIb: Renal function stable.  Continue to monitor.  Thrombocytopenia: Mild without obvious bleeding.  Monitor while on Eliquis .  Hypertension: Continue Toprol -XL and Lasix .  Cardura  on hold for now.  Chronic urinary retention with chronic indwelling Foley catheter: Continue Foley care.  Hypothyroidism: Continue Synthroid .  Normocytic anemia: Chronic and stable.   DVT prophylaxis:  apixaban  (ELIQUIS ) tablet 5 mg   Code Status:   Code Status: Limited: Do not attempt resuscitation (DNR) -DNR-LIMITED -Do Not Intubate/DNI   confirmed with patient on admission. Family Communication: Son at bedside Disposition Plan: From home, dispo pending clinical progress Consults called: None Severity of Illness: The appropriate  patient status for this patient is OBSERVATION. Observation status is judged to be reasonable and necessary in order to provide the required intensity of service to ensure the patient's safety. The patient's presenting symptoms, physical exam findings, and initial radiographic and laboratory data in the context of their medical condition is felt to place them at decreased risk for further clinical deterioration. Furthermore, it is anticipated that the patient will be medically stable for discharge from the hospital within 2 midnights of admission.   Jorie Blanch MD [facility]  If 7PM-7AM, please contact night-coverage [email or web address]  02/19/2024, 12:07 AM

## 2024-02-18 NOTE — ED Triage Notes (Addendum)
 Cough x 2 days. Took to PCP and covid positive. Concern over cough and xray chest. Lasix  decreased to every other day starting Thursday. Worsening SOB. Walks with walker. No chest pain just SOB. No fevers at home. Swelling in bilateral extremities. Weighs herself everyday and weights have not increased. Indwelling foley changed last week.

## 2024-02-18 NOTE — ED Notes (Signed)
 Pt back from XR

## 2024-02-18 NOTE — ED Provider Notes (Signed)
 [place] [facility] AT [facility] Provider Note   CSN: [id number] Arrival date & time: 02/18/24  8077     Patient presents with: No chief complaint on file.   Carla Taylor is a 89 y.o. female.   Pt is a 89 y.o. female with medical history significant of hypertension, hypothyroidism, macular degeneration, Chronic foley catheter. Arthritis who is presenting today due to exertional dyspnea, fatigue, cough and nasal congestion.  The cough and nasal congestion started 2 days ago and she was doing okay but today really started going downhill.  Reports she did not even have the energy to make it across 2 rooms.  She would have to sit down to rest and would just feel wiped out.  Denies any shortness of breath at rest.  No chest pain.  No abdominal pain, nausea vomiting or diarrhea.  Patient denies any shortness of breath at rest.  She did recently 1 week ago change her Lasix  from when she was discharged to the hospital and is now taking it every other day.  She does not feel like the swelling in her legs has gotten any worse.  She went to her doctor today and she tested positive for COVID at her doctor's office.  She denies any sputum production but has felt cold.  She is unaware if she has been running a fever.  Also tonight has been the first night that she has not eaten dinner otherwise her son reports that she has been eating well.  The history is provided by the patient, a relative and medical records.       Prior to Admission medications   Medication Sig Start Date End Date Taking? Authorizing Provider  acetaminophen  (TYLENOL ) 650 MG CR tablet Take 1,300 mg by mouth every 6 (six) hours as needed for pain.    [provider]  apixaban  (ELIQUIS ) 5 MG TABS tablet Take 1 tablet (5 mg total) by mouth 2 (two) times daily. 01/29/24   Regalado, Belkys A, MD  BESIVANCE 0.6 % SUSP Place 4 drops into both eyes See admin instructions. 4 drops before shot & 4 drops after shot  12/04/23   [provider]  Calcium  Carb-Cholecalciferol (CALCIUM  600 + D PO) Take 600 mg by mouth in the morning and at bedtime.    [provider]  dorzolamide -timolol  (COSOPT ) 22.3-6.8 MG/ML ophthalmic solution Place 1 drop into both eyes 2 (two) times daily. 01/15/22   [provider]  doxazosin  (CARDURA ) 2 MG tablet TAKE 1 TABLET BY MOUTH  DAILY  PLEASE OBTAIN FUTURE  REFILLS FROM PCP. Patient taking differently: Take 2 mg by mouth daily. 02/19/16   Pietro Redell RAMAN, MD  furosemide  (LASIX ) 40 MG tablet Take 1 tablet (40 mg total) by mouth every other day. 02/10/24   Carlin Delon BROCKS, NP  latanoprost  (XALATAN ) 0.005 % ophthalmic solution Place 1 drop into the left eye at bedtime.    [provider]  levothyroxine  (SYNTHROID , LEVOTHROID) 88 MCG tablet Take 88 mcg by mouth daily before breakfast.    [provider]  metoprolol  succinate (TOPROL -XL) 100 MG 24 hr tablet Take 1 tablet (100 mg total) by mouth daily. Take with or immediately following a meal. 01/29/24   Regalado, Belkys A, MD  Multiple Vitamins-Minerals (ICAPS AREDS FORMULA PO) Take 1 tablet by mouth 2 (two) times daily.    [provider]  Polyethyl Glycol-Propyl Glycol (SYSTANE OP) Place 1 drop into both eyes as needed (dryness/itching).  [provider]  potassium chloride  SA (KLOR-CON  M) 20 MEQ tablet Take 1 tablet (20 mEq total) by mouth daily. 01/29/24 04/28/24  Regalado, Belkys A, MD  sennosides-docusate sodium  (SENOKOT-S) 8.6-50 MG tablet Take 1 tablet by mouth daily as needed for constipation.    [provider]  vitamin B-12 (CYANOCOBALAMIN ) 500 MCG tablet Take 500 mcg by mouth daily.    [provider]  Vitamin D , Ergocalciferol , (DRISDOL ) 50000 UNITS CAPS capsule Take 50,000 Units by mouth every Sunday.    [provider]    Allergies: Patient has no known allergies.    Review of Systems  Updated Vital Signs BP (!) 124/97 (BP  Location: Right Arm)   Pulse 94   Temp 98.5 F (36.9 C)   Resp 16   SpO2 96%   Physical Exam Vitals and nursing note reviewed.  Constitutional:      General: She is not in acute distress.    Appearance: She is well-developed.  HENT:     Head: Normocephalic and atraumatic.  Eyes:     Pupils: Pupils are equal, round, and reactive to light.  Cardiovascular:     Rate and Rhythm: Normal rate. Rhythm irregularly irregular.     Heart sounds: Normal heart sounds. No murmur heard.    No friction rub.  Pulmonary:     Effort: Pulmonary effort is normal.     Breath sounds: Normal breath sounds. No wheezing or rales.  Abdominal:     General: Bowel sounds are normal. There is no distension.     Palpations: Abdomen is soft.     Tenderness: There is no abdominal tenderness. There is no guarding or rebound.  Genitourinary:    Comments: Foley catheter in place Musculoskeletal:        General: No tenderness. Normal range of motion.     Right lower leg: Edema present.     Left lower leg: Edema present.     Comments: No edema  Skin:    General: Skin is warm and dry.     Findings: No rash.  Neurological:     Mental Status: She is alert and oriented to person, place, and time. Mental status is at baseline.     Cranial Nerves: No cranial nerve deficit.  Psychiatric:        Mood and Affect: Mood normal.        Behavior: Behavior normal.     (all labs ordered are listed, but only abnormal results are displayed) Labs Reviewed  CBC WITH DIFFERENTIAL/PLATELET - Abnormal; Notable for the following components:      Result Value   RBC 3.57 (*)    Hemoglobin 11.1 (*)    HCT 35.6 (*)    Platelets 136 (*)    Lymphs Abs 0.6 (*)    All other components within normal limits  BASIC METABOLIC PANEL WITH GFR - Abnormal; Notable for the following components:   Sodium 134 (*)    Glucose, Bld 121 (*)    BUN 31 (*)    Creatinine, Ser 1.27 (*)    GFR, Estimated 39 (*)    All other components within  normal limits  BRAIN NATRIURETIC PEPTIDE - Abnormal; Notable for the following components:   B Natriuretic Peptide 574.7 (*)    All other components within normal limits  TROPONIN I (HIGH SENSITIVITY)  TROPONIN I (HIGH SENSITIVITY)    EKG: EKG Interpretation Date/Time:  Thursday February 18 2024 20:05:26 EDT Ventricular Rate:  123 PR Interval:  QRS Duration:  70 QT Interval:  308 QTC Calculation: 440 R Axis:   64  Text Interpretation: Atrial fibrillation with rapid ventricular response Anterior infarct , age undetermined ST & T wave abnormality, consider inferior ischemia No significant change since last tracing When compared with ECG of 09-Feb-2024 08:13, PREVIOUS ECG IS PRESENT Confirmed by Doretha Folks ([id number]) on 02/18/2024 8:28:08 PM  Radiology: [facility] Chest 2 View Result Date: 02/18/2024 CLINICAL DATA:  Evaluate for pleural effusion EXAM: CHEST - 2 VIEW COMPARISON:  01/27/2024 FINDINGS: Cardiac shadow remains enlarged. Aortic calcifications are again seen. Increase in the degree of central vascular congestion is noted without significant edema. No sizable effusion is seen. No focal infiltrate is noted. No bony abnormality is seen. IMPRESSION: Increased central vascular congestion when compared with the prior exam. No sizable effusion is seen. Electronically Signed   By: Oneil Devonshire M.D.   On: 02/18/2024 21:02     Procedures   Medications Ordered in the ED  apixaban  (ELIQUIS ) tablet 2.5 mg (has no administration in time range)  metoprolol  tartrate (LOPRESSOR ) tablet 25 mg (has no administration in time range)                                    Medical Decision Making Amount and/or Complexity of Data Reviewed Independent Historian: caregiver External Data Reviewed: notes. Labs: ordered. Decision-making details documented in ED Course. Radiology: ordered and independent interpretation performed. Decision-making details documented in ED Course. ECG/medicine tests: ordered  and independent interpretation performed. Decision-making details documented in ED Course.  Risk Prescription drug management. Decision regarding hospitalization.   Pt with multiple medical problems and comorbidities and presenting today with a complaint that caries a high risk for morbidity and mortality.  Here today with the above complaints.  Concern for pneumonia, CHF exacerbation due to recent COVID, symptoms from COVID.  Also given patient's recent initiation of Lasix  concern for hypokalemia or AKI.  She is in no acute distress on exam.  I independently interpreted patient's labs and EKG.  EKG with atrial fibrillation but a more rapid response at this time with heart rates in the 120s.  In the room patient is fluctuating rates.  BNP has not changed significantly and is 574, troponin is normal, BMP with improvement of creatinine 1.27 and normal potassium and CBC without acute change. Pt with multiple medical problems and comorbidities and presenting today with a complaint that caries a high risk for morbidity and mortality.  Chest x-ray with concern for pulmonary congestion.  Unclear if this is related to COVID or mild pulmonary edema from elevated A-fib rates. Patient's heart rates have been intermittent here and that is at rest.  She is mildly winded at rest.  Concerned she would become hypoxic and very tachycardic when she tries to get up and walk.  Will admit for COVID, shortness of breath and uncontrolled A-fib.  Pt given an additional metoprolol  here for elevated heart rate.  Will consult the hospitalist for admission.        Final diagnoses:  COVID  Atrial fibrillation with RVR [facility])    ED Discharge Orders     None          Doretha Folks, MD 02/18/24 2310

## 2024-02-19 ENCOUNTER — Other Ambulatory Visit: Payer: Self-pay

## 2024-02-19 DIAGNOSIS — U071 COVID-19: Secondary | ICD-10-CM | POA: Diagnosis not present

## 2024-02-19 DIAGNOSIS — R531 Weakness: Secondary | ICD-10-CM

## 2024-02-19 LAB — BASIC METABOLIC PANEL WITH GFR
Anion gap: 9 (ref 5–15)
BUN: 30 mg/dL — ABNORMAL HIGH (ref 8–23)
CO2: 23 mmol/L (ref 22–32)
Calcium: 9.2 mg/dL (ref 8.9–10.3)
Chloride: 105 mmol/L (ref 98–111)
Creatinine, Ser: 1.21 mg/dL — ABNORMAL HIGH (ref 0.44–1.00)
GFR, Estimated: 41 mL/min — ABNORMAL LOW (ref >60)
Glucose, Bld: 103 mg/dL — ABNORMAL HIGH (ref 70–99)
Potassium: 4.6 mmol/L (ref 3.5–5.1)
Sodium: 137 mmol/L (ref 135–145)

## 2024-02-19 LAB — MAGNESIUM: Magnesium: 2.3 mg/dL (ref 1.7–2.4)

## 2024-02-19 LAB — CBC
HCT: 34.2 % — ABNORMAL LOW (ref 36.0–46.0)
Hemoglobin: 10.9 g/dL — ABNORMAL LOW (ref 12.0–15.0)
MCH: 31.4 pg (ref 26.0–34.0)
MCHC: 31.9 g/dL (ref 30.0–36.0)
MCV: 98.6 fL (ref 80.0–100.0)
Platelets: 128 K/uL — ABNORMAL LOW (ref 150–400)
RBC: 3.47 MIL/uL — ABNORMAL LOW (ref 3.87–5.11)
RDW: 15.2 % (ref 11.5–15.5)
WBC: 8.1 K/uL (ref 4.0–10.5)
nRBC: 0 % (ref 0.0–0.2)

## 2024-02-19 LAB — MRSA NEXT GEN BY PCR, NASAL: MRSA by PCR Next Gen: NOT DETECTED

## 2024-02-19 LAB — TROPONIN I (HIGH SENSITIVITY): Troponin I (High Sensitivity): 9 ng/L (ref <18)

## 2024-02-19 MED ORDER — CHLORHEXIDINE GLUCONATE CLOTH 2 % EX PADS
6.0000 | MEDICATED_PAD | Freq: Every day | CUTANEOUS | Status: DC
  Administered 2024-02-19: 6 via TOPICAL

## 2024-02-19 MED ORDER — FUROSEMIDE 40 MG PO TABS: 40.0000 mg | ORAL_TABLET | Freq: Every day | ORAL | Status: DC

## 2024-02-19 MED ORDER — METOPROLOL TARTRATE 5 MG/5ML IV SOLN: 2.5000 mg | Freq: Four times a day (QID) | INTRAVENOUS | Status: DC | PRN

## 2024-02-19 NOTE — ED Notes (Signed)
 Pts son larry contacted and informed of the patient going to an inpatient room. Prior to leaving the patients soon took home her hearing aides and personal wheelchair.

## 2024-02-19 NOTE — Progress Notes (Signed)
 PROGRESS NOTE    Carla Taylor  FMW:[id number] DOB: February 23, 1925 DOA: 02/18/2024 PCP: Seabron Lenis, MD    Brief Narrative:  89 year old recently diagnosed with A-fib currently on Eliquis  and metoprolol , chronic diastolic heart failure, CKD stage IIIb, hypertension hyperlipidemia and urinary retention with chronic indwelling Foley catheter, hypothyroidism who was found to be more weaker than usual and also complaining of some shortness of breath.  She went to PCP office and was diagnosed positive with COVID-19.  Brought to the emergency room because she was not feeling well.  In the emergency room on room air.  A-fib with heart rate 120-150.  Blood pressure is stable.  Creatinine 1.27.  Chest x-ray with some central vascular congestion compared to previous.  Admitted for monitoring.  Patient lives at home with support from family and caretakers.  Subjective: Patient seen and examined.  She is pleasant but confused.  Monitor shows A-fib with a heart rate of 140-150.  Patient herself denies any chest pain or palpitations.  I offered her to take her morning dose of metoprolol , she told me she will take it only after asking her family. Mostly remains asymptomatic.  Patient tells me that she is fine but not as [facility].    Assessment & Plan:   Generalized weakness related to COVID-19 infection, myalgia. Patient on room air.  No evidence of consolidation on chest x-ray.  Continue supportive care.  PT OT.  Chest physiotherapy, Mucinex . Isolation precautions for 5 days.  Persistent A-fib with RVR: Asymptomatic.  Declines taking medication until family comes in.  Therapeutic on Eliquis .  Chronic diastolic heart failure: She does have some subacute CHF symptoms with central vascular congestion and elevated BNP. Back on Lasix .  Continue Toprol -XL.  Will not suggest aggressive diuresis.  Chronic urinary retention with chronic indwelling Foley catheter: Stable.  Renal functions are stable.  Hypothyroidism:  On Synthroid  Hypertension: Stable.  On Toprol -XL and Lasix . CKD stage IIIb: At about baseline.    DVT prophylaxis:  apixaban  (ELIQUIS ) tablet 5 mg   Code Status: DNR with limited intervention Family Communication: None at the bedside Disposition Plan: Status is: Observation The patient will require care spanning > 2 midnights and should be moved to inpatient because: Monitoring and heart rate control needed     Consultants:  None  Procedures:  None  Antimicrobials:  None     Objective: Vitals:   02/19/24 0221 02/19/24 0315 02/19/24 0320 02/19/24 0801  BP:  129/88  116/69  Pulse:  (!) 109 (!) 120 (!) 102  Resp:  (!) 29 19 15   Temp: 98.3 F (36.8 C) 98.4 F (36.9 C)  98.1 F (36.7 C)  TempSrc: Oral Axillary  Oral  SpO2:  95% 94% 90%  Weight:  81.3 kg    Height:  5' 2 (1.575 m)      Intake/Output Summary (Last 24 hours) at 02/19/2024 1039 Last data filed at 02/19/2024 0807 Gross per 24 hour  Intake --  Output 1425 ml  Net -1425 ml   Filed Weights   02/19/24 0315  Weight: 81.3 kg    Examination:  General exam: Appears calm and comfortable  Pleasant.  She is oriented to herself and family.  Looks pretty comfortable. Respiratory system: Clear to auscultation. Respiratory effort normal.  Some conducted upper airway sounds.  On room air. Cardiovascular system: S1 & S2 heard, irregularly irregular. Gastrointestinal system: Soft and nontender.    Data Reviewed: I have personally reviewed following labs and imaging studies  CBC: Recent  Labs  Lab 02/18/24 2004 02/19/24 0330  WBC 8.5 8.1  NEUTROABS 6.8  --   HGB 11.1* 10.9*  HCT 35.6* 34.2*  MCV 99.7 98.6  PLT 136* 128*   Basic Metabolic Panel: Recent Labs  Lab 02/18/24 2004 02/19/24 0330  NA 134* 137  K 4.8 4.6  CL 103 105  CO2 22 23  GLUCOSE 121* 103*  BUN 31* 30*  CREATININE 1.27* 1.21*  CALCIUM  9.2 9.2  MG  --  2.3   GFR: Estimated Creatinine Clearance: 26.9 mL/min (A) (by C-G  formula based on SCr of 1.21 mg/dL (H)). Liver Function Tests: No results for input(s): AST, ALT, ALKPHOS, BILITOT, PROT, ALBUMIN in the last 168 hours. No results for input(s): LIPASE, AMYLASE in the last 168 hours. No results for input(s): AMMONIA in the last 168 hours. Coagulation Profile: No results for input(s): INR, PROTIME in the last 168 hours. Cardiac Enzymes: No results for input(s): CKTOTAL, CKMB, CKMBINDEX, TROPONINI in the last 168 hours. BNP (last 3 results) No results for input(s): PROBNP in the last 8760 hours. HbA1C: No results for input(s): HGBA1C in the last 72 hours. CBG: No results for input(s): GLUCAP in the last 168 hours. Lipid Profile: No results for input(s): CHOL, HDL, LDLCALC, TRIG, CHOLHDL, LDLDIRECT in the last 72 hours. Thyroid  Function Tests: No results for input(s): TSH, T4TOTAL, FREET4, T3FREE, THYROIDAB in the last 72 hours. Anemia Panel: No results for input(s): VITAMINB12, FOLATE, FERRITIN, TIBC, IRON, RETICCTPCT in the last 72 hours. Sepsis Labs: No results for input(s): PROCALCITON, LATICACIDVEN in the last 168 hours.  Recent Results (from the past 240 hours)  MRSA Next Gen by PCR, Nasal     Status: None   Collection Time: 02/19/24  3:25 AM   Specimen: Nasal Mucosa; Nasal Swab  Result Value Ref Range Status   MRSA by PCR Next Gen NOT DETECTED NOT DETECTED Final    Comment: (NOTE) The GeneXpert MRSA Assay (FDA approved for NASAL specimens only), is one component of a comprehensive MRSA colonization surveillance program. It is not intended to diagnose MRSA infection nor to guide or monitor treatment for MRSA infections. Test performance is not FDA approved in patients less than 25 years old. Performed at [facility], [address], [place], [place] [address]          Radiology Studies: DG Chest 2 View Result Date: 02/18/2024 CLINICAL DATA:  Evaluate for  pleural effusion EXAM: CHEST - 2 VIEW COMPARISON:  01/27/2024 FINDINGS: Cardiac shadow remains enlarged. Aortic calcifications are again seen. Increase in the degree of central vascular congestion is noted without significant edema. No sizable effusion is seen. No focal infiltrate is noted. No bony abnormality is seen. IMPRESSION: Increased central vascular congestion when compared with the prior exam. No sizable effusion is seen. Electronically Signed   By: Oneil Devonshire M.D.   On: 02/18/2024 21:02        Scheduled Meds:  apixaban   5 mg Oral BID   Chlorhexidine  Gluconate Cloth  6 each Topical Q0600   dorzolamide -timolol   1 drop Both Eyes BID   furosemide   40 mg Oral Daily   guaiFENesin   600 mg Oral BID   latanoprost   1 drop Left Eye QHS   levothyroxine   88 mcg Oral QAC breakfast   metoprolol  succinate  100 mg Oral Daily   potassium chloride  SA  20 mEq Oral Daily   sodium chloride  flush  3 mL Intravenous Q12H   Continuous Infusions:   LOS:  0 days    Time spent: 40 minutes    Renato Applebaum, MD [facility]

## 2024-02-19 NOTE — TOC Initial Note (Signed)
 Transition of Care [facility]) - Initial/Assessment Note    Patient Details  Name: Carla Taylor MRN: [id number] Date of Birth: 18-Jun-1925  [facility] CM/SW Contact:    Roxie KANDICE Stain, RN Phone Number: 02/19/2024, 12:47 PM  Clinical Narrative:                 Patient lives with son, Ubaldo,  and is active with [facility] for RN. Notified [place]  with [facility] of home health RN, PT, OT needs. Has all needed DME. Son can transport home.     Expected Discharge Plan: Home w Home Health Services Barriers to Discharge: Continued Medical Work up   Patient Goals and CMS Choice Patient states their goals for this hospitalization and ongoing recovery are:: return home CMS Medicare.gov Compare Post Acute Care list provided to:: Patient Represenative (must comment) Choice offered to / list presented to : Adult Children      Expected Discharge Plan and Services   Discharge Planning Services: CM Consult Post Acute Care Choice: Home Health Living arrangements for the past 2 months: Single Family Home                           HH Arranged: RN, PT, OT [facility] Agency: [facility] Date [facility] Agency Contacted: 02/19/24 Time HH Agency Contacted: [phone] Representative spoke with at [facility] Agency: [place]   Prior Living Arrangements/Services Living arrangements for the past 2 months: Single Family Home Lives with:: Adult Children (son) Patient language and need for interpreter reviewed:: Yes        Need for Family Participation in Patient Care: Yes (Comment) Care giver support system in place?: Yes (comment) Current home services: Home RN Criminal Activity/Legal Involvement Pertinent to Current Situation/Hospitalization: No - Comment as needed  Activities of Daily Living   ADL Screening (condition at time of admission) Independently performs ADLs?: Yes (appropriate for developmental age) Is the patient deaf or have difficulty hearing?: Yes Does the patient have difficulty  seeing, even when wearing glasses/contacts?: No Does the patient have difficulty concentrating, remembering, or making decisions?: No  Permission Sought/Granted         Permission granted to share info w AGENCY: home health        Emotional Assessment         Alcohol  / Substance Use: Not Applicable Psych Involvement: No (comment)  Admission diagnosis:  Atrial fibrillation with RVR (HCC) [I48.91] COVID [U07.1] COVID-19 virus infection [U07.1] Patient Active Problem List   Diagnosis Date Noted   Generalized weakness 02/19/2024   Persistent atrial fibrillation (HCC) 02/18/2024   Chronic heart failure with preserved ejection fraction (HFpEF, >= 50%) (HCC) 02/18/2024   Thrombocytopenia (HCC) 02/18/2024   Chronic indwelling Foley catheter 02/18/2024   Normocytic anemia 02/18/2024   COVID-19 virus infection 02/18/2024   Adjustment disorder 02/09/2024   Basal cell carcinoma 02/09/2024   Body mass index (BMI) of 36.0-36.9 in adult 02/09/2024   Class 2 obesity 02/09/2024   Hardening of the aorta (main artery of the heart) (HCC) 02/09/2024   Labial fusion 02/09/2024   Osteopenia 02/09/2024   Pure hypercholesterolemia 02/09/2024   Right shoulder pain 02/09/2024   Chronic kidney disease, stage 3b (HCC) 02/09/2024   Urinary incontinence 02/09/2024   Vitamin D  deficiency 02/09/2024   Soft tissue injury 05/20/2023   Acute respiratory failure with hypoxia (HCC) 01/21/2022   PNA (pneumonia) 01/21/2022   Nail, injury by, initial encounter 04/12/2021   Blood clotting disorder (HCC) 01/04/2021  Primary localized osteoarthritis of right knee 02/05/2016   S/P total knee arthroplasty 02/05/2016   Age-related macular degeneration, dry, both eyes 06/05/2015   Hypertensive retinopathy, bilateral 06/05/2015   Posterior vitreous detachment of both eyes 06/05/2015   Pseudophakia of both eyes 06/05/2015   Primary osteoarthritis of right shoulder 09/19/2014   Preop cardiovascular exam  05/18/2014   Edema 05/18/2014   Hypokalemia 05/07/2014   Morbid obesity (HCC) 05/06/2014   Osteoarthritis, shoulder 05/06/2014   Hypothyroidism 05/06/2014   Acute pancreatitis 05/05/2014   Essential hypertension 05/05/2014   Degenerative drusen 10/17/2011   Macular degeneration (senile) of retina 10/17/2011   PCP:  Seabron Lenis, MD Pharmacy:   [facility]) - [place], [place] - [address] [place] [address] [place] [address] Phone: [phone] Fax: [phone]  [id number] [address] [place]), [place] - [address] [place] [address] Phone: [phone] Fax: [phone]     Social Drivers of Health (SDOH) Social History: SDOH Screenings   Food Insecurity: Patient Declined (02/19/2024)  Housing: Unknown (02/19/2024)  Transportation Needs: Patient Declined (02/19/2024)  Utilities: Patient Declined (02/19/2024)  Social Connections: Patient Declined (02/19/2024)  Tobacco Use: Low Risk  (02/18/2024)   SDOH Interventions:     Readmission Risk Interventions    01/23/2022    1:30 PM  Readmission Risk Prevention Plan  Transportation Screening Complete  PCP or Specialist Appt within 5-7 Days Complete  Home Care Screening Complete  Medication Review (RN CM) Complete

## 2024-02-19 NOTE — Care Management Obs Status (Signed)
 MEDICARE OBSERVATION STATUS NOTIFICATION   Patient Details  Name: TACORI KVAMME MRN: [id number] Date of Birth: [birth year]   Medicare Observation Status Notification Given:  Yes    Moniqua Engebretsen 02/19/2024, 11:37 AM

## 2024-02-19 NOTE — Evaluation (Signed)
 Physical Therapy Evaluation Patient Details Name: Carla Taylor MRN: [id number] DOB: 11-Sep-1924 Today's Date: 02/19/2024  History of Present Illness  Pt is a 89 y/o female presenting with weakness, dyspnea in setting of COVID-19. Recent admission 7/9-7/11. PMH: HTN, HLD, CKD III, hypothyroidism, dementia, macular degeneration, chornic foley, recent a-fib with RVR.  Clinical Impression  Pt is presenting slightly below baseline level of functioning. Currently pt is Min A for bed mobility, Min A to CGA for sit to stand and CGA for gait with RW. Pt son states they are able to provide a little physical assistance as needed and discussed the need for CGA with gait/BSC for bathroom if pt going alone. Due to pt current functional status, home set up and available assistance at home recommending skilled physical therapy services 3x/week in order to address strength, balance and functional mobility to decrease risk for falls, injury and re-hospitalization.           If plan is discharge home, recommend the following: A little help with walking and/or transfers;Assistance with cooking/housework;Assist for transportation;Help with stairs or ramp for entrance     Equipment Recommendations None recommended by PT     Functional Status Assessment Patient has had a recent decline in their functional status and demonstrates the ability to make significant improvements in function in a reasonable and predictable amount of time.     Precautions / Restrictions Precautions Precautions: Fall Recall of Precautions/Restrictions: Impaired Restrictions Weight Bearing Restrictions Per Provider Order: No      Mobility  Bed Mobility Overal bed mobility: Needs Assistance Bed Mobility: Supine to Sit     Supine to sit: Min assist, HOB elevated, Used rails     General bed mobility comments: Min A to lift trunk and scoot to EOB. Pt sleeps in lift recliner at home.    Transfers Overall transfer level: Needs  assistance Equipment used: Rolling walker (2 wheels) Transfers: Sit to/from Stand Sit to Stand: Min assist, Contact guard assist           General transfer comment: Min a from EOB and CGA from recliner. Frequent cues for safe hand placement. CGA for stability from recliner    Ambulation/Gait Ambulation/Gait assistance: Contact guard assist Gait Distance (Feet): 25 Feet Assistive device: Rolling walker (2 wheels) Gait Pattern/deviations: Decreased stride length, Trunk flexed, Wide base of support, Step-to pattern, Step-through pattern Gait velocity: decreased Gait velocity interpretation: <1.31 ft/sec, indicative of household ambulator   General Gait Details: Pt was able ambulate with RW short distance with CGA and cues as pt tends to flex and for reciprocal gait pattern which pt is able to perform.     Balance Overall balance assessment: Needs assistance Sitting-balance support: No upper extremity supported, Feet supported Sitting balance-Leahy Scale: Fair     Standing balance support: Bilateral upper extremity supported, Reliant on assistive device for balance, During functional activity Standing balance-Leahy Scale: Poor Standing balance comment: CGA for external support. No overt LOB       Pertinent Vitals/Pain Pain Assessment Pain Assessment: No/denies pain    Home Living Family/patient expects to be discharged to:: Private residence Living Arrangements: Children Available Help at Discharge: Family;Available PRN/intermittently Type of Home: House Home Access: Ramped entrance       Home Layout: One level Home Equipment: Cane - single point;Wheelchair - Event organiser (2 wheels);Rollator (4 wheels);BSC/3in1;Shower seat - built in;Grab bars - toilet;Grab bars - tub/shower;Hand held shower head      Prior Function Prior Level of Function :  Independent/Modified Independent;History of Falls (last six months)             Mobility Comments:  uses RW in house, rollator out doors and wheelchair outdoors due to SOB ADLs Comments: B/D self, supervision for showering for safety but able to physically manage the task. cooking limited on her own, knits, does her own laundry     Extremity/Trunk Assessment   Upper Extremity Assessment Upper Extremity Assessment: Defer to OT evaluation    Lower Extremity Assessment Lower Extremity Assessment: Generalized weakness    Cervical / Trunk Assessment Cervical / Trunk Assessment: Kyphotic  Communication   Communication Communication: Impaired Factors Affecting Communication: Hearing impaired    Cognition Arousal: Alert Behavior During Therapy: WFL for tasks assessed/performed   PT - Cognitive impairments: History of cognitive impairments, No apparent impairments       Following commands: Intact       Cueing Cueing Techniques: Verbal cues     General Comments General comments (skin integrity, edema, etc.): O2 sats remained 94-98% on 1L O2  via [place] during activity. Discussed BSC and CGA at home for patient with son in order to decrease risk for falls.        Assessment/Plan    PT Assessment Patient needs continued PT services  PT Problem List Decreased activity tolerance;Decreased balance;Decreased mobility;Decreased knowledge of use of DME;Decreased safety awareness;Decreased knowledge of precautions;Cardiopulmonary status limiting activity;Decreased strength       PT Treatment Interventions DME instruction;Gait training;Functional mobility training;Therapeutic activities;Therapeutic exercise;Balance training;Patient/family education    PT Goals (Current goals can be found in the Care Plan section)  Acute Rehab PT Goals Patient Stated Goal: to go home and improve gait PT Goal Formulation: With patient/family Time For Goal Achievement: 03/04/24 Potential to Achieve Goals: Good    Frequency Min 2X/week        AM-PAC PT 6 Clicks Mobility  Outcome Measure Help  needed turning from your back to your side while in a flat bed without using bedrails?: A Little Help needed moving from lying on your back to sitting on the side of a flat bed without using bedrails?: A Little Help needed moving to and from a bed to a chair (including a wheelchair)?: A Little Help needed standing up from a chair using your arms (e.g., wheelchair or bedside chair)?: A Little Help needed to walk in hospital room?: A Little Help needed climbing 3-5 steps with a railing? : A Lot 6 Click Score: 17    End of Session Equipment Utilized During Treatment: Gait belt Activity Tolerance: Patient tolerated treatment well Patient left: in chair;with call bell/phone within reach;with family/visitor present Nurse Communication: Mobility status (son in room, no alarm pad under patient. Son will be there all day. Aware of fall risk) PT Visit Diagnosis: Muscle weakness (generalized) (M62.81);Other abnormalities of gait and mobility (R26.89);Unsteadiness on feet (R26.81)    Time: 8980-8946 PT Time Calculation (min) (ACUTE ONLY): 34 min   Charges:   PT Evaluation $PT Eval Low Complexity: 1 Low PT Treatments $Therapeutic Activity: 8-22 mins PT General Charges $$ ACUTE PT VISIT: 1 Visit        Carla Taylor, DPT, CLT  Acute Rehabilitation Services Office: [phone] (Secure chat preferred)   Carla Taylor 02/19/2024, 11:28 AM

## 2024-02-19 NOTE — Evaluation (Signed)
 Occupational Therapy Evaluation Patient Details Name: Carla Taylor MRN: [id number] DOB: Feb 24, 1925 Today's Date: 02/19/2024   History of Present Illness   Pt is a 89 y/o female presenting with weakness, dyspnea in setting of COVID-19. Recent admission 7/9-7/11. PMH: HTN, HLD, CKD III, hypothyroidism, dementia, macular degeneration, chornic foley, recent a-fib with RVR.     Clinical Impressions PTA, pt lives with son, typically Modified Independent with ADLs, household IADLs and household mobility using a walker. Pt presents with deficits in standing balance, strength and overall endurance. Pt typically sleeps in a lift chair at home so required Min A for bed mobility and Min-Mod A to stand from bedside. Once up, pt able to take steps more easily though deferred transfer OOB to recliner at this time. Pt requires Setup for UB ADL and Min A for LB ADLs. Anticipate pt to continue progressing acutely to return home with HHOT. Pt also reports that family can provide assist as needed.  SpO2 96% on 2 L O2, 91% on RA     If plan is discharge home, recommend the following:   A little help with walking and/or transfers;A little help with bathing/dressing/bathroom;Assistance with cooking/housework     Functional Status Assessment   Patient has had a recent decline in their functional status and demonstrates the ability to make significant improvements in function in a reasonable and predictable amount of time.     Equipment Recommendations   None recommended by OT     Recommendations for Other Services         Precautions/Restrictions   Precautions Precautions: Fall Restrictions Weight Bearing Restrictions Per Provider Order: No     Mobility Bed Mobility Overal bed mobility: Needs Assistance Bed Mobility: Supine to Sit, Sit to Supine     Supine to sit: Min assist, HOB elevated, Used rails Sit to supine: Min assist   General bed mobility comments: Min A to lift trunk and  scoot to EOB. Min A for LE back to bed    Transfers Overall transfer level: Needs assistance Equipment used: Rolling walker (2 wheels) Transfers: Sit to/from Stand Sit to Stand: Min assist, Mod assist           General transfer comment: Mod A for initial stand progressing to Min A on second attempt. Once up, able to take side steps at EOB with CGA-Min A. Pt declined transfer to recliner at time of eval      Balance Overall balance assessment: Needs assistance Sitting-balance support: No upper extremity supported, Feet supported Sitting balance-Leahy Scale: Fair     Standing balance support: Bilateral upper extremity supported Standing balance-Leahy Scale: Poor                             ADL either performed or assessed with clinical judgement   ADL Overall ADL's : Needs assistance/impaired Eating/Feeding: Independent   Grooming: Set up;Sitting   Upper Body Bathing: Set up;Sitting   Lower Body Bathing: Minimal assistance;Sit to/from stand;Sitting/lateral leans   Upper Body Dressing : Set up;Sitting   Lower Body Dressing: Minimal assistance;Sitting/lateral leans;Sit to/from stand   Toilet Transfer: Minimal assistance;Stand-pivot;BSC/3in1;Rolling walker (2 wheels)   Toileting- Clothing Manipulation and Hygiene: Minimal assistance;Sitting/lateral lean;Sit to/from stand       Functional mobility during ADLs: Contact guard assist;Rolling walker (2 wheels)       Vision Baseline Vision/History: 1 Wears glasses Ability to See in Adequate Light: 0 Adequate Patient Visual Report: No change  from baseline Vision Assessment?: No apparent visual deficits     Perception         Praxis         Pertinent Vitals/Pain Pain Assessment Pain Assessment: No/denies pain     Extremity/Trunk Assessment Upper Extremity Assessment Upper Extremity Assessment: Defer to OT evaluation   Lower Extremity Assessment Lower Extremity Assessment: Generalized weakness    Cervical / Trunk Assessment Cervical / Trunk Assessment: Kyphotic   Communication Communication Communication: Impaired Factors Affecting Communication: Hearing impaired   Cognition Arousal: Alert Behavior During Therapy: WFL for tasks assessed/performed Cognition: Difficult to assess Difficult to assess due to: Hard of hearing/deaf           OT - Cognition Comments: pt reports some confusion but appropriate in PLOF report, home setup and understanding of situation. repetition and increased volume needed                 Following commands: Intact       Cueing  General Comments      O2 sats remained 94-98% on 1L O2  via [place] during activity. Discussed BSC and CGA at home for patient with son in order to decrease risk for falls.   Exercises     Shoulder Instructions      Home Living Family/patient expects to be discharged to:: Private residence Living Arrangements: Children Available Help at Discharge: Family;Available PRN/intermittently Type of Home: House Home Access: Ramped entrance     Home Layout: One level     Bathroom Shower/Tub: Producer, television/film/video: Handicapped height     Home Equipment: Cane - single point;Wheelchair - Event organiser (2 wheels);Rollator (4 wheels);BSC/3in1;Shower seat - built in;Grab bars - toilet;Grab bars - tub/shower;Hand held shower head          Prior Functioning/Environment Prior Level of Function : Independent/Modified Independent;History of Falls (last six months)             Mobility Comments: uses RW in house, rollator out doors and wheelchair outdoors due to SOB ADLs Comments: B/D self, supervision for showering for safety but able to physically manage the task. cooking limited on her own, knits, does her own laundry    OT Problem List: Decreased strength;Impaired balance (sitting and/or standing);Decreased activity tolerance;Cardiopulmonary status limiting activity   OT  Treatment/Interventions: Therapeutic exercise;Energy conservation;DME and/or AE instruction;Therapeutic activities;Patient/family education;Balance training      OT Goals(Current goals can be found in the care plan section)   Acute Rehab OT Goals Patient Stated Goal: be able to go home soon OT Goal Formulation: With patient Time For Goal Achievement: 03/04/24 Potential to Achieve Goals: Good ADL Goals Pt Will Perform Grooming: with modified independence;standing Pt Will Perform Lower Body Bathing: with modified independence;sit to/from stand;sitting/lateral leans Pt Will Transfer to Toilet: with modified independence;ambulating Additional ADL Goal #1: Pt to verbalize at least 3 energy conservation strategies to implement during ADLs, IADLs and mobility   OT Frequency:  Min 2X/week    Co-evaluation              AM-PAC OT 6 Clicks Daily Activity     Outcome Measure Help from another person eating meals?: None Help from another person taking care of personal grooming?: A Little Help from another person toileting, which includes using toliet, bedpan, or urinal?: A Little Help from another person bathing (including washing, rinsing, drying)?: A Little Help from another person to put on and taking off regular upper body clothing?: A Little Help  from another person to put on and taking off regular lower body clothing?: A Lot 6 Click Score: 18   End of Session Equipment Utilized During Treatment: Rolling walker (2 wheels);Oxygen Nurse Communication: Mobility status  Activity Tolerance: Patient tolerated treatment well Patient left: in bed;with call bell/phone within reach;with bed alarm set  OT Visit Diagnosis: Other abnormalities of gait and mobility (R26.89);Unsteadiness on feet (R26.81);Muscle weakness (generalized) (M62.81)                Time: 9076-9050 OT Time Calculation (min): 26 min Charges:  OT General Charges $OT Visit: 1 Visit OT Evaluation $OT Eval Moderate  Complexity: 1 Mod OT Treatments $Therapeutic Activity: 8-22 mins  Mliss NOVAK, OTR/L Acute Rehab Services Office: [phone]   Mliss Fish 02/19/2024, 11:30 AM

## 2024-02-19 NOTE — TOC CM/SW Note (Signed)
 Left VM with Ubaldo, son, requesting return call.

## 2024-02-19 NOTE — Plan of Care (Signed)
  Problem: Education: Goal: Knowledge of General Education information will improve Description: Including pain rating scale, medication(s)/side effects and non-pharmacologic comfort measures Outcome: Progressing   Problem: Clinical Measurements: Goal: Respiratory complications will improve Outcome: Progressing   Problem: Skin Integrity: Goal: Risk for impaired skin integrity will decrease Outcome: Progressing   

## 2024-02-19 NOTE — Discharge Summary (Signed)
 Physician Discharge Summary  Carla Taylor FMW:[id number] DOB: 20-Sep-1924 DOA: 02/18/2024  PCP: Carla Lenis, MD  Admit date: 02/18/2024 Discharge date: 02/19/2024  Admitted From: Home  Disposition:  Home with home health care   Recommendations for Outpatient Follow-up:  Follow up with PCP in 1-2 weeks Please obtain BMP/CBC in one week   Home Health:PT/OT/RN  Equipment/Devices: available at home    Discharge Condition:Stable   CODE STATUS:DNR - Limited intervention  Diet recommendation: regular diet , nutritional supplements   Discharge Summary: 89 year old recently diagnosed with A-fib currently on Eliquis  and metoprolol , chronic diastolic heart failure, CKD stage IIIb, hypertension hyperlipidemia and urinary retention with chronic indwelling Foley catheter, hypothyroidism who was found to be more weaker than usual and also complaining of some shortness of breath.  She went to PCP office and was diagnosed positive with COVID-19.  Brought to the emergency room because she was not feeling well.  In the emergency room on room air.  A-fib with heart rate 120-150.  Blood pressure is stable.  Creatinine 1.27.  Chest x-ray with some central vascular congestion compared to previous.  Admitted for monitoring.  Patient lives at home with support from family and caretakers.   Generalized weakness related to COVID-19 infection, myalgia. Patient on room air.  No evidence of consolidation on chest x-ray.  Continue supportive care.  PT OT.  Chest physiotherapy, Mucinex . Isolation precautions for 5 days.   Persistent A-fib with RVR: Asymptomatic. Resume metoprolol . Therapeutic on Eliquis .   Chronic diastolic heart failure: She does have some subacute CHF symptoms with central vascular congestion and elevated BNP. Back on Lasix .  Continue Toprol -XL.  Will not suggest aggressive diuresis. Suggested to go back on daily Lasix  along with potassium supplements.    Chronic urinary retention with chronic  indwelling Foley catheter: Stable.  Renal functions are stable.   Hypothyroidism: On Synthroid  Hypertension: Stable.  On Toprol -XL and Lasix . CKD stage IIIb: At about baseline.  Medically stable to go home.  She has good support system at home.  She will also benefit with home health PT OT.  Discharge Diagnoses:  Principal Problem:   COVID-19 virus infection Active Problems:   Persistent atrial fibrillation (HCC)   Chronic heart failure with preserved ejection fraction (HFpEF, >= 50%) (HCC)   Chronic kidney disease, stage 3b (HCC)   Essential hypertension   Hypothyroidism   Thrombocytopenia (HCC)   Chronic indwelling Foley catheter   Normocytic anemia   Generalized weakness    Discharge Instructions  Discharge Instructions     Diet general   Complete by: As directed    Increase activity slowly   Complete by: As directed       Allergies as of 02/19/2024   No Known Allergies      Medication List     TAKE these medications    acetaminophen  650 MG CR tablet Commonly known as: TYLENOL  Take 1,300 mg by mouth every 6 (six) hours as needed for pain.   apixaban  5 MG Tabs tablet Commonly known as: ELIQUIS  Take 1 tablet (5 mg total) by mouth 2 (two) times daily.   Besivance 0.6 % Susp Generic drug: Besifloxacin HCl Place 4 drops into both eyes See admin instructions. 4 drops before shot & 4 drops after shot   CALCIUM  600 + D PO Take 600 mg by mouth in the morning and at bedtime.   dorzolamide -timolol  2-0.5 % ophthalmic solution Commonly known as: COSOPT  Place 1 drop into both eyes 2 (two) times daily.  doxazosin  2 MG tablet Commonly known as: CARDURA  TAKE 1 TABLET BY MOUTH  DAILY  PLEASE OBTAIN FUTURE  REFILLS FROM PCP. What changed: See the new instructions.   furosemide  40 MG tablet Commonly known as: LASIX  Take 1 tablet (40 mg total) by mouth daily. What changed: when to take this   ICAPS AREDS FORMULA PO Take 1 tablet by mouth 2 (two) times daily.    latanoprost  0.005 % ophthalmic solution Commonly known as: XALATAN  Place 1 drop into the left eye at bedtime.   levothyroxine  88 MCG tablet Commonly known as: SYNTHROID  Take 88 mcg by mouth daily before breakfast.   metoprolol  succinate 100 MG 24 hr tablet Commonly known as: TOPROL -XL Take 1 tablet (100 mg total) by mouth daily. Take with or immediately following a meal.   potassium chloride  SA 20 MEQ tablet Commonly known as: KLOR-CON  M Take 1 tablet (20 mEq total) by mouth daily.   sennosides-docusate sodium  8.6-50 MG tablet Commonly known as: SENOKOT-S Take 1 tablet by mouth daily as needed for constipation.   SYSTANE OP Place 1 drop into both eyes as needed (dryness/itching).   vitamin B-12 500 MCG tablet Commonly known as: CYANOCOBALAMIN  Take 500 mcg by mouth daily.   Vitamin D  (Ergocalciferol ) 1.25 MG (50000 UNIT) Caps capsule Commonly known as: DRISDOL  Take 50,000 Units by mouth every Sunday.        Follow-up Information     [facility].   Specialty: Home Health Services Why: Home health has been arranged They will contact you to schedule apt within 48hrs post discharge Contact information: [address] [place] [address] [phone]                No Known Allergies  Consultations: None   Procedures/Studies: DG Chest 2 View Result Date: 02/18/2024 CLINICAL DATA:  Evaluate for pleural effusion EXAM: CHEST - 2 VIEW COMPARISON:  01/27/2024 FINDINGS: Cardiac shadow remains enlarged. Aortic calcifications are again seen. Increase in the degree of central vascular congestion is noted without significant edema. No sizable effusion is seen. No focal infiltrate is noted. No bony abnormality is seen. IMPRESSION: Increased central vascular congestion when compared with the prior exam. No sizable effusion is seen. Electronically Signed   By: Carla  Taylor M.D.   On: 02/18/2024 21:02   ECHOCARDIOGRAM COMPLETE Result Date:  01/28/2024    ECHOCARDIOGRAM REPORT   [name] Date of Exam: 01/28/2024 Medical Rec #:  [id number]    Height:       62.0 in Accession #:    [id number]   Weight:       177.0 lb Date of Birth:  03/06/1925    BSA:          1.815 m Patient Age:    89 years     BP:           131/73 mmHg Patient Gender: F            HR:           10 1 bpm. Exam Location:  Inpatient Procedure: 2D Echo, Color Doppler and Cardiac Doppler (Both Spectral and Color            Flow Doppler were utilized during procedure). Indications:    Dyspnea R06.00  History:        Patient has prior history of Echocardiogram examinations, most  recent 01/22/2022. Arrythmias:Atrial Fibrillation; Risk                 Factors:Dyslipidemia and Hypertension.  Sonographer:    Koleen Popper RDCS Referring Phys: [id number] BELKYS A REGALADO IMPRESSIONS  1. Left ventricular ejection fraction, by estimation, is 60 to 65%. The left ventricle has normal function. The left ventricle has no regional wall motion abnormalities. There is moderate left ventricular hypertrophy. Left ventricular diastolic function  could not be evaluated.  2. Right ventricular systolic function is normal. The right ventricular size is normal. There is moderately elevated pulmonary artery systolic pressure.  3. Left atrial size was moderately dilated.  4. Right atrial size was moderately dilated.  5. There is severe mitral annular calcification with restricted movement of the posterior leaflet with moderate MR. The mitral valve is abnormal. Moderate mitral valve regurgitation. No evidence of mitral stenosis. Severe mitral annular calcification.  6. The aortic valve is tricuspid. There is moderate calcification of the aortic valve. Aortic valve regurgitation is trivial. Aortic valve sclerosis/calcification is present, without any evidence of aortic stenosis.  7. The inferior vena cava is dilated in size with >50% respiratory variability, suggesting right atrial pressure  of 8 mmHg. FINDINGS  Left Ventricle: Left ventricular ejection fraction, by estimation, is 60 to 65%. The left ventricle has normal function. The left ventricle has no regional wall motion abnormalities. The left ventricular internal cavity size was normal in size. There is  moderate left ventricular hypertrophy. Left ventricular diastolic function could not be evaluated due to atrial fibrillation. Left ventricular diastolic function could not be evaluated. Right Ventricle: The right ventricular size is normal. No increase in right ventricular wall thickness. Right ventricular systolic function is normal. There is moderately elevated pulmonary artery systolic pressure. The tricuspid regurgitant velocity is 3.27 m/s, and with an assumed right atrial pressure of 8 mmHg, the estimated right ventricular systolic pressure is 50.8 mmHg. Left Atrium: Left atrial size was moderately dilated. Right Atrium: Right atrial size was moderately dilated. Pericardium: There is no evidence of pericardial effusion. Mitral Valve: There is severe mitral annular calcification with restricted movement of the posterior leaflet with moderate MR. The mitral valve is abnormal. There is mild calcification of the mitral valve leaflet(s). Severe mitral annular calcification. Moderate mitral valve regurgitation. No evidence of mitral valve stenosis. MV peak gradient, 12.8 mmHg. The mean mitral valve gradient is 3.0 mmHg. Tricuspid Valve: The tricuspid valve is normal in structure. Tricuspid valve regurgitation is mild . No evidence of tricuspid stenosis. Aortic Valve: The aortic valve is tricuspid. There is moderate calcification of the aortic valve. Aortic valve regurgitation is trivial. Aortic valve sclerosis/calcification is present, without any evidence of aortic stenosis. Pulmonic Valve: The pulmonic valve was normal in structure. Pulmonic valve regurgitation is trivial. No evidence of pulmonic stenosis. Aorta: The aortic root is normal in  size and structure. Venous: The inferior vena cava is dilated in size with greater than 50% respiratory variability, suggesting right atrial pressure of 8 mmHg. IAS/Shunts: No atrial level shunt detected by color flow Doppler.  LEFT VENTRICLE PLAX 2D LVIDd:         3.30 cm LVIDs:         2.30 cm LV PW:         1.70 cm LV IVS:        1.10 cm LVOT diam:     2.00 cm LV SV:         51 LV SV Index:   28  LVOT Area:     3.14 cm  RIGHT VENTRICLE            IVC RV Basal diam:  3.90 cm    IVC diam: 2.10 cm RV S prime:     9.46 cm/s TAPSE (M-mode): 1.2 cm LEFT ATRIUM             Index        RIGHT ATRIUM           Index LA diam:        3.30 cm 1.82 cm/m   RA Area:     22.60 cm LA Vol (A2C):   60.5 ml 33.33 ml/m  RA Volume:   74.70 ml  41.16 ml/m LA Vol (A4C):   47.9 ml 26.39 ml/m LA Biplane Vol: 53.8 ml 29.64 ml/m  AORTIC VALVE LVOT Vmax:   79.50 cm/s LVOT Vmean:  53.000 cm/s LVOT VTI:    0.162 m  AORTA Ao Root diam: 3.20 cm Ao Asc diam:  3.20 cm MITRAL VALVE             TRICUSPID VALVE MV Area VTI:  1.91 cm   TR Peak grad:   42.8 mmHg MV Peak grad: 12.8 mmHg  TR Vmax:        327.00 cm/s MV Mean grad: 3.0 mmHg MV Vmax:      1.79 m/s   SHUNTS MV Vmean:     73.4 cm/s  Systemic VTI:  0.16 m                          Systemic Diam: 2.00 cm Toribio Fuel MD Electronically signed by Toribio Fuel MD Signature Date/Time: 01/28/2024/9:46:09 AM    Final    CT HEAD WO CONTRAST ( ) Result Date: 01/27/2024 CLINICAL DATA:  Altered mental status, nontraumatic (Ped 0-17y) EXAM: CT HEAD WITHOUT CONTRAST TECHNIQUE: Contiguous axial images were obtained from the base of the skull through the vertex without intravenous contrast. RADIATION DOSE REDUCTION: This exam was performed according to the departmental dose-optimization program which includes automated exposure control, adjustment of the mA and/or kV according to patient size and/or use of iterative reconstruction technique. COMPARISON:  CT of the head dated May 05, 2014. FINDINGS: Brain: Age-related atrophy. Mild to moderate periventricular white matter disease. No evidence of hemorrhage, mass, acute cortical infarct or hydrocephalus. Vascular: Moderate vascular calcifications. Skull: Intact.  No osseous lesions are present. Sinuses/Orbits: The visualized paranasal sinuses are clear. The patient is status post bilateral lens replacement and scleral banding. Other: None. IMPRESSION: 1. Mild-to-moderate periventricular white matter disease. No apparent acute process. Electronically Signed   By: Evalene Coho M.D.   On: 01/27/2024 17:15   DG Chest 2 View Result Date: 01/27/2024 CLINICAL DATA:  Shortness of breath. Atrial fibrillation. 01/20/2022 EXAM: CHEST - 2 VIEW COMPARISON:  01/20/2022 FINDINGS: The cardiac silhouette is borderline enlarged with a mild increase in size. Increased prominence of the pulmonary vasculature. No significant change in mild chronic interstitial prominence and possible tiny bilateral pleural effusions. Stable right shoulder prosthesis. Left shoulder degenerative changes. Thoracic spine degenerative changes. IMPRESSION: 1. Interval borderline cardiomegaly and pulmonary vascular congestion compatible with mild CHF. 2. No significant change in mild chronic interstitial lung disease and possible tiny bilateral pleural effusions. Electronically Signed   By: Elspeth Bathe M.D.   On: 01/27/2024 14:00   (Echo, Carotid, EGD, Colonoscopy, ERCP)    Subjective: Patient seen and examined in the morning rounds.  Called and discussed with patient's son.  Patient herself denies any complaints.  Denies any chest pain or shortness of breath.  Her heart rate was 130 before taking metoprolol .   Discharge Exam: Vitals:   02/19/24 1125 02/19/24 1155  BP:  116/69  Pulse:  98  Resp:  16  Temp:  97.9 F (36.6 C)  SpO2: 93% 94%   Vitals:   02/19/24 0320 02/19/24 0801 02/19/24 1125 02/19/24 1155  BP:  116/69  116/69  Pulse: (!) 120 (!) 102  98  Resp:  19 15  16   Temp:  98.1 F (36.7 C)  97.9 F (36.6 C)  TempSrc:  Oral  Oral  SpO2: 94% 90% 93% 94%  Weight:      Height:        General: Pt is alert, awake, not in acute distress Frail.  Age-appropriate. Cardiovascular: Irregularly irregular, S1/S2 +, no rubs, no gallops Respiratory: CTA bilaterally, no wheezing, no rhonchi.  On room air. Abdominal: Soft, NT, ND, bowel sounds +, Foley catheter with clear urine. Extremities: no edema, no cyanosis    The results of significant diagnostics from this hospitalization (including imaging, microbiology, ancillary and laboratory) are listed below for reference.     Microbiology: Recent Results (from the past 240 hours)  MRSA Next Gen by PCR, Nasal     Status: None   Collection Time: 02/19/24  3:25 AM   Specimen: Nasal Mucosa; Nasal Swab  Result Value Ref Range Status   MRSA by PCR Next Gen NOT DETECTED NOT DETECTED Final    Comment: (NOTE) The GeneXpert MRSA Assay (FDA approved for NASAL specimens only), is one component of a comprehensive MRSA colonization surveillance program. It is not intended to diagnose MRSA infection nor to guide or monitor treatment for MRSA infections. Test performance is not FDA approved in patients less than 44 years old. Performed at [facility], [address], [place], [place] [address]      Labs: BNP (last 3 results) Recent Labs    01/27/24 1400 02/18/24 2004  BNP 606.3* 574.7*   Basic Metabolic Panel: Recent Labs  Lab 02/18/24 2004 02/19/24 0330  NA 134* 137  K 4.8 4.6  CL 103 105  CO2 22 23  GLUCOSE 121* 103*  BUN 31* 30*  CREATININE 1.27* 1.21*  CALCIUM  9.2 9.2  MG  --  2.3   Liver Function Tests: No results for input(s): AST, ALT, ALKPHOS, BILITOT, PROT, ALBUMIN in the last 168 hours. No results for input(s): LIPASE, AMYLASE in the last 168 hours. No results for input(s): AMMONIA in the last 168 hours. CBC: Recent Labs  Lab 02/18/24 2004  02/19/24 0330  WBC 8.5 8.1  NEUTROABS 6.8  --   HGB 11.1* 10.9*  HCT 35.6* 34.2*  MCV 99.7 98.6  PLT 136* 128*   Cardiac Enzymes: No results for input(s): CKTOTAL, CKMB, CKMBINDEX, TROPONINI in the last 168 hours. BNP: Invalid input(s): POCBNP CBG: No results for input(s): GLUCAP in the last 168 hours. D-Dimer No results for input(s): DDIMER in the last 72 hours. Hgb A1c No results for input(s): HGBA1C in the last 72 hours. Lipid Profile No results for input(s): CHOL, HDL, LDLCALC, TRIG, CHOLHDL, LDLDIRECT in the last 72 hours. Thyroid  function studies No results for input(s): TSH, T4TOTAL, T3FREE, THYROIDAB in the last 72 hours.  Invalid input(s): FREET3 Anemia work up No results for input(s): VITAMINB12, FOLATE, FERRITIN, TIBC, IRON, RETICCTPCT in the last 72 hours. Urinalysis    Component  Value Date/Time   COLORURINE YELLOW 01/27/2024 1631   APPEARANCEUR CLOUDY (A) 01/27/2024 1631   LABSPEC 1.008 01/27/2024 1631   PHURINE 8.0 01/27/2024 1631   GLUCOSEU NEGATIVE 01/27/2024 1631   HGBUR MODERATE (A) 01/27/2024 1631   BILIRUBINUR NEGATIVE 01/27/2024 1631   KETONESUR NEGATIVE 01/27/2024 1631   PROTEINUR 30 (A) 01/27/2024 1631   UROBILINOGEN 0.2 04/28/2014 1733   NITRITE NEGATIVE 01/27/2024 1631   LEUKOCYTESUR LARGE (A) 01/27/2024 1631   Sepsis Labs Recent Labs  Lab 02/18/24 2004 02/19/24 0330  WBC 8.5 8.1   Microbiology Recent Results (from the past 240 hours)  MRSA Next Gen by PCR, Nasal     Status: None   Collection Time: 02/19/24  3:25 AM   Specimen: Nasal Mucosa; Nasal Swab  Result Value Ref Range Status   MRSA by PCR Next Gen NOT DETECTED NOT DETECTED Final    Comment: (NOTE) The GeneXpert MRSA Assay (FDA approved for NASAL specimens only), is one component of a comprehensive MRSA colonization surveillance program. It is not intended to diagnose MRSA infection nor to guide or monitor treatment for MRSA  infections. Test performance is not FDA approved in patients less than 26 years old. Performed at [facility], [address] [place], [place] [address]      Time coordinating discharge: 35 minutes  SIGNED:   Renato Applebaum, MD  [facility] 02/19/2024, 1:50 PM

## 2024-02-19 NOTE — Progress Notes (Signed)
 Skin exam on admit: One small area of abrasion on her right buttocks, about four inches from medial, not over a bony prominence; appears scabbed and healing.  Patient medial buttocks area is red but blanchable. Mepilex applied for greater protection.

## 2024-02-19 NOTE — TOC Transition Note (Signed)
 Transition of Care [facility]) - Discharge Note   Patient Details  Name: Carla Taylor MRN: [id number] Date of Birth: 06-May-1925  Transition of [facility]) CM/SW Contact:  Roxie KANDICE Stain, RN Phone Number: 02/19/2024, 2:13 PM   Clinical Narrative:    Patient stable for discharge. Son will transport home. [place]  with [facility] aware of dischrage.     Final next level of care: Home w Home Health Services Barriers to Discharge: Barriers Resolved   Patient Goals and CMS Choice Patient states their goals for this hospitalization and ongoing recovery are:: return home CMS Medicare.gov Compare Post Acute Care list provided to:: Patient Represenative (must comment) Choice offered to / list presented to : Adult Children      Discharge Placement  home                     Discharge Plan and Services Additional resources added to the After Visit Summary for     Discharge Planning Services: CM Consult Post Acute Care Choice: Home Health                    HH Arranged: RN, PT, OT [facility] Agency: [facility] Date [facility] Agency Contacted: 02/19/24 Time HH Agency Contacted: 1247 Representative spoke with at [facility] Agency: [place]   Social Drivers of Health (SDOH) Interventions SDOH Screenings   Food Insecurity: Patient Declined (02/19/2024)  Housing: Unknown (02/19/2024)  Transportation Needs: Patient Declined (02/19/2024)  Utilities: Patient Declined (02/19/2024)  Social Connections: Patient Declined (02/19/2024)  Tobacco Use: Low Risk  (02/18/2024)     Readmission Risk Interventions    01/23/2022    1:30 PM  Readmission Risk Prevention Plan  Transportation Screening Complete  PCP or Specialist Appt within 5-7 Days Complete  Home Care Screening Complete  Medication Review (RN CM) Complete

## 2024-02-25 DIAGNOSIS — D631 Anemia in chronic kidney disease: Secondary | ICD-10-CM | POA: Diagnosis not present

## 2024-02-25 DIAGNOSIS — I130 Hypertensive heart and chronic kidney disease with heart failure and stage 1 through stage 4 chronic kidney disease, or unspecified chronic kidney disease: Secondary | ICD-10-CM | POA: Diagnosis not present

## 2024-02-25 DIAGNOSIS — N1832 Chronic kidney disease, stage 3b: Secondary | ICD-10-CM | POA: Diagnosis not present

## 2024-02-25 DIAGNOSIS — M1990 Unspecified osteoarthritis, unspecified site: Secondary | ICD-10-CM | POA: Diagnosis not present

## 2024-02-25 DIAGNOSIS — I5032 Chronic diastolic (congestive) heart failure: Secondary | ICD-10-CM | POA: Diagnosis not present

## 2024-02-25 DIAGNOSIS — Z466 Encounter for fitting and adjustment of urinary device: Secondary | ICD-10-CM | POA: Diagnosis not present

## 2024-02-29 DIAGNOSIS — Z87440 Personal history of urinary (tract) infections: Secondary | ICD-10-CM | POA: Diagnosis not present

## 2024-02-29 DIAGNOSIS — M1990 Unspecified osteoarthritis, unspecified site: Secondary | ICD-10-CM | POA: Diagnosis not present

## 2024-02-29 DIAGNOSIS — H409 Unspecified glaucoma: Secondary | ICD-10-CM | POA: Diagnosis not present

## 2024-02-29 DIAGNOSIS — D631 Anemia in chronic kidney disease: Secondary | ICD-10-CM | POA: Diagnosis not present

## 2024-02-29 DIAGNOSIS — Z466 Encounter for fitting and adjustment of urinary device: Secondary | ICD-10-CM | POA: Diagnosis not present

## 2024-02-29 DIAGNOSIS — Z8701 Personal history of pneumonia (recurrent): Secondary | ICD-10-CM | POA: Diagnosis not present

## 2024-02-29 DIAGNOSIS — N1832 Chronic kidney disease, stage 3b: Secondary | ICD-10-CM | POA: Diagnosis not present

## 2024-02-29 DIAGNOSIS — I130 Hypertensive heart and chronic kidney disease with heart failure and stage 1 through stage 4 chronic kidney disease, or unspecified chronic kidney disease: Secondary | ICD-10-CM | POA: Diagnosis not present

## 2024-02-29 DIAGNOSIS — I5032 Chronic diastolic (congestive) heart failure: Secondary | ICD-10-CM | POA: Diagnosis not present

## 2024-02-29 DIAGNOSIS — K5900 Constipation, unspecified: Secondary | ICD-10-CM | POA: Diagnosis not present

## 2024-02-29 DIAGNOSIS — E039 Hypothyroidism, unspecified: Secondary | ICD-10-CM | POA: Diagnosis not present

## 2024-02-29 DIAGNOSIS — E785 Hyperlipidemia, unspecified: Secondary | ICD-10-CM | POA: Diagnosis not present

## 2024-03-02 ENCOUNTER — Encounter (INDEPENDENT_AMBULATORY_CARE_PROVIDER_SITE_OTHER): Admitting: Ophthalmology

## 2024-03-02 DIAGNOSIS — H43813 Vitreous degeneration, bilateral: Secondary | ICD-10-CM

## 2024-03-02 DIAGNOSIS — H35033 Hypertensive retinopathy, bilateral: Secondary | ICD-10-CM | POA: Diagnosis not present

## 2024-03-02 DIAGNOSIS — I10 Essential (primary) hypertension: Secondary | ICD-10-CM | POA: Diagnosis not present

## 2024-03-02 DIAGNOSIS — H353231 Exudative age-related macular degeneration, bilateral, with active choroidal neovascularization: Secondary | ICD-10-CM | POA: Diagnosis not present

## 2024-03-03 DIAGNOSIS — Z466 Encounter for fitting and adjustment of urinary device: Secondary | ICD-10-CM | POA: Diagnosis not present

## 2024-03-03 DIAGNOSIS — M1611 Unilateral primary osteoarthritis, right hip: Secondary | ICD-10-CM | POA: Diagnosis not present

## 2024-03-03 DIAGNOSIS — M1990 Unspecified osteoarthritis, unspecified site: Secondary | ICD-10-CM | POA: Diagnosis not present

## 2024-03-03 DIAGNOSIS — R32 Unspecified urinary incontinence: Secondary | ICD-10-CM | POA: Diagnosis not present

## 2024-03-03 DIAGNOSIS — N1832 Chronic kidney disease, stage 3b: Secondary | ICD-10-CM | POA: Diagnosis not present

## 2024-03-03 DIAGNOSIS — M1712 Unilateral primary osteoarthritis, left knee: Secondary | ICD-10-CM | POA: Diagnosis not present

## 2024-03-03 DIAGNOSIS — H3530 Unspecified macular degeneration: Secondary | ICD-10-CM | POA: Diagnosis not present

## 2024-03-03 DIAGNOSIS — E039 Hypothyroidism, unspecified: Secondary | ICD-10-CM | POA: Diagnosis not present

## 2024-03-03 DIAGNOSIS — I130 Hypertensive heart and chronic kidney disease with heart failure and stage 1 through stage 4 chronic kidney disease, or unspecified chronic kidney disease: Secondary | ICD-10-CM | POA: Diagnosis not present

## 2024-03-03 DIAGNOSIS — I5033 Acute on chronic diastolic (congestive) heart failure: Secondary | ICD-10-CM | POA: Diagnosis not present

## 2024-03-03 DIAGNOSIS — D631 Anemia in chronic kidney disease: Secondary | ICD-10-CM | POA: Diagnosis not present

## 2024-03-03 DIAGNOSIS — I10 Essential (primary) hypertension: Secondary | ICD-10-CM | POA: Diagnosis not present

## 2024-03-03 DIAGNOSIS — I4891 Unspecified atrial fibrillation: Secondary | ICD-10-CM | POA: Diagnosis not present

## 2024-03-03 DIAGNOSIS — E7800 Pure hypercholesterolemia, unspecified: Secondary | ICD-10-CM | POA: Diagnosis not present

## 2024-03-03 DIAGNOSIS — I5032 Chronic diastolic (congestive) heart failure: Secondary | ICD-10-CM | POA: Diagnosis not present

## 2024-03-03 DIAGNOSIS — E559 Vitamin D deficiency, unspecified: Secondary | ICD-10-CM | POA: Diagnosis not present

## 2024-03-03 DIAGNOSIS — D649 Anemia, unspecified: Secondary | ICD-10-CM | POA: Diagnosis not present

## 2024-03-09 DIAGNOSIS — H40023 Open angle with borderline findings, high risk, bilateral: Secondary | ICD-10-CM | POA: Diagnosis not present

## 2024-03-09 DIAGNOSIS — H40052 Ocular hypertension, left eye: Secondary | ICD-10-CM | POA: Diagnosis not present

## 2024-03-13 DIAGNOSIS — I5032 Chronic diastolic (congestive) heart failure: Secondary | ICD-10-CM | POA: Diagnosis not present

## 2024-03-13 DIAGNOSIS — N1832 Chronic kidney disease, stage 3b: Secondary | ICD-10-CM | POA: Diagnosis not present

## 2024-03-13 DIAGNOSIS — Z466 Encounter for fitting and adjustment of urinary device: Secondary | ICD-10-CM | POA: Diagnosis not present

## 2024-03-13 DIAGNOSIS — D631 Anemia in chronic kidney disease: Secondary | ICD-10-CM | POA: Diagnosis not present

## 2024-03-13 DIAGNOSIS — I130 Hypertensive heart and chronic kidney disease with heart failure and stage 1 through stage 4 chronic kidney disease, or unspecified chronic kidney disease: Secondary | ICD-10-CM | POA: Diagnosis not present

## 2024-03-13 DIAGNOSIS — M1990 Unspecified osteoarthritis, unspecified site: Secondary | ICD-10-CM | POA: Diagnosis not present

## 2024-03-19 DIAGNOSIS — I5032 Chronic diastolic (congestive) heart failure: Secondary | ICD-10-CM | POA: Diagnosis not present

## 2024-03-19 DIAGNOSIS — N1832 Chronic kidney disease, stage 3b: Secondary | ICD-10-CM | POA: Diagnosis not present

## 2024-03-19 DIAGNOSIS — I10 Essential (primary) hypertension: Secondary | ICD-10-CM | POA: Diagnosis not present

## 2024-03-19 DIAGNOSIS — I130 Hypertensive heart and chronic kidney disease with heart failure and stage 1 through stage 4 chronic kidney disease, or unspecified chronic kidney disease: Secondary | ICD-10-CM | POA: Diagnosis not present

## 2024-03-20 DIAGNOSIS — N1832 Chronic kidney disease, stage 3b: Secondary | ICD-10-CM | POA: Diagnosis not present

## 2024-03-20 DIAGNOSIS — I10 Essential (primary) hypertension: Secondary | ICD-10-CM | POA: Diagnosis not present

## 2024-03-20 DIAGNOSIS — M1611 Unilateral primary osteoarthritis, right hip: Secondary | ICD-10-CM | POA: Diagnosis not present

## 2024-03-20 DIAGNOSIS — I130 Hypertensive heart and chronic kidney disease with heart failure and stage 1 through stage 4 chronic kidney disease, or unspecified chronic kidney disease: Secondary | ICD-10-CM | POA: Diagnosis not present

## 2024-03-20 DIAGNOSIS — I5032 Chronic diastolic (congestive) heart failure: Secondary | ICD-10-CM | POA: Diagnosis not present

## 2024-03-20 DIAGNOSIS — H409 Unspecified glaucoma: Secondary | ICD-10-CM | POA: Diagnosis not present

## 2024-03-23 DIAGNOSIS — I5032 Chronic diastolic (congestive) heart failure: Secondary | ICD-10-CM | POA: Diagnosis not present

## 2024-03-23 DIAGNOSIS — Z466 Encounter for fitting and adjustment of urinary device: Secondary | ICD-10-CM | POA: Diagnosis not present

## 2024-03-23 DIAGNOSIS — N1832 Chronic kidney disease, stage 3b: Secondary | ICD-10-CM | POA: Diagnosis not present

## 2024-03-23 DIAGNOSIS — I130 Hypertensive heart and chronic kidney disease with heart failure and stage 1 through stage 4 chronic kidney disease, or unspecified chronic kidney disease: Secondary | ICD-10-CM | POA: Diagnosis not present

## 2024-03-23 DIAGNOSIS — D631 Anemia in chronic kidney disease: Secondary | ICD-10-CM | POA: Diagnosis not present

## 2024-03-23 DIAGNOSIS — M1990 Unspecified osteoarthritis, unspecified site: Secondary | ICD-10-CM | POA: Diagnosis not present

## 2024-03-29 DIAGNOSIS — D631 Anemia in chronic kidney disease: Secondary | ICD-10-CM | POA: Diagnosis not present

## 2024-03-29 DIAGNOSIS — N184 Chronic kidney disease, stage 4 (severe): Secondary | ICD-10-CM | POA: Diagnosis not present

## 2024-03-29 DIAGNOSIS — I5032 Chronic diastolic (congestive) heart failure: Secondary | ICD-10-CM | POA: Diagnosis not present

## 2024-03-29 DIAGNOSIS — R0602 Shortness of breath: Secondary | ICD-10-CM | POA: Diagnosis not present

## 2024-03-29 DIAGNOSIS — M1990 Unspecified osteoarthritis, unspecified site: Secondary | ICD-10-CM | POA: Diagnosis not present

## 2024-03-29 DIAGNOSIS — R059 Cough, unspecified: Secondary | ICD-10-CM | POA: Diagnosis not present

## 2024-03-29 DIAGNOSIS — Z466 Encounter for fitting and adjustment of urinary device: Secondary | ICD-10-CM | POA: Diagnosis not present

## 2024-03-29 DIAGNOSIS — I130 Hypertensive heart and chronic kidney disease with heart failure and stage 1 through stage 4 chronic kidney disease, or unspecified chronic kidney disease: Secondary | ICD-10-CM | POA: Diagnosis not present

## 2024-03-29 DIAGNOSIS — N1832 Chronic kidney disease, stage 3b: Secondary | ICD-10-CM | POA: Diagnosis not present

## 2024-03-30 DIAGNOSIS — E039 Hypothyroidism, unspecified: Secondary | ICD-10-CM | POA: Diagnosis not present

## 2024-03-30 DIAGNOSIS — M1990 Unspecified osteoarthritis, unspecified site: Secondary | ICD-10-CM | POA: Diagnosis not present

## 2024-03-30 DIAGNOSIS — Z466 Encounter for fitting and adjustment of urinary device: Secondary | ICD-10-CM | POA: Diagnosis not present

## 2024-03-30 DIAGNOSIS — E785 Hyperlipidemia, unspecified: Secondary | ICD-10-CM | POA: Diagnosis not present

## 2024-03-30 DIAGNOSIS — N1832 Chronic kidney disease, stage 3b: Secondary | ICD-10-CM | POA: Diagnosis not present

## 2024-03-30 DIAGNOSIS — K5900 Constipation, unspecified: Secondary | ICD-10-CM | POA: Diagnosis not present

## 2024-03-30 DIAGNOSIS — Z87440 Personal history of urinary (tract) infections: Secondary | ICD-10-CM | POA: Diagnosis not present

## 2024-03-30 DIAGNOSIS — I5032 Chronic diastolic (congestive) heart failure: Secondary | ICD-10-CM | POA: Diagnosis not present

## 2024-03-30 DIAGNOSIS — H409 Unspecified glaucoma: Secondary | ICD-10-CM | POA: Diagnosis not present

## 2024-03-30 DIAGNOSIS — Z8701 Personal history of pneumonia (recurrent): Secondary | ICD-10-CM | POA: Diagnosis not present

## 2024-03-30 DIAGNOSIS — D631 Anemia in chronic kidney disease: Secondary | ICD-10-CM | POA: Diagnosis not present

## 2024-03-30 DIAGNOSIS — I130 Hypertensive heart and chronic kidney disease with heart failure and stage 1 through stage 4 chronic kidney disease, or unspecified chronic kidney disease: Secondary | ICD-10-CM | POA: Diagnosis not present

## 2024-03-31 DIAGNOSIS — D631 Anemia in chronic kidney disease: Secondary | ICD-10-CM | POA: Diagnosis not present

## 2024-03-31 DIAGNOSIS — I5032 Chronic diastolic (congestive) heart failure: Secondary | ICD-10-CM | POA: Diagnosis not present

## 2024-03-31 DIAGNOSIS — N1832 Chronic kidney disease, stage 3b: Secondary | ICD-10-CM | POA: Diagnosis not present

## 2024-03-31 DIAGNOSIS — M1990 Unspecified osteoarthritis, unspecified site: Secondary | ICD-10-CM | POA: Diagnosis not present

## 2024-03-31 DIAGNOSIS — I130 Hypertensive heart and chronic kidney disease with heart failure and stage 1 through stage 4 chronic kidney disease, or unspecified chronic kidney disease: Secondary | ICD-10-CM | POA: Diagnosis not present

## 2024-03-31 DIAGNOSIS — Z466 Encounter for fitting and adjustment of urinary device: Secondary | ICD-10-CM | POA: Diagnosis not present

## 2024-04-04 ENCOUNTER — Telehealth: Payer: Self-pay | Admitting: Cardiology

## 2024-04-04 DIAGNOSIS — I10 Essential (primary) hypertension: Secondary | ICD-10-CM

## 2024-04-04 DIAGNOSIS — N1832 Chronic kidney disease, stage 3b: Secondary | ICD-10-CM | POA: Diagnosis not present

## 2024-04-04 DIAGNOSIS — I5032 Chronic diastolic (congestive) heart failure: Secondary | ICD-10-CM | POA: Diagnosis not present

## 2024-04-04 DIAGNOSIS — I130 Hypertensive heart and chronic kidney disease with heart failure and stage 1 through stage 4 chronic kidney disease, or unspecified chronic kidney disease: Secondary | ICD-10-CM | POA: Diagnosis not present

## 2024-04-04 NOTE — Telephone Encounter (Signed)
 Pt c/o swelling/edema: STAT if pt has developed SOB within 24 hours  If swelling, where is the swelling located? Some swelling in legs and ankles but not enough to explain weight gain  How much weight have you gained and in what time span? 6 pounds in 5 days  Have you gained 2 pounds in a day or 5 pounds in a week? Yes  Do you have a log of your daily weights (if so, list)?  177 - 9/9 171 - 9/11 173 - 9/13 177 - 9/15 this morning  Are you currently taking a fluid pill? Yes  Are you currently SOB? No - but has been experiencing shortness of breath  Have you traveled recently in a car or plane for an extended period of time? No  Increased lasix  dosage on 9/9-9/11, then went back down to 40 mg on 9/12   [facility] calling on behalf of pt

## 2024-04-04 NOTE — Telephone Encounter (Signed)
 Spoke with daughter Mrs. Swing. Per dr. Krasowski- She should take extra 40 mg of Lasix  today so it will be total 80, and then continue for 60 mg for next 2 days of Lasix , after that return to 40 mg daily, if shortness of breath does not get better need to go to the emergency room Chem-7 need to be checked before week is over - she verbalized understanding and had no further questions

## 2024-04-05 DIAGNOSIS — I130 Hypertensive heart and chronic kidney disease with heart failure and stage 1 through stage 4 chronic kidney disease, or unspecified chronic kidney disease: Secondary | ICD-10-CM | POA: Diagnosis not present

## 2024-04-05 DIAGNOSIS — N1832 Chronic kidney disease, stage 3b: Secondary | ICD-10-CM | POA: Diagnosis not present

## 2024-04-05 DIAGNOSIS — D631 Anemia in chronic kidney disease: Secondary | ICD-10-CM | POA: Diagnosis not present

## 2024-04-05 DIAGNOSIS — M1990 Unspecified osteoarthritis, unspecified site: Secondary | ICD-10-CM | POA: Diagnosis not present

## 2024-04-05 DIAGNOSIS — Z466 Encounter for fitting and adjustment of urinary device: Secondary | ICD-10-CM | POA: Diagnosis not present

## 2024-04-05 DIAGNOSIS — I5032 Chronic diastolic (congestive) heart failure: Secondary | ICD-10-CM | POA: Diagnosis not present

## 2024-04-05 DIAGNOSIS — I10 Essential (primary) hypertension: Secondary | ICD-10-CM | POA: Diagnosis not present

## 2024-04-06 LAB — BASIC METABOLIC PANEL WITH GFR
BUN/Creatinine Ratio: 31 — ABNORMAL HIGH (ref 12–28)
BUN: 43 mg/dL — ABNORMAL HIGH (ref 10–36)
CO2: 21 mmol/L (ref 20–29)
Calcium: 9.8 mg/dL (ref 8.7–10.3)
Chloride: 101 mmol/L (ref 96–106)
Creatinine, Ser: 1.37 mg/dL — ABNORMAL HIGH (ref 0.57–1.00)
Glucose: 100 mg/dL — ABNORMAL HIGH (ref 70–99)
Potassium: 4.1 mmol/L (ref 3.5–5.2)
Sodium: 138 mmol/L (ref 134–144)
eGFR: 35 mL/min/1.73 — ABNORMAL LOW (ref >59)

## 2024-04-07 ENCOUNTER — Emergency Department (HOSPITAL_COMMUNITY)

## 2024-04-07 ENCOUNTER — Ambulatory Visit: Payer: Self-pay | Admitting: Cardiology

## 2024-04-07 ENCOUNTER — Emergency Department (HOSPITAL_COMMUNITY)
Admission: EM | Admit: 2024-04-07 | Discharge: 2024-04-07 | Disposition: A | Discharge status: Home / Self Care | Attending: Emergency Medicine | Admitting: Emergency Medicine

## 2024-04-07 ENCOUNTER — Other Ambulatory Visit: Payer: Self-pay

## 2024-04-07 ENCOUNTER — Encounter (HOSPITAL_COMMUNITY): Payer: Self-pay

## 2024-04-07 DIAGNOSIS — N1832 Chronic kidney disease, stage 3b: Secondary | ICD-10-CM | POA: Diagnosis not present

## 2024-04-07 DIAGNOSIS — H409 Unspecified glaucoma: Secondary | ICD-10-CM | POA: Diagnosis not present

## 2024-04-07 DIAGNOSIS — R079 Chest pain, unspecified: Secondary | ICD-10-CM

## 2024-04-07 DIAGNOSIS — R600 Localized edema: Secondary | ICD-10-CM | POA: Insufficient documentation

## 2024-04-07 DIAGNOSIS — I2510 Atherosclerotic heart disease of native coronary artery without angina pectoris: Secondary | ICD-10-CM | POA: Diagnosis not present

## 2024-04-07 DIAGNOSIS — R0989 Other specified symptoms and signs involving the circulatory and respiratory systems: Secondary | ICD-10-CM | POA: Diagnosis not present

## 2024-04-07 DIAGNOSIS — R072 Precordial pain: Secondary | ICD-10-CM | POA: Insufficient documentation

## 2024-04-07 DIAGNOSIS — R918 Other nonspecific abnormal finding of lung field: Secondary | ICD-10-CM | POA: Diagnosis not present

## 2024-04-07 DIAGNOSIS — Z7901 Long term (current) use of anticoagulants: Secondary | ICD-10-CM | POA: Insufficient documentation

## 2024-04-07 DIAGNOSIS — I10 Essential (primary) hypertension: Secondary | ICD-10-CM | POA: Diagnosis not present

## 2024-04-07 DIAGNOSIS — M1611 Unilateral primary osteoarthritis, right hip: Secondary | ICD-10-CM | POA: Diagnosis not present

## 2024-04-07 DIAGNOSIS — Z471 Aftercare following joint replacement surgery: Secondary | ICD-10-CM | POA: Diagnosis not present

## 2024-04-07 DIAGNOSIS — R0789 Other chest pain: Secondary | ICD-10-CM | POA: Diagnosis not present

## 2024-04-07 DIAGNOSIS — J90 Pleural effusion, not elsewhere classified: Secondary | ICD-10-CM | POA: Diagnosis not present

## 2024-04-07 DIAGNOSIS — I3139 Other pericardial effusion (noninflammatory): Secondary | ICD-10-CM | POA: Diagnosis not present

## 2024-04-07 DIAGNOSIS — Z96611 Presence of right artificial shoulder joint: Secondary | ICD-10-CM | POA: Diagnosis not present

## 2024-04-07 LAB — CBC
HCT: 34.7 % — ABNORMAL LOW (ref 36.0–46.0)
Hemoglobin: 11 g/dL — ABNORMAL LOW (ref 12.0–15.0)
MCH: 30.5 pg (ref 26.0–34.0)
MCHC: 31.7 g/dL (ref 30.0–36.0)
MCV: 96.1 fL (ref 80.0–100.0)
Platelets: 136 K/uL — ABNORMAL LOW (ref 150–400)
RBC: 3.61 MIL/uL — ABNORMAL LOW (ref 3.87–5.11)
RDW: 15.5 % (ref 11.5–15.5)
WBC: 8.3 K/uL (ref 4.0–10.5)
nRBC: 0 % (ref 0.0–0.2)

## 2024-04-07 LAB — TROPONIN I (HIGH SENSITIVITY)
Troponin I (High Sensitivity): 8 ng/L (ref <18)
Troponin I (High Sensitivity): 7 ng/L (ref <18)

## 2024-04-07 LAB — CBG MONITORING, ED: Glucose-Capillary: 112 mg/dL — ABNORMAL HIGH (ref 70–99)

## 2024-04-07 LAB — BASIC METABOLIC PANEL WITH GFR
Anion gap: 13 (ref 5–15)
BUN: 43 mg/dL — ABNORMAL HIGH (ref 8–23)
CO2: 23 mmol/L (ref 22–32)
Calcium: 9.3 mg/dL (ref 8.9–10.3)
Chloride: 99 mmol/L (ref 98–111)
Creatinine, Ser: 1.2 mg/dL — ABNORMAL HIGH (ref 0.44–1.00)
GFR, Estimated: 41 mL/min — ABNORMAL LOW (ref >60)
Glucose, Bld: 92 mg/dL (ref 70–99)
Potassium: 4 mmol/L (ref 3.5–5.1)
Sodium: 135 mmol/L (ref 135–145)

## 2024-04-07 MED ORDER — ASPIRIN 81 MG PO CHEW
324.0000 mg | CHEWABLE_TABLET | Freq: Once | ORAL | Status: DC
Start: 2024-04-07 — End: 2024-04-07

## 2024-04-07 MED ORDER — ASPIRIN 81 MG PO CHEW: 324.0000 mg | CHEWABLE_TABLET | Freq: Once | ORAL | Status: DC

## 2024-04-07 MED ORDER — PANTOPRAZOLE SODIUM 40 MG IV SOLR
40.0000 mg | Freq: Once | INTRAVENOUS | Status: AC
  Administered 2024-04-07: 40 mg via INTRAVENOUS
  Filled 2024-04-07: qty 10

## 2024-04-07 MED ORDER — IOHEXOL 350 MG/ML SOLN
75.0000 mL | Freq: Once | INTRAVENOUS | Status: AC | PRN
  Administered 2024-04-07: 75 mL via INTRAVENOUS

## 2024-04-07 MED ORDER — PANTOPRAZOLE SODIUM 40 MG PO TBEC: 40.0000 mg | DELAYED_RELEASE_TABLET | Freq: Every day | ORAL | 0 refills | Status: DC

## 2024-04-07 NOTE — ED Triage Notes (Signed)
 Pt to ED via EMS with c/o left-sided CP/rib pain since approx 1800 last night.  Pt states the pain is sharp and constant exacerbated with inspiratory breaths. No radiation of pain. Hx afib. Pt breathing even and unlabored. Pt A&Ox4. Pt took 324mg  aspirin  pta.

## 2024-04-07 NOTE — ED Provider Notes (Signed)
 Signout from Dr..  89 year old female here with chest pain since last evening.  Initial troponin unremarkable and CT angio negative.  Feeling somewhat better after PPI.  Plan is to follow-up on second troponin.  If negative likely can be discharged Physical Exam  BP 100/70 (BP Location: Right Arm)   Pulse (!) 107   Temp 98.9 F (37.2 C) (Oral)   Resp 18   Ht 5' 2 (1.575 m)   Wt 81 kg   SpO2 96%   BMI 32.66 kg/m   Physical Exam  Procedures  Procedures  ED Course / MDM   Clinical Course as of 04/07/24 1553  Thu Apr 07, 2024  1157 CBC(!) Hemoglobin stable compared to previous values [JK]  1224 CXR without acute abnormality [JK]  1351 CT angiogram without evidence of pulmonary embolism.  Minimal atelectasis/edema noted.  Coronary artery calcifications noted [JK]    Clinical Course User Index [JK] Randol Simmonds, MD   Medical Decision Making Amount and/or Complexity of Data Reviewed Labs: ordered. Decision-making details documented in ED Course. Radiology: ordered.  Risk Prescription drug management.   4 PM.  Patient's second troponin is flat.  I reviewed this with her and her family and she is comfortable plan for discharge.  She said she is feeling better.  Family asked if we could prescribe the acid medication to her       Towana Ozell BROCKS, MD 04/09/2024 [phone]

## 2024-04-07 NOTE — ED Provider Notes (Signed)
 [place] EMERGENCY DEPARTMENT AT [facility] Provider Note   CSN: [id number] Arrival date & time: 04/07/24  9090     Patient presents with: Chest Pain   Carla Taylor is a 89 y.o. female.  {Add pertinent medical, surgical, social history, OB history to HPI:32947}  Chest Pain Pain location:  Substernal area and L chest Pain quality comment:  Initially sharp now aching, dull Onset quality:  Sudden (while crocheting) Timing:  Constant Chronicity:  New Worsened by:  Nothing Ineffective treatments:  None tried Associated symptoms: no abdominal pain, no cough, no dysphagia, no fever, no heartburn and no shortness of breath   Risk factors: no coronary artery disease and no prior DVT/PE   Patient states she started having chest pain last night.  Initially was sharp in the center and left side of her chest.  She tried walking around but the symptoms persisted.  She denied any increased pain with breathing to me although triage notes indicate exacerbated with inspiratory breaths     Prior to Admission medications   Medication Sig Start Date End Date Taking? Authorizing Provider  acetaminophen  (TYLENOL ) 650 MG CR tablet Take 1,300 mg by mouth every 6 (six) hours as needed for pain.    [provider]  apixaban  (ELIQUIS ) 5 MG TABS tablet Take 1 tablet (5 mg total) by mouth 2 (two) times daily. 01/29/24   Regalado, Belkys A, MD  BESIVANCE 0.6 % SUSP Place 4 drops into both eyes See admin instructions. 4 drops before shot & 4 drops after shot 12/04/23   [provider]  Calcium  Carb-Cholecalciferol (CALCIUM  600 + D PO) Take 600 mg by mouth in the morning and at bedtime.    [provider]  dorzolamide -timolol  (COSOPT ) 22.3-6.8 MG/ML ophthalmic solution Place 1 drop into both eyes 2 (two) times daily. 01/15/22   [provider]  doxazosin  (CARDURA ) 2 MG tablet TAKE 1 TABLET BY MOUTH  DAILY  PLEASE OBTAIN FUTURE  REFILLS FROM PCP. Patient taking  differently: Take 2 mg by mouth daily. 02/19/16   Pietro Redell RAMAN, MD  furosemide  (LASIX ) 40 MG tablet Take 1 tablet (40 mg total) by mouth daily. 02/19/24   Ghimire, Kuber, MD  latanoprost  (XALATAN ) 0.005 % ophthalmic solution Place 1 drop into the left eye at bedtime.    [provider]  levothyroxine  (SYNTHROID ) 100 MCG tablet Take 100 mcg by mouth daily. 01/27/24   [provider]  metoprolol  succinate (TOPROL -XL) 100 MG 24 hr tablet Take 1 tablet (100 mg total) by mouth daily. Take with or immediately following a meal. 01/29/24   Regalado, Belkys A, MD  Multiple Vitamins-Minerals (ICAPS AREDS FORMULA PO) Take 1 tablet by mouth 2 (two) times daily.    [provider]  Polyethyl Glycol-Propyl Glycol (SYSTANE OP) Place 1 drop into both eyes as needed (dryness/itching).    [provider]  potassium chloride  SA (KLOR-CON  M) 20 MEQ tablet Take 1 tablet (20 mEq total) by mouth daily. 01/29/24 04/28/24  Regalado, Belkys A, MD  sennosides-docusate sodium  (SENOKOT-S) 8.6-50 MG tablet Take 1 tablet by mouth daily as needed for constipation.    [provider]  vitamin B-12 (CYANOCOBALAMIN ) 500 MCG tablet Take 500 mcg by mouth daily.    [provider]  Vitamin D , Ergocalciferol , (DRISDOL ) 50000 UNITS CAPS capsule Take 50,000 Units by mouth every Sunday.    [provider]    Allergies: Patient has no known allergies.    Review of Systems  Constitutional:  Negative for fever.  HENT:  Negative for trouble swallowing.   Respiratory:  Negative for cough and shortness of breath.   Cardiovascular:  Positive for chest pain.  Gastrointestinal:  Negative for abdominal pain and heartburn.    Updated Vital Signs BP 97/61 (BP Location: Right Arm)   Pulse (!) 101   Temp 97.8 F (36.6 C) (Oral)   Resp (!) 24   Ht 1.575 m (5' 2)   Wt 81 kg   SpO2 97%   BMI 32.66 kg/m   Physical Exam Vitals and nursing note reviewed.  Constitutional:       Appearance: She is well-developed. She is not diaphoretic.  HENT:     Head: Normocephalic and atraumatic.     Right Ear: External ear normal.     Left Ear: External ear normal.  Eyes:     General: No scleral icterus.       Right eye: No discharge.        Left eye: No discharge.     Conjunctiva/sclera: Conjunctivae normal.  Neck:     Trachea: No tracheal deviation.  Cardiovascular:     Rate and Rhythm: Normal rate and regular rhythm.  Pulmonary:     Effort: Pulmonary effort is normal. No respiratory distress.     Breath sounds: Normal breath sounds. No stridor. No wheezing or rales.  Abdominal:     General: Bowel sounds are normal. There is no distension or abdominal bruit.     Palpations: Abdomen is soft.     Tenderness: There is no abdominal tenderness. There is no guarding or rebound.  Musculoskeletal:        General: No tenderness or deformity.     Cervical back: Neck supple.     Comments: Mild edema lower extremities, no ttp, no erythema  Skin:    General: Skin is warm and dry.     Findings: No rash.  Neurological:     General: No focal deficit present.     Mental Status: She is alert.     Cranial Nerves: No cranial nerve deficit, dysarthria or facial asymmetry.     Sensory: No sensory deficit.     Motor: No abnormal muscle tone or seizure activity.     Coordination: Coordination normal.  Psychiatric:        Mood and Affect: Mood normal.     (all labs ordered are listed, but only abnormal results are displayed) Labs Reviewed  BASIC METABOLIC PANEL WITH GFR  CBC  CBG MONITORING, ED  TROPONIN I (HIGH SENSITIVITY)    EKG: None  Radiology: No results found.  {Document cardiac monitor, telemetry assessment procedure when appropriate:32947} Procedures   Medications Ordered in the ED  pantoprazole  (PROTONIX ) injection 40 mg (has no administration in time range)      {Click here for ABCD2, HEART and other calculators REFRESH Note before signing:1}                               Medical Decision Making Differential diagnose includes but not limited to acute coronary syndrome, pneumonia pneumothorax pulmonary embolism  Amount and/or Complexity of Data Reviewed Labs: ordered. Radiology: ordered.  Risk Prescription drug management.   ***  {Document critical care time when appropriate  Document review of labs and clinical decision tools ie CHADS2VASC2, etc  Document your independent review of radiology images and any outside records  Document your discussion with family members, caretakers  and with consultants  Document social determinants of health affecting pt's care  Document your decision making why or why not admission, treatments were needed:32947:::1}   Final diagnoses:  None    ED Discharge Orders     None

## 2024-04-07 NOTE — Discharge Instructions (Signed)
 You are seen in the emergency department for an episode of chest pain.  You had blood work EKG chest x-ray and a CAT scan of your chest that did not show any evidence of heart injury.  You were feeling better after some acid medication and so we are prescribing you some medication for home.  Please follow-up with your primary care doctor.  Return to the emergency department if worsening or concerning symptoms.

## 2024-04-08 DIAGNOSIS — I469 Cardiac arrest, cause unspecified: Secondary | ICD-10-CM | POA: Diagnosis not present

## 2024-04-08 DIAGNOSIS — R079 Chest pain, unspecified: Secondary | ICD-10-CM | POA: Diagnosis not present

## 2024-04-08 DIAGNOSIS — R0602 Shortness of breath: Secondary | ICD-10-CM | POA: Diagnosis not present

## 2024-04-20 DEATH — deceased

## 2024-05-17 ENCOUNTER — Ambulatory Visit

## 2024-05-17 ENCOUNTER — Ambulatory Visit: Admitting: Physician Assistant

## 2024-05-19 ENCOUNTER — Ambulatory Visit: Admitting: Podiatry

## 2024-06-01 ENCOUNTER — Encounter (INDEPENDENT_AMBULATORY_CARE_PROVIDER_SITE_OTHER): Admitting: Ophthalmology
